# Patient Record
Sex: Male | Born: 1980 | Race: White | Hispanic: Yes | Marital: Single
Health system: Southern US, Academic
[De-identification: ages and names within clinical notes are randomized; demographics above are authoritative.]

## PROBLEM LIST (undated history)

## (undated) ENCOUNTER — Encounter

## (undated) ENCOUNTER — Telehealth

## (undated) ENCOUNTER — Encounter: Attending: Gastroenterology | Primary: Gastroenterology

## (undated) ENCOUNTER — Ambulatory Visit

## (undated) ENCOUNTER — Telehealth: Attending: Gastroenterology | Primary: Gastroenterology

## (undated) ENCOUNTER — Ambulatory Visit: Payer: MEDICARE | Attending: Gastroenterology | Primary: Gastroenterology

## (undated) ENCOUNTER — Ambulatory Visit: Attending: Audiologist | Primary: Audiologist

## (undated) ENCOUNTER — Other Ambulatory Visit

## (undated) ENCOUNTER — Ambulatory Visit: Payer: MEDICARE

## (undated) ENCOUNTER — Inpatient Hospital Stay

## (undated) ENCOUNTER — Ambulatory Visit: Attending: Physician Assistant | Primary: Physician Assistant

## (undated) DIAGNOSIS — K5090 Crohn's disease, unspecified, without complications: Secondary | ICD-10-CM

## (undated) DIAGNOSIS — G894 Chronic pain syndrome: Secondary | ICD-10-CM

## (undated) HISTORY — PX: ABDOMINAL SURGERY: SHX537

## (undated) HISTORY — DX: Chronic pain syndrome: G89.4

## (undated) HISTORY — PX: OTHER SURGICAL HISTORY: SHX169

---
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
## 2002-03-02 ENCOUNTER — Encounter: Payer: Self-pay | Admitting: *Deleted

## 2002-03-02 ENCOUNTER — Emergency Department (HOSPITAL_COMMUNITY): Admission: EM | Admit: 2002-03-02 | Discharge: 2002-03-02 | Payer: Self-pay | Admitting: *Deleted

## 2005-01-09 ENCOUNTER — Encounter: Admission: RE | Admit: 2005-01-09 | Discharge: 2005-01-09 | Payer: Self-pay | Admitting: Oncology

## 2005-01-09 ENCOUNTER — Ambulatory Visit (HOSPITAL_COMMUNITY): Payer: Self-pay | Admitting: Oncology

## 2005-01-09 ENCOUNTER — Encounter (HOSPITAL_COMMUNITY): Admission: RE | Admit: 2005-01-09 | Discharge: 2005-02-08 | Payer: Self-pay | Admitting: Oncology

## 2005-01-22 ENCOUNTER — Ambulatory Visit: Payer: Self-pay | Admitting: Internal Medicine

## 2005-01-29 ENCOUNTER — Ambulatory Visit: Payer: Self-pay | Admitting: Internal Medicine

## 2005-01-29 ENCOUNTER — Encounter: Payer: Self-pay | Admitting: Internal Medicine

## 2005-01-29 ENCOUNTER — Ambulatory Visit (HOSPITAL_COMMUNITY): Admission: RE | Admit: 2005-01-29 | Discharge: 2005-01-29 | Payer: Self-pay | Admitting: Internal Medicine

## 2005-01-30 ENCOUNTER — Ambulatory Visit (HOSPITAL_COMMUNITY): Admission: RE | Admit: 2005-01-30 | Discharge: 2005-01-30 | Payer: Self-pay | Admitting: Internal Medicine

## 2005-02-27 ENCOUNTER — Ambulatory Visit (HOSPITAL_COMMUNITY): Admission: RE | Admit: 2005-02-27 | Discharge: 2005-02-27 | Payer: Self-pay | Admitting: Internal Medicine

## 2005-03-05 ENCOUNTER — Ambulatory Visit: Payer: Self-pay | Admitting: Internal Medicine

## 2005-05-01 ENCOUNTER — Ambulatory Visit (HOSPITAL_COMMUNITY): Admission: RE | Admit: 2005-05-01 | Discharge: 2005-05-01 | Payer: Self-pay | Admitting: Internal Medicine

## 2005-05-01 ENCOUNTER — Ambulatory Visit: Payer: Self-pay | Admitting: Internal Medicine

## 2005-05-14 ENCOUNTER — Encounter (HOSPITAL_COMMUNITY): Admission: RE | Admit: 2005-05-14 | Discharge: 2005-06-13 | Payer: Self-pay | Admitting: Internal Medicine

## 2005-05-14 ENCOUNTER — Ambulatory Visit (HOSPITAL_COMMUNITY): Payer: Self-pay | Admitting: Internal Medicine

## 2005-05-22 ENCOUNTER — Ambulatory Visit: Payer: Self-pay | Admitting: Internal Medicine

## 2005-07-25 ENCOUNTER — Emergency Department (HOSPITAL_COMMUNITY): Admission: EM | Admit: 2005-07-25 | Discharge: 2005-07-25 | Payer: Self-pay | Admitting: Emergency Medicine

## 2005-07-26 ENCOUNTER — Encounter: Admission: RE | Admit: 2005-07-26 | Discharge: 2005-07-26 | Payer: Self-pay | Admitting: Gastroenterology

## 2005-08-06 ENCOUNTER — Encounter (HOSPITAL_COMMUNITY): Admission: RE | Admit: 2005-08-06 | Discharge: 2005-09-21 | Payer: Self-pay | Admitting: Gastroenterology

## 2009-09-29 ENCOUNTER — Emergency Department (HOSPITAL_COMMUNITY): Admission: EM | Admit: 2009-09-29 | Discharge: 2009-09-29 | Payer: Self-pay | Admitting: Family Medicine

## 2010-05-26 NOTE — Consult Note (Signed)
NAME:  Seth Foster, Seth Foster              ACCOUNT NO.:  [id number]   MEDICAL RECORD NO.:  [id number]          PATIENT TYPE:  AMB   LOCATION:                                FACILITY:  [facility]   PHYSICIAN:  R. Roetta Sessions, M.D. DATE OF BIRTH:  07-17-1980   DATE OF CONSULTATION:  01/22/2005  DATE OF DISCHARGE:                                   CONSULTATION   REASON FOR CONSULTATION:  Abdominal pain, rectal bleeding, weight loss.   HISTORY OF PRESENT ILLNESS:  Mr. Seth Foster is a 30 year old Caucasian  male sent over through the courtesy of Seth Foster to further evaluate the  above-mentioned symptoms.  Mr. Seth Foster says for many years he has had  intermittent postprandial abdominal discomforts, periumbilical and  epigastric in location, not necessarily related to meals, but often this is  the case.  He is chronically constipated, having 1 bowel movement daily to  every 3 to 4 days.  He is intermittently passing fresh blood per rectum with  stooling.  He has noted unintentional weight loss, approximately 20 pounds  over the past couple of months.  He has intermittent nausea and vomiting as  well.  He does not really have much in the way of typical reflux symptoms,  no odynophagia, no dysphagia.  He does clearly describe progressing early  satiety.   He saw Dr. Mariel Foster recently for leukocytosis, mild anemia, and elevated  platelets.  According to Seth Foster notes, etiology not clear, but  further workup was in process.  Through Dr. Lamar Foster office, accompanying  records indicate that he was checked with a celiac panel.  His gliadin  peptide antibody came back 26 which was weakly positive.  His endomysial  antibody was negative.  Unfortunately, transglutaminase antibody was not  reported.  Amylase and lipase were not elevated.  TSH was normal at 0.2387.  H. pylori serology came back 0.7, normal.  He has not had any imaging  studies of his GI tract.  In addition, his white count was  mildly elevated  at 12,000, hemoglobin 11.9, hematocrit 38.2.  His CHEM-20 was okay except  for slightly depressed glucose at 68.  All of his liver parameters were  normal.   Mr. Seth Foster tells me he smokes marijuana on a regular basis which relieves  the nausea, but he feels it just covers up the symptoms.   HIV testing per Seth Foster office came back negative per his report.  He drinks a good 4 to 5 cocktails every weekend.  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  He did this for a  prolonged period some 2 to 2-1/2 years ago but has stopped this behavior.  He denies any parenteral drug use. He has never had any GI surgery and has  never seen a gastroenterologist previously.   PAST MEDICAL HISTORY:  Significant for asthma and the above-mentioned  current symptoms.   PAST SURGICAL HISTORY:  Bilateral tympanostomy tubes.   CURRENT MEDICATIONS:  None.   ALLERGIES:  No known drug allergies.   FAMILY HISTORY:  Mother is alive and in good health.  Little is known about  his biological father. No obvious history of chronic GI or liver disease.   SOCIAL HISTORY:  The patient is single with no children.  He is employed  with the [employer].  He smokes 1/2 to 1 pack of cigarettes  per day.  Liquor is as outlined above.  Marijuana use as above.  Prior  history of nasal drug use as outlined above.   REVIEW OF SYSTEMS:  No recent chest pain or dyspnea on exertion.  No fever,  chills, weight loss as outlined above.  Otherwise as in GI Review of  Systems.   PHYSICAL EXAMINATION:  GENERAL:  19, 30 year old, bearded gentleman  resting comfortably.  VITAL SIGNS: Weight 135.5, height 5 feet 9 inches.  Temperature 97.8, blood  pressure 100/56, pulse 80.  SKIN: Warm and dry.  There is no jaundice, no continuous stigmata of chronic  liver disease.  HEENT:   No scleral icterus.  Conjunctivae are pink.  Oral cavity and  dentition in fair state of repair.  NECK:  JVD is not prominent.  No cervical adenopathy.  CHEST:  Lungs are clear to auscultation.  CARDIAC: Regular rate and rhythm without murmur, gallop, or rub.  ABDOMEN: Nondistended, positive bowel sounds, soft.  Minimal epigastric  tenderness to palpation.  No appreciable mass or organomegaly.  EXTREMITIES: No edema.  RECTAL: No external lesions, good sphincter tone. No mass in the rectal  vault.  There is stool in the rectal vault.  Mucus is seen, occult negative.   ADMITTING IMPRESSION:  Mr. Seth Foster is a 30 year old  gentleman with a  constellation of gastrointestinal symptoms including early satiety,  postprandial abdominal pain, hematochezia in the setting of more or less  chronic constipation.  He has lost a good 20 pounds unintentionally  recently.  He has a weakly positive antigliadin antibody.   He has some hematological issues being looked into by Dr. Mariel Foster.   Given his small stature and weight loss, certainly need to rule out both  celiac disease and Crohn's disease in this setting. Although chronic  constipation would run somewhat contrary to Crohn's disease, we certainly  need to rule out that entity.  He has some hematochezia which may well be  anorectal in origin, but will need investigation of his entire lower  gastrointestinal tract.   RECOMMENDATIONS:  1.  Will go ahead and plan to proceed with EGD with small-bowel biopsy as      well as colonoscopy in the very near future.  2.  Will go ahead and order transglutaminase antibody to wrap up his      serological workup for celiac disease.  3.  Potential risks, benefits, and alternatives of the above approach have      been discussed at some length with Mr.      Seth Foster.  His questions were answered.  He is agreeable to proceed as      soon as possible. 4.  Further recommendations to follow.   I would like  to thank Dr. Colette Foster for allowing me to see this  nice gentleman today.      Seth Foster, M.D.  Electronically Signed     RMR/MEDQ  D:  01/22/2005  T:  01/22/2005  Job:  [id number]   cc:   Seth Foster, M.D.  Fax: [phone]   Ladona Horns. Seth Sleet, MD  Fax: [phone]

## 2010-05-26 NOTE — Op Note (Signed)
NAME:  Seth Foster, PUTZIER              ACCOUNT NO.:  [id number]   MEDICAL RECORD NO.:  [id number]          PATIENT TYPE:  AMB   LOCATION:  DAY                           FACILITY:  [facility]   PHYSICIAN:  R. Roetta Sessions, M.D. DATE OF BIRTH:  [birth year]   DATE OF PROCEDURE:  01/29/2005  DATE OF DISCHARGE:                                 OPERATIVE REPORT   PROCEDURE:  Esophagogastroduodenoscopy with biopsy followed by a colonoscopy  with ileoscopy and biopsy.   ENDOSCOPIST:  Gerrit Friends. Rourk, M.D.   INDICATIONS FOR PROCEDURE:  A 30 year old gentleman with early satiety,  postprandial abdominal pain, hematochezia.  He really has not had any  diarrhea.  He has been loosing weight.  Weakly positive, anti __________  antibody.  I saw him in the office recently and a trans __________ antibody  through my office came back negative.  EGD and colonoscopy are now being  down.  This approach has been discussed with the patient at length.  The  potential risks, benefits, and alternatives have been reviewed; and  questions answered.  Patient is agreeable.  Please see the documentation in  the medical record.   PROCEDURE NOTE:  O2 saturation, blood pressure, pulse and respirations were  monitored throughout the entire procedure.   CONSCIOUS SEDATION:  Versed 7 mg IV, Demerol 125 mg IV in divided doses,   INSTRUMENT:  Olympus videochip system.   FINDINGS:  Examination of the tubular esophagus revealed normal appearing  esophagus.  The EG junction was easily traversed.   STOMACH:  The gastric cavity was empty.  It insufflated well with air.  A  thorough examination of the gastric mucosa including a retroflex view of the  proximal stomach and esophagogastric junction demonstrated a small hiatal  hernia and a couple of scattered antral erosions.  The pylorus was patent  and easily traversed.  Examination of the bulb, second, and third portion  revealed multiple 6-mm, deep craters beginning in the second  portion,  extending as far as I could see through the third portion of the duodenum.  Please see photos.  These were discrete deep ulcers.  Intervening mucosa  appeared more or less normal.   THERAPEUTIC/DIAGNOSTIC MANEUVERS:  The small-bowel ulcers were biopsied.  The mucosa of the small bowel was biopsied to screen for celiac disease as  well.  The patient tolerated the procedure well and was prepared for  colonoscopy.   A digital rectal exam revealed no abnormalities.   ENDOSCOPIC FINDINGS:  The prep was adequate; however, I did suction out over  two liters of fluid throughout the colon.   RECTUM:  Examination of the rectal mucosa including a retroflex view of the  anal verge revealed anal papillae and internal hemorrhoids, otherwise the  rectal mucosa appeared normal.   COLON:  The colonic mucosa was surveyed from the rectosigmoid junction  through the left transverse and right colon to the area of the appendiceal  orifice, ileocecal valve, and cecum.  These structures were well seen and  photographed for the record.  The terminal ileum was intubated to 10 cm.  From this level the scope was slowly withdrawn.  All previously mentioned  mucosal surfaces were again seen.  The colonic mucosa appeared normal.  The  terminal ileal mucosa was markedly abnormal with diffuse ulceration of the  terminal ileal mucosa, as far as I could see, with some stenosis of the  lumen, please see photos.  Multiple biopsies of the terminal ileal mucosa  were taken.  The mucosa was friable. The mucosa came off in chunks.  The  patient tolerated both procedures well was reacted in endoscopy.   EGD IMPRESSION:  1.  Normal esophagus.  2.  Small hiatal hernia.  3.  A couple of antral erosions.  4.  Otherwise normal stomach.  5.  Patent pylorus.  6.  Numerous discrete ulcers of the second and third portion of the duodenum      status post biopsy.   COLONOSCOPY FINDINGS:  1.  Anal papillae and  internal hemorrhoids, otherwise normal rectum.  2.  Normal appearing colonic mucosa.  3.  Markedly abnormal, diffusely ulcerated, terminal ileal mucosa status      post biopsy.   Today's findings are most certainly going to be representative of Crohn's  disease.   RECOMMENDATIONS:  1.  Will followup on path.  2.  Will go ahead and obtain a small bowel follow through as a baseline.  3.  As far as the small volume hematochezia is concerned I suspect that he      is more or less bleeding from      hemorrhoids and we will give him some Anusol AC suppositories 1 per      rectum at bedtime.  4.  We will hold off on CT or further studies until the path comes back, but      I suspect, as stated above, that he has Crohn's disease and this would      certainly be consistent with the clinical scenario.      [name], M.D.  Electronically Signed     RMR/MEDQ  D:  01/29/2005  T:  01/29/2005  Job:  [id number]   cc:   Ladona Horns. Mariel Sleet, MD  Fax: [phone]   Corrie Mckusick, M.D.  Fax: [phone]

## 2011-02-07 DIAGNOSIS — Z980 Intestinal bypass and anastomosis status: Secondary | ICD-10-CM | POA: Insufficient documentation

## 2011-03-03 DIAGNOSIS — K219 Gastro-esophageal reflux disease without esophagitis: Secondary | ICD-10-CM | POA: Insufficient documentation

## 2011-03-07 DIAGNOSIS — Z720 Tobacco use: Secondary | ICD-10-CM | POA: Insufficient documentation

## 2011-06-04 ENCOUNTER — Emergency Department (HOSPITAL_COMMUNITY)
Admission: EM | Admit: 2011-06-04 | Discharge: 2011-06-04 | Disposition: A | Discharge status: Other Acute Inpatient Hospital | Payer: Self-pay | Attending: Emergency Medicine | Admitting: Emergency Medicine

## 2011-06-04 ENCOUNTER — Encounter (HOSPITAL_COMMUNITY): Payer: Self-pay | Admitting: *Deleted

## 2011-06-04 ENCOUNTER — Emergency Department (HOSPITAL_COMMUNITY): Payer: Self-pay

## 2011-06-04 DIAGNOSIS — J45909 Unspecified asthma, uncomplicated: Secondary | ICD-10-CM | POA: Insufficient documentation

## 2011-06-04 DIAGNOSIS — R109 Unspecified abdominal pain: Secondary | ICD-10-CM | POA: Insufficient documentation

## 2011-06-04 DIAGNOSIS — K56600 Partial intestinal obstruction, unspecified as to cause: Secondary | ICD-10-CM

## 2011-06-04 DIAGNOSIS — K56609 Unspecified intestinal obstruction, unspecified as to partial versus complete obstruction: Secondary | ICD-10-CM | POA: Insufficient documentation

## 2011-06-04 DIAGNOSIS — K5090 Crohn's disease, unspecified, without complications: Secondary | ICD-10-CM | POA: Insufficient documentation

## 2011-06-04 HISTORY — DX: Crohn's disease, unspecified, without complications: K50.90

## 2011-06-04 LAB — CBC
Hemoglobin: 12.1 g/dL — ABNORMAL LOW (ref 13.0–17.0)
MCV: 84.5 fL (ref 78.0–100.0)
Platelets: 356 10*3/uL (ref 150–400)
RBC: 4.39 MIL/uL (ref 4.22–5.81)
WBC: 15.2 10*3/uL — ABNORMAL HIGH (ref 4.0–10.5)

## 2011-06-04 LAB — DIFFERENTIAL
Basophils Absolute: 0.1 10*3/uL (ref 0.0–0.1)
Basophils Relative: 1 % (ref 0–1)
Eosinophils Absolute: 1.7 10*3/uL — ABNORMAL HIGH (ref 0.0–0.7)
Monocytes Absolute: 0.7 10*3/uL (ref 0.1–1.0)
Neutro Abs: 11 10*3/uL — ABNORMAL HIGH (ref 1.7–7.7)

## 2011-06-04 LAB — BASIC METABOLIC PANEL
BUN: 8 mg/dL (ref 6–23)
GFR calc Af Amer: >90 mL/min (ref >90)
GFR calc non Af Amer: >90 mL/min (ref >90)
Glucose, Bld: 97 mg/dL (ref 70–99)
Potassium: 3.9 mEq/L (ref 3.5–5.1)
Sodium: 137 mEq/L (ref 135–145)

## 2011-06-04 LAB — URINALYSIS, ROUTINE W REFLEX MICROSCOPIC
Hgb urine dipstick: NEGATIVE
Ketones, ur: NEGATIVE mg/dL
Protein, ur: NEGATIVE mg/dL
Specific Gravity, Urine: 1.025 (ref 1.005–1.030)
Urobilinogen, UA: 0.2 mg/dL (ref 0.0–1.0)
pH: 6 (ref 5.0–8.0)

## 2011-06-04 MED ORDER — SODIUM CHLORIDE 0.9 % IV SOLN
INTRAVENOUS | Status: DC
  Administered 2011-06-04: 14:00:00 via INTRAVENOUS

## 2011-06-04 MED ORDER — NICOTINE 14 MG/24HR TD PT24
MEDICATED_PATCH | TRANSDERMAL | Status: AC
  Administered 2011-06-04: 14 mg
  Filled 2011-06-04: qty 1

## 2011-06-04 MED ORDER — HYDROMORPHONE HCL PF 2 MG/ML IJ SOLN
2.0000 mg | Freq: Once | INTRAMUSCULAR | Status: AC
  Administered 2011-06-04: 2 mg via INTRAVENOUS
  Filled 2011-06-04: qty 1

## 2011-06-04 MED ORDER — ONDANSETRON HCL 4 MG/2ML IJ SOLN
4.0000 mg | Freq: Once | INTRAMUSCULAR | Status: AC
  Administered 2011-06-04: 4 mg via INTRAVENOUS
  Filled 2011-06-04: qty 2

## 2011-06-04 MED ORDER — IOHEXOL 300 MG/ML  SOLN
100.0000 mL | Freq: Once | INTRAMUSCULAR | Status: AC | PRN
  Administered 2011-06-04: 100 mL via INTRAVENOUS

## 2011-06-04 NOTE — ED Notes (Signed)
edp in with pt 

## 2011-06-04 NOTE — ED Provider Notes (Cosign Needed)
History     CSN: [id number]  Arrival date & time 06/04/11  1158   First MD Initiated Contact with Patient 06/04/11 1324      Chief Complaint  Patient presents with  . Abdominal Pain    (Consider location/radiation/quality/duration/timing/severity/associated sxs/prior treatment) HPI Comments: Patient is a 31 year old man with a 10 year history of Crohn's disease. He had had bowel resection on April 27 had [facility] hospitals. He saw a surgeon 3 weeks ago in followup, and was doing well. About 3 days ago he developed cramping abdominal pain, mainly on the right midabdominal region, next to the site of a prior ileostomy. Has been one episode of blood in the stools, round 2 AM last night. He denies vomiting. He therefore seeks evaluation.  Patient is a 31 y.o. male presenting with abdominal pain.  Abdominal Pain The primary symptoms of the illness include abdominal pain. The primary symptoms of the illness do not include fever. The current episode started more than 2 days ago. The onset of the illness was gradual. The problem has been gradually worsening.  Associated with: Long-standing history of Crohn's disease. The patient has not had a change in bowel habit. Risk factors for an acute abdominal problem include a history of abdominal surgery. Additional symptoms associated with the illness include anorexia. Symptoms associated with the illness do not include chills. Significant associated medical issues include inflammatory bowel disease.    Past Medical History  Diagnosis Date  . Crohn disease   . Asthma     Past Surgical History  Procedure Date  . Abdominal surgery   . Surgery for crohns     History reviewed. No pertinent family history.  History  Substance Use Topics  . Smoking status: Current Everyday Smoker  . Smokeless tobacco: Not on file  . Alcohol Use: Yes      Review of Systems  Constitutional: Negative for fever and chills.  HENT: Negative.   Eyes: Negative.     Respiratory: Negative.   Cardiovascular: Negative.   Gastrointestinal: Positive for abdominal pain, blood in stool and anorexia.  Genitourinary: Negative.   Musculoskeletal: Negative.   Skin: Negative.   Neurological: Negative.   Psychiatric/Behavioral: Negative.     Allergies  Benadryl; Sulfa antibiotics; and Tylenol  Home Medications  No current outpatient prescriptions on file.  BP 118/82  Pulse 79  Temp(Src) 98.2 F (36.8 C) (Oral)  Resp 20  Ht 5\' 11"  (1.803 m)  Wt 140 lb (63.504 kg)  BMI 19.53 kg/m2  SpO2 100%  Physical Exam  Nursing note and vitals reviewed. Constitutional: He is oriented to person, place, and time. He appears well-developed and well-nourished. Distressed: in moderate distress with abdominal pain.  HENT:  Head: Normocephalic and atraumatic.  Right Ear: External ear normal.  Left Ear: External ear normal.  Mouth/Throat: Oropharynx is clear and moist.  Eyes: Conjunctivae and EOM are normal. Pupils are equal, round, and reactive to light.  Neck: Normal range of motion. Neck supple.  Cardiovascular: Normal rate, regular rhythm and normal heart sounds.   Pulmonary/Chest: Effort normal and breath sounds normal.  Abdominal:       He has a recent midline abdominal incision wound is well-healed, and also a right lower quadrant scar, apparently the scar from his ileostomy. He localizes his pain to the right lower quadrant. There is no mass or point of tenderness. Bowel sounds are diminished.  Genitourinary:       Rectal exam shows no mass or tenderness. Stool is faintly  positive for blood by Hemoccult testing.  Musculoskeletal: Normal range of motion. He exhibits no edema and no tenderness.  Neurological: He is alert and oriented to person, place, and time.       No sensory or motor deficit.  Skin: Skin is warm and dry.  Psychiatric: He has a normal mood and affect. His behavior is normal.    ED Course  Procedures (including critical care time)  Labs  Reviewed  CBC - Abnormal; Notable for the following:    WBC 15.2 (*)    Hemoglobin 12.1 (*)    HCT 37.1 (*)    All other components within normal limits  DIFFERENTIAL - Abnormal; Notable for the following:    Neutro Abs 11.0 (*)    Eosinophils Relative 11 (*)    Eosinophils Absolute 1.7 (*)    All other components within normal limits  BASIC METABOLIC PANEL   1:61 PM Patient was seen and had physical examination. Laboratory tests and CT x-ray of the abdomen and pelvis were ordered. IV fluids, IV pain and nausea medicines were ordered.  3:04 PM Results for orders placed during the hospital encounter of 06/04/11  CBC      Component Value Range   WBC 15.2 (*) 4.0 - 10.5 (K/uL)   RBC 4.39  4.22 - 5.81 (MIL/uL)   Hemoglobin 12.1 (*) 13.0 - 17.0 (g/dL)   HCT 09.6 (*) 04.5 - 52.0 (%)   MCV 84.5  78.0 - 100.0 (fL)   MCH 27.6  26.0 - 34.0 (pg)   MCHC 32.6  30.0 - 36.0 (g/dL)   RDW 40.9  81.1 - 91.4 (%)   Platelets 356  150 - 400 (K/uL)  DIFFERENTIAL      Component Value Range   Neutrophils Relative 72  43 - 77 (%)   Neutro Abs 11.0 (*) 1.7 - 7.7 (K/uL)   Lymphocytes Relative 12  12 - 46 (%)   Lymphs Abs 1.7  0.7 - 4.0 (K/uL)   Monocytes Relative 5  3 - 12 (%)   Monocytes Absolute 0.7  0.1 - 1.0 (K/uL)   Eosinophils Relative 11 (*) 0 - 5 (%)   Eosinophils Absolute 1.7 (*) 0.0 - 0.7 (K/uL)   Basophils Relative 1  0 - 1 (%)   Basophils Absolute 0.1  0.0 - 0.1 (K/uL)  BASIC METABOLIC PANEL      Component Value Range   Sodium 137  135 - 145 (mEq/L)   Potassium 3.9  3.5 - 5.1 (mEq/L)   Chloride 105  96 - 112 (mEq/L)   CO2 24  19 - 32 (mEq/L)   Glucose, Bld 97  70 - 99 (mg/dL)   BUN 8  6 - 23 (mg/dL)   Creatinine, Ser 7.82  0.50 - 1.35 (mg/dL)   Calcium 9.2  8.4 - 95.6 (mg/dL)   GFR calc non Af Amer >90  >90 (mL/min)   GFR calc Af Amer >90  >90 (mL/min)   3:04 PM WBC elevated at 15,200 with normal diff.  Chemistries WNL.  Awaiting results of CT abd/pelvis.  4:39 PM CT of the  abdomen and pelvis showed inflammatory changes of the distal ileum and rectum, consistent with Crohn's disease. Will call his gastroenterologist at [facility] to discuss case.  5:20 PM Case discussed with Aneta Mins, M.D., who accepts pt in transfer to a floor bed at [facility].now  5:44 PM In process of transferring pt to [facility].   6:14 PM Pt has a bed  at [facility].  Dr. Epifania Gore is accepting physician.  1. Partial small bowel obstruction   2. Crohn's disease           Carleene Cooper III, MD 06/04/11 [id number]

## 2011-06-04 NOTE — ED Notes (Signed)
abd pain and rectal bleeding,  Surgery 4/22,for Chrons,   Reversal of ostomy At [facility]

## 2011-06-04 NOTE — ED Notes (Signed)
Called [facility] for transport to [facility]. 

## 2011-06-05 MED FILL — Hydromorphone HCl Preservative Free (PF) Inj 2 MG/ML: INTRAMUSCULAR | Qty: 1 | Status: AC

## 2011-06-05 MED FILL — Ondansetron HCl Inj 4 MG/2ML (2 MG/ML): INTRAMUSCULAR | Qty: 2 | Status: AC

## 2011-12-25 ENCOUNTER — Other Ambulatory Visit (HOSPITAL_COMMUNITY): Payer: Self-pay | Admitting: Anesthesiology

## 2011-12-25 DIAGNOSIS — R209 Unspecified disturbances of skin sensation: Secondary | ICD-10-CM

## 2011-12-25 DIAGNOSIS — M79609 Pain in unspecified limb: Secondary | ICD-10-CM

## 2011-12-25 DIAGNOSIS — S335XXA Sprain of ligaments of lumbar spine, initial encounter: Secondary | ICD-10-CM

## 2011-12-25 DIAGNOSIS — M5137 Other intervertebral disc degeneration, lumbosacral region: Secondary | ICD-10-CM

## 2011-12-27 ENCOUNTER — Ambulatory Visit (HOSPITAL_COMMUNITY): Payer: Self-pay

## 2012-04-10 DIAGNOSIS — K5090 Crohn's disease, unspecified, without complications: Secondary | ICD-10-CM | POA: Insufficient documentation

## 2012-06-10 DIAGNOSIS — R0602 Shortness of breath: Secondary | ICD-10-CM | POA: Diagnosis not present

## 2012-06-10 DIAGNOSIS — R05 Cough: Secondary | ICD-10-CM | POA: Diagnosis not present

## 2012-06-10 DIAGNOSIS — R918 Other nonspecific abnormal finding of lung field: Secondary | ICD-10-CM | POA: Diagnosis not present

## 2012-06-10 DIAGNOSIS — R079 Chest pain, unspecified: Secondary | ICD-10-CM | POA: Diagnosis not present

## 2012-07-22 DIAGNOSIS — J189 Pneumonia, unspecified organism: Secondary | ICD-10-CM | POA: Diagnosis not present

## 2012-08-19 DIAGNOSIS — K5090 Crohn's disease, unspecified, without complications: Secondary | ICD-10-CM | POA: Diagnosis not present

## 2012-12-16 DIAGNOSIS — F329 Major depressive disorder, single episode, unspecified: Secondary | ICD-10-CM | POA: Insufficient documentation

## 2012-12-22 DIAGNOSIS — K5090 Crohn's disease, unspecified, without complications: Secondary | ICD-10-CM | POA: Diagnosis not present

## 2013-01-05 DIAGNOSIS — G8929 Other chronic pain: Secondary | ICD-10-CM | POA: Insufficient documentation

## 2013-02-20 DIAGNOSIS — K5000 Crohn's disease of small intestine without complications: Secondary | ICD-10-CM | POA: Diagnosis not present

## 2013-02-20 DIAGNOSIS — K5090 Crohn's disease, unspecified, without complications: Secondary | ICD-10-CM | POA: Diagnosis not present

## 2013-05-13 DIAGNOSIS — K5090 Crohn's disease, unspecified, without complications: Secondary | ICD-10-CM | POA: Diagnosis not present

## 2013-05-18 DIAGNOSIS — G8929 Other chronic pain: Secondary | ICD-10-CM | POA: Diagnosis not present

## 2013-05-18 DIAGNOSIS — IMO0002 Reserved for concepts with insufficient information to code with codable children: Secondary | ICD-10-CM | POA: Diagnosis not present

## 2013-05-18 DIAGNOSIS — K5090 Crohn's disease, unspecified, without complications: Secondary | ICD-10-CM | POA: Diagnosis not present

## 2013-06-18 DIAGNOSIS — G8929 Other chronic pain: Secondary | ICD-10-CM | POA: Diagnosis not present

## 2013-06-18 DIAGNOSIS — IMO0002 Reserved for concepts with insufficient information to code with codable children: Secondary | ICD-10-CM | POA: Diagnosis not present

## 2013-06-18 DIAGNOSIS — K5090 Crohn's disease, unspecified, without complications: Secondary | ICD-10-CM | POA: Diagnosis not present

## 2013-07-25 DIAGNOSIS — IMO0002 Reserved for concepts with insufficient information to code with codable children: Secondary | ICD-10-CM | POA: Diagnosis not present

## 2013-07-25 DIAGNOSIS — G8929 Other chronic pain: Secondary | ICD-10-CM | POA: Diagnosis not present

## 2013-07-25 DIAGNOSIS — L258 Unspecified contact dermatitis due to other agents: Secondary | ICD-10-CM | POA: Diagnosis not present

## 2013-08-21 DIAGNOSIS — Z79899 Other long term (current) drug therapy: Secondary | ICD-10-CM | POA: Diagnosis not present

## 2013-08-21 DIAGNOSIS — K5090 Crohn's disease, unspecified, without complications: Secondary | ICD-10-CM | POA: Diagnosis not present

## 2013-08-24 DIAGNOSIS — G8929 Other chronic pain: Secondary | ICD-10-CM | POA: Diagnosis not present

## 2013-08-24 DIAGNOSIS — Z681 Body mass index (BMI) 19 or less, adult: Secondary | ICD-10-CM | POA: Diagnosis not present

## 2013-08-24 DIAGNOSIS — K5000 Crohn's disease of small intestine without complications: Secondary | ICD-10-CM | POA: Diagnosis not present

## 2013-09-24 DIAGNOSIS — G8929 Other chronic pain: Secondary | ICD-10-CM | POA: Diagnosis not present

## 2013-09-24 DIAGNOSIS — IMO0002 Reserved for concepts with insufficient information to code with codable children: Secondary | ICD-10-CM | POA: Diagnosis not present

## 2013-10-13 DIAGNOSIS — K50912 Crohn's disease, unspecified, with intestinal obstruction: Secondary | ICD-10-CM | POA: Diagnosis not present

## 2013-10-26 DIAGNOSIS — Z6821 Body mass index (BMI) 21.0-21.9, adult: Secondary | ICD-10-CM | POA: Diagnosis not present

## 2013-10-26 DIAGNOSIS — G894 Chronic pain syndrome: Secondary | ICD-10-CM | POA: Diagnosis not present

## 2013-11-24 DIAGNOSIS — Z6821 Body mass index (BMI) 21.0-21.9, adult: Secondary | ICD-10-CM | POA: Diagnosis not present

## 2013-11-24 DIAGNOSIS — G894 Chronic pain syndrome: Secondary | ICD-10-CM | POA: Diagnosis not present

## 2013-12-24 DIAGNOSIS — Z6821 Body mass index (BMI) 21.0-21.9, adult: Secondary | ICD-10-CM | POA: Diagnosis not present

## 2013-12-24 DIAGNOSIS — G894 Chronic pain syndrome: Secondary | ICD-10-CM | POA: Diagnosis not present

## 2013-12-24 DIAGNOSIS — K649 Unspecified hemorrhoids: Secondary | ICD-10-CM | POA: Diagnosis not present

## 2014-01-21 DIAGNOSIS — R109 Unspecified abdominal pain: Secondary | ICD-10-CM | POA: Diagnosis not present

## 2014-01-21 DIAGNOSIS — Z6821 Body mass index (BMI) 21.0-21.9, adult: Secondary | ICD-10-CM | POA: Diagnosis not present

## 2014-01-21 DIAGNOSIS — G894 Chronic pain syndrome: Secondary | ICD-10-CM | POA: Diagnosis not present

## 2014-02-16 DIAGNOSIS — K5010 Crohn's disease of large intestine without complications: Secondary | ICD-10-CM | POA: Diagnosis not present

## 2014-02-23 DIAGNOSIS — G894 Chronic pain syndrome: Secondary | ICD-10-CM | POA: Diagnosis not present

## 2014-02-23 DIAGNOSIS — Z6820 Body mass index (BMI) 20.0-20.9, adult: Secondary | ICD-10-CM | POA: Diagnosis not present

## 2014-03-29 DIAGNOSIS — Z681 Body mass index (BMI) 19 or less, adult: Secondary | ICD-10-CM | POA: Diagnosis not present

## 2014-03-29 DIAGNOSIS — G894 Chronic pain syndrome: Secondary | ICD-10-CM | POA: Diagnosis not present

## 2014-04-20 DIAGNOSIS — R51 Headache: Secondary | ICD-10-CM | POA: Diagnosis not present

## 2014-04-20 DIAGNOSIS — K50819 Crohn's disease of both small and large intestine with unspecified complications: Secondary | ICD-10-CM | POA: Diagnosis not present

## 2014-04-27 DIAGNOSIS — Z681 Body mass index (BMI) 19 or less, adult: Secondary | ICD-10-CM | POA: Diagnosis not present

## 2014-04-27 DIAGNOSIS — L0291 Cutaneous abscess, unspecified: Secondary | ICD-10-CM | POA: Diagnosis not present

## 2014-04-27 DIAGNOSIS — G894 Chronic pain syndrome: Secondary | ICD-10-CM | POA: Diagnosis not present

## 2014-04-27 DIAGNOSIS — K5000 Crohn's disease of small intestine without complications: Secondary | ICD-10-CM | POA: Diagnosis not present

## 2014-04-27 DIAGNOSIS — M069 Rheumatoid arthritis, unspecified: Secondary | ICD-10-CM | POA: Diagnosis not present

## 2014-04-27 DIAGNOSIS — K5090 Crohn's disease, unspecified, without complications: Secondary | ICD-10-CM | POA: Diagnosis not present

## 2014-05-11 DIAGNOSIS — K50912 Crohn's disease, unspecified, with intestinal obstruction: Secondary | ICD-10-CM | POA: Diagnosis not present

## 2014-06-15 DIAGNOSIS — K5090 Crohn's disease, unspecified, without complications: Secondary | ICD-10-CM | POA: Diagnosis not present

## 2014-06-15 DIAGNOSIS — L0591 Pilonidal cyst without abscess: Secondary | ICD-10-CM | POA: Diagnosis not present

## 2014-07-23 DIAGNOSIS — Z1389 Encounter for screening for other disorder: Secondary | ICD-10-CM | POA: Diagnosis not present

## 2014-07-23 DIAGNOSIS — G894 Chronic pain syndrome: Secondary | ICD-10-CM | POA: Diagnosis not present

## 2014-07-23 DIAGNOSIS — Z681 Body mass index (BMI) 19 or less, adult: Secondary | ICD-10-CM | POA: Diagnosis not present

## 2014-08-23 DIAGNOSIS — Z1389 Encounter for screening for other disorder: Secondary | ICD-10-CM | POA: Diagnosis not present

## 2014-08-23 DIAGNOSIS — K5080 Crohn's disease of both small and large intestine without complications: Secondary | ICD-10-CM | POA: Diagnosis not present

## 2014-08-23 DIAGNOSIS — G894 Chronic pain syndrome: Secondary | ICD-10-CM | POA: Diagnosis not present

## 2014-08-23 DIAGNOSIS — Z681 Body mass index (BMI) 19 or less, adult: Secondary | ICD-10-CM | POA: Diagnosis not present

## 2014-08-23 DIAGNOSIS — L0501 Pilonidal cyst with abscess: Secondary | ICD-10-CM | POA: Diagnosis not present

## 2014-09-30 ENCOUNTER — Emergency Department (HOSPITAL_COMMUNITY)
Admission: EM | Admit: 2014-09-30 | Discharge: 2014-09-30 | Disposition: A | Discharge status: Home / Self Care | Payer: Medicare Other | Attending: Emergency Medicine | Admitting: Emergency Medicine

## 2014-09-30 ENCOUNTER — Encounter (HOSPITAL_COMMUNITY): Payer: Self-pay | Admitting: Emergency Medicine

## 2014-09-30 DIAGNOSIS — R2231 Localized swelling, mass and lump, right upper limb: Secondary | ICD-10-CM | POA: Diagnosis not present

## 2014-09-30 DIAGNOSIS — Z8719 Personal history of other diseases of the digestive system: Secondary | ICD-10-CM | POA: Diagnosis not present

## 2014-09-30 DIAGNOSIS — F1110 Opioid abuse, uncomplicated: Secondary | ICD-10-CM

## 2014-09-30 DIAGNOSIS — Z79899 Other long term (current) drug therapy: Secondary | ICD-10-CM | POA: Insufficient documentation

## 2014-09-30 DIAGNOSIS — M7989 Other specified soft tissue disorders: Secondary | ICD-10-CM

## 2014-09-30 DIAGNOSIS — Z720 Tobacco use: Secondary | ICD-10-CM | POA: Insufficient documentation

## 2014-09-30 DIAGNOSIS — J45909 Unspecified asthma, uncomplicated: Secondary | ICD-10-CM | POA: Insufficient documentation

## 2014-09-30 MED ORDER — DEXAMETHASONE 4 MG PO TABS
12.0000 mg | ORAL_TABLET | Freq: Once | ORAL | Status: AC
  Administered 2014-09-30: 12 mg via ORAL
  Filled 2014-09-30: qty 3

## 2014-09-30 NOTE — ED Notes (Signed)
Pt states he noticed that his R hand started swelling approx. 1 hour ago. Redness and swelling noted. Alert and oriented.

## 2014-09-30 NOTE — Discharge Instructions (Signed)
Do not use heroin!  Keep your hand elevated. Apply ice for 20 minutes at a time, 3-4 times a day.

## 2014-09-30 NOTE — ED Provider Notes (Signed)
CSN: [id number]     Arrival date & time 09/30/14  0118 History  This chart was scribed for Delora Fuel, MD by Randa Evens, ED Scribe. This patient was seen in room WA23/WA23 and the patient's care was started at 1:36 AM.     Chief Complaint  Patient presents with  . Hand Problem   The history is provided by the patient. No language interpreter was used.   HPI Comments: Seth Foster is a 34 y.o. male who presents to the Emergency Department complaining of new sudden right hand swelling onset 1 hours PTA. Pt states that he injected heroin into his right wrist flexor side, and soon after that, his hand began swelling. Pt does report that the hand is itchy. Pt doesn't report any weakness or tingling. Pt does report a benadryl allergy.   Past Medical History  Diagnosis Date  . Crohn disease   . Asthma    Past Surgical History  Procedure Laterality Date  . Abdominal surgery    . Surgery for crohns     History reviewed. No pertinent family history. Social History  Substance Use Topics  . Smoking status: Current Every Day Smoker  . Smokeless tobacco: None  . Alcohol Use: Yes    Review of Systems  Musculoskeletal: Positive for joint swelling.  Neurological: Negative for weakness.  All other systems reviewed and are negative.    Allergies  Benadryl; Broccoli; Scallops; Sulfa antibiotics; and Tylenol  Home Medications   Prior to Admission medications   Medication Sig Start Date End Date Taking? Authorizing Provider  fexofenadine-pseudoephedrine (ALLEGRA-D 24) 180-240 MG per 24 hr tablet Take 1 tablet by mouth 2 (two) times daily.    Historical Provider, MD  folic acid (FOLVITE) 1 MG tablet Take 1 mg by mouth daily. Takes everyday except on Saturday when he takes Methotrexate.    Historical Provider, MD  methotrexate (RHEUMATREX) 2.5 MG tablet Take 25 mg by mouth once a week. Takes on Saturday    Historical Provider, MD  Multiple Vitamin (MULITIVITAMIN WITH MINERALS) TABS  Take 1 tablet by mouth daily.    Historical Provider, MD  naphazoline (CLEAR EYES) 0.012 % ophthalmic solution Place 1 drop into both eyes daily as needed. Allergies    Historical Provider, MD   BP 114/70 mmHg  Pulse 75  Temp(Src) 98.3 F (36.8 C) (Oral)  Resp 16  SpO2 99%   Physical Exam  Constitutional: He is oriented to person, place, and time. He appears well-developed and well-nourished. No distress.  HENT:  Head: Normocephalic and atraumatic.  Eyes: EOM are normal. Pupils are equal, round, and reactive to light.  Neck: Normal range of motion. Neck supple. No JVD present.  Cardiovascular: Normal rate, regular rhythm and normal heart sounds.   No murmur heard. Pulmonary/Chest: Effort normal and breath sounds normal. He has no wheezes. He has no rales. He exhibits no tenderness.  Abdominal: Soft. Bowel sounds are normal. He exhibits no distension and no mass. There is no tenderness.  Musculoskeletal: Normal range of motion.  right hand has moderate swelling diffusely, finger are warm with normal sensation and prompt cap refill.   Lymphadenopathy:    He has no cervical adenopathy.  Neurological: He is alert and oriented to person, place, and time. No cranial nerve deficit. He exhibits normal muscle tone. Coordination normal.  Skin: Skin is warm and dry. No rash noted.  Psychiatric: He has a normal mood and affect. His behavior is normal. Thought content normal.  Nursing  note and vitals reviewed.   ED Course  Procedures (including critical care time) DIAGNOSTIC STUDIES: Oxygen Saturation is 99% on RA, normal by my interpretation.    COORDINATION OF CARE: 1:42 AM-Discussed treatment plan with pt at bedside and pt agreed to plan.    MDM   Final diagnoses:  Swelling of right hand  Heroin abuse      Right hand swelling following injection of heroin. I suspect that this is an allergic reaction. Unfortunately, he has an allergy to diphenhydramine. There is no evidence of  vascular compromise. He is given a dose of dexamethasone and advised Tylenol 1 local treatment such as ice and elevation. Advised not to inject drugs intravenously, and, in fact, advised not to use illicit drugs whatsoever.   I personally performed the services described in this documentation, which was scribed in my presence. The recorded information has been reviewed and is accurate.       Delora Fuel, MD 74/16/38 [id number]

## 2014-12-27 ENCOUNTER — Ambulatory Visit (INDEPENDENT_AMBULATORY_CARE_PROVIDER_SITE_OTHER): Payer: Medicare Other | Admitting: Internal Medicine

## 2014-12-27 VITALS — BP 118/68 | HR 66 | Temp 98.8°F | Resp 16 | Ht 69.0 in | Wt 146.0 lb

## 2014-12-27 DIAGNOSIS — G8929 Other chronic pain: Secondary | ICD-10-CM

## 2014-12-27 DIAGNOSIS — L0501 Pilonidal cyst with abscess: Secondary | ICD-10-CM | POA: Insufficient documentation

## 2014-12-27 DIAGNOSIS — G4700 Insomnia, unspecified: Secondary | ICD-10-CM | POA: Insufficient documentation

## 2014-12-27 DIAGNOSIS — R109 Unspecified abdominal pain: Secondary | ICD-10-CM | POA: Diagnosis not present

## 2014-12-27 DIAGNOSIS — K50918 Crohn's disease, unspecified, with other complication: Secondary | ICD-10-CM | POA: Diagnosis not present

## 2014-12-27 DIAGNOSIS — J4520 Mild intermittent asthma, uncomplicated: Secondary | ICD-10-CM

## 2014-12-27 DIAGNOSIS — J45909 Unspecified asthma, uncomplicated: Secondary | ICD-10-CM | POA: Insufficient documentation

## 2014-12-27 MED ORDER — ALBUTEROL SULFATE HFA 108 (90 BASE) MCG/ACT IN AERS: 2.0000 | INHALATION_SPRAY | Freq: Four times a day (QID) | RESPIRATORY_TRACT | Status: AC | PRN

## 2014-12-27 MED ORDER — PROMETHAZINE HCL 25 MG PO TABS: 25.0000 mg | ORAL_TABLET | Freq: Three times a day (TID) | ORAL | Status: DC

## 2014-12-27 MED ORDER — HYDROCORTISONE ACE-PRAMOXINE 1-1 % RE FOAM: 1.0000 | Freq: Two times a day (BID) | RECTAL | Status: DC

## 2014-12-27 MED ORDER — OXYCODONE HCL 30 MG PO TABS: 30.0000 mg | ORAL_TABLET | ORAL | Status: DC | PRN

## 2014-12-27 MED ORDER — HYDROCORTISONE ACETATE 30 MG RE SUPP: 1.0000 | Freq: Two times a day (BID) | RECTAL | Status: DC

## 2014-12-27 MED ORDER — DOXYCYCLINE HYCLATE 100 MG PO TABS: 100.0000 mg | ORAL_TABLET | Freq: Two times a day (BID) | ORAL | Status: DC

## 2014-12-27 MED ORDER — TEMAZEPAM 30 MG PO CAPS: 30.0000 mg | ORAL_CAPSULE | Freq: Every day | ORAL | Status: DC

## 2014-12-27 NOTE — Progress Notes (Signed)
Subjective:  By signing my name below, I, Raven Small, attest that this documentation has been prepared under the direction and in the presence of [name], MD.  Electronically Signed: Thea Alken, ED Scribe. 12/27/2014. 5:19 PM.   Patient ID: Seth Foster, male    DOB: 1980/01/25, 34 y.o.   MRN: [id number]  HPI initial umfc ov--has been trying to get new PCP since his MD in [place] retired and patient moved down here but can't see anyone til January and is out of meds in 1 day  Chief Complaint  Patient presents with  . Crohn's Disease  . Medication Refills   HPI Comments: Seth Foster is a 34 y.o. male with hx of Crohn's disease who presents to the [facility] for a medication refill. Pt states he is transferring primary care from [employer] to [place]. He is followed by Dr. Kennith Gain in [place] for his Crohn's disease for years. He has hx of abdominal surgery stating he's had a total of 5 ft removed from his intestines w/ ostomy and later reversal. Now stable on suppositories and injections of Stelara q 8 weeks  Current problems include: Patient Active Problem List   Diagnosis Date Noted  . Chronic abdominal pain 01/05/2013    Priority: Medium  . Crohn's disease of intestine (HCC)dx 2007 04/10/2012    Priority: Medium  . Insomnia 12/27/2014  . Reactive airway disease 12/27/2014  . Pilonidal abscess 12/27/2014  . Major depressive disorder ([place]) 12/16/2012  . Current tobacco use 03/07/2011  . Acid reflux 03/03/2011  . Intestinal bypass or anastomosis status 02/07/2011    He is requesting a refill of doxycycline as he has a flair of pilonidal abscess which is draining and for which he will see [facility] later this week.  He also needs Restoril, promethazine, hydrocortisone acetate suppository, oxycodone 30mg  and proventil. His oxycodone was being prescribed by his PCP in [place] Dr [employer].   Review of [place] shows meds only from  him!  Depression screen PHQ 2/9 12/27/2014  Decreased Interest 0  Down, Depressed, Hopeless 0  PHQ - 2 Score 0  depr stable now   Past Medical History  Diagnosis Date  . Crohn disease ([place])   . Asthma    Past Surgical History  Procedure Laterality Date  . Abdominal surgery    . Surgery for crohns     Allergies  Allergen Reactions  . Benadryl [Diphenhydramine Hcl] Anaphylaxis  . Broccoli [Brassica Oleracea Italica] Other (See Comments)    Causes terrible gas.   [name] [Shellfish Allergy] Diarrhea and Nausea And Vomiting  . Sulfa Antibiotics Hives  . Tylenol [Acetaminophen] Nausea And Vomiting   Prior to Admission medications   Medication Sig Start Date End Date Taking? Authorizing Provider  albuterol (PROVENTIL HFA;VENTOLIN HFA) 108 (90 BASE) MCG/ACT inhaler Inhale into the lungs every 6 (six) hours as needed for wheezing or shortness of breath. Out-- seldom needs  Yes Historical Provider, MD  folic acid (FOLVITE) 1 MG tablet Take 1 mg by mouth daily. Takes everyday except on Saturday when he takes Methotrexate.   Yes Historical Provider, MD  HYDROCORTISONE ACE, RECTAL, 30 MG SUPP Place rectally. One or  Yes Historical Provider, MD  hydrocortisone-pramoxine [facility]) rectal foam Place 1 applicator rectally 2 (two) times daily.  the other depending on availability  Yes Historical Provider, MD  oxycodone (ROXICODONE) 30 MG immediate release tablet Take 30 mg by mouth every 4 (four) hours as needed for  pain.   Yes Historical Provider, MD  promethazine (PHENERGAN) 25 MG tablet Take 25 mg by mouth 3 (three) times daily. prn  Yes Historical Provider, MD  temazepam (RESTORIL) 30 MG capsule Take 30 mg by mouth daily.   Yes Historical Provider, MD  methotrexate (RHEUMATREX) 2.5 MG tablet Take 25 mg by mouth once a week. Reported on 12/27/2014 Now off this due to liver issues with med   Historical Provider, MD   Social History   Social History  . Marital Status: Single     Spouse Name: N/A  . Number of Children: N/A  . Years of Education: N/A   Occupational History  . Not on file.   Social History Main Topics  . Smoking status: Current Every Day Smoker  . Smokeless tobacco: Not on file  . Alcohol Use: Yes  . Drug Use: Yes    Special: Marijuana  . Sexual Activity: Not on file   Other Topics Concern  . Not on file   Social History Narrative   Review of Systems  Constitutional: Negative for fever, chills, fatigue and unexpected weight change.  Respiratory: Negative for shortness of breath and wheezing.   Gastrointestinal: Negative for abdominal pain, diarrhea and blood in stool.  Musculoskeletal: Negative for arthralgias.  Skin: Negative for rash.       Objective:   Physical Exam  Constitutional: He is oriented to person, place, and time. He appears well-developed and well-nourished. No distress.  HENT:  Head: Normocephalic and atraumatic.  Eyes: Conjunctivae and EOM are normal. Pupils are equal, round, and reactive to light.  Neck: Neck supple.  Cardiovascular: Normal rate.   Pulmonary/Chest: Effort normal.  Neurological: He is alert and oriented to person, place, and time.  Skin: Skin is warm and dry.  Psychiatric: He has a normal mood and affect. His behavior is normal.  Nursing note and vitals reviewed.   Filed Vitals:   12/27/14 1717  BP: 118/68  Pulse: 66  Temp: 98.8 F (37.1 C)  TempSrc: Oral  Resp: 16  Height: 5\' 9"  (1.753 m)  Weight: 146 lb (66.225 kg)  SpO2: 99%   Assessment & Plan:  Crohn's disease of intestine, other complication (HCC)  Chronic abdominal pain  Insomnia  Reactive airway disease, mild intermittent, uncomplicated  Pilonidal abscess  Heroin use--see ED encounter 09/30/14(I did not see this until reviewing chart late night)  Meds ordered this encounter  Medications  . promethazine (PHENERGAN) 25 MG tablet    Sig: Take 25 mg by mouth 3 (three) times daily.  . temazepam (RESTORIL) 30 MG capsule     Sig: Take 30 mg by mouth daily.  Marland Kitchen oxycodone (ROXICODONE) 30 MG immediate release tablet    Sig: Take 30 mg by mouth every 4 (four) hours as needed for pain.  . hydrocortisone-pramoxine (PROCTOFOAM-HC) rectal foam    Sig: Place 1 applicator rectally 2 (two) times daily.  Marland Kitchen HYDROCORTISONE ACE, RECTAL, 30 MG SUPP    Sig: Place rectally.  Marland Kitchen albuterol (PROVENTIL HFA;VENTOLIN HFA) 108 (90 BASE) MCG/ACT inhaler    Sig: Inhale into the lungs every 6 (six) hours as needed for wheezing or shortness of breath.   Fu at 104 in 30d for CPE to establish care  Will need old records Will need eventual pain management referral, and will need further investigation into illegal substance use once the extent of all his medical issues are firmly established  I have completed the patient encounter in its entirety as documented by the  scribe, with editing by me where necessary. Demareon Coldwell P. Laney Pastor, M.D.

## 2015-01-21 ENCOUNTER — Ambulatory Visit (INDEPENDENT_AMBULATORY_CARE_PROVIDER_SITE_OTHER): Payer: Medicare Other | Admitting: Internal Medicine

## 2015-01-21 VITALS — BP 104/62 | HR 75 | Temp 98.0°F | Resp 16 | Ht 69.0 in | Wt 139.4 lb

## 2015-01-21 DIAGNOSIS — L0591 Pilonidal cyst without abscess: Secondary | ICD-10-CM | POA: Diagnosis not present

## 2015-01-21 DIAGNOSIS — G8929 Other chronic pain: Secondary | ICD-10-CM

## 2015-01-21 DIAGNOSIS — R109 Unspecified abdominal pain: Secondary | ICD-10-CM

## 2015-01-21 DIAGNOSIS — K50918 Crohn's disease, unspecified, with other complication: Secondary | ICD-10-CM | POA: Diagnosis not present

## 2015-01-21 MED ORDER — PROMETHAZINE HCL 25 MG PO TABS: 25.0000 mg | ORAL_TABLET | Freq: Three times a day (TID) | ORAL | Status: DC

## 2015-01-21 MED ORDER — DOXYCYCLINE HYCLATE 100 MG PO TABS: 100.0000 mg | ORAL_TABLET | Freq: Two times a day (BID) | ORAL | Status: DC

## 2015-01-21 MED ORDER — OXYCODONE HCL 30 MG PO TABS: 30.0000 mg | ORAL_TABLET | ORAL | Status: DC | PRN

## 2015-01-21 NOTE — Progress Notes (Signed)
Subjective:    Patient ID: Seth Foster, male    DOB: 23-Jan-1980, 35 y.o.   MRN: [id number] By signing my name below, I, Judithe Modest, attest that this documentation has been prepared under the direction and in the presence of [name], MD. Electronically Signed: Judithe Modest, ER Scribe. 01/21/2015. 6:17 PM.  Chief Complaint  Patient presents with  . Medication Refill    doxycycline,oxycodone,promethazine,temazepam   HPI HPI Comments: Seth Foster is a 35 y.o. male with a past hx of Crohn's disease( s/p ileocolectomy x2 in 2012 and 2013) followed by Dr Suanne Marker at [facility], onset 2007,(MEDICATIONS:  Include 1. Stelara 90 mg subcutaneously every 8 weeks. Working 2. Folic acid 1 mg p.o. q.day, except on the day he takes methotrexate.  3. Promethazine as needed.  4. imodium  5. Temazepam 30 mg q.h.s.)   He returns to [facility]  for a medication refill. He states he has passed blood two days since his last visit--relatively good for him since he couldn't afford the rectal steroids as prescribed..   At his first [place] last mo he needed meds, was set up for appt at 104 office for New PCP with f/u with past records BUT somehow we never called him with appt so he's almost out!!(His oxycodone was being prescribed by his PCP in [place] [employer] who has retired.Review of [facility] shows meds only from him!)))  He states he gets nausea every time he eats. He has to take a phenergan every time he eats in order to keep food down. He states he has frequent gum infections, and has to take doxycycline regularly(esp when P cyst flares). He also has intermittent toe infections.  Hx chronic problems with pilonidal cyst--now bothering him again after incision at [facility] dr   Launa Flight 05/2014--he would like ref to [place] here now that he lives here  Patient Active Problem List   Diagnosis Date Noted  . Chronic abdominal pain 01/05/2013    Priority: Medium  . Crohn's disease of  intestine ([place]) 04/10/2012    Priority: Medium  . Insomnia 12/27/2014  . Reactive airway disease 12/27/2014  . Pilonidal abscess 12/27/2014  . Major depressive disorder ([place]) 12/16/2012  . Current tobacco use 03/07/2011  . Acid reflux 03/03/2011  . Intestinal bypass or anastomosis status 02/07/2011    Current outpatient prescriptions:  .  albuterol (PROVENTIL HFA;VENTOLIN HFA) 108 (90 BASE) MCG/ACT inhaler, Inhale 2 puffs into the lungs every 6 (six) hours as needed for wheezing or shortness of breath., Disp: 1 Inhaler, Rfl: 1 .  fexofenadine-pseudoephedrine (ALLEGRA-D 24) 180-240 MG per 24 hr tablet, Take 1 tablet by mouth 2 (two) times daily. Reported on 12/27/2014, Disp: , Rfl:  .  Multiple Vitamin (MULITIVITAMIN WITH MINERALS) TABS, Take 1 tablet by mouth daily., Disp: , Rfl:  .  oxycodone (ROXICODONE) 30 MG immediate release tablet, Take 1 tablet (30 mg total) by mouth every 4 (four) hours as needed for pain. Written by me as dr Gerarda Fraction no longer available. For 01/16/15 or after., Disp: 150 tablet, Rfl: 0 .  promethazine (PHENERGAN) 25 MG tablet, Take 1 tablet (25 mg total) by mouth 3 (three) times daily., Disp: 90 tablet, Rfl: 2 .  temazepam (RESTORIL) 30 MG capsule, Take 1 capsule (30 mg total) by mouth daily., Disp: 30 capsule, Rfl: 1 .  doxycycline (VIBRA-TABS) 100 MG tablet, Take 1 tablet (100 mg total) by mouth 2 (two) times daily., Disp: 20 tablet, Rfl: 1 .  DOXYCYCLINE PO, Take by mouth. Reported on 01/21/2015, Disp: , Rfl:  .  folic acid (FOLVITE) 1 MG tablet, Take 1 mg by mouth daily. Reported on 01/21/2015, Disp: , Rfl:  .  HYDROCORTISONE ACE, RECTAL, 30 MG SUPP, Place 1 suppository (30 mg total) rectally 2 (two) times daily. As directed (Patient not taking: Reported on 01/21/2015), Disp: 28 each, Rfl: 1 .  hydrocortisone-pramoxine (PROCTOFOAM-HC) rectal foam, Place 1 applicator rectally 2 (two) times daily. (Patient not taking: Reported on 01/21/2015), Disp: 10 g, Rfl: 1 .   methotrexate (RHEUMATREX) 2.5 MG tablet, Take 25 mg by mouth once a week. Reported on 01/21/2015, Disp: , Rfl:  .  naphazoline (CLEAR EYES) 0.012 % ophthalmic solution, Place 1 drop into both eyes daily as needed. Reported on 01/21/2015, Disp: , Rfl:  .  ustekinumab (STELARA) 90 MG/ML SOSY injection, Reported on 01/21/2015, Disp: , Rfl:    Allergies  Allergen Reactions  . Benadryl [Diphenhydramine Hcl] Anaphylaxis  . Broccoli [Brassica Oleracea Italica] Other (See Comments)    Causes terrible gas.   [name] [Shellfish Allergy] Diarrhea and Nausea And Vomiting  . Sulfa Antibiotics Hives  . Tylenol [Acetaminophen] Nausea And Vomiting    Review of Systems  Constitutional: Negative for fever and chills.  Gastrointestinal: Positive for abdominal pain and blood in stool.  Musculoskeletal: Negative for gait problem.  Skin: Negative for color change and wound.       Objective:  BP 104/62 mmHg  Pulse 75  Temp(Src) 98 F (36.7 C) (Oral)  Resp 16  Ht 5\' 9"  (1.753 m)  Wt 139 lb 6.4 oz (63.231 kg)  BMI 20.58 kg/m2  SpO2 98%  Physical Exam  Constitutional: He is oriented to person, place, and time. He appears well-developed and well-nourished. No distress.  HENT:  Head: Normocephalic and atraumatic.  Eyes: Pupils are equal, round, and reactive to light.  Neck: Neck supple.  Cardiovascular: Normal rate.   Pulmonary/Chest: Effort normal. No respiratory distress.  Musculoskeletal: Normal range of motion.  Neurological: He is alert and oriented to person, place, and time. Coordination normal.  Skin: Skin is warm and dry. He is not diaphoretic.  Psychiatric: He has a normal mood and affect. His behavior is normal.  Nursing note and vitals reviewed.     Assessment & Plan:  Crohn's disease of intestine, other complication ([place])  Chronic abdominal pain  Pilonidal cyst - Plan: Ambulatory referral to General Surgery  Meds ordered this encounter  Medications  . oxycodone (ROXICODONE)  30 MG immediate release tablet    Sig: Take 1 tablet (30 mg total) by mouth every 4 (four) hours as needed for pain. Written by me as dr Gerarda Fraction no longer available. For 01/16/15 or after.    Dispense:  150 tablet    Refill:  0  . promethazine (PHENERGAN) 25 MG tablet    Sig: Take 1 tablet (25 mg total) by mouth 3 (three) times daily.    Dispense:  90 tablet    Refill:  2  . doxycycline (VIBRA-TABS) 100 MG tablet    Sig: Take 1 tablet (100 mg total) by mouth 2 (two) times daily.    Dispense:  20 tablet    Refill:  1   appt set at 104 for 1 mo --need to see if he has f/u with Dr Carlos Levering --did he get GS eval P Cyst --does he have other medical issues from a PCP perspective --do we need to set up chronic pain treatment in [place]??  I have completed the patient encounter in its entirety as documented by the scribe, with editing by me where necessary. Paetyn Pietrzak P. Laney Pastor, M.D.

## 2015-01-27 ENCOUNTER — Telehealth: Payer: Self-pay

## 2015-01-27 ENCOUNTER — Ambulatory Visit (INDEPENDENT_AMBULATORY_CARE_PROVIDER_SITE_OTHER): Payer: Medicare Other | Admitting: Emergency Medicine

## 2015-01-27 VITALS — BP 110/78 | HR 77 | Temp 98.0°F | Resp 20 | Ht 70.47 in | Wt 140.8 lb

## 2015-01-27 DIAGNOSIS — G8929 Other chronic pain: Secondary | ICD-10-CM | POA: Diagnosis not present

## 2015-01-27 DIAGNOSIS — R109 Unspecified abdominal pain: Secondary | ICD-10-CM | POA: Diagnosis not present

## 2015-01-27 MED ORDER — OXYCODONE HCL 30 MG PO TABS: 30.0000 mg | ORAL_TABLET | ORAL | Status: DC | PRN

## 2015-01-27 NOTE — Patient Instructions (Signed)
Crohn Disease Crohn disease is a long-lasting (chronic) disease that affects your gastrointestinal (GI) tract. It often causes irritation and swelling (inflammation) in your small intestine and the beginning of your large intestine. However, it can affect any part of your GI tract. Crohn disease is part of a group of illnesses that are known as inflammatory bowel disease (IBD). Crohn disease may start slowly and get worse over time. Symptoms may come and go. They may also disappear for months or even years at a time (remission). CAUSES The exact cause of Crohn disease is not known. It may be a response that causes your body's defense system (immune system) to mistakenly attack healthy cells and tissues (autoimmune response). Your genes and your environment may also play a role. RISK FACTORS You may be at greater risk for Crohn disease if you:  Have other family members with Crohn disease or another IBD.  Use any tobacco products, including cigarettes, chewing tobacco, or electronic cigarettes.  Are in your 20s.  Have Eastern European ancestry. SIGNS AND SYMPTOMS The main signs and symptoms of Crohn disease involve your GI tract. These include:  Diarrhea.  Rectal bleeding.  An urgent need to move your bowels.  The feeling that you are not finished having a bowel movement.  Abdominal pain or cramping.  Constipation. General signs and symptoms of Crohn disease may also include:  Unexplained weight loss.  Fatigue.  Fever.  Nausea.  Loss of appetite.  Joint pain  Changes in vision.  Red bumps on your skin. DIAGNOSIS Your health care provider may suspect Crohn disease based on your symptoms and your medical history. Your health care provider will do a physical exam. You may need to see a health care provider who specializes in diseases of the digestive tract (gastroenterologist). You may also have tests to help your health care providers make a diagnosis. These may  include:  Blood tests.  Stool sample tests.  Imaging tests, such as X-rays and CT scans.  Tests to examine the inside of your intestines using a long, flexible tube that has a light and a camera on the end (endoscopy or colonoscopy).  A procedure to take tissue samples from inside your bowel (biopsy) to be examined under a microscope. TREATMENT  There is no cure for Crohn disease. Treatment will focus on managing your symptoms. Crohn disease affects each person differently. Your treatment may include:  Resting your bowels. Drinking only clear liquids or getting nutrition through an IV for a period of time gives your bowels a chance to heal because they are not passing stools.  Medicines. These may be used alone or in combination (combination therapy). These may include antibiotic medicines. You may be given medicines that help to:  Reduce inflammation.  Control your immune system activity.  Fight infections.  Relieve cramps and prevent diarrhea.  Control your pain.  Surgery. You may need surgery if:  Medicines and other treatments are no longer working.  You develop complications from severe Crohn disease.  A section of your intestine becomes so damaged that it needs to be removed. HOME CARE INSTRUCTIONS  Take medicines only as directed by your health care provider.  If you were prescribed an antibiotic medicine, finish it all even if you start to feel better.  Keep all follow-up visits as directed by your health care provider. This is important.  Talk with your health care provider about changing your diet. This may help your symptoms. Your health care provide may recommend changes, such   as:  Drinking more fluids.  Avoiding milk and other foods that contain lactose.  Eating a low-fat diet.  Avoiding high-fiber foods, such as popcorn and nuts.  Avoiding carbonated beverages, such as soda.  Eating smaller meals more often rather than eating large  meals.  Keeping a food diary to identify foods that make your symptoms better or worse.  Do not use any tobacco products, including cigarettes, chewing tobacco, or electronic cigarettes. If you need help quitting, ask your health care provider.  Limit alcohol intake to no more than 1 drink per day for nonpregnant women and 2 drinks per day for men. One drink equals 12 ounces of beer, 5 ounces of wine, or 1 ounces of hard liquor.  Exercise daily or as directed by your health care provider. SEEK MEDICAL CARE IF:  You have diarrhea, abdominal cramps, and other gastrointestinal problems that are present almost all of the time.  Your symptoms do not improve with treatment.  You continue to lose weight.  You develop a rash or sores on your skin.  You develop eye problems.  You have a fever.   Your symptoms get worse.  You develop new symptoms. SEEK IMMEDIATE MEDICAL CARE IF:  You have bloody diarrhea.  You develop severe abdominal pain.  You cannot pass stools.   This information is not intended to replace advice given to you by your health care provider. Make sure you discuss any questions you have with your health care provider.   Document Released: 10/04/2004 Document Revised: 01/15/2014 Document Reviewed: 08/12/2013 Elsevier Interactive Patient Education 2016 [employer]  
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
## 2015-01-27 NOTE — Telephone Encounter (Signed)
error 

## 2015-02-05 ENCOUNTER — Ambulatory Visit (INDEPENDENT_AMBULATORY_CARE_PROVIDER_SITE_OTHER): Payer: Medicare Other | Admitting: Internal Medicine

## 2015-02-05 VITALS — BP 110/72 | HR 67 | Temp 98.8°F | Resp 18 | Ht 71.0 in | Wt 146.4 lb

## 2015-02-05 DIAGNOSIS — K50918 Crohn's disease, unspecified, with other complication: Secondary | ICD-10-CM | POA: Diagnosis not present

## 2015-02-05 DIAGNOSIS — R109 Unspecified abdominal pain: Secondary | ICD-10-CM | POA: Diagnosis not present

## 2015-02-05 DIAGNOSIS — G8929 Other chronic pain: Secondary | ICD-10-CM | POA: Diagnosis not present

## 2015-02-05 MED ORDER — OXYCODONE HCL 30 MG PO TABS: 30.0000 mg | ORAL_TABLET | ORAL | Status: DC | PRN
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
## 2015-02-15 ENCOUNTER — Ambulatory Visit: Payer: Medicare Other | Admitting: Family Medicine

## 2015-03-05 ENCOUNTER — Ambulatory Visit (INDEPENDENT_AMBULATORY_CARE_PROVIDER_SITE_OTHER): Payer: Medicare Other | Admitting: Internal Medicine

## 2015-03-05 VITALS — BP 140/80 | HR 62 | Temp 97.7°F | Resp 19 | Ht 71.0 in | Wt 139.0 lb

## 2015-03-05 DIAGNOSIS — J4520 Mild intermittent asthma, uncomplicated: Secondary | ICD-10-CM

## 2015-03-05 DIAGNOSIS — J988 Other specified respiratory disorders: Secondary | ICD-10-CM

## 2015-03-05 DIAGNOSIS — R109 Unspecified abdominal pain: Secondary | ICD-10-CM | POA: Diagnosis not present

## 2015-03-05 DIAGNOSIS — J22 Unspecified acute lower respiratory infection: Secondary | ICD-10-CM

## 2015-03-05 DIAGNOSIS — K50918 Crohn's disease, unspecified, with other complication: Secondary | ICD-10-CM

## 2015-03-05 DIAGNOSIS — G4700 Insomnia, unspecified: Secondary | ICD-10-CM

## 2015-03-05 DIAGNOSIS — G8929 Other chronic pain: Secondary | ICD-10-CM

## 2015-03-05 MED ORDER — AZITHROMYCIN 500 MG PO TABS: 500.0000 mg | ORAL_TABLET | Freq: Every day | ORAL | Status: DC

## 2015-03-05 MED ORDER — PROMETHAZINE HCL 25 MG PO TABS: 25.0000 mg | ORAL_TABLET | Freq: Three times a day (TID) | ORAL | Status: DC

## 2015-03-05 MED ORDER — OXYCODONE HCL 30 MG PO TABS: 30.0000 mg | ORAL_TABLET | ORAL | Status: DC | PRN

## 2015-03-05 MED ORDER — PREDNISONE 20 MG PO TABS: ORAL_TABLET | ORAL | Status: DC

## 2015-03-05 MED ORDER — TEMAZEPAM 30 MG PO CAPS: 30.0000 mg | ORAL_CAPSULE | Freq: Every day | ORAL | Status: DC
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
## 2015-03-05 NOTE — Patient Instructions (Signed)
Dentists [place]: 1)[employer] 3)[facility] ---Marye Round Simones ---Lucianne Lei Adornetto
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
## 2015-03-14 ENCOUNTER — Telehealth: Payer: Self-pay

## 2015-03-14 NOTE — Telephone Encounter (Signed)
Pt LM on my Vm when I was out of the office last week and advised a PA is needed for restoril. He stated that ins told him that we needed to write/call ins and let them know that pt has tried/failed belsomra in the past. Called pharm and got pt's ins info, [employer] ID # [id number]. [phone].

## 2015-03-15 NOTE — Telephone Encounter (Signed)
I had tried several times to do PA yesterday. covermymeds did not recognize pt and directed me to call [employer] whose system was down yesterday afternoon. I called back this morning and completed the PA on the phone. It has gone for review (pt has failed Belsomra, but not trazodone). We will be notified in 2-3 days. Pending.

## 2015-03-16 NOTE — Telephone Encounter (Signed)
Ok to call in trazadone 25mg  at bedtime #30 AND remind patient of the time and date of his upcoming pain eval at [facility]

## 2015-03-16 NOTE — Telephone Encounter (Signed)
PA was denied because pt has not tried BOTH trazodone and belsomra (he has only tried belsomra previously). Ins requires pt to also try/fail trazodone before they will consider covering temazepam. Dr Laney Pastor, do you want to Rx trazodone for pt to try?

## 2015-03-17 MED ORDER — TRAZODONE HCL 50 MG PO TABS: 25.0000 mg | ORAL_TABLET | Freq: Every evening | ORAL | Status: DC | PRN

## 2015-03-17 NOTE — Telephone Encounter (Signed)
Trazodone could not be ordered in a 25 mg tablet. I had to send in Rx as 50 mg, take 1/2 tab Qhs prn, #15. Called pt who reported that he started out taking trazodone 4-5 yrs ago. It was effective for about a month and then it stopped working. Then he was put on Ambien 5 mg. He also tried another medication he can't remember the name of and then [place]. The temazepam is the only sleep aid he has used that has remained effective. I will re-do PA with this new info. Pt stated he is aware of the appt next week and will know tomorrow if he is able to make that appt or if he has to go out of town with work. I advised pt that it is VERY difficult to get appts with Pain clinics and they are not very tolerant of missing appts, etc. I encouraged him to make that appt if at all possible. Also reminded pt of appt w/Dr Laney Pastor on 3/15.  Started PA again on covermymeds. LM for pt CB to report whether he has tried the 15 mg of temazepam, and Rozerem previously.

## 2015-03-17 NOTE — Progress Notes (Signed)
Left message with Dr Roderic Palau Hansen's RN to return my call. Office # [phone].

## 2015-03-17 NOTE — Progress Notes (Signed)
Dr Ephriam Knuckles RN called back and reported that they have not seen the pt since June 2016. He was receiving treatments there by injection and she has tried to get in touch with him repeatedly by phone and has even sent a letter. He does not have any appt scheduled either.

## 2015-03-20 ENCOUNTER — Telehealth: Payer: Self-pay

## 2015-03-20 NOTE — Telephone Encounter (Signed)
Patient called to let Pamala Hurry know that he picked up his Restoril prescription with a discount coupon.  He said Pamala Hurry had been trying to get his insurance to cover the Restoril, and in the mean time, he was given Trazadone.  He said he did use the Trazadone for a few days, but it was not effective.  CB#: [phone]

## 2015-03-23 ENCOUNTER — Encounter: Payer: Self-pay | Admitting: Internal Medicine

## 2015-03-23 ENCOUNTER — Ambulatory Visit (INDEPENDENT_AMBULATORY_CARE_PROVIDER_SITE_OTHER): Payer: Medicare Other | Admitting: Internal Medicine

## 2015-03-23 VITALS — BP 113/74 | HR 89 | Temp 98.6°F | Resp 16 | Ht 71.0 in | Wt 149.0 lb

## 2015-03-23 DIAGNOSIS — Z720 Tobacco use: Secondary | ICD-10-CM

## 2015-03-23 DIAGNOSIS — R109 Unspecified abdominal pain: Secondary | ICD-10-CM

## 2015-03-23 DIAGNOSIS — G8929 Other chronic pain: Secondary | ICD-10-CM | POA: Diagnosis not present

## 2015-03-23 DIAGNOSIS — K50918 Crohn's disease, unspecified, with other complication: Secondary | ICD-10-CM | POA: Diagnosis not present

## 2015-03-23 DIAGNOSIS — J4520 Mild intermittent asthma, uncomplicated: Secondary | ICD-10-CM

## 2015-03-23 MED ORDER — OXYCODONE HCL 30 MG PO TABS: 30.0000 mg | ORAL_TABLET | ORAL | Status: DC | PRN

## 2015-03-23 NOTE — Telephone Encounter (Signed)
Noted  

## 2015-03-23 NOTE — Telephone Encounter (Signed)
PA still pending, but pt CB to report he got the Rx using a savings card.

## 2015-03-23 NOTE — Progress Notes (Signed)
   Subjective:    Patient ID: Seth Foster, male    DOB: Jul 06, 1980, 35 y.o.   MRN: [id number]  HPI follow-up for chronic abdominal pain secondary to Crohn's disease, with past history of major depression and reactive airway disease. This is a relatively tragic case. He lost his arrangement for pain medications from his primary care provider Seth Foster, although he says this was because he moved [place] and was in need of new affiliation. He indicated his last visit that he had seen Seth Foster his GI specialist at [facility] when in fact they haven't seen him in [place] since last August or before that. This probably means he's not taking medication as he says that he is-Stelara.  Smoker ppd to 1/2ppd--admits using this to treat his anxiety and his abdominal pain with Crohn's for many years.  Family issues are very big problem for him. He has been estranged from his parents until just the last few weeks when the had to go home and ask for forgiveness and a new beginning.  Lots of stress all his life--he has never had a pattern of doing well in any situation. Significant anxiety. Significant phases of depression.  He has responded to Restoril for his insomnia  Review of Systems His lower respiratory infection with reactive airway disease responded to treatment at his last visit    Objective:   Physical Exam BP 113/74 mmHg  Pulse 89  Temp(Src) 98.6 F (37 C)  Resp 16  Ht 5\' 11"  (1.803 m)  Wt 149 lb (67.586 kg)  BMI 20.79 kg/m2 HEENT clear Heart regular Lungs are now clear without wheezing on forced expiration       Assessment & Plan:  Chronic abdominal pain--We will need to help with pain management to decide how much of this pain is related to his chronic abdominal disease versus his chronic psychological issues He will need referral to psychiatry at some point and we will discuss that at his next OV  Crohn's disease of intestine, other complication ([place])  Current  tobacco use--cont to decrease  Reactive airway disease, mild intermittent, uncomplicated--no need for further medications at this point Meds ordered this encounter  Medications  . oxycodone (ROXICODONE) 30 MG immediate release tablet    Sig: Take 1 tablet (30 mg total) by mouth every 4 (four) hours as needed for pain. Use up to 5 times a day.To cover 04/02/15 thru 04/13/15    Dispense:  60 tablet    Refill:  0   See initial presentation here 12/27/2014. We have been providing stopgap medication. He has failed follow through with finding a PCP. He missed his arranged appointment for pain management and indicates that they rescheduled him for the first week of April. [facility]) He has failed to follow-up with GI which he promises he will do this week with a phone call. He obviously needs more psychological help than he is getting, so he indicates he is much improved with reunion with his parents.  I have given him enough pain medicine to follow his most recent prescription until the time he sees [facility] He will follow-up with [facility] gastroenterology He is asked to locate a primary care provider as I retire in 2 months and I have never agreed to be his primary care provider but only to provide stopgap medication until he can find resources in our community

## 2015-04-11 ENCOUNTER — Ambulatory Visit (INDEPENDENT_AMBULATORY_CARE_PROVIDER_SITE_OTHER): Payer: Medicare Other | Admitting: Internal Medicine

## 2015-04-11 VITALS — BP 132/80 | HR 98 | Temp 98.5°F | Resp 18 | Ht 71.0 in | Wt 146.0 lb

## 2015-04-11 DIAGNOSIS — G8929 Other chronic pain: Secondary | ICD-10-CM

## 2015-04-11 DIAGNOSIS — K50918 Crohn's disease, unspecified, with other complication: Secondary | ICD-10-CM | POA: Diagnosis not present

## 2015-04-11 DIAGNOSIS — R109 Unspecified abdominal pain: Secondary | ICD-10-CM

## 2015-04-11 DIAGNOSIS — Z720 Tobacco use: Secondary | ICD-10-CM

## 2015-04-11 NOTE — Progress Notes (Signed)
   Subjective:    Patient ID: Seth Foster, male    DOB: [birth year], 35 y.o.   MRN: [id number] By signing my name below, I, Zola Button, attest that this documentation has been prepared under the direction and in the presence of [name], MD.  Electronically Signed: Zola Button, Medical Scribe. 04/11/2015. 7:04 PM.  HPI HPI Comments: Seth Foster is a 35 y.o. male with a history of depression, Crohn's disease and chronic abdominal pain who presents to the [facility] for a follow-up. Patient was able to get an appointment with [facility] GI in about 2 weeks;  His mother is taking a more active role in his health and will accompany him to the appointment at [facility] and at his [facility] appt.. His parents live in [place] and note recent reconciliation.  Patient would like a refill of his pain medications until he can get into [facility]. He will find out tomorrow when he will be able to get an appointment and call with date.  Patient plans to come back this weekend with his mother because his mother wants to meet me.  See 03/05/15 ov for RAD/LRI--better but hasn't stopped smoking so still mild sxt RAD   Review of Systems [place]    Objective:   Physical Exam  Constitutional: He is oriented to person, place, and time. He appears well-developed and well-nourished. No distress.  HENT:  Head: Normocephalic and atraumatic.  Eyes: Pupils are equal, round, and reactive to light.  Neck: Neck supple.  Cardiovascular: Normal rate.   Pulmonary/Chest: Effort normal.  Neurological: He is alert and oriented to person, place, and time. No cranial nerve deficit.  Skin: Skin is warm and dry. No rash noted.  Psychiatric: He has a normal mood and affect. His behavior is normal.  Nursing note and vitals reviewed. BP 132/80 mmHg  Pulse 98  Temp(Src) 98.5 F (36.9 C) (Oral)  Resp 18  Ht 5\' 11"  (1.803 m)  Wt 146 lb (66.225 kg)  BMI 20.37 kg/m2  SpO2 94%     Assessment  & Plan:  Chronic abdominal pain  Crohn's disease of intestine, other complication ([place])  Current tobacco use  Call with appt date and I'll leave rx for him til then Will need to identify PCP as well.  I have completed the patient encounter in its entirety as documented by the scribe, with editing by me where necessary. Morganne Haile P. Laney Pastor, M.D.

## 2015-04-12 ENCOUNTER — Telehealth: Payer: Self-pay

## 2015-04-12 NOTE — Telephone Encounter (Signed)
Patient is calling to let Dr. Laney Pastor know that he called [facility] several times and he hasn't heard anything back. Patient wants to know what to do. Please advise! [phone]

## 2015-04-12 NOTE — Telephone Encounter (Signed)
Referral Notes     Type Date User   General 04/01/2015 3:38 PM Audelia Hives R        Note   Pt was a no show for his appointment with [facility]                       Type Date User   General 03/14/2015 10:01 AM Audelia Hives R        Summary   Auto: Referral message        Note   ----- Message -----    From: Tye Savoy    Sent: 03/14/2015 10:00 AM     To: Leandrew Koyanagi, MD      Pt has an appointment with [facility] on 03-21-15 @ 3 pm

## 2015-04-13 ENCOUNTER — Telehealth: Payer: Self-pay

## 2015-04-13 MED ORDER — OXYCODONE HCL 30 MG PO TABS: 30.0000 mg | ORAL_TABLET | ORAL | Status: DC | PRN

## 2015-04-13 NOTE — Telephone Encounter (Signed)
Needs Rx for Oxycodone 30 mg. He had to reschedule and they have not called him with a date yet. I called [facility] and they state they have no record of calls and he has no-show twice. He now has to pay a 300 fee for missing both appts. I advised Angel at [facility] to call him to see if he will accept appt.

## 2015-04-13 NOTE — Telephone Encounter (Signed)
Pt called in about his prescription. He wants to know if Dr. Laney Pastor is going to write the rx or not? Please call patient when script is ready.

## 2015-04-13 NOTE — Telephone Encounter (Signed)
This is not what he told me This will be last rx he gets from [place] unless he shows an appt date and we call to confirm

## 2015-04-14 NOTE — Telephone Encounter (Signed)
Please review

## 2015-04-14 NOTE — Telephone Encounter (Signed)
This was written yesterday for the patient - but this will be the last one per Dr Marlaine Hind note.

## 2015-04-15 ENCOUNTER — Other Ambulatory Visit: Payer: Self-pay | Admitting: Internal Medicine

## 2015-04-15 MED ORDER — OXYCODONE HCL 30 MG PO TABS: 30.0000 mg | ORAL_TABLET | ORAL | Status: DC | PRN

## 2015-04-15 NOTE — Telephone Encounter (Signed)
SPoke with pt, and advised him Dr. Ninfa Meeker message.

## 2015-05-19 ENCOUNTER — Ambulatory Visit (INDEPENDENT_AMBULATORY_CARE_PROVIDER_SITE_OTHER): Payer: Medicare Other | Admitting: Family Medicine

## 2015-05-19 VITALS — BP 114/70 | HR 87 | Temp 98.2°F | Resp 17 | Ht 71.0 in | Wt 141.0 lb

## 2015-05-19 DIAGNOSIS — Z5181 Encounter for therapeutic drug level monitoring: Secondary | ICD-10-CM

## 2015-05-19 DIAGNOSIS — Z79891 Long term (current) use of opiate analgesic: Secondary | ICD-10-CM

## 2015-05-19 DIAGNOSIS — K50118 Crohn's disease of large intestine with other complication: Secondary | ICD-10-CM | POA: Diagnosis not present

## 2015-05-19 DIAGNOSIS — F11288 Opioid dependence with other opioid-induced disorder: Secondary | ICD-10-CM

## 2015-05-19 DIAGNOSIS — F411 Generalized anxiety disorder: Secondary | ICD-10-CM | POA: Diagnosis not present

## 2015-05-19 MED ORDER — OXYCODONE HCL 20 MG PO TABS: 1.0000 | ORAL_TABLET | Freq: Every day | ORAL | Status: DC

## 2015-05-19 NOTE — Progress Notes (Signed)
Subjective:    Patient ID: Seth Foster, male    DOB: Oct 29, 1980, 35 y.o.   MRN: [id number]  05/19/2015  Medication Refill and Referral   HPI This 35 y.o. male presents for evaluation for GI referral to local GI specialist.  Was previously seeing [facility] GI specialist in past.  Cannot afford transportation to [facility].  Tried calling a couple of GI specialists for Crohn's disease.  S/p surgical resection; has a nouvo ileum.  Last GI visit couple months ago.  Was taking Stelarin injections.  Does not have transportation; have had disagreements; relationship with mother has become toxic.  Must avoid mother.    Chronic pain syndrome: takes pain medication for abdominal pain; also has rheumatoid arthritis. Awakens with horrible abdominal pain; must have a bowel movement immediately; always suffer with lower abdominal pain.  Will take oxoycodone 30mg  upon awakening.  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  With last rx, filled 150 tablets; able to allow them to last longer than 30 days. Has been taking narcotics for two years now; has gotten tolerant to it.  Several years ago, there were some stool transplant therapies.  Interested in other forms of therapy.  Trying to control with diet.  Gas pain is horrible.  Takes during the day as needed; then will awaken with horrible cramps so will need another pill.  For a while pain got under control.  Narcotics started by surgeon in 2007 after first surgery at [facility].  Started on 40mg  oxycodone and then 10mg  Percocet PRN.  Transferred to [facility].  Has been at this steady dose for 2-3 years.  Has been using 120-150 tablets per month.  Wants to get off of medication.  Has tried to stop before.  Off on pain medication for three months; then needed surgery again. Diagnosed at age 89.  Refused pain medication for four years prior to starting medication. Previous Fentanyl patches.  Heroine use in chart on 09/30/14.  No previous GI physician  locally.  No rheumatologist.  Dr. Carlos Levering diagnosed with RA six years ago.  Once or twice years joints seize up.    Per [place] registry,  oxycodone 30mg  #150 filled at [facility] in [place] on 04/15/15;  04/13/15 Oxycodone 30 #[facility]  03/25/15 Oxycodone 30mg  #60 filled Laney Pastor 03/20/15 Temazepam 30mg  #30 no refills 2/25 oxycodone 30mg  #150 [facility]  Patient denies that he picked up two rxs in  Review of Systems  Constitutional: Negative for fever, chills, diaphoresis, activity change, appetite change and fatigue.  Respiratory: Negative for cough and shortness of breath.   Cardiovascular: Negative for chest pain, palpitations and leg swelling.  Gastrointestinal: Positive for diarrhea. Negative for nausea, vomiting and abdominal pain.  Endocrine: Negative for cold intolerance, heat intolerance, polydipsia, polyphagia and polyuria.  Musculoskeletal: Positive for myalgias and arthralgias.  Skin: Negative for color change, rash and wound.  Neurological: Negative for dizziness, tremors, seizures, syncope, facial asymmetry, speech difficulty, weakness, light-headedness, numbness and headaches.  Psychiatric/Behavioral: Negative for sleep disturbance and dysphoric mood. The patient is not nervous/anxious.     Past Medical History  Diagnosis Date  . Crohn disease ([place])   . Asthma    Past Surgical History  Procedure Laterality Date  . Abdominal surgery    . Surgery for crohns     Allergies  Allergen Reactions  . Benadryl [Diphenhydramine Hcl] Anaphylaxis  . Broccoli [Brassica Oleracea Italica] Other (See Comments)    Causes terrible gas.   Marland Kitchen  Scallops [Shellfish Allergy] Diarrhea and Nausea And Vomiting  . Sulfa Antibiotics Hives  . Tylenol [Acetaminophen] Nausea And Vomiting   Current Outpatient Prescriptions  Medication Sig Dispense Refill  . albuterol (PROVENTIL HFA;VENTOLIN HFA) 108 (90 BASE) MCG/ACT inhaler Inhale 2 puffs into the lungs every 6 (six) hours as needed for  wheezing or shortness of breath. 1 Inhaler 1  . HYDROCORTISONE ACE, RECTAL, 30 MG SUPP Place 1 suppository (30 mg total) rectally 2 (two) times daily. As directed 28 each 1  . hydrocortisone-pramoxine (PROCTOFOAM-HC) rectal foam Place 1 applicator rectally 2 (two) times daily. 10 g 1  . promethazine (PHENERGAN) 25 MG tablet Take 1 tablet (25 mg total) by mouth 3 (three) times daily. 90 tablet 2  . temazepam (RESTORIL) 30 MG capsule Take 1 capsule (30 mg total) by mouth daily. 30 capsule 1  . ustekinumab (STELARA) 90 MG/ML SOSY injection Reported on 01/21/2015    . Oxycodone HCl 20 MG TABS Take 1 tablet (20 mg total) by mouth 5 (five) times daily. 35 tablet 0   No current facility-administered medications for this visit.   Social History   Social History  . Marital Status: Single    Spouse Name: N/A  . Number of Children: N/A  . Years of Education: N/A   Occupational History  . Not on file.   Social History Main Topics  . Smoking status: Current Every Day Smoker  . Smokeless tobacco: Not on file  . Alcohol Use: Yes  . Drug Use: Yes    Special: Marijuana  . Sexual Activity: Not on file   Other Topics Concern  . Not on file   Social History Narrative   History reviewed. No pertinent family history.     Objective:    BP 114/70 mmHg  Pulse 87  Temp(Src) 98.2 F (36.8 C) (Oral)  Resp 17  Ht 5\' 11"  (1.803 m)  Wt 141 lb (63.957 kg)  BMI 19.67 kg/m2  SpO2 100% Physical Exam  Constitutional: He is oriented to person, place, and time. He appears well-developed and well-nourished. No distress.  HENT:  Head: Normocephalic and atraumatic.  Right Ear: External ear normal.  Left Ear: External ear normal.  Nose: Nose normal.  Mouth/Throat: Oropharynx is clear and moist.  Eyes: Conjunctivae and EOM are normal. Pupils are equal, round, and reactive to light.  Neck: Normal range of motion. Neck supple. Carotid bruit is not present. No thyromegaly present.  Cardiovascular: Normal  rate, regular rhythm, normal heart sounds and intact distal pulses.  Exam reveals no gallop and no friction rub.   No murmur heard. Pulmonary/Chest: Effort normal and breath sounds normal. He has no wheezes. He has no rales.  Abdominal: Soft. Bowel sounds are normal. He exhibits no distension and no mass. There is no tenderness. There is no rebound and no guarding.  Lymphadenopathy:    He has no cervical adenopathy.  Neurological: He is alert and oriented to person, place, and time. No cranial nerve deficit.  Skin: Skin is warm and dry. No rash noted. He is not diaphoretic.  Psychiatric: He has a normal mood and affect. His behavior is normal.  Nursing note and vitals reviewed.  Results for orders placed or performed during the hospital encounter of 06/04/11  CBC  Result Value Ref Range   WBC 15.2 (H) 4.0 - 10.5 K/uL   RBC 4.39 4.22 - 5.81 MIL/uL   Hemoglobin 12.1 (L) 13.0 - 17.0 g/dL   HCT 37.1 (L) 39.0 - 52.0 %  MCV 84.5 78.0 - 100.0 fL   MCH 27.6 26.0 - 34.0 pg   MCHC 32.6 30.0 - 36.0 g/dL   RDW 13.9 11.5 - 15.5 %   Platelets 356 150 - 400 K/uL  Differential  Result Value Ref Range   Neutrophils Relative % 72 43 - 77 %   Neutro Abs 11.0 (H) 1.7 - 7.7 K/uL   Lymphocytes Relative 12 12 - 46 %   Lymphs Abs 1.7 0.7 - 4.0 K/uL   Monocytes Relative 5 3 - 12 %   Monocytes Absolute 0.7 0.1 - 1.0 K/uL   Eosinophils Relative 11 (H) 0 - 5 %   Eosinophils Absolute 1.7 (H) 0.0 - 0.7 K/uL   Basophils Relative 1 0 - 1 %   Basophils Absolute 0.1 0.0 - 0.1 K/uL  Basic metabolic panel  Result Value Ref Range   Sodium 137 135 - 145 mEq/L   Potassium 3.9 3.5 - 5.1 mEq/L   Chloride 105 96 - 112 mEq/L   CO2 24 19 - 32 mEq/L   Glucose, Bld 97 70 - 99 mg/dL   BUN 8 6 - 23 mg/dL   Creatinine, Ser 0.74 0.50 - 1.35 mg/dL   Calcium 9.2 8.4 - 10.5 mg/dL   GFR calc non Af Amer >90 >90 mL/min   GFR calc Af Amer >90 >90 mL/min  Urinalysis, Routine w reflex microscopic  Result Value Ref Range    Color, Urine YELLOW YELLOW   APPearance CLEAR CLEAR   Specific Gravity, Urine 1.025 1.005 - 1.030   pH 6.0 5.0 - 8.0   Glucose, UA NEGATIVE NEGATIVE mg/dL   Hgb urine dipstick NEGATIVE NEGATIVE   Bilirubin Urine NEGATIVE NEGATIVE   Ketones, ur NEGATIVE NEGATIVE mg/dL   Protein, ur NEGATIVE NEGATIVE mg/dL   Urobilinogen, UA 0.2 0.0 - 1.0 mg/dL   Nitrite NEGATIVE NEGATIVE   Leukocytes, UA NEGATIVE NEGATIVE       Assessment & Plan:   1. Crohn's disease of large intestine with other complication ([place])   2. Opioid dependence with other opioid-induced disorder ([place])   3. Generalized anxiety disorder   4. Encounter for monitoring opioid maintenance therapy    -uncontrolled Crohn's disease due to non-compliance with Gi follow-up; advised patient that we do not treat Crohn's disease symptoms (abdominal pain, diarrhea) with chronic narcotics.  Refer to GI. -[facility] refuses to manage patient's chronic pain and I am personally very concerned with his high dose of narcotics especially considering his young age and polysubstance abuse (heroine use in 09/2014 and ongoing benzo use).   -I am agreeable to weaning oxycodone over the upcoming several weeks with weekly visits and weekly drug screens; patient agreeable; Dr. Laney Pastor refused further pain management because patient was not honest about pain management appointments.  -pt also requesting psychiatry and psychology consultations and I fully support these referrals which will assist with weaning chronic medication; pt is also struggling with chronic disease and depression associated with chronic illness. -rx for oxycodone 20mg  every 5 hours. #35 provided; RTC to see me only in one week; will wean down to 15mg  every 5 hours at that visit and will plan to decrease by 5mg  every week. -[place] controlled substance registry reviewed during visit.  Orders Placed This Encounter  Procedures  . Prescript Monitor Profile (9)  . Ambulatory referral to  Gastroenterology    Referral Priority:  Routine    Referral Type:  Consultation    Referral Reason:  Specialty Services Required  Number of Visits Requested:  1  . Ambulatory referral to Psychiatry    Referral Priority:  Routine    Referral Type:  Psychiatric    Referral Reason:  Specialty Services Required    Requested Specialty:  Psychiatry    Number of Visits Requested:  1   Meds ordered this encounter  Medications  . Oxycodone HCl 20 MG TABS    Sig: Take 1 tablet (20 mg total) by mouth 5 (five) times daily.    Dispense:  35 tablet    Refill:  0    No Follow-up on file.    Gaylord Seydel Elayne Guerin, M.D. [facility] [address] [place], [place]  [address] [phone] phone [phone] fax

## 2015-05-19 NOTE — Patient Instructions (Signed)
     IF you received an x-ray today, you will receive an invoice from [facility]. Please contact [facility] at [phone] with questions or concerns regarding your invoice.   IF you received labwork today, you will receive an invoice from [facility]. Please contact [facility] at [phone] with questions or concerns regarding your invoice.   Our billing staff will not be able to assist you with questions regarding bills from these companies.  You will be contacted with the lab results as soon as they are available. The fastest way to get your results is to activate your My Chart account. Instructions are located on the last page of this paperwork. If you have not heard from us regarding the results in 2 weeks, please contact this office.      

## 2015-05-25 ENCOUNTER — Ambulatory Visit (INDEPENDENT_AMBULATORY_CARE_PROVIDER_SITE_OTHER): Payer: Medicare Other | Admitting: Family Medicine

## 2015-05-25 VITALS — BP 102/65 | HR 76 | Temp 98.0°F | Resp 16 | Ht 71.0 in | Wt 148.0 lb

## 2015-05-25 DIAGNOSIS — F1124 Opioid dependence with opioid-induced mood disorder: Secondary | ICD-10-CM | POA: Diagnosis not present

## 2015-05-25 DIAGNOSIS — F419 Anxiety disorder, unspecified: Secondary | ICD-10-CM

## 2015-05-25 DIAGNOSIS — G894 Chronic pain syndrome: Secondary | ICD-10-CM | POA: Diagnosis not present

## 2015-05-25 DIAGNOSIS — F1410 Cocaine abuse, uncomplicated: Secondary | ICD-10-CM

## 2015-05-25 DIAGNOSIS — F329 Major depressive disorder, single episode, unspecified: Secondary | ICD-10-CM

## 2015-05-25 DIAGNOSIS — F418 Other specified anxiety disorders: Secondary | ICD-10-CM | POA: Diagnosis not present

## 2015-05-25 DIAGNOSIS — K5010 Crohn's disease of large intestine without complications: Secondary | ICD-10-CM | POA: Diagnosis not present

## 2015-05-25 LAB — OPIATES/OPIOIDS (LC/MS-MS)
CODEINE URINE: 72 ng/mL — AB (ref <50)
HYDROCODONE: NEGATIVE ng/mL (ref <50)
Hydromorphone: NEGATIVE ng/mL (ref <50)
MORPHINE: 2869 ng/mL — AB (ref <50)
Norhydrocodone, Ur: NEGATIVE ng/mL (ref <50)
Noroxycodone, Ur: 73 ng/mL — AB (ref <50)
OXYMORPHONE, URINE: NEGATIVE ng/mL (ref <50)
Oxycodone, ur: NEGATIVE ng/mL (ref <50)

## 2015-05-25 LAB — PRESCRIPTION MONITORING PROFILE (9 PANEL)
Amphetamine/Meth: NEGATIVE ng/mL
BENZODIAZEPINE SCREEN, URINE: NEGATIVE ng/mL
Barbiturate Screen, Urine: NEGATIVE ng/mL
CANNABINOID SCRN UR: NEGATIVE ng/mL
Creatinine, Urine: 83.94 mg/dL (ref >20.0)
METHADONE SCREEN, URINE: NEGATIVE ng/mL
Nitrites, Initial: NEGATIVE ug/mL
OXYCODONE SCRN UR: NEGATIVE ng/mL
PH URINE, INITIAL: 5.7 pH (ref 4.5–8.9)
PROPOXYPHENE: NEGATIVE ng/mL

## 2015-05-25 LAB — COCAINE METABOLITE (GC/LC/MS), URINE: BENZOYLECGONINE GC/MS CONF: 3356 ng/mL — AB (ref <100)

## 2015-05-25 MED ORDER — OXYCODONE HCL 15 MG PO TABS: 15.0000 mg | ORAL_TABLET | Freq: Four times a day (QID) | ORAL | Status: DC | PRN

## 2015-05-25 MED ORDER — METHOCARBAMOL 500 MG PO TABS: 500.0000 mg | ORAL_TABLET | Freq: Four times a day (QID) | ORAL | Status: DC | PRN

## 2015-05-25 NOTE — Patient Instructions (Signed)
     IF you received an x-ray today, you will receive an invoice from [facility]. Please contact [facility] at [phone] with questions or concerns regarding your invoice.   IF you received labwork today, you will receive an invoice from [facility]. Please contact [facility] at [phone] with questions or concerns regarding your invoice.   Our billing staff will not be able to assist you with questions regarding bills from these companies.  You will be contacted with the lab results as soon as they are available. The fastest way to get your results is to activate your My Chart account. Instructions are located on the last page of this paperwork. If you have not heard from us regarding the results in 2 weeks, please contact this office.      

## 2015-05-25 NOTE — Progress Notes (Signed)
Subjective:    Patient ID: Seth Foster, male    DOB: 01/09/1980, 35 y.o.   MRN: [id number]  05/25/2015  Medication Management and Medication Refill   HPI This 35 y.o. male presents for evaluation of opiate dependency.  2-3/10 on 30mg  five times per day. Going down to 20mg  five times daily 7/10 in a.m.; improves to 5/10 midday.  Right now back is really hurting.  Anterior legs are hurting as well which is not a new pain. Currently 5/10.  In mornings, pain is the worst.  With ambulation, pain 7/10.  Diarrhea has not been bad; decrease po intake.  Eating once per day.  Having a lot of gas.  Abdomen is tight still.  Slightly loose stools two days after last visit.  Had two b.m.s that day; end of second stool was loose.   Has been using cocaine for past year.  UDS + cocaine; "it numbs me up" "takes away the pain".  Uses 1-2 times per week for the past several months.  Not a preference.  Denies regular heroine use.    No siblings; parents; friends. Lean on pateint; has gotten rid of people.  Marijuana use in teh past prior to opiates.     Pentasa, Remicade, not effective.  Have given all Crohn's medications without improvement.  Going to clinical trials; Delara had not been approved for Crohns; was approved for psoriasis.  Fecal transplant might be an option. Has been to a homeopathic physician for two years.    Review of Systems  Constitutional: Negative for fever, chills, diaphoresis, activity change, appetite change and fatigue.  Respiratory: Negative for cough and shortness of breath.   Cardiovascular: Negative for chest pain, palpitations and leg swelling.  Gastrointestinal: Positive for abdominal pain. Negative for nausea, vomiting and diarrhea.  Endocrine: Negative for cold intolerance, heat intolerance, polydipsia, polyphagia and polyuria.  Musculoskeletal: Positive for myalgias, back pain and arthralgias.  Skin: Negative for color change, rash and wound.  Neurological: Negative  for dizziness, tremors, seizures, syncope, facial asymmetry, speech difficulty, weakness, light-headedness, numbness and headaches.  Psychiatric/Behavioral: Positive for dysphoric mood. Negative for sleep disturbance. The patient is nervous/anxious.     Past Medical History  Diagnosis Date  . Crohn disease ([place])   . Asthma    Past Surgical History  Procedure Laterality Date  . Abdominal surgery    . Surgery for crohns     Allergies  Allergen Reactions  . Benadryl [Diphenhydramine Hcl] Anaphylaxis  . Broccoli [Brassica Oleracea Italica] Other (See Comments)    Causes terrible gas.   [name] [Shellfish Allergy] Diarrhea and Nausea And Vomiting  . Sulfa Antibiotics Hives  . Tylenol [Acetaminophen] Nausea And Vomiting    Social History   Social History  . Marital Status: Single    Spouse Name: N/A  . Number of Children: N/A  . Years of Education: N/A   Occupational History  . Not on file.   Social History Main Topics  . Smoking status: Current Every Day Smoker  . Smokeless tobacco: Not on file  . Alcohol Use: Yes  . Drug Use: Yes    Special: Marijuana  . Sexual Activity: Not on file   Other Topics Concern  . Not on file   Social History Narrative   History reviewed. No pertinent family history.     Objective:    BP 102/65 mmHg  Pulse 76  Temp(Src) 98 F (36.7 C)  Resp 16  Ht 5\' 11"  (1.803 m)  Wt 148 lb (67.132 kg)  BMI 20.65 kg/m2 Physical Exam  Constitutional: He is oriented to person, place, and time. He appears well-developed and well-nourished. No distress.  HENT:  Head: Normocephalic and atraumatic.  Right Ear: External ear normal.  Left Ear: External ear normal.  Nose: Nose normal.  Mouth/Throat: Oropharynx is clear and moist.  Eyes: Conjunctivae and EOM are normal. Pupils are equal, round, and reactive to light.  Neck: Normal range of motion. Neck supple. Carotid bruit is not present. No thyromegaly present.  Cardiovascular: Normal rate,  regular rhythm, normal heart sounds and intact distal pulses.  Exam reveals no gallop and no friction rub.   No murmur heard. Pulmonary/Chest: Effort normal and breath sounds normal. He has no wheezes. He has no rales.  Abdominal: Soft. Bowel sounds are normal. He exhibits no distension and no mass. There is no tenderness. There is no rebound and no guarding.  Lymphadenopathy:    He has no cervical adenopathy.  Neurological: He is alert and oriented to person, place, and time. No cranial nerve deficit.  Skin: Skin is warm and dry. No rash noted. He is not diaphoretic.  Psychiatric: He has a normal mood and affect. His behavior is normal.  Nursing note and vitals reviewed.  Results for orders placed or performed in visit on 05/25/15  Prescript Monitor Profile (9)  Result Value Ref Range   Creatinine, Urine 53.73 >20.0 mg/dL   pH, Initial 5.6 4.5 - 8.9 pH   Nitrites, Initial NEG Cutoff:200 ug/mL   Amphetamine/Meth NEG Cutoff:500 ng/mL   Barbiturate Screen, Urine NEG Cutoff:200 ng/mL   Benzodiazepine Screen, Urine PPS Cutoff:100 ng/mL   Cannabinoid Scrn, Ur NEG Cutoff:50 ng/mL   Cocaine Metabolites PPS Cutoff:150 ng/mL   Methadone Screen, Urine NEG Cutoff:300 ng/mL   Oxycodone Screen, Ur PPS Cutoff:100 ng/mL   Propoxyphene NEG Cutoff:300 ng/mL   Opiate Screen, Urine PPS Cutoff:100 ng/mL   Prescribed Drug 1 NONE PROVIDED   Benzodiazepines (GC/LC/MS), urine  Result Value Ref Range   Alprazolam metabolite (GC/LC/MS), ur confirm 131 (A) <25 ng/mL   Midazolam (GC/LC/MS), ur confirm NEG <50 ng/mL   Triazolam metabolite (GC/LC/MS), ur confirm NEG <50 ng/mL   Clonazepam metabolite (GC/LC/MS), ur confirm NEG <25 ng/mL   Flurazepam metabolite (GC/LC/MS), ur confirm NEG <50 ng/mL   Lorazepam (GC/LC/MS), ur confirm NEG <50 ng/mL   Nordiazepam (GC/LC/MS), ur confirm NEG <50 ng/mL   Oxazepam (GC/LC/MS), ur confirm NEG <50 ng/mL   Temazepam (GC/LC/MS), ur confirm NEG <50 ng/mL  Opiates/Opioids  (LC/MS-MS)  Result Value Ref Range   Codeine Urine 101 (A) <50 ng/mL   Hydrocodone NEG <50 ng/mL   Hydromorphone NEG <50 ng/mL   Morphine Urine 2811 (A) <50 ng/mL   Norhydrocodone, Ur NEGATIVE <50 ng/mL   Noroxycodone, Ur 174 (A) <50 ng/mL   Oxycodone, ur 93 (A) <50 ng/mL   Oxymorphone 228 (A) <50 ng/mL  Cocaine Metabolite (GC/LC/MS), confirm  Result Value Ref Range   Benzoylecgonine GC/MS Conf 6248 (A) <100 ng/mL  Oxycodone, Urine (LC/MS-MS)  Result Value Ref Range   Noroxycodone, Ur 174 (A) <50 ng/mL   Oxycodone, ur 93 (A) <50 ng/mL   Oxymorphone 228 (A) <50 ng/mL       Assessment & Plan:   1. Opioid dependence with opioid-induced mood disorder ([place])   2. Crohn's disease of large intestine without complication ([place])   3. Chronic pain syndrome   4. Anxiety and depression   5. Cocaine abuse    -stable.  Tolerating wean of oxycodone relatively well with mild to moderate withdrawal symptoms. -continue wean as discussed; rx for oxycodone 15mg  every 5 hours; #35 no refills. -urine drug screen weekly as planned -RTC one week with plan to decrease to 10mg  every 5 hours. -refer to psychology and obtain updates on GI and psychiatry referrals. -rx for Robaxin provided for myalgias.    Orders Placed This Encounter  Procedures  . Prescript Monitor Profile (9)  . Benzodiazepines (GC/LC/MS), urine  . Opiates/Opioids (LC/MS-MS)  . Cocaine Metabolite (GC/LC/MS), confirm  . Oxycodone, Urine (LC/MS-MS)  . Ambulatory referral to Psychology    Referral Priority:  Routine    Referral Type:  Psychiatric    Referral Reason:  Specialty Services Required    Requested Specialty:  Psychology    Number of Visits Requested:  1   Meds ordered this encounter  Medications  . methocarbamol (ROBAXIN) 500 MG tablet    Sig: Take 1 tablet (500 mg total) by mouth every 6 (six) hours as needed for muscle spasms.    Dispense:  30 tablet    Refill:  0  . DISCONTD: oxyCODONE (ROXICODONE) 15 MG  immediate release tablet    Sig: Take 1 tablet (15 mg total) by mouth every 6 (six) hours as needed for pain.    Dispense:  35 tablet    Refill:  0    Return in about 7 days (around 06/01/2015) for recheck with Dr. Tamala Julian.    Norwood Levo, M.D. [facility] [address] [place], [place]  [address] [phone] phone [phone] fax

## 2015-05-28 LAB — BENZODIAZEPINES (GC/LC/MS), URINE
ALPRAZOLAMU: 131 ng/mL — AB (ref <25)
CLONAZEPAU: NEGATIVE ng/mL (ref <25)
FLURAZEPAMU: NEGATIVE ng/mL (ref <50)
LORAZEPAMU: NEGATIVE ng/mL (ref <50)
Midazolam (GC/LC/MS), ur confirm: NEGATIVE ng/mL (ref <50)
Nordiazepam (GC/LC/MS), ur confirm: NEGATIVE ng/mL (ref <50)
OXAZEPAMU: NEGATIVE ng/mL (ref <50)
Temazepam (GC/LC/MS), ur confirm: NEGATIVE ng/mL (ref <50)
Triazolam metabolite (GC/LC/MS), ur confirm: NEGATIVE ng/mL (ref <50)

## 2015-05-28 LAB — OPIATES/OPIOIDS (LC/MS-MS)
Codeine Urine: 101 ng/mL — AB (ref <50)
HYDROMORPHONE: NEGATIVE ng/mL (ref <50)
Hydrocodone: NEGATIVE ng/mL (ref <50)
Morphine Urine: 2811 ng/mL — AB (ref <50)
NOROXYCODONE, UR: 174 ng/mL — AB (ref <50)
Norhydrocodone, Ur: NEGATIVE ng/mL (ref <50)
OXYCODONE, UR: 93 ng/mL — AB (ref <50)
OXYMORPHONE, URINE: 228 ng/mL — AB (ref <50)

## 2015-05-28 LAB — COCAINE METABOLITE (GC/LC/MS), URINE: Benzoylecgonine GC/MS Conf: 6248 ng/mL — AB (ref <100)

## 2015-05-28 LAB — PRESCRIPTION MONITORING PROFILE (9 PANEL)
AMPHETAMINE/METH: NEGATIVE ng/mL
Barbiturate Screen, Urine: NEGATIVE ng/mL
CANNABINOID SCRN UR: NEGATIVE ng/mL
CREATININE, URINE: 53.73 mg/dL (ref >20.0)
Methadone Screen, Urine: NEGATIVE ng/mL
NITRITES URINE, INITIAL: NEGATIVE ug/mL
PROPOXYPHENE: NEGATIVE ng/mL
pH, Initial: 5.6 pH (ref 4.5–8.9)

## 2015-05-28 LAB — OXYCODONE, URINE (LC/MS-MS)
NOROXYCODONE, UR: 174 ng/mL — AB (ref <50)
OXYCODONE, UR: 93 ng/mL — AB (ref <50)
Oxymorphone: 228 ng/mL — AB (ref <50)

## 2015-06-01 ENCOUNTER — Telehealth: Payer: Self-pay

## 2015-06-01 ENCOUNTER — Ambulatory Visit (INDEPENDENT_AMBULATORY_CARE_PROVIDER_SITE_OTHER): Payer: Medicare Other | Admitting: Family Medicine

## 2015-06-01 VITALS — BP 116/64 | HR 66 | Temp 97.8°F | Resp 18 | Ht 71.0 in | Wt 146.0 lb

## 2015-06-01 DIAGNOSIS — F1120 Opioid dependence, uncomplicated: Secondary | ICD-10-CM | POA: Insufficient documentation

## 2015-06-01 DIAGNOSIS — F1910 Other psychoactive substance abuse, uncomplicated: Secondary | ICD-10-CM

## 2015-06-01 DIAGNOSIS — G8929 Other chronic pain: Secondary | ICD-10-CM

## 2015-06-01 DIAGNOSIS — F11288 Opioid dependence with other opioid-induced disorder: Secondary | ICD-10-CM

## 2015-06-01 DIAGNOSIS — R109 Unspecified abdominal pain: Secondary | ICD-10-CM

## 2015-06-01 DIAGNOSIS — K50918 Crohn's disease, unspecified, with other complication: Secondary | ICD-10-CM

## 2015-06-01 MED ORDER — HYDROXYZINE HCL 25 MG PO TABS: 25.0000 mg | ORAL_TABLET | Freq: Three times a day (TID) | ORAL | Status: DC | PRN

## 2015-06-01 MED ORDER — OXYCODONE HCL 10 MG PO TABS: 10.0000 mg | ORAL_TABLET | ORAL | Status: DC | PRN

## 2015-06-01 NOTE — Telephone Encounter (Signed)
Called patient back and left a detailed message about his medication. Hydroxyzine 25mg  was called in by Dr. Tamala Julian for pt's anxiety and problems sleeping. Pharmacy used was [facility] on [address].
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
## 2015-06-01 NOTE — Patient Instructions (Signed)
     IF you received an x-ray today, you will receive an invoice from [facility]. Please contact [facility] at [phone] with questions or concerns regarding your invoice.   IF you received labwork today, you will receive an invoice from [facility]. Please contact [facility] at [phone] with questions or concerns regarding your invoice.   Our billing staff will not be able to assist you with questions regarding bills from these companies.  You will be contacted with the lab results as soon as they are available. The fastest way to get your results is to activate your My Chart account. Instructions are located on the last page of this paperwork. If you have not heard from us regarding the results in 2 weeks, please contact this office.      

## 2015-06-05 ENCOUNTER — Other Ambulatory Visit: Payer: Self-pay | Admitting: Family Medicine

## 2015-06-06 LAB — OPIATES/OPIOIDS (LC/MS-MS)
Codeine Urine: NEGATIVE ng/mL (ref <50)
HYDROMORPHONE: NEGATIVE ng/mL (ref <50)
Hydrocodone: NEGATIVE ng/mL (ref <50)
Morphine Urine: 177 ng/mL — AB (ref <50)
NOROXYCODONE, UR: NEGATIVE ng/mL (ref <50)
Norhydrocodone, Ur: NEGATIVE ng/mL (ref <50)
OXYCODONE, UR: NEGATIVE ng/mL (ref <50)
OXYMORPHONE, URINE: NEGATIVE ng/mL (ref <50)

## 2015-06-06 LAB — BENZODIAZEPINES (GC/LC/MS), URINE
Alprazolam metabolite (GC/LC/MS), ur confirm: NEGATIVE ng/mL (ref <25)
CLONAZEPAU: NEGATIVE ng/mL (ref <25)
FLURAZEPAMU: NEGATIVE ng/mL (ref <50)
LORAZEPAMU: NEGATIVE ng/mL (ref <50)
Midazolam (GC/LC/MS), ur confirm: NEGATIVE ng/mL (ref <50)
NORDIAZEPAMU: NEGATIVE ng/mL (ref <50)
OXAZEPAMU: 84 ng/mL — AB (ref <50)
Temazepam (GC/LC/MS), ur confirm: 309 ng/mL — AB (ref <50)
Triazolam metabolite (GC/LC/MS), ur confirm: NEGATIVE ng/mL (ref <50)

## 2015-06-07 ENCOUNTER — Telehealth: Payer: Self-pay | Admitting: *Deleted

## 2015-06-07 LAB — PRESCRIPTION MONITORING PROFILE (9 PANEL)
AMPHETAMINE/METH: NEGATIVE ng/mL
BARBITURATE SCREEN, URINE: NEGATIVE ng/mL
CANNABINOID SCRN UR: NEGATIVE ng/mL
Cocaine Metabolites: NEGATIVE ng/mL
Creatinine, Urine: 46.02 mg/dL (ref >20.0)
Methadone Screen, Urine: NEGATIVE ng/mL
NITRITES URINE, INITIAL: NEGATIVE ug/mL
Oxycodone Screen, Ur: NEGATIVE ng/mL
PH URINE, INITIAL: 5.6 pH (ref 4.5–8.9)
Propoxyphene: NEGATIVE ng/mL

## 2015-06-07 NOTE — Telephone Encounter (Signed)
Patient states he spoke with [facility] and they told him they have not received his medical records.  He states it is very important that they get this paperwork.  He also claims that he was here 5:55 pm on 06/07/15 and the doors were locked.  He stated the front desk lady was very rude and watched him knock on the door for 5 minutes.  He would like for Dr. Tamala Julian to call him ASAP.  I gave him her schedule and advised him to come in to be seen early on Thurs 06/09/15.

## 2015-06-08 DIAGNOSIS — F1410 Cocaine abuse, uncomplicated: Secondary | ICD-10-CM | POA: Insufficient documentation

## 2015-06-08 NOTE — Telephone Encounter (Signed)
Dr Tamala Julian, do you want to give RF?

## 2015-06-09 ENCOUNTER — Other Ambulatory Visit: Payer: Self-pay | Admitting: Family Medicine

## 2015-06-09 ENCOUNTER — Ambulatory Visit (INDEPENDENT_AMBULATORY_CARE_PROVIDER_SITE_OTHER): Payer: Medicare Other | Admitting: Family Medicine

## 2015-06-09 VITALS — BP 118/70 | HR 68 | Temp 98.2°F | Resp 18 | Ht 71.0 in | Wt 140.3 lb

## 2015-06-09 DIAGNOSIS — R109 Unspecified abdominal pain: Secondary | ICD-10-CM | POA: Diagnosis not present

## 2015-06-09 DIAGNOSIS — F11288 Opioid dependence with other opioid-induced disorder: Secondary | ICD-10-CM | POA: Diagnosis not present

## 2015-06-09 DIAGNOSIS — K50918 Crohn's disease, unspecified, with other complication: Secondary | ICD-10-CM | POA: Diagnosis not present

## 2015-06-09 DIAGNOSIS — G8929 Other chronic pain: Secondary | ICD-10-CM

## 2015-06-09 DIAGNOSIS — F1410 Cocaine abuse, uncomplicated: Secondary | ICD-10-CM

## 2015-06-09 DIAGNOSIS — K5090 Crohn's disease, unspecified, without complications: Secondary | ICD-10-CM | POA: Diagnosis not present

## 2015-06-09 LAB — POCT URINALYSIS DIP (MANUAL ENTRY)
BILIRUBIN UA: NEGATIVE
Blood, UA: NEGATIVE
GLUCOSE UA: NEGATIVE
Ketones, POC UA: NEGATIVE
LEUKOCYTES UA: NEGATIVE
NITRITE UA: NEGATIVE
Protein Ur, POC: NEGATIVE
Spec Grav, UA: 1.015
Urobilinogen, UA: 0.2
pH, UA: 5.5

## 2015-06-09 LAB — COMPREHENSIVE METABOLIC PANEL
ALT: 47 U/L — ABNORMAL HIGH (ref 9–46)
AST: 23 U/L (ref 10–40)
Albumin: 4.4 g/dL (ref 3.6–5.1)
Alkaline Phosphatase: 151 U/L — ABNORMAL HIGH (ref 40–115)
BILIRUBIN TOTAL: 0.5 mg/dL (ref 0.2–1.2)
BUN: 9 mg/dL (ref 7–25)
CHLORIDE: 102 mmol/L (ref 98–110)
CO2: 23 mmol/L (ref 20–31)
CREATININE: 0.72 mg/dL (ref 0.60–1.35)
Calcium: 9.2 mg/dL (ref 8.6–10.3)
Glucose, Bld: 90 mg/dL (ref 65–99)
Potassium: 5.1 mmol/L (ref 3.5–5.3)
SODIUM: 139 mmol/L (ref 135–146)
TOTAL PROTEIN: 7.5 g/dL (ref 6.1–8.1)

## 2015-06-09 LAB — POCT CBC
GRANULOCYTE PERCENT: 69.9 % (ref 37–80)
HEMATOCRIT: 38.7 % — AB (ref 43.5–53.7)
HEMOGLOBIN: 13.5 g/dL — AB (ref 14.1–18.1)
LYMPH, POC: 1.5 (ref 0.6–3.4)
MCH, POC: 29.9 pg (ref 27–31.2)
MCHC: 34.8 g/dL (ref 31.8–35.4)
MCV: 85.8 fL (ref 80–97)
MID (cbc): 0.8 (ref 0–0.9)
MPV: 8.8 fL (ref 0–99.8)
POC GRANULOCYTE: 5.4 (ref 2–6.9)
POC LYMPH %: 19.7 % (ref 10–50)
POC MID %: 10.4 %M (ref 0–12)
Platelet Count, POC: 336 10*3/uL (ref 142–424)
RBC: 4.51 M/uL — AB (ref 4.69–6.13)
RDW, POC: 13.3 %
WBC: 7.7 10*3/uL (ref 4.6–10.2)

## 2015-06-09 LAB — LIPASE: Lipase: 16 U/L (ref 7–60)

## 2015-06-09 LAB — AMYLASE: Amylase: 35 U/L (ref 0–105)

## 2015-06-09 MED ORDER — PROMETHAZINE HCL 25 MG PO TABS: 25.0000 mg | ORAL_TABLET | Freq: Three times a day (TID) | ORAL | Status: DC

## 2015-06-09 MED ORDER — OXYCODONE HCL 5 MG PO TABS: 5.0000 mg | ORAL_TABLET | ORAL | Status: DC | PRN

## 2015-06-09 MED ORDER — METHOCARBAMOL 500 MG PO TABS: 500.0000 mg | ORAL_TABLET | Freq: Four times a day (QID) | ORAL | Status: DC | PRN

## 2015-06-09 MED ORDER — HYOSCYAMINE SULFATE ER 0.375 MG PO TB12: 0.3750 mg | ORAL_TABLET | Freq: Two times a day (BID) | ORAL | Status: DC

## 2015-06-09 NOTE — Patient Instructions (Signed)
     IF you received an x-ray today, you will receive an invoice from [facility]. Please contact [facility] at [phone] with questions or concerns regarding your invoice.   IF you received labwork today, you will receive an invoice from [facility]. Please contact [facility] at [phone] with questions or concerns regarding your invoice.   Our billing staff will not be able to assist you with questions regarding bills from these companies.  You will be contacted with the lab results as soon as they are available. The fastest way to get your results is to activate your My Chart account. Instructions are located on the last page of this paperwork. If you have not heard from us regarding the results in 2 weeks, please contact this office.      

## 2015-06-09 NOTE — Progress Notes (Signed)
Subjective:    Patient ID: Seth Foster, male    DOB: 25-Oct-1980, 35 y.o.   MRN: [id number]  06/09/2015  Medication Refill   HPI This 35 y.o. male presents for evaluation of opiate dependence and polysubstance abuse.  Dr. Laney Pastor had referred patient to the pain clinic in the past; pt missed an appointment for the pain clinic and was charged a no show fee.  Has been trying to schedule an appointment with pain management but must pay $300 up front due to no show.  Has been trying to contact psychology for an appointment; awaiting a call back.  Awaiting records from [facility] to schedule appointment with [facility].  Having a lot of abdominal pain on lower dose of oxycodone.  Trying to get out and do things but staying in bed.  At 20mg , the major discomfort was joint pains and sweats.  Did have increase in abdominal pain but mostly manageable.  Getting harder to manage.Oxycodone 10mg  every 5 hours; ran out 24 hours early on medication; has been out of medication for two days now.  Was really wanting medication every 2 hours.  Was functional on 30mg  every 5 hours.  People have suggested meditating which has not been successful.  Back pain has improved; legs have been painful.  Seemed to be helping with Robaxin. Pain in muscles is controlled relatively well with muscle relaxer. Having muscle cramps.  When waking up in morning, having 3-4 times per day; having some diarrhea; not horrible.  Large amount of stool; decreased appetite. Also has dull ache and sharp stabbing pains.  Zaps strength; does not feel like doing anything.  Forcing self to get out and get some sun.  Some friends live at apartment complex with a pool; took 10mg  and then one hour later took another 10mg ; ten minutes later had to get out of pool. Now having sharp RLQ pain.  Did not want to do the pain clinic but really considering it now.   Not getting anything done; wants to function.  Trying to avoid other substances but really difficult.   Vomiting 1-3 times per day; chronic issue with Crohn's disease.     Review of Systems  Constitutional: Negative for fever, chills, diaphoresis, activity change, appetite change and fatigue.  Respiratory: Negative for cough and shortness of breath.   Cardiovascular: Negative for chest pain, palpitations and leg swelling.  Gastrointestinal: Positive for abdominal pain and diarrhea. Negative for nausea and vomiting.  Endocrine: Negative for cold intolerance, heat intolerance, polydipsia, polyphagia and polyuria.  Musculoskeletal: Positive for myalgias and arthralgias.  Skin: Negative for color change, rash and wound.  Neurological: Negative for dizziness, tremors, seizures, syncope, facial asymmetry, speech difficulty, weakness, light-headedness, numbness and headaches.  Psychiatric/Behavioral: Positive for sleep disturbance and dysphoric mood. Negative for suicidal ideas and self-injury. The patient is nervous/anxious.     Past Medical History  Diagnosis Date  . Crohn disease ([place])   . Asthma   . Chronic pain syndrome    Past Surgical History  Procedure Laterality Date  . Abdominal surgery    . Surgery for crohns     Allergies  Allergen Reactions  . Benadryl [Diphenhydramine Hcl] Anaphylaxis  . Broccoli [Brassica Oleracea Italica] Other (See Comments)    Causes terrible gas.   [name] [Shellfish Allergy] Diarrhea and Nausea And Vomiting  . Sulfa Antibiotics Hives  . Tylenol [Acetaminophen] Nausea And Vomiting    Social History   Social History  . Marital Status: Single  Spouse Name: N/A  . Number of Children: N/A  . Years of Education: N/A   Occupational History  . Not on file.   Social History Main Topics  . Smoking status: Current Every Day Smoker -- 0.50 packs/day for 16 years    Types: Cigarettes  . Smokeless tobacco: Not on file  . Alcohol Use: 0.0 oz/week    0 Standard drinks or equivalent per week  . Drug Use: Yes    Special: Marijuana  . Sexual  Activity: Not on file   Other Topics Concern  . Not on file   Social History Narrative   History reviewed. No pertinent family history.     Objective:    BP 118/70 mmHg  Pulse 68  Temp(Src) 98.2 F (36.8 C) (Oral)  Resp 18  Ht 5\' 11"  (1.803 m)  Wt 140 lb 4.8 oz (63.64 kg)  BMI 19.58 kg/m2  SpO2 100% Physical Exam  Constitutional: He is oriented to person, place, and time. He appears well-developed and well-nourished. No distress.  HENT:  Head: Normocephalic and atraumatic.  Right Ear: External ear normal.  Left Ear: External ear normal.  Nose: Nose normal.  Mouth/Throat: Oropharynx is clear and moist.  Eyes: Conjunctivae and EOM are normal. Pupils are equal, round, and reactive to light.  Neck: Normal range of motion. Neck supple. Carotid bruit is not present. No thyromegaly present.  Cardiovascular: Normal rate, regular rhythm, normal heart sounds and intact distal pulses.  Exam reveals no gallop and no friction rub.   No murmur heard. Pulmonary/Chest: Effort normal and breath sounds normal. He has no wheezes. He has no rales.  Abdominal: Soft. Bowel sounds are normal. He exhibits no distension and no mass. There is no tenderness. There is no rebound and no guarding.  Lymphadenopathy:    He has no cervical adenopathy.  Neurological: He is alert and oriented to person, place, and time. No cranial nerve deficit.  Skin: Skin is warm and dry. No rash noted. He is not diaphoretic.  Psychiatric: He has a normal mood and affect. His behavior is normal.  Nursing note and vitals reviewed.       Assessment & Plan:   1. Opioid dependence with other opioid-induced disorder ([place])   2. Crohn's disease of intestine, other complication ([place])   3. Cocaine abuse   4. Chronic abdominal pain    -stable. -continue to wean oxycodone each week by 25%. -continue weekly drug screens. -emphasized importance in establishing with new gastroenterologist for ongoing Crohn's disease  care. -also emphasized importance with establishing with psychologist/therapist and psychiatrist to address chronic disease state and weaning opiates. -warrants a lot of support to be successful.    Orders Placed This Encounter  Procedures  . Comprehensive metabolic panel  . Amylase  . Lipase  . Prescript Monitor Profile (9)  . POCT CBC  . POCT urinalysis dipstick   Meds ordered this encounter  Medications  . DISCONTD: methocarbamol (ROBAXIN) 500 MG tablet    Sig: Take 1 tablet (500 mg total) by mouth every 6 (six) hours as needed for muscle spasms.    Dispense:  45 tablet    Refill:  0  . DISCONTD: oxyCODONE (OXY IR/ROXICODONE) 5 MG immediate release tablet    Sig: Take 1 tablet (5 mg total) by mouth every 5 (five) hours as needed for severe pain.    Dispense:  35 tablet    Refill:  0  . DISCONTD: hyoscyamine (LEVBID) 0.375 MG 12 hr tablet  Sig: Take 1 tablet (0.375 mg total) by mouth 2 (two) times daily.    Dispense:  60 tablet    Refill:  0  . promethazine (PHENERGAN) 25 MG tablet    Sig: Take 1 tablet (25 mg total) by mouth 3 (three) times daily.    Dispense:  90 tablet    Refill:  0    Return in about 1 week (around 06/16/2015) for recheck Dr. Tamala Julian.    Norwood Levo, M.D. [facility] [address] [place], [place]  [address] [phone] phone [phone] fax

## 2015-06-10 ENCOUNTER — Telehealth: Payer: Self-pay

## 2015-06-10 NOTE — Telephone Encounter (Signed)
Dr Tamala Julian, I received 2 notices from [place]. First, hyoscyamine is a plan exclusion, meaning that insurance will not even consider a PA. 2nd, methocarbamol is "product not covered", and it lists meloxicam and tizanidine as alternatives. Pharm is asking for alternatives for both, although a PA may be possible on meloxicam, because the it is not listed as exclusion? If you'd like I can try for that on Monday if you don't want to send alternative.

## 2015-06-14 MED ORDER — TIZANIDINE HCL 4 MG PO TABS: 4.0000 mg | ORAL_TABLET | Freq: Four times a day (QID) | ORAL | Status: DC | PRN

## 2015-06-14 NOTE — Telephone Encounter (Signed)
Advised patient at appointment on 06/09/15 that [facility] needs previous GI records.  No further action warranted.

## 2015-06-14 NOTE — Telephone Encounter (Signed)
1. I sent in Tizanidine to replace Methocarbamol; please advise patient.  2.  Please advise patient that insurance will not cover hyoscyamine.

## 2015-06-15 NOTE — Telephone Encounter (Signed)
Called and advised pt of new Rx for tizanidine. He will call his insurance co and call me back if they have a covered alternative for hyoscyamine.

## 2015-06-16 ENCOUNTER — Ambulatory Visit (INDEPENDENT_AMBULATORY_CARE_PROVIDER_SITE_OTHER): Payer: Medicare Other | Admitting: Family Medicine

## 2015-06-16 VITALS — BP 118/64 | HR 99 | Temp 98.1°F | Resp 18 | Ht 71.0 in | Wt 145.6 lb

## 2015-06-16 DIAGNOSIS — F1410 Cocaine abuse, uncomplicated: Secondary | ICD-10-CM

## 2015-06-16 DIAGNOSIS — F418 Other specified anxiety disorders: Secondary | ICD-10-CM

## 2015-06-16 DIAGNOSIS — R7989 Other specified abnormal findings of blood chemistry: Secondary | ICD-10-CM

## 2015-06-16 DIAGNOSIS — G8929 Other chronic pain: Secondary | ICD-10-CM | POA: Diagnosis not present

## 2015-06-16 DIAGNOSIS — G4700 Insomnia, unspecified: Secondary | ICD-10-CM | POA: Diagnosis not present

## 2015-06-16 DIAGNOSIS — R799 Abnormal finding of blood chemistry, unspecified: Secondary | ICD-10-CM | POA: Diagnosis not present

## 2015-06-16 DIAGNOSIS — R945 Abnormal results of liver function studies: Secondary | ICD-10-CM

## 2015-06-16 DIAGNOSIS — F329 Major depressive disorder, single episode, unspecified: Secondary | ICD-10-CM

## 2015-06-16 DIAGNOSIS — K50918 Crohn's disease, unspecified, with other complication: Secondary | ICD-10-CM | POA: Diagnosis not present

## 2015-06-16 DIAGNOSIS — F419 Anxiety disorder, unspecified: Secondary | ICD-10-CM

## 2015-06-16 DIAGNOSIS — F11288 Opioid dependence with other opioid-induced disorder: Secondary | ICD-10-CM

## 2015-06-16 DIAGNOSIS — R109 Unspecified abdominal pain: Secondary | ICD-10-CM

## 2015-06-16 MED ORDER — OXYCODONE HCL 5 MG PO TABS: 5.0000 mg | ORAL_TABLET | Freq: Four times a day (QID) | ORAL | Status: DC | PRN

## 2015-06-16 NOTE — Patient Instructions (Addendum)
Please call in one week for refill of medication.     IF you received an x-ray today, you will receive an invoice from [facility]. Please contact [facility] at [phone] with questions or concerns regarding your invoice.   IF you received labwork today, you will receive an invoice from [employer]. Please contact [facility] at [phone] with questions or concerns regarding your invoice.   Our billing staff will not be able to assist you with questions regarding bills from these companies.  You will be contacted with the lab results as soon as they are available. The fastest way to get your results is to activate your My Chart account. Instructions are located on the last page of this paperwork. If you have not heard from [place] regarding the results in 2 weeks, please contact this office.

## 2015-06-16 NOTE — Progress Notes (Signed)
Subjective:    Patient ID: Seth Foster, male    DOB: 11-Nov-1980, 35 y.o.   MRN: [id number]  06/16/2015  Medication Refill   HPI This 35 y.o. male presents for one week follow-up:    [place] Controlled Substance Registry reviewed. Last fill 06/09/15 oxycodone 5mg  #35 no refills; [name]; now has a job with catering.  Has not gotten records from [facility].  Does not have a car.  Must rely on mother to go get records.   Crohns disease: having 5-10 stools per day.  No imodium.  Actually ate at work today; unable to sleep for 12:30pm to 3:30pm; slept one hour 12:00am-1:00am.  Not eating much for past week.     Review of Systems  Constitutional: Negative for fever, chills, diaphoresis, activity change, appetite change and fatigue.  Respiratory: Negative for cough and shortness of breath.   Cardiovascular: Negative for chest pain, palpitations and leg swelling.  Gastrointestinal: Positive for abdominal pain and diarrhea. Negative for nausea and vomiting.  Endocrine: Negative for cold intolerance, heat intolerance, polydipsia, polyphagia and polyuria.  Musculoskeletal: Positive for myalgias and arthralgias.  Skin: Negative for color change, rash and wound.  Neurological: Negative for dizziness, tremors, seizures, syncope, facial asymmetry, speech difficulty, weakness, light-headedness, numbness and headaches.  Psychiatric/Behavioral: Positive for sleep disturbance and dysphoric mood. Negative for suicidal ideas and self-injury. The patient is nervous/anxious.     Past Medical History  Diagnosis Date  . Crohn disease ([place])   . Asthma   . Chronic pain syndrome    Past Surgical History  Procedure Laterality Date  . Abdominal surgery    . Surgery for crohns     Allergies  Allergen Reactions  . Benadryl [Diphenhydramine Hcl] Anaphylaxis  . Broccoli [Brassica Oleracea Italica] Other (See Comments)    Causes terrible gas.   [name] [Shellfish Allergy] Diarrhea and Nausea And  Vomiting  . Sulfa Antibiotics Hives  . Tylenol [Acetaminophen] Nausea And Vomiting    Social History   Social History  . Marital Status: Single    Spouse Name: N/A  . Number of Children: N/A  . Years of Education: N/A   Occupational History  . Not on file.   Social History Main Topics  . Smoking status: Current Every Day Smoker -- 0.50 packs/day for 16 years    Types: Cigarettes  . Smokeless tobacco: Not on file  . Alcohol Use: 0.0 oz/week    0 Standard drinks or equivalent per week  . Drug Use: Yes    Special: Marijuana  . Sexual Activity: Not on file   Other Topics Concern  . Not on file   Social History Narrative   History reviewed. No pertinent family history.     Objective:    BP 118/64 mmHg  Pulse 99  Temp(Src) 98.1 F (36.7 C) (Oral)  Resp 18  Ht 5\' 11"  (1.803 m)  Wt 145 lb 9.6 oz (66.044 kg)  BMI 20.32 kg/m2  SpO2 96% Physical Exam  Constitutional: He is oriented to person, place, and time. He appears well-developed and well-nourished. No distress.  HENT:  Head: Normocephalic and atraumatic.  Right Ear: External ear normal.  Left Ear: External ear normal.  Nose: Nose normal.  Mouth/Throat: Oropharynx is clear and moist.  Eyes: Conjunctivae and EOM are normal. Pupils are equal, round, and reactive to light.  Neck: Normal range of motion. Neck supple. Carotid bruit is not present. No thyromegaly present.  Cardiovascular: Normal rate, regular rhythm, normal heart  sounds and intact distal pulses.  Exam reveals no gallop and no friction rub.   No murmur heard. Pulmonary/Chest: Effort normal and breath sounds normal. He has no wheezes. He has no rales.  Abdominal: Soft. Bowel sounds are normal. He exhibits no distension and no mass. There is no tenderness. There is no rebound and no guarding.  Lymphadenopathy:    He has no cervical adenopathy.  Neurological: He is alert and oriented to person, place, and time. No cranial nerve deficit.  Skin: Skin is  warm and dry. No rash noted. He is not diaphoretic.  Psychiatric: He has a normal mood and affect. His behavior is normal.  Nursing note and vitals reviewed.       Assessment & Plan:   1. Crohn's disease of intestine, other complication ([place])   2. Chronic abdominal pain   3. Opioid dependence with other opioid-induced disorder ([place])   4. Cocaine abuse   5. Insomnia   6. Elevated LFTs   7. Anxiety and depression    -continue to decrease dose of oxycodone to 5mg  every 6 hours; tolerating wean of medication well.   -follow-up one week; will continue with weekly drug screens. -encourage establishing with local gastroenterologist; also encourage establishing with local psychiatry and psychologist; referrals have been made several weeks ago.   Orders Placed This Encounter  Procedures  . Comprehensive metabolic panel  . Acute Hep Panel & Hep B Surface Ab  . Pain Mgmt, Prof 5 DL Conf w/o mM, U   Meds ordered this encounter  Medications  . DISCONTD: oxyCODONE (OXY IR/ROXICODONE) 5 MG immediate release tablet    Sig: Take 1 tablet (5 mg total) by mouth every 6 (six) hours as needed for severe pain.    Dispense:  28 tablet    Refill:  0    Return in about 2 weeks (around 06/30/2015) for recheck.    [name] Elayne Guerin, M.D. [facility] [address] [place], [place]  [address] [phone] phone [phone] fax

## 2015-06-17 LAB — ACUTE HEP PANEL AND HEP B SURFACE AB
HCV Ab: NEGATIVE
HEP B C IGM: NONREACTIVE
HEP B S AG: NEGATIVE
Hep A IgM: NONREACTIVE
Hep B S Ab: POSITIVE — AB

## 2015-06-17 LAB — COMPREHENSIVE METABOLIC PANEL
ALBUMIN: 4.2 g/dL (ref 3.6–5.1)
ALK PHOS: 109 U/L (ref 40–115)
ALT: 14 U/L (ref 9–46)
AST: 12 U/L (ref 10–40)
BILIRUBIN TOTAL: 0.3 mg/dL (ref 0.2–1.2)
BUN: 17 mg/dL (ref 7–25)
CHLORIDE: 104 mmol/L (ref 98–110)
CO2: 24 mmol/L (ref 20–31)
CREATININE: 0.87 mg/dL (ref 0.60–1.35)
Calcium: 8.6 mg/dL (ref 8.6–10.3)
Glucose, Bld: 85 mg/dL (ref 65–99)
Potassium: 5.1 mmol/L (ref 3.5–5.3)
SODIUM: 139 mmol/L (ref 135–146)
TOTAL PROTEIN: 7.1 g/dL (ref 6.1–8.1)

## 2015-06-19 LAB — PAIN MGMT, PROFILE 5 W/O MEDMATCH U
Amphetamines: NEGATIVE ng/mL (ref <500)
BARBITURATES: NEGATIVE ng/mL (ref <300)
Benzodiazepines: NEGATIVE ng/mL (ref <100)
Benzoylecgonine: 4401 ng/mL — ABNORMAL HIGH (ref <100)
CODEINE: NEGATIVE ng/mL (ref <50)
Cocaine Metabolite: POSITIVE ng/mL — AB (ref <150)
Creatinine: 97.7 mg/dL (ref >=20.0)
HYDROMORPHONE: NEGATIVE ng/mL (ref <50)
Hydrocodone: NEGATIVE ng/mL (ref <50)
METHADONE METABOLITE: NEGATIVE ng/mL (ref <100)
MORPHINE: 2569 ng/mL — AB (ref <50)
Marijuana Metabolite: NEGATIVE ng/mL (ref <20)
Norhydrocodone: NEGATIVE ng/mL (ref <50)
OPIATES: POSITIVE ng/mL — AB (ref <100)
Oxidant: NEGATIVE ug/mL (ref <200)
Oxycodone: NEGATIVE ng/mL (ref <100)
pH: 6.17 (ref 4.5–9.0)

## 2015-06-19 LAB — PAIN MGMT, PROPOXYPHENE W/CONF, U: PROPOXYPHENE: NEGATIVE ng/mL (ref <300)

## 2015-06-22 LAB — PAIN MGMT, PROF 5 DL CONF W/O MM, U
Amphetamines: NEGATIVE ng/mL (ref <500)
BARBITURATES: NEGATIVE ng/mL (ref <300)
BENZOYLECGONINE: 4828 ng/mL — AB (ref <100)
Benzodiazepines: NEGATIVE ng/mL (ref <100)
COCAINE METABOLITE: POSITIVE ng/mL — AB (ref <150)
CREATININE: 82.2 mg/dL (ref >=20.0)
Codeine: 79 ng/mL — ABNORMAL HIGH (ref <50)
Hydrocodone: NEGATIVE ng/mL (ref <50)
Hydromorphone: NEGATIVE ng/mL (ref <50)
MORPHINE: 5774 ng/mL — AB (ref <50)
Marijuana Metabolite: NEGATIVE ng/mL (ref <20)
Methadone Metabolite: NEGATIVE ng/mL (ref <100)
NORHYDROCODONE: NEGATIVE ng/mL (ref <50)
OPIATES: POSITIVE ng/mL — AB (ref <100)
OXIDANT: NEGATIVE ug/mL (ref <200)
Oxycodone: NEGATIVE ng/mL (ref <100)
PH: 6.67 (ref 4.5–9.0)

## 2015-06-23 ENCOUNTER — Telehealth: Payer: Self-pay

## 2015-06-23 NOTE — Telephone Encounter (Signed)
Pt needs a refill on his oxycodone.  Call temporary number [phone]

## 2015-06-24 NOTE — Telephone Encounter (Signed)
According to OV notes, Dr Tamala Julian instr'd pt to RTC in 1 week for follow up. She will not be able to give him a refill without seeing him. I called # below and the male answering phone stated that pt is using his # as contact # and he will have pt return our call. Please advise pt that Dr Tamala Julian is not in today, but he would need to return to see another provider in order to discuss a refill.

## 2015-06-24 NOTE — Telephone Encounter (Signed)
Patient called to follow up on refill request. I informed patient that it takes 24-72 hours not including the weekend

## 2015-06-29 ENCOUNTER — Other Ambulatory Visit: Payer: Self-pay | Admitting: Internal Medicine

## 2015-06-29 ENCOUNTER — Ambulatory Visit (INDEPENDENT_AMBULATORY_CARE_PROVIDER_SITE_OTHER): Payer: Medicare Other | Admitting: Family Medicine

## 2015-06-29 VITALS — BP 116/70 | HR 95 | Temp 98.2°F | Resp 18 | Ht 71.0 in | Wt 148.4 lb

## 2015-06-29 DIAGNOSIS — F1410 Cocaine abuse, uncomplicated: Secondary | ICD-10-CM | POA: Diagnosis not present

## 2015-06-29 DIAGNOSIS — R109 Unspecified abdominal pain: Secondary | ICD-10-CM | POA: Diagnosis not present

## 2015-06-29 DIAGNOSIS — Z720 Tobacco use: Secondary | ICD-10-CM

## 2015-06-29 DIAGNOSIS — F11288 Opioid dependence with other opioid-induced disorder: Secondary | ICD-10-CM

## 2015-06-29 DIAGNOSIS — K50918 Crohn's disease, unspecified, with other complication: Secondary | ICD-10-CM | POA: Diagnosis not present

## 2015-06-29 DIAGNOSIS — Z980 Intestinal bypass and anastomosis status: Secondary | ICD-10-CM | POA: Diagnosis not present

## 2015-06-29 DIAGNOSIS — F332 Major depressive disorder, recurrent severe without psychotic features: Secondary | ICD-10-CM | POA: Diagnosis not present

## 2015-06-29 DIAGNOSIS — G8929 Other chronic pain: Secondary | ICD-10-CM | POA: Diagnosis not present

## 2015-06-29 DIAGNOSIS — K5090 Crohn's disease, unspecified, without complications: Secondary | ICD-10-CM | POA: Diagnosis not present

## 2015-06-29 LAB — POCT CBC
Granulocyte percent: 66.7 %G (ref 37–80)
HCT, POC: 35.2 % — AB (ref 43.5–53.7)
Hemoglobin: 12.2 g/dL — AB (ref 14.1–18.1)
Lymph, poc: 3.2 (ref 0.6–3.4)
MCH, POC: 29.3 pg (ref 27–31.2)
MCHC: 34.6 g/dL (ref 31.8–35.4)
MCV: 84.7 fL (ref 80–97)
MID (CBC): 1.1 — AB (ref 0–0.9)
MPV: 7.5 fL (ref 0–99.8)
PLATELET COUNT, POC: 271 10*3/uL (ref 142–424)
POC Granulocyte: 8.6 — AB (ref 2–6.9)
POC LYMPH %: 24.7 % (ref 10–50)
POC MID %: 8.6 % (ref 0–12)
RBC: 4.16 M/uL — AB (ref 4.69–6.13)
RDW, POC: 13.2 %
WBC: 12.9 10*3/uL — AB (ref 4.6–10.2)

## 2015-06-29 LAB — COMPREHENSIVE METABOLIC PANEL
ALK PHOS: 83 U/L (ref 40–115)
ALT: 11 U/L (ref 9–46)
AST: 12 U/L (ref 10–40)
Albumin: 4 g/dL (ref 3.6–5.1)
BUN: 17 mg/dL (ref 7–25)
CALCIUM: 8.5 mg/dL — AB (ref 8.6–10.3)
CHLORIDE: 104 mmol/L (ref 98–110)
CO2: 23 mmol/L (ref 20–31)
Creat: 1 mg/dL (ref 0.60–1.35)
GLUCOSE: 81 mg/dL (ref 65–99)
POTASSIUM: 4.4 mmol/L (ref 3.5–5.3)
Sodium: 139 mmol/L (ref 135–146)
Total Bilirubin: 0.3 mg/dL (ref 0.2–1.2)
Total Protein: 6.8 g/dL (ref 6.1–8.1)

## 2015-06-29 LAB — POC MICROSCOPIC URINALYSIS (UMFC)

## 2015-06-29 LAB — POCT URINALYSIS DIP (MANUAL ENTRY)
BILIRUBIN UA: NEGATIVE
Blood, UA: NEGATIVE
GLUCOSE UA: NEGATIVE
Leukocytes, UA: NEGATIVE
NITRITE UA: NEGATIVE
Protein Ur, POC: NEGATIVE
Spec Grav, UA: 1.025
UROBILINOGEN UA: 0.2
pH, UA: 5.5

## 2015-06-29 MED ORDER — HYDROXYZINE HCL 25 MG PO TABS: 25.0000 mg | ORAL_TABLET | Freq: Three times a day (TID) | ORAL | Status: DC | PRN

## 2015-06-29 MED ORDER — OXYCODONE HCL 5 MG PO TABS: 5.0000 mg | ORAL_TABLET | Freq: Four times a day (QID) | ORAL | Status: DC | PRN

## 2015-06-29 MED ORDER — PAROXETINE HCL ER 12.5 MG PO TB24
12.5000 mg | ORAL_TABLET | Freq: Every day | ORAL | Status: DC
Start: 2015-06-29 — End: 2015-07-27

## 2015-06-29 MED ORDER — TIZANIDINE HCL 4 MG PO TABS: 4.0000 mg | ORAL_TABLET | Freq: Four times a day (QID) | ORAL | Status: DC | PRN
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
## 2015-06-29 NOTE — Patient Instructions (Signed)
     IF you received an x-ray today, you will receive an invoice from [facility]. Please contact [facility] at [phone] with questions or concerns regarding your invoice.   IF you received labwork today, you will receive an invoice from [facility]. Please contact [facility] at [phone] with questions or concerns regarding your invoice.   Our billing staff will not be able to assist you with questions regarding bills from these companies.  You will be contacted with the lab results as soon as they are available. The fastest way to get your results is to activate your My Chart account. Instructions are located on the last page of this paperwork. If you have not heard from us regarding the results in 2 weeks, please contact this office.      

## 2015-07-06 ENCOUNTER — Ambulatory Visit (INDEPENDENT_AMBULATORY_CARE_PROVIDER_SITE_OTHER): Payer: Medicare Other | Admitting: Family Medicine

## 2015-07-06 ENCOUNTER — Other Ambulatory Visit: Payer: Self-pay | Admitting: Family Medicine

## 2015-07-06 VITALS — BP 122/72 | HR 88 | Temp 98.1°F | Resp 17 | Ht 71.0 in | Wt 151.0 lb

## 2015-07-06 DIAGNOSIS — Z720 Tobacco use: Secondary | ICD-10-CM

## 2015-07-06 DIAGNOSIS — F332 Major depressive disorder, recurrent severe without psychotic features: Secondary | ICD-10-CM | POA: Diagnosis not present

## 2015-07-06 DIAGNOSIS — F1410 Cocaine abuse, uncomplicated: Secondary | ICD-10-CM | POA: Diagnosis not present

## 2015-07-06 DIAGNOSIS — K50918 Crohn's disease, unspecified, with other complication: Secondary | ICD-10-CM

## 2015-07-06 DIAGNOSIS — Z980 Intestinal bypass and anastomosis status: Secondary | ICD-10-CM

## 2015-07-06 DIAGNOSIS — G8929 Other chronic pain: Secondary | ICD-10-CM | POA: Diagnosis not present

## 2015-07-06 DIAGNOSIS — R109 Unspecified abdominal pain: Secondary | ICD-10-CM

## 2015-07-06 DIAGNOSIS — F11288 Opioid dependence with other opioid-induced disorder: Secondary | ICD-10-CM | POA: Diagnosis not present

## 2015-07-06 DIAGNOSIS — G4700 Insomnia, unspecified: Secondary | ICD-10-CM

## 2015-07-06 MED ORDER — OXYCODONE HCL 5 MG PO TABS: 5.0000 mg | ORAL_TABLET | Freq: Four times a day (QID) | ORAL | Status: DC | PRN

## 2015-07-06 MED ORDER — TEMAZEPAM 30 MG PO CAPS: 30.0000 mg | ORAL_CAPSULE | Freq: Every day | ORAL | Status: DC

## 2015-07-06 NOTE — Progress Notes (Signed)
Subjective:    Patient ID: Seth Foster, male    DOB: 09-27-1980, 35 y.o.   MRN: [id number]  07/06/2015  Medication Refill   HPI This 35 y.o. male presents for one week follow-up of chronic opioid dependency due to chronic abdominal pain, chronic polysubstance abuse, and anxiety and depression.  Had a better week.  No further shaking spells.  Able to work a couple of days last week. Wakes up and takes 2 oxycodone; around lunch time takes another 2 oxycodone.  Toughs it out the remainder of the week. The early morning bowel movements are runny.  Then narcotics solidify stools.  Around 5-6 pm, has 3-7 stools per evening.  No loose stools during work.  Tried one pill in morning and then taking another one 4-5 hours later.  Pain not improved much but gaining benefit of solid stools.  Suffers with a lot of gas; when taking large amount of narcotics, able to pass gas.  Now, if needs to pass gas, frequently would pass gas and stool.  Had some cold sweats but does not feel like withdrawals.  Staying up for 2-3 days again because unable to sleep. Has avoided other substances since last week.  Concerned about abdominal wall thickening.    Dr. Carlos Levering is in clinic on Tuesday and Thursdays.  Father is willing to take to [facility].  Previously seen at [facility], Previously saw [place] at [place] as well.  Not fighting to see GI.  Chronic abdominal pain due to Crohn's disease:  [place] Controlled Substance Registry review: 06/29/15 oxycodone 5mg  #28 [name] 06/16/15 oxycododone 5mg  #28 [name] 06/09/15 Oxycocodone 5mg  #35 [name]  Still wants to see [facility] but must save $300.    Anxiety and depression: started Paxil last week. Paxil caused a mild headache.  Took for four days and did not see a benefit so stopped it.  Able to fill Zanaflex.  Taking hydroxyzine qhs. Gets anxious at night.   Needs refill of Restoril.  Previous [facility] care; was advised to treat Crohns and  depression would improve.     Review of Systems  Constitutional: Negative for fever, chills, diaphoresis, activity change, appetite change and fatigue.  Respiratory: Negative for cough and shortness of breath.   Cardiovascular: Negative for chest pain, palpitations and leg swelling.  Gastrointestinal: Positive for abdominal pain and diarrhea. Negative for nausea and vomiting.  Endocrine: Negative for cold intolerance, heat intolerance, polydipsia, polyphagia and polyuria.  Skin: Negative for color change, rash and wound.  Neurological: Negative for dizziness, tremors, seizures, syncope, facial asymmetry, speech difficulty, weakness, light-headedness, numbness and headaches.  Psychiatric/Behavioral: Positive for sleep disturbance and dysphoric mood. Negative for suicidal ideas and self-injury. The patient is nervous/anxious.     Past Medical History  Diagnosis Date  . Crohn disease ([place])   . Asthma    Past Surgical History  Procedure Laterality Date  . Abdominal surgery    . Surgery for crohns     Allergies  Allergen Reactions  . Benadryl [Diphenhydramine Hcl] Anaphylaxis  . Broccoli [Brassica Oleracea Italica] Other (See Comments)    Causes terrible gas.   [name] [Shellfish Allergy] Diarrhea and Nausea And Vomiting  . Sulfa Antibiotics Hives  . Tylenol [Acetaminophen] Nausea And Vomiting   Current Outpatient Prescriptions  Medication Sig Dispense Refill  . albuterol (PROVENTIL HFA;VENTOLIN HFA) 108 (90 BASE) MCG/ACT inhaler Inhale 2 puffs into the lungs every 6 (six) hours as needed for wheezing or shortness of breath.  1 Inhaler 1  . HYDROCORTISONE ACE, RECTAL, 30 MG SUPP Place 1 suppository (30 mg total) rectally 2 (two) times daily. As directed 28 each 1  . hydrocortisone-pramoxine (PROCTOFOAM-HC) rectal foam Place 1 applicator rectally 2 (two) times daily. 10 g 1  . hydrOXYzine (ATARAX/VISTARIL) 25 MG tablet Take 1 tablet (25 mg total) by mouth 3 (three) times daily as  needed for anxiety. 30 tablet 0  . oxyCODONE (OXY IR/ROXICODONE) 5 MG immediate release tablet Take 1 tablet (5 mg total) by mouth every 6 (six) hours as needed for severe pain. 28 tablet 0  . PARoxetine (PAXIL-CR) 12.5 MG 24 hr tablet Take 1 tablet (12.5 mg total) by mouth daily. 30 tablet 5  . promethazine (PHENERGAN) 25 MG tablet Take 1 tablet (25 mg total) by mouth 3 (three) times daily. 90 tablet 0  . temazepam (RESTORIL) 30 MG capsule Take 1 capsule (30 mg total) by mouth daily. 30 capsule 2  . tiZANidine (ZANAFLEX) 4 MG tablet Take 1 tablet (4 mg total) by mouth every 6 (six) hours as needed for muscle spasms. 40 tablet 0   No current facility-administered medications for this visit.   Social History   Social History  . Marital Status: Single    Spouse Name: N/A  . Number of Children: N/A  . Years of Education: N/A   Occupational History  . Not on file.   Social History Main Topics  . Smoking status: Current Every Day Smoker -- 0.50 packs/day for 16 years    Types: Cigarettes  . Smokeless tobacco: Not on file  . Alcohol Use: 0.0 oz/week    0 Standard drinks or equivalent per week  . Drug Use: Yes    Special: Marijuana  . Sexual Activity: Not on file   Other Topics Concern  . Not on file   Social History Narrative   History reviewed. No pertinent family history.     Objective:    BP 122/72 mmHg  Pulse 88  Temp(Src) 98.1 F (36.7 C) (Oral)  Resp 17  Ht 5\' 11"  (1.803 m)  Wt 151 lb (68.493 kg)  BMI 21.07 kg/m2  SpO2 98% Physical Exam  Constitutional: He is oriented to person, place, and time. He appears well-developed and well-nourished. No distress.  Poorly groomed.  HENT:  Head: Normocephalic and atraumatic.  Right Ear: External ear normal.  Left Ear: External ear normal.  Nose: Nose normal.  Mouth/Throat: Oropharynx is clear and moist.  Eyes: Conjunctivae and EOM are normal. Pupils are equal, round, and reactive to light.  Neck: Normal range of motion.  Neck supple. Carotid bruit is not present. No thyromegaly present.  Cardiovascular: Normal rate, regular rhythm, normal heart sounds and intact distal pulses.  Exam reveals no gallop and no friction rub.   No murmur heard. Pulmonary/Chest: Effort normal and breath sounds normal. He has no wheezes. He has no rales.  Abdominal: Soft. Bowel sounds are normal. He exhibits no distension and no mass. There is tenderness. There is no rebound and no guarding.  Diffuse TTP.   Lymphadenopathy:    He has no cervical adenopathy.  Neurological: He is alert and oriented to person, place, and time. No cranial nerve deficit.  Skin: Skin is warm and dry. No rash noted. He is not diaphoretic.  Psychiatric: He has a normal mood and affect. His behavior is normal.  Nursing note and vitals reviewed.  Results for orders placed or performed in visit on 06/29/15  Comprehensive metabolic panel  Result  Value Ref Range   Sodium 139 135 - 146 mmol/L   Potassium 4.4 3.5 - 5.3 mmol/L   Chloride 104 98 - 110 mmol/L   CO2 23 20 - 31 mmol/L   Glucose, Bld 81 65 - 99 mg/dL   BUN 17 7 - 25 mg/dL   Creat 1.00 0.60 - 1.35 mg/dL   Total Bilirubin 0.3 0.2 - 1.2 mg/dL   Alkaline Phosphatase 83 40 - 115 U/L   AST 12 10 - 40 U/L   ALT 11 9 - 46 U/L   Total Protein 6.8 6.1 - 8.1 g/dL   Albumin 4.0 3.6 - 5.1 g/dL   Calcium 8.5 (L) 8.6 - 10.3 mg/dL  POCT CBC  Result Value Ref Range   WBC 12.9 (A) 4.6 - 10.2 K/uL   Lymph, poc 3.2 0.6 - 3.4   POC LYMPH PERCENT 24.7 10 - 50 %L   MID (cbc) 1.1 (A) 0 - 0.9   POC MID % 8.6 0 - 12 %M   POC Granulocyte 8.6 (A) 2 - 6.9   Granulocyte percent 66.7 37 - 80 %G   RBC 4.16 (A) 4.69 - 6.13 M/uL   Hemoglobin 12.2 (A) 14.1 - 18.1 g/dL   HCT, POC 35.2 (A) 43.5 - 53.7 %   MCV 84.7 80 - 97 fL   MCH, POC 29.3 27 - 31.2 pg   MCHC 34.6 31.8 - 35.4 g/dL   RDW, POC 13.2 %   Platelet Count, POC 271 142 - 424 K/uL   MPV 7.5 0 - 99.8 fL  POCT urinalysis dipstick  Result Value Ref Range    Color, UA yellow yellow   Clarity, UA clear clear   Glucose, UA negative negative   Bilirubin, UA negative negative   Ketones, POC UA trace (5) (A) negative   Spec Grav, UA 1.025    Blood, UA negative negative   pH, UA 5.5    Protein Ur, POC negative negative   Urobilinogen, UA 0.2    Nitrite, UA Negative Negative   Leukocytes, UA Negative Negative  POCT Microscopic Urinalysis ([facility])  Result Value Ref Range   WBC,UR,HPF,POC Few (A) None WBC/hpf   RBC,UR,HPF,POC None None RBC/hpf   Bacteria Few (A) None, Too numerous to count   Mucus Present (A) Absent   Epithelial Cells, UR Per Microscopy Few (A) None, Too numerous to count cells/hpf   Hippuric Acid Crys, UA too numerous to count      Wt Readings from Last 3 Encounters:  07/06/15 151 lb (68.493 kg)  06/29/15 148 lb 6.4 oz (67.314 kg)  06/16/15 145 lb 9.6 oz (66.044 kg)      Assessment & Plan:   1. Crohn's disease of intestine, other complication ([place])   2. Intestinal bypass or anastomosis status   3. Current tobacco use   4. Cocaine abuse   5. Opioid dependence with other opioid-induced disorder ([place])   6. Chronic abdominal pain   7. Insomnia   8. Severe episode of recurrent major depressive disorder, without psychotic features ([place])    -stable for the past week with oxycodone 5mg  one qid.  Refill provided and willing to provide refills until establishes with GI.  Patient without transportation to [facility]; thus, desires to establish with local gastroenterologist.   -recommend walking into [facility] for [facility] care yet patient resistant to this at this time; highly recommend patient restart Paxil CR 12.5mg  daily for anxiety; continue hydroxyzine PRN.  Refill of Temazepam provided at this time  until establishes with GI and psychiatry.  Will need to wean off of benzos in the future. -highly encourage patient to avoid illicit drugs.   Orders Placed This Encounter  Procedures  . Pain Mgmt, Profile 5 w/o [facility] U   Meds  ordered this encounter  Medications  . oxyCODONE (OXY IR/ROXICODONE) 5 MG immediate release tablet    Sig: Take 1 tablet (5 mg total) by mouth every 6 (six) hours as needed for severe pain.    Dispense:  28 tablet    Refill:  0  . temazepam (RESTORIL) 30 MG capsule    Sig: Take 1 capsule (30 mg total) by mouth daily.    Dispense:  30 capsule    Refill:  2    Return in about 7 days (around 07/13/2015) for recheck.    Cyler Kappes Elayne Guerin, M.D. [facility] [address] [place], [place]  [address] [phone] phone [phone] fax

## 2015-07-06 NOTE — Patient Instructions (Signed)
     IF you received an x-ray today, you will receive an invoice from [facility]. Please contact [facility] at [phone] with questions or concerns regarding your invoice.   IF you received labwork today, you will receive an invoice from [facility]. Please contact [facility] at [phone] with questions or concerns regarding your invoice.   Our billing staff will not be able to assist you with questions regarding bills from these companies.  You will be contacted with the lab results as soon as they are available. The fastest way to get your results is to activate your My Chart account. Instructions are located on the last page of this paperwork. If you have not heard from us regarding the results in 2 weeks, please contact this office.      

## 2015-07-10 LAB — PAIN MGMT, PROFILE 5 W/O MEDMATCH U
ALPHAHYDROXYTRIAZOLAM: NEGATIVE ng/mL (ref <50)
AMINOCLONAZEPAM: NEGATIVE ng/mL (ref <25)
AMPHETAMINES: NEGATIVE ng/mL (ref <500)
Alphahydroxyalprazolam: 233 ng/mL — ABNORMAL HIGH (ref <25)
Alphahydroxymidazolam: NEGATIVE ng/mL (ref <50)
BARBITURATES: NEGATIVE ng/mL (ref <300)
BENZOYLECGONINE: 2481 ng/mL — AB (ref <100)
Benzodiazepines: POSITIVE ng/mL — AB (ref <100)
CODEINE: 1060 ng/mL — AB (ref <50)
CREATININE: 74.6 mg/dL (ref >=20.0)
Cocaine Metabolite: POSITIVE ng/mL — AB (ref <150)
HYDROXYETHYLFLURAZEPAM: NEGATIVE ng/mL (ref <50)
Hydrocodone: NEGATIVE ng/mL (ref <50)
Hydromorphone: NEGATIVE ng/mL (ref <50)
LORAZEPAM: NEGATIVE ng/mL (ref <50)
METHADONE METABOLITE: NEGATIVE ng/mL (ref <100)
Marijuana Metabolite: NEGATIVE ng/mL (ref <20)
Morphine: 26169 ng/mL — ABNORMAL HIGH (ref <50)
NORDIAZEPAM: NEGATIVE ng/mL (ref <50)
Norhydrocodone: NEGATIVE ng/mL (ref <50)
Noroxycodone: 255 ng/mL — ABNORMAL HIGH (ref <50)
OPIATES: POSITIVE ng/mL — AB (ref <100)
OXAZEPAM: NEGATIVE ng/mL (ref <50)
OXYCODONE: 96 ng/mL — AB (ref <50)
OXYMORPHONE: 175 ng/mL — AB (ref <50)
Oxidant: NEGATIVE ug/mL (ref <200)
Oxycodone: POSITIVE ng/mL — AB (ref <100)
Temazepam: NEGATIVE ng/mL (ref <50)
pH: 5.35 (ref 4.5–9.0)

## 2015-07-13 ENCOUNTER — Other Ambulatory Visit: Payer: Self-pay | Admitting: Family Medicine

## 2015-07-13 MED ORDER — OXYCODONE HCL 5 MG PO TABS: 5.0000 mg | ORAL_TABLET | Freq: Four times a day (QID) | ORAL | Status: DC | PRN

## 2015-07-25 ENCOUNTER — Encounter: Payer: Self-pay | Admitting: Family Medicine

## 2015-07-27 ENCOUNTER — Ambulatory Visit (INDEPENDENT_AMBULATORY_CARE_PROVIDER_SITE_OTHER): Payer: Medicare Other | Admitting: Family Medicine

## 2015-07-27 VITALS — BP 108/70 | HR 80 | Temp 97.8°F | Resp 18 | Ht 70.0 in | Wt 144.6 lb

## 2015-07-27 DIAGNOSIS — R109 Unspecified abdominal pain: Secondary | ICD-10-CM

## 2015-07-27 DIAGNOSIS — Z720 Tobacco use: Secondary | ICD-10-CM | POA: Diagnosis not present

## 2015-07-27 DIAGNOSIS — G8929 Other chronic pain: Secondary | ICD-10-CM

## 2015-07-27 DIAGNOSIS — K50918 Crohn's disease, unspecified, with other complication: Secondary | ICD-10-CM

## 2015-07-27 DIAGNOSIS — F1410 Cocaine abuse, uncomplicated: Secondary | ICD-10-CM | POA: Diagnosis not present

## 2015-07-27 DIAGNOSIS — G4700 Insomnia, unspecified: Secondary | ICD-10-CM

## 2015-07-27 DIAGNOSIS — F11288 Opioid dependence with other opioid-induced disorder: Secondary | ICD-10-CM

## 2015-07-27 MED ORDER — TEMAZEPAM 15 MG PO CAPS: 15.0000 mg | ORAL_CAPSULE | Freq: Every day | ORAL | Status: DC

## 2015-07-27 MED ORDER — HYDROXYZINE HCL 25 MG PO TABS: 25.0000 mg | ORAL_TABLET | Freq: Three times a day (TID) | ORAL | Status: DC | PRN

## 2015-07-27 MED ORDER — PAROXETINE HCL 20 MG PO TABS: 20.0000 mg | ORAL_TABLET | Freq: Every day | ORAL | Status: DC

## 2015-07-27 MED ORDER — OXYCODONE HCL 5 MG PO TABS: 5.0000 mg | ORAL_TABLET | Freq: Four times a day (QID) | ORAL | Status: DC | PRN

## 2015-07-27 NOTE — Patient Instructions (Signed)
     IF you received an x-ray today, you will receive an invoice from [facility]. Please contact [facility] at [phone] with questions or concerns regarding your invoice.   IF you received labwork today, you will receive an invoice from [facility]. Please contact [facility] at [phone] with questions or concerns regarding your invoice.   Our billing staff will not be able to assist you with questions regarding bills from these companies.  You will be contacted with the lab results as soon as they are available. The fastest way to get your results is to activate your My Chart account. Instructions are located on the last page of this paperwork. If you have not heard from us regarding the results in 2 weeks, please contact this office.      

## 2015-07-27 NOTE — Progress Notes (Signed)
Subjective:    Patient ID: Seth Foster, male    DOB: 1980/06/03, 35 y.o.   MRN: [id number]  07/27/2015  Medication Refill (paxil; oxycodone; restoril; hydroxyzine)   HPI This 35 y.o. male presents for follow-up of chronic opioid addiction/dependency due to chronic abdominal pain due to Crohn's disease.    Taking two 5mg  before work and then two 5mg  in evenings.    Working a lot these days.  Able to perform at work when takes two 5mg  tablets; able to eat and work.   Crohn's disease: trying to communicate with [facility].  Four years ago, had a lot of troubles of intussuception.  Having intermittent abdominal pain.  Still does not have solid stools. Went to [facility] on 07/11/2015; had everything faxed. Awaiting a call from [place] [facility].  Called [facility] to follow-up last week; no results again.    Anxiety and depression: has not called in one week. Feeling good now emotionally.  Reaching out to friends who are positive influences.  Taking hydroxyzine tid; ran out last week.  Does not have Paxil CR.  Insomnia: requesting refill of Temazepam 30mg  qhs; has been taking for years. Previous Trazodone, Ambien, Gabapentin.  Previous Belsomra and Remeron.     Review of Systems  Constitutional: Negative for fever, chills, diaphoresis, activity change, appetite change and fatigue.  Respiratory: Negative for cough and shortness of breath.   Cardiovascular: Negative for chest pain, palpitations and leg swelling.  Gastrointestinal: Positive for abdominal pain, diarrhea and nausea. Negative for vomiting.  Endocrine: Negative for cold intolerance, heat intolerance, polydipsia, polyphagia and polyuria.  Skin: Negative for color change, rash and wound.  Neurological: Negative for dizziness, tremors, seizures, syncope, facial asymmetry, speech difficulty, weakness, light-headedness, numbness and headaches.  Psychiatric/Behavioral: Positive for dysphoric mood and sleep disturbance. Negative for  self-injury and suicidal ideas. The patient is nervous/anxious.     Past Medical History:  Diagnosis Date  . Asthma   . Chronic pain syndrome   . Crohn disease [facility])    Past Surgical History:  Procedure Laterality Date  . ABDOMINAL SURGERY    . surgery for Crohns     Allergies  Allergen Reactions  . Benadryl [Diphenhydramine Hcl] Anaphylaxis  . Broccoli [Brassica Oleracea Italica] Other (See Comments)    Causes terrible gas.   [name] [Shellfish Allergy] Diarrhea and Nausea And Vomiting  . Sulfa Antibiotics Hives  . Tylenol [Acetaminophen] Nausea And Vomiting    Social History   Social History  . Marital status: Single    Spouse name: N/A  . Number of children: N/A  . Years of education: N/A   Occupational History  . Not on file.   Social History Main Topics  . Smoking status: Current Every Day Smoker    Packs/day: 0.50    Years: 16.00    Types: Cigarettes  . Smokeless tobacco: Never Used  . Alcohol use 0.0 oz/week  . Drug use:     Types: Marijuana  . Sexual activity: Not on file   Other Topics Concern  . Not on file   Social History Narrative  . No narrative on file   History reviewed. No pertinent family history.     Objective:    BP 108/70   Pulse 80   Temp 97.8 F (36.6 C) (Oral)   Resp 18   Ht 5\' 10"  (1.778 m)   Wt 144 lb 9.6 oz (65.6 kg)   SpO2 99%   BMI 20.75 kg/m  Physical Exam  Constitutional: He is oriented to person, place, and time. He appears well-developed and well-nourished. No distress.  HENT:  Head: Normocephalic and atraumatic.  Right Ear: External ear normal.  Left Ear: External ear normal.  Nose: Nose normal.  Mouth/Throat: Oropharynx is clear and moist.  Eyes: Conjunctivae and EOM are normal. Pupils are equal, round, and reactive to light.  Neck: Normal range of motion. Neck supple. Carotid bruit is not present. No thyromegaly present.  Cardiovascular: Normal rate, regular rhythm, normal heart sounds and intact  distal pulses.  Exam reveals no gallop and no friction rub.   No murmur heard. Pulmonary/Chest: Effort normal and breath sounds normal. He has no wheezes. He has no rales.  Abdominal: Soft. Bowel sounds are normal. He exhibits no distension and no mass. There is tenderness. There is no rebound and no guarding.  Musculoskeletal:       Right shoulder: Normal.       Left shoulder: Normal.       Cervical back: Normal.  Lymphadenopathy:    He has no cervical adenopathy.  Neurological: He is alert and oriented to person, place, and time. He has normal reflexes. No cranial nerve deficit. He exhibits normal muscle tone. Coordination normal.  Skin: Skin is warm and dry. No rash noted. He is not diaphoretic.  Psychiatric: He has a normal mood and affect. His behavior is normal. Judgment and thought content normal.   Results for orders placed or performed in visit on 07/27/15  Pain Management Screening Profile (10S)  Result Value Ref Range   Amphetamine Screen, Ur Negative Cutoff=1000 ng/mL   Barbiturate Screen, Ur Negative Cutoff=200 ng/mL   Benzodiazepine Screen, Urine Negative Cutoff=200 ng/mL   Cannabinoids Ur Ql Scn Negative Cutoff=20 ng/mL   Cocaine(Metab.)Screen, Urine Negative Cutoff=300 ng/mL   Opiate Scrn, Ur Positive Cutoff=300 ng/mL   Oxycodone+Oxymorphone Ur Ql Scn Negative Cutoff=100 ng/mL   PCP Scrn, Ur Negative Cutoff=25 ng/mL   Methadone Scn, Ur Negative Cutoff=300 ng/mL   Propoxyphene, Screen Negative Cutoff=300 ng/mL   Creatinine(Crt), U 133.9 20.0 - 300.0 mg/dL   Ph of Urine 5.6 4.5 - 8.9   PLEASE NOTE: Comment        Assessment & Plan:   1. Opioid dependence with other opioid-induced disorder ([place])   2. Crohn's disease of intestine, other complication ([place])   3. Cocaine abuse   4. Chronic abdominal pain   5. Current tobacco use   6. Insomnia    -stable. -start Paxil 20mg  daily. -decrease Temazepam to 15mg  qhs for gradual cessation. -rx for hydroxyzine 25mg  tid  PRN anxiety -coordinating appointment with GI.   Orders Placed This Encounter  Procedures  . Pain Management Screening Profile (10S)   Meds ordered this encounter  Medications  . PARoxetine (PAXIL) 20 MG tablet    Sig: Take 1 tablet (20 mg total) by mouth daily.    Dispense:  30 tablet    Refill:  5  . DISCONTD: oxyCODONE (OXY IR/ROXICODONE) 5 MG immediate release tablet    Sig: Take 1 tablet (5 mg total) by mouth every 6 (six) hours as needed for severe pain.    Dispense:  56 tablet    Refill:  0  . DISCONTD: hydrOXYzine (ATARAX/VISTARIL) 25 MG tablet    Sig: Take 1 tablet (25 mg total) by mouth 3 (three) times daily as needed for anxiety.    Dispense:  60 tablet    Refill:  0  . DISCONTD: temazepam (RESTORIL) 15 MG capsule  Sig: Take 1 capsule (15 mg total) by mouth daily.    Dispense:  30 capsule    Refill:  0  . temazepam (RESTORIL) 15 MG capsule    Sig: Take 1 capsule (15 mg total) by mouth daily.    Dispense:  30 capsule    Refill:  0    Return in about 2 weeks (around 08/10/2015) for recheck.    Crews Mccollam Elayne Guerin, M.D. [facility] [address] [place], [place]  [address] [phone] phone [phone] fax

## 2015-07-28 ENCOUNTER — Telehealth: Payer: Self-pay

## 2015-07-28 LAB — PMP SCREEN PROFILE (10S), URINE
AMPHETAMINE SCRN UR: NEGATIVE ng/mL
Barbiturate Screen, Ur: NEGATIVE ng/mL
Benzodiazepine Screen, Urine: NEGATIVE ng/mL
COCAINE(METAB.) SCREEN, URINE: NEGATIVE ng/mL
CREATININE(CRT), U: 133.9 mg/dL (ref 20.0–300.0)
Cannabinoids Ur Ql Scn: NEGATIVE ng/mL
METHADONE SCREEN, URINE: NEGATIVE ng/mL
Opiate Scrn, Ur: POSITIVE ng/mL
Oxycodone+Oxymorphone Ur Ql Scn: NEGATIVE ng/mL
PCP SCRN UR: NEGATIVE ng/mL
PH UR, DRUG SCRN: 5.6 (ref 4.5–8.9)
PROPOXYPHENE SCREEN: NEGATIVE ng/mL

## 2015-07-28 NOTE — Telephone Encounter (Signed)
The patient's mother returned my call and reached out to the patient.  She gave him the phone number for [facility], so he will contact them to see what the next steps are for scheduling an appointment.

## 2015-07-28 NOTE — Telephone Encounter (Signed)
Left a message with the patient's mother asking the patient to return my call.  Dr Tamala Julian informed me that his phone is not active, so we need to reach out to his mother or father in order to contact him.  I looked into the patient's referral to gastroenterology.  [facility] has attempted to reach him, but because his phone number is not working, they cannot contact him to schedule.  He will need to call back [facility] at [phone].  The referral was also sent to [facility], but they will not schedule without his previous gastroenterology records.  To obtain an appointment with any GI facility, they all require any previous gastroenterology records.  He will have to fill out a medical release and have copies sent to [facility] and/or [facility].  CB# (mother's phone): [phone] CB# (father's phone): [phone]

## 2015-07-29 NOTE — Telephone Encounter (Signed)
Patient advised me at visit this week that he has signed a release of information from his previous gastroenterologist on 07/11/15. He requested that his medical records go to [facility], [facility], and [facility].

## 2015-08-01 ENCOUNTER — Telehealth: Payer: Self-pay | Admitting: Gastroenterology

## 2015-08-01 NOTE — Telephone Encounter (Signed)
Received GI records and placed on Dr. Doyne Keel desk for review. Dr. Havery Moros is Doc of the Day.

## 2015-08-09 NOTE — Telephone Encounter (Signed)
Tried to call Mr Seth Foster to schedule an office visit but phone number is not in service/disconnected. Will try again in a few days.

## 2015-08-12 ENCOUNTER — Encounter: Payer: Self-pay | Admitting: Gastroenterology

## 2015-08-12 NOTE — Telephone Encounter (Signed)
Error

## 2015-08-12 NOTE — Telephone Encounter (Signed)
Tried calling once again Seth Foster. Phone number [phone] is not in service.

## 2015-08-15 ENCOUNTER — Encounter: Payer: Self-pay | Admitting: Gastroenterology

## 2015-08-17 ENCOUNTER — Ambulatory Visit (INDEPENDENT_AMBULATORY_CARE_PROVIDER_SITE_OTHER): Payer: Medicare Other | Admitting: Family Medicine

## 2015-08-17 ENCOUNTER — Ambulatory Visit (INDEPENDENT_AMBULATORY_CARE_PROVIDER_SITE_OTHER): Payer: Medicare Other

## 2015-08-17 VITALS — BP 122/74 | HR 84 | Temp 98.5°F | Resp 17 | Ht 70.0 in | Wt 148.0 lb

## 2015-08-17 DIAGNOSIS — R1031 Right lower quadrant pain: Secondary | ICD-10-CM

## 2015-08-17 DIAGNOSIS — K50918 Crohn's disease, unspecified, with other complication: Secondary | ICD-10-CM

## 2015-08-17 DIAGNOSIS — F11288 Opioid dependence with other opioid-induced disorder: Secondary | ICD-10-CM | POA: Diagnosis not present

## 2015-08-17 DIAGNOSIS — R109 Unspecified abdominal pain: Secondary | ICD-10-CM

## 2015-08-17 DIAGNOSIS — Z980 Intestinal bypass and anastomosis status: Secondary | ICD-10-CM

## 2015-08-17 DIAGNOSIS — G4700 Insomnia, unspecified: Secondary | ICD-10-CM

## 2015-08-17 DIAGNOSIS — R197 Diarrhea, unspecified: Secondary | ICD-10-CM | POA: Diagnosis not present

## 2015-08-17 DIAGNOSIS — G8929 Other chronic pain: Secondary | ICD-10-CM | POA: Diagnosis not present

## 2015-08-17 LAB — POCT CBC
GRANULOCYTE PERCENT: 73.9 % (ref 37–80)
HCT, POC: 35.8 % — AB (ref 43.5–53.7)
Hemoglobin: 12.3 g/dL — AB (ref 14.1–18.1)
Lymph, poc: 1.8 (ref 0.6–3.4)
MCH: 29.6 pg (ref 27–31.2)
MCHC: 34.4 g/dL (ref 31.8–35.4)
MCV: 85.9 fL (ref 80–97)
MID (CBC): 0.4 (ref 0–0.9)
MPV: 8.2 fL (ref 0–99.8)
PLATELET COUNT, POC: 268 10*3/uL (ref 142–424)
POC GRANULOCYTE: 6.4 (ref 2–6.9)
POC LYMPH PERCENT: 21 %L (ref 10–50)
POC MID %: 5.1 %M (ref 0–12)
RBC: 4.16 M/uL — AB (ref 4.69–6.13)
RDW, POC: 13.1 %
WBC: 8.7 10*3/uL (ref 4.6–10.2)

## 2015-08-17 LAB — POC MICROSCOPIC URINALYSIS (UMFC): Mucus: ABSENT

## 2015-08-17 LAB — POCT URINALYSIS DIP (MANUAL ENTRY)
Bilirubin, UA: NEGATIVE
Glucose, UA: NEGATIVE
Ketones, POC UA: NEGATIVE
LEUKOCYTES UA: NEGATIVE
NITRITE UA: NEGATIVE
PH UA: 6
PROTEIN UA: NEGATIVE
RBC UA: NEGATIVE
Spec Grav, UA: 1.02
UROBILINOGEN UA: 0.2

## 2015-08-17 LAB — POCT SEDIMENTATION RATE: POCT SED RATE: 35 mm/h — AB (ref 0–22)

## 2015-08-17 MED ORDER — OXYCODONE HCL 5 MG PO TABS
5.0000 mg | ORAL_TABLET | Freq: Four times a day (QID) | ORAL | 0 refills | Status: DC | PRN
Start: 2015-08-17 — End: 2015-08-29

## 2015-08-17 NOTE — Patient Instructions (Signed)
     IF you received an x-ray today, you will receive an invoice from [facility]. Please contact  Radiology at [phone] with questions or concerns regarding your invoice.   IF you received labwork today, you will receive an invoice from [facility]. Please contact [facility] at [phone] with questions or concerns regarding your invoice.   Our billing staff will not be able to assist you with questions regarding bills from these companies.  You will be contacted with the lab results as soon as they are available. The fastest way to get your results is to activate your My Chart account. Instructions are located on the last page of this paperwork. If you have not heard from us regarding the results in 2 weeks, please contact this office.      

## 2015-08-17 NOTE — Progress Notes (Signed)
Patient ID: Seth Foster, male   DOB: July 07, 1980, 35 y.o.   MRN: [id number]   Subjective:  By signing my name below, I, Moises Blood, attest that this documentation has been prepared under the direction and in the presence of [name], MD. Electronically Signed: Moises Blood, [place]. 08/17/2015 , 11:59 AM .  Patient was seen in Room 2 .   Patient ID: Seth Foster, male    DOB: 1980-07-26, 35 y.o.   MRN: [id number]  08/17/2015  Medication Refill (oxycodone/ discuss GI appointment)   HPI This 35 y.o. male presents for two week follow-up of chronic abdominal pain and chronic narcotic dependency and Crohn's disease. Here for medication refill. He has a history of Crohn's disease and chronic opiate addiction. He's followed weekly to every 2 weeks to help wean off medications. He's been referred to GI and psychiatry. Also weaning him off temazepam. He's been very compliant with recommendations.  Has an appointment scheduled with GI in upcoming two months.  Suffered last week with moderate to severe abdominal pain and vomiting up stool; worried about recurrent bowel obstruction; abdominal pain has now improved.  Denies fever/chills/sweats.  Last vomiting was 2-3 days ago.  Having bowel movements and denies bloody stools or black stools.  Does have frequent stools and diarrhea since decreasing oxycodone dose.  High doses of oxycodone in the past has controlled diarrhea well to allow patient to work.  Will miss some days of work due to GI symptoms.  Has been talking with parents more.      Review of Systems  Constitutional: Negative for activity change, appetite change, chills, diaphoresis, fatigue and fever.  Respiratory: Negative for cough and shortness of breath.   Cardiovascular: Negative for chest pain, palpitations and leg swelling.  Gastrointestinal: Positive for abdominal distention, abdominal pain, diarrhea and nausea. Negative for anal bleeding, blood in stool, constipation, rectal  pain and vomiting.  Endocrine: Negative for cold intolerance, heat intolerance, polydipsia, polyphagia and polyuria.  Genitourinary: Negative for discharge, dysuria, flank pain, frequency, genital sores, hematuria and penile pain.  Skin: Negative for color change, rash and wound.  Neurological: Negative for dizziness, tremors, seizures, syncope, facial asymmetry, speech difficulty, weakness, light-headedness, numbness and headaches.  Psychiatric/Behavioral: Negative for dysphoric mood and sleep disturbance. The patient is not nervous/anxious.     Past Medical History:  Diagnosis Date  . Asthma   . Chronic pain syndrome   . Crohn disease [facility])    Past Surgical History:  Procedure Laterality Date  . ABDOMINAL SURGERY    . surgery for Crohns     Allergies  Allergen Reactions  . Benadryl [Diphenhydramine Hcl] Anaphylaxis  . Broccoli [Brassica Oleracea Italica] Other (See Comments)    Causes terrible gas.   [name] [Shellfish Allergy] Diarrhea and Nausea And Vomiting  . Sulfa Antibiotics Hives  . Tylenol [Acetaminophen] Nausea And Vomiting    Social History   Social History  . Marital status: Single    Spouse name: N/A  . Number of children: N/A  . Years of education: N/A   Occupational History  . Not on file.   Social History Main Topics  . Smoking status: Current Every Day Smoker    Packs/day: 0.50    Years: 16.00    Types: Cigarettes  . Smokeless tobacco: Never Used  . Alcohol use 0.0 oz/week  . Drug use:     Types: Marijuana  . Sexual activity: Not on file   Other Topics Concern  . Not on  file   Social History Narrative  . No narrative on file   History reviewed. No pertinent family history.     Objective:    BP 122/74 (BP Location: Right Arm, Patient Position: Sitting, Cuff Size: Small)   Pulse 84   Temp 98.5 F (36.9 C) (Oral)   Resp 17   Ht 5\' 10"  (1.778 m)   Wt 148 lb (67.1 kg)   SpO2 100%   BMI 21.24 kg/m   Physical Exam    Constitutional: He is oriented to person, place, and time. He appears well-developed and well-nourished. No distress.  HENT:  Head: Normocephalic and atraumatic.  Right Ear: External ear normal.  Left Ear: External ear normal.  Nose: Nose normal.  Mouth/Throat: Oropharynx is clear and moist.  Eyes: Conjunctivae and EOM are normal. Pupils are equal, round, and reactive to light.  Neck: Normal range of motion. Neck supple. Carotid bruit is not present. No thyromegaly present.  Cardiovascular: Normal rate, regular rhythm, normal heart sounds and intact distal pulses.  Exam reveals no gallop and no friction rub.   No murmur heard. Pulmonary/Chest: Effort normal and breath sounds normal. He has no wheezes. He has no rales.  Abdominal: Soft. Bowel sounds are normal. He exhibits no distension and no mass. There is tenderness in the right lower quadrant and epigastric area. There is no rebound, no guarding and no CVA tenderness. No hernia.  Lymphadenopathy:    He has no cervical adenopathy.  Neurological: He is alert and oriented to person, place, and time. No cranial nerve deficit.  Skin: Skin is warm and dry. No rash noted. He is not diaphoretic.  Psychiatric: He has a normal mood and affect. His behavior is normal.  Nursing note and vitals reviewed.       Assessment & Plan:   1. Abdominal pain, RLQ   2. Crohn's disease of intestine, other complication ([place])   3. Chronic abdominal pain   4. Intestinal bypass or anastomosis status   5. Opioid dependence with other opioid-induced disorder ([place])   6. Insomnia    -worsening abdominal pain; no evidence of SBO on AAS.  Stable abdominal exam in office; obtain labs.  To ED for acute worsening; pt declined CT abdomen/pelvis today due to improving symptoms; agreeable to undergo imaging study further if abdominal pain worsens; has upcoming appointment in GI. -obtain UDS; refill of oxycodone provided; will not decrease dosing until evaluated by GI  and initiated on Crohn's medication. -emotionally improving at this time; tolerating wean of Temazepam therapy; will wean to off in the future. Orders Placed This Encounter  Procedures  . DG Abd Acute W/Chest    Standing Status:   Future    Number of Occurrences:   1    Standing Expiration Date:   08/16/2016    Order Specific Question:   Reason for exam:    Answer:   RLQ pain, vomiting, diarrhea; history of abdominal surgery; Crohn's disease    Order Specific Question:   Preferred imaging location?    Answer:   External  . Comprehensive metabolic panel  . Pain Mgmt, Prof 8 Conf w/o mM, U  . POCT urinalysis dipstick  . POCT Microscopic Urinalysis ([facility])  . POCT CBC  . POCT SEDIMENTATION RATE   Meds ordered this encounter  Medications  . DISCONTD: oxyCODONE (OXY IR/ROXICODONE) 5 MG immediate release tablet    Sig: Take 1 tablet (5 mg total) by mouth every 6 (six) hours as needed for severe pain.  Dispense:  56 tablet    Refill:  0    No Follow-up on file.    I personally performed the services described in this documentation, which was scribed in my presence. The recorded information has been reviewed and considered.  Mackenize Delgadillo Elayne Guerin, M.D. [facility] [address] [place], [place]  [address] [phone] phone [phone] fax

## 2015-08-18 LAB — COMPREHENSIVE METABOLIC PANEL
ALT: 19 U/L (ref 9–46)
AST: 11 U/L (ref 10–40)
Albumin: 3.6 g/dL (ref 3.6–5.1)
Alkaline Phosphatase: 114 U/L (ref 40–115)
BUN: 6 mg/dL — AB (ref 7–25)
CHLORIDE: 108 mmol/L (ref 98–110)
CO2: 27 mmol/L (ref 20–31)
CREATININE: 0.79 mg/dL (ref 0.60–1.35)
Calcium: 8.6 mg/dL (ref 8.6–10.3)
Glucose, Bld: 89 mg/dL (ref 65–99)
Potassium: 4.3 mmol/L (ref 3.5–5.3)
SODIUM: 141 mmol/L (ref 135–146)
Total Bilirubin: 0.2 mg/dL (ref 0.2–1.2)
Total Protein: 6.4 g/dL (ref 6.1–8.1)

## 2015-08-24 LAB — PAIN MGMT, PROF 8 CONF W/O MM, U
6 ACETYLMORPHINE: 741 ng/mL — AB (ref <10)
6 ACETYLMORPHINE: POSITIVE ng/mL — AB (ref <10)
ALPHAHYDROXYALPRAZOLAM: 744 ng/mL — AB (ref <25)
Alcohol Metabolites: NEGATIVE ng/mL (ref <500)
Alphahydroxymidazolam: NEGATIVE ng/mL (ref <50)
Alphahydroxytriazolam: NEGATIVE ng/mL (ref <50)
Aminoclonazepam: NEGATIVE ng/mL (ref <25)
Amphetamines: NEGATIVE ng/mL (ref <500)
BENZOYLECGONINE: 25763 ng/mL — AB (ref <100)
Benzodiazepines: POSITIVE ng/mL — AB (ref <100)
Buprenorphine: NEGATIVE ng/mL (ref <5)
COCAINE METABOLITE: POSITIVE ng/mL — AB (ref <150)
Codeine: 365 ng/mL — ABNORMAL HIGH (ref <50)
Creatinine: 108.3 mg/dL (ref >=20.0)
HYDROMORPHONE: NEGATIVE ng/mL (ref <50)
Hydrocodone: NEGATIVE ng/mL (ref <50)
Hydroxyethylflurazepam: NEGATIVE ng/mL (ref <50)
LORAZEPAM: NEGATIVE ng/mL (ref <50)
MARIJUANA METABOLITE: POSITIVE ng/mL — AB (ref <20)
MDMA: NEGATIVE ng/mL (ref <500)
MORPHINE: 26213 ng/mL — AB (ref <50)
Marijuana Metabolite: 85 ng/mL — ABNORMAL HIGH (ref <5)
NORHYDROCODONE: NEGATIVE ng/mL (ref <50)
Nordiazepam: NEGATIVE ng/mL (ref <50)
Noroxycodone: 1365 ng/mL — ABNORMAL HIGH (ref <50)
OPIATES: POSITIVE ng/mL — AB (ref <100)
OXYCODONE: 305 ng/mL — AB (ref <50)
Oxazepam: NEGATIVE ng/mL (ref <50)
Oxidant: NEGATIVE ug/mL (ref <200)
Oxycodone: POSITIVE ng/mL — AB (ref <100)
Oxymorphone: 440 ng/mL — ABNORMAL HIGH (ref <50)
PLEASE NOTE: 0
Temazepam: NEGATIVE ng/mL (ref <50)
pH: 6.41 (ref 4.5–9.0)

## 2015-08-29 ENCOUNTER — Telehealth: Payer: Self-pay

## 2015-08-29 ENCOUNTER — Ambulatory Visit (INDEPENDENT_AMBULATORY_CARE_PROVIDER_SITE_OTHER): Payer: Medicare Other | Admitting: Family Medicine

## 2015-08-29 VITALS — BP 132/88 | HR 91 | Temp 98.1°F | Resp 16 | Ht 70.0 in | Wt 149.0 lb

## 2015-08-29 DIAGNOSIS — G4700 Insomnia, unspecified: Secondary | ICD-10-CM | POA: Diagnosis not present

## 2015-08-29 DIAGNOSIS — R109 Unspecified abdominal pain: Secondary | ICD-10-CM | POA: Diagnosis not present

## 2015-08-29 DIAGNOSIS — F11288 Opioid dependence with other opioid-induced disorder: Secondary | ICD-10-CM | POA: Diagnosis not present

## 2015-08-29 DIAGNOSIS — K50918 Crohn's disease, unspecified, with other complication: Secondary | ICD-10-CM | POA: Diagnosis not present

## 2015-08-29 DIAGNOSIS — G894 Chronic pain syndrome: Secondary | ICD-10-CM | POA: Diagnosis not present

## 2015-08-29 DIAGNOSIS — Z980 Intestinal bypass and anastomosis status: Secondary | ICD-10-CM

## 2015-08-29 DIAGNOSIS — K047 Periapical abscess without sinus: Secondary | ICD-10-CM

## 2015-08-29 DIAGNOSIS — G8929 Other chronic pain: Secondary | ICD-10-CM

## 2015-08-29 DIAGNOSIS — F332 Major depressive disorder, recurrent severe without psychotic features: Secondary | ICD-10-CM

## 2015-08-29 MED ORDER — PROMETHAZINE HCL 25 MG PO TABS: 25.0000 mg | ORAL_TABLET | Freq: Three times a day (TID) | ORAL | 0 refills | Status: DC

## 2015-08-29 MED ORDER — DOXYCYCLINE HYCLATE 100 MG PO CAPS: 100.0000 mg | ORAL_CAPSULE | Freq: Two times a day (BID) | ORAL | 0 refills | Status: DC

## 2015-08-29 MED ORDER — HYDROXYZINE HCL 25 MG PO TABS: 25.0000 mg | ORAL_TABLET | Freq: Three times a day (TID) | ORAL | 0 refills | Status: DC | PRN

## 2015-08-29 MED ORDER — OXYCODONE HCL 5 MG PO TABS: 5.0000 mg | ORAL_TABLET | Freq: Four times a day (QID) | ORAL | 0 refills | Status: DC | PRN

## 2015-08-29 NOTE — Patient Instructions (Signed)
     IF you received an x-ray today, you will receive an invoice from [facility]. Please contact [facility] at [phone] with questions or concerns regarding your invoice.   IF you received labwork today, you will receive an invoice from [facility]. Please contact [facility] at [phone] with questions or concerns regarding your invoice.   Our billing staff will not be able to assist you with questions regarding bills from these companies.  You will be contacted with the lab results as soon as they are available. The fastest way to get your results is to activate your My Chart account. Instructions are located on the last page of this paperwork. If you have not heard from us regarding the results in 2 weeks, please contact this office.      

## 2015-08-29 NOTE — Progress Notes (Signed)
Subjective:    Patient ID: Seth Foster, male    DOB: [birth year], 35 y.o.   MRN: [id number]  08/29/2015  Medication Refill (oxycodone, doxcycline)   HPI This 35 y.o. male presents for two week follow-up for chronic opioid addiction, chronic abdominal pain due to untreated Crohn's disease.  Has new roommates who party; admits to marijuana; does not recall cocaine.  Four to five months ago, admits to 4-5 days per week of cocaine use; numbing effect when uses.  Did use cocaine this weekend due to tooth issue.  Has avoided heroine.  Has still been using 2 bid of oxycodone.  Has job interview coming up; worried about marijuana intake.    [place] registry: oxycodone 08/17/15 56.    Crohn's disease: appointment 10/20/15.  Has diarrhea and has horrible abdominal pain at times.  Abdominal pain has improved in past week but admits to dental infection for past wek.  Tooth infection: lymph nodes in R anterior neck swollen. Requesting Doxycycline.  Just received Medicaid card; looking into dentist.    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  Has a friend male; feeling shaggy.  Cooking for friend male; made shepard's pie last week.   Review of Systems  Constitutional: Negative for activity change, appetite change, chills, diaphoresis, fatigue and fever.  HENT: Positive for dental problem.   Respiratory: Negative for cough and shortness of breath.   Cardiovascular: Negative for chest pain, palpitations and leg swelling.  Gastrointestinal: Positive for abdominal pain, diarrhea and nausea. Negative for vomiting.  Endocrine: Negative for cold intolerance, heat intolerance, polydipsia, polyphagia and polyuria.  Skin: Negative for color change, rash and wound.  Neurological: Negative for dizziness, tremors, seizures, syncope, facial asymmetry, speech difficulty, weakness, light-headedness, numbness and headaches.    Psychiatric/Behavioral: Positive for sleep disturbance. Negative for dysphoric mood, self-injury and suicidal ideas. The patient is nervous/anxious.     Past Medical History:  Diagnosis Date  . Asthma   . Chronic pain syndrome   . Crohn disease [facility])    Past Surgical History:  Procedure Laterality Date  . ABDOMINAL SURGERY    . surgery for Crohns     Allergies  Allergen Reactions  . Benadryl [Diphenhydramine Hcl] Anaphylaxis  . Broccoli [Brassica Oleracea Italica] Other (See Comments)    Causes terrible gas.   [name] [Shellfish Allergy] Diarrhea and Nausea And Vomiting  . Sulfa Antibiotics Hives  . Tylenol [Acetaminophen] Nausea And Vomiting   Current Outpatient Prescriptions  Medication Sig Dispense Refill  . albuterol (PROVENTIL HFA;VENTOLIN HFA) 108 (90 BASE) MCG/ACT inhaler Inhale 2 puffs into the lungs every 6 (six) hours as needed for wheezing or shortness of breath. 1 Inhaler 1  . HYDROCORTISONE ACE, RECTAL, 30 MG SUPP Place 1 suppository (30 mg total) rectally 2 (two) times daily. As directed 28 each 1  . hydrocortisone-pramoxine (PROCTOFOAM-HC) rectal foam Place 1 applicator rectally 2 (two) times daily. 10 g 1  . hydrOXYzine (ATARAX/VISTARIL) 25 MG tablet Take 1 tablet (25 mg total) by mouth 3 (three) times daily as needed for anxiety. 60 tablet 0  . oxyCODONE (OXY IR/ROXICODONE) 5 MG immediate release tablet Take 1 tablet (5 mg total) by mouth every 6 (six) hours as needed for severe pain. 56 tablet 0  . PARoxetine (PAXIL) 20 MG tablet Take 1 tablet (20 mg total) by mouth daily. 30 tablet 5  . promethazine (PHENERGAN) 25 MG tablet Take 1  tablet (25 mg total) by mouth 3 (three) times daily. 45 tablet 0  . temazepam (RESTORIL) 15 MG capsule Take 1 capsule (15 mg total) by mouth daily. 30 capsule 0  . tiZANidine (ZANAFLEX) 4 MG tablet TAKE 1 TABLET (4 MG TOTAL) BY MOUTH EVERY 6 (SIX) HOURS AS NEEDED FOR MUSCLE SPASMS. 40 tablet 5  . doxycycline (VIBRAMYCIN) 100 MG  capsule Take 1 capsule (100 mg total) by mouth 2 (two) times daily. 20 capsule 0   No current facility-administered medications for this visit.    Social History   Social History  . Marital status: Single    Spouse name: N/A  . Number of children: N/A  . Years of education: N/A   Occupational History  . Not on file.   Social History Main Topics  . Smoking status: Current Every Day Smoker    Packs/day: 0.50    Years: 16.00    Types: Cigarettes  . Smokeless tobacco: Never Used  . Alcohol use 0.0 oz/week  . Drug use:     Types: Marijuana  . Sexual activity: Not on file   Other Topics Concern  . Not on file   Social History Narrative  . No narrative on file   No family history on file.     Objective:    BP 132/88 (BP Location: Right Arm, Patient Position: Sitting, Cuff Size: Normal)   Pulse 91   Temp 98.1 F (36.7 C) (Oral)   Resp 16   Ht 5\' 10"  (1.778 m)   Wt 149 lb (67.6 kg)   SpO2 98%   BMI 21.38 kg/m  Physical Exam  Constitutional: He is oriented to person, place, and time. He appears well-developed and well-nourished. No distress.  HENT:  Head: Normocephalic and atraumatic.  Right Ear: External ear normal.  Left Ear: External ear normal.  Nose: Nose normal.  Mouth/Throat: Oropharynx is clear and moist.  R lower tooth with decay and surrounding erythema; no fluctuance.  Eyes: Conjunctivae and EOM are normal. Pupils are equal, round, and reactive to light.  Neck: Normal range of motion. Neck supple. Carotid bruit is not present. No thyromegaly present.  Cardiovascular: Normal rate, regular rhythm, normal heart sounds and intact distal pulses.  Exam reveals no gallop and no friction rub.   No murmur heard. Pulmonary/Chest: Effort normal and breath sounds normal. He has no wheezes. He has no rales.  Abdominal: Soft. Bowel sounds are normal. He exhibits no distension and no mass. There is no tenderness. There is no rebound and no guarding.  Lymphadenopathy:      He has no cervical adenopathy.  Neurological: He is alert and oriented to person, place, and time. No cranial nerve deficit.  Skin: Skin is warm and dry. No rash noted. He is not diaphoretic.  Psychiatric: He has a normal mood and affect. His behavior is normal.  Nursing note and vitals reviewed.       Assessment & Plan:   1. Chronic pain syndrome   2. Crohn's disease of intestine, other complication ([place])   3. Chronic abdominal pain   4. Intestinal bypass or anastomosis status   5. Opioid dependence with other opioid-induced disorder ([place])   6. Insomnia   7. Severe episode of recurrent major depressive disorder, without psychotic features ([place])   8. Dental infection    -stable.  Upcoming appointment with GI in upcoming two months; will provide current dose of narcotics while awaiting GI consultation; refill of oxycodone provided. -rx for Doxycycline provided  for R dental infection. -appears less anxious and depressed today; continue Paxil. -wean Temazepam until gone. Refill of Atarax for anxiety; refill of Phenergan for nausea.    Orders Placed This Encounter  Procedures  . Pain Mgmt, Profile 8 w/Conf, U   Meds ordered this encounter  Medications  . promethazine (PHENERGAN) 25 MG tablet    Sig: Take 1 tablet (25 mg total) by mouth 3 (three) times daily.    Dispense:  45 tablet    Refill:  0  . hydrOXYzine (ATARAX/VISTARIL) 25 MG tablet    Sig: Take 1 tablet (25 mg total) by mouth 3 (three) times daily as needed for anxiety.    Dispense:  60 tablet    Refill:  0  . doxycycline (VIBRAMYCIN) 100 MG capsule    Sig: Take 1 capsule (100 mg total) by mouth 2 (two) times daily.    Dispense:  20 capsule    Refill:  0  . oxyCODONE (OXY IR/ROXICODONE) 5 MG immediate release tablet    Sig: Take 1 tablet (5 mg total) by mouth every 6 (six) hours as needed for severe pain.    Dispense:  56 tablet    Refill:  0    DO NOT FILL UNTIL 08-31-2015    Return in about 2 weeks  (around 09/12/2015) for recheck.   Fitzhugh Vizcarrondo Elayne Guerin, M.D. [facility] [address] [place], [place]  [address] [phone] phone [phone] fax

## 2015-08-29 NOTE — Telephone Encounter (Signed)
Added an account note for this patient about his balance, he stated that billing is having to keep going in a clearing his account out because it isnt being sent to his insurance companies right. Please look in account notes to see whole message. Thank you

## 2015-09-04 LAB — PAIN MGMT, PROFILE 8 W/CONF, U
6 ACETYLMORPHINE: POSITIVE ng/mL — AB (ref <10)
6 Acetylmorphine: 1695 ng/mL — ABNORMAL HIGH (ref <10)
ALCOHOL METABOLITES: NEGATIVE ng/mL (ref <500)
ALPHAHYDROXYTRIAZOLAM: NEGATIVE ng/mL (ref <50)
AMINOCLONAZEPAM: NEGATIVE ng/mL (ref <25)
AMPHETAMINES: NEGATIVE ng/mL (ref <500)
Alphahydroxyalprazolam: 243 ng/mL — ABNORMAL HIGH (ref <25)
Alphahydroxymidazolam: NEGATIVE ng/mL (ref <50)
BENZOYLECGONINE: 67465 ng/mL — AB (ref <100)
Benzodiazepines: POSITIVE ng/mL — AB (ref <100)
Buprenorphine: NEGATIVE ng/mL (ref <5)
CODEINE: 652 ng/mL — AB (ref <50)
Cocaine Metabolite: POSITIVE ng/mL — AB (ref <150)
Creatinine: 117.9 mg/dL (ref >=20.0)
HYDROXYETHYLFLURAZEPAM: NEGATIVE ng/mL (ref <50)
Hydrocodone: NEGATIVE ng/mL (ref <50)
Hydromorphone: 194 ng/mL — ABNORMAL HIGH (ref <50)
Lorazepam: NEGATIVE ng/mL (ref <50)
MARIJUANA METABOLITE: POSITIVE ng/mL — AB (ref <20)
MDMA: NEGATIVE ng/mL (ref <500)
Marijuana Metabolite: 85 ng/mL — ABNORMAL HIGH (ref <5)
Morphine: >50000 ng/mL — ABNORMAL HIGH (ref <50)
NORDIAZEPAM: NEGATIVE ng/mL (ref <50)
Norhydrocodone: NEGATIVE ng/mL (ref <50)
OPIATES: POSITIVE ng/mL — AB (ref <100)
OXAZEPAM: NEGATIVE ng/mL (ref <50)
OXIDANT: NEGATIVE ug/mL (ref <200)
OXYCODONE: NEGATIVE ng/mL (ref <100)
PH: 6.12 (ref 4.5–9.0)
PLEASE NOTE: 0
Temazepam: NEGATIVE ng/mL (ref <50)

## 2015-09-28 ENCOUNTER — Ambulatory Visit: Payer: Medicare Other | Admitting: Family Medicine

## 2015-10-20 ENCOUNTER — Ambulatory Visit: Payer: Medicare Other | Admitting: Gastroenterology

## 2017-05-24 ENCOUNTER — Encounter: Payer: Self-pay | Admitting: Family Medicine

## 2017-05-29 ENCOUNTER — Encounter: Payer: Self-pay | Admitting: Family Medicine

## 2017-06-09 IMAGING — DX DG ABDOMEN ACUTE W/ 1V CHEST
3 series · 3 of 3 positions shown · non-contrast
Comparison: None

CLINICAL DATA: RIGHT lower quadrant pain, vomiting, diarrhea,
Crohn's disease

EXAM:
DG ABDOMEN ACUTE W/ 1V CHEST

[chest pa]
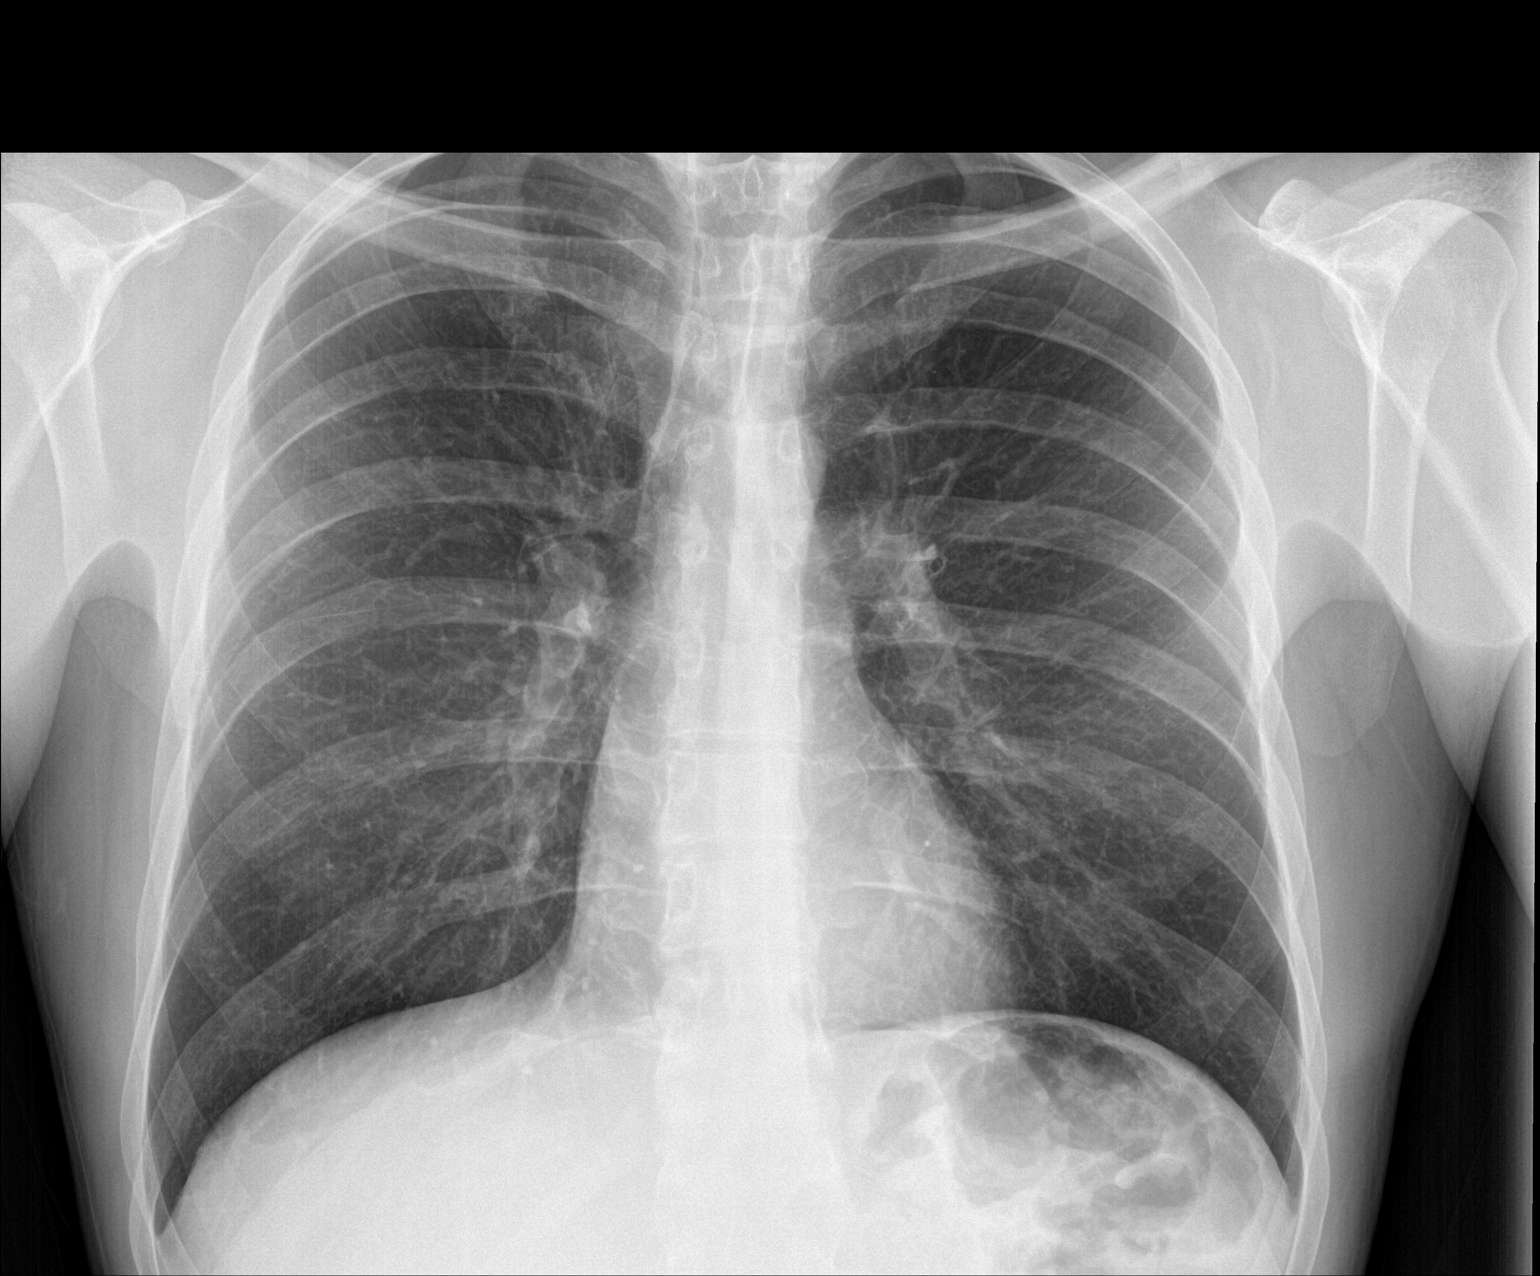

[abdomen erect]
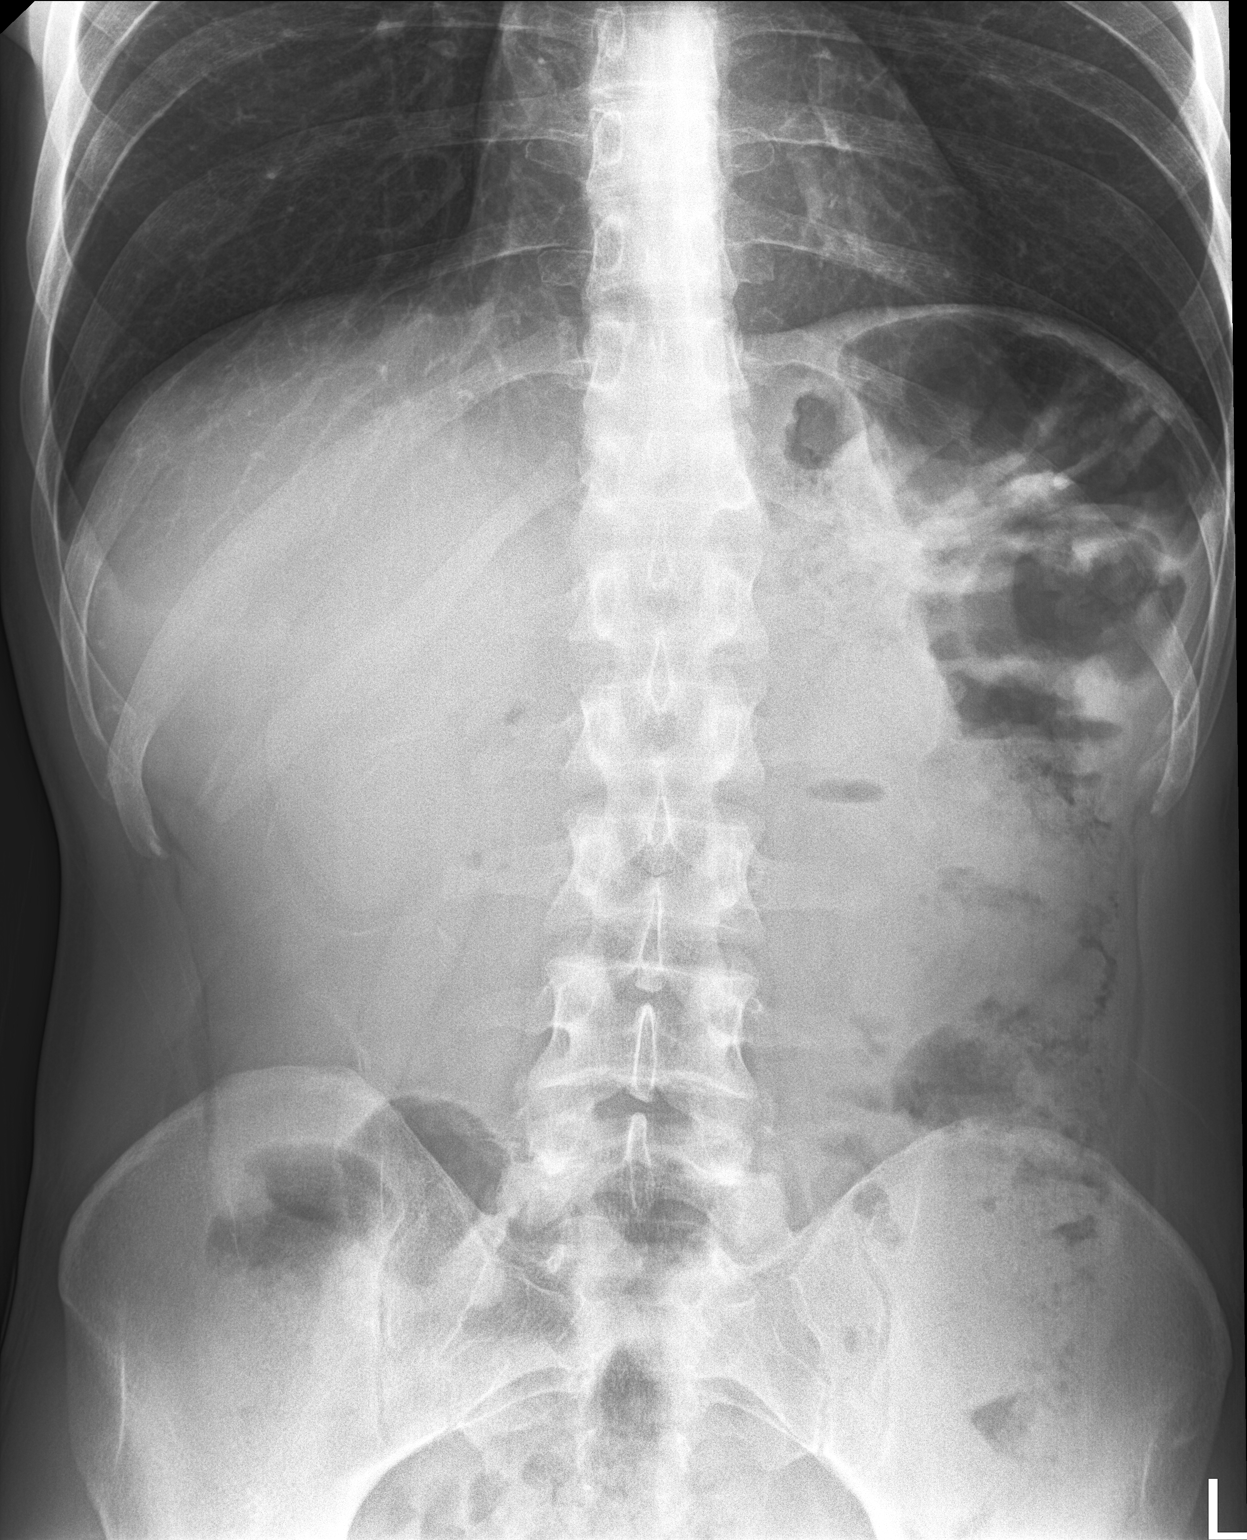

[abdomen supine]
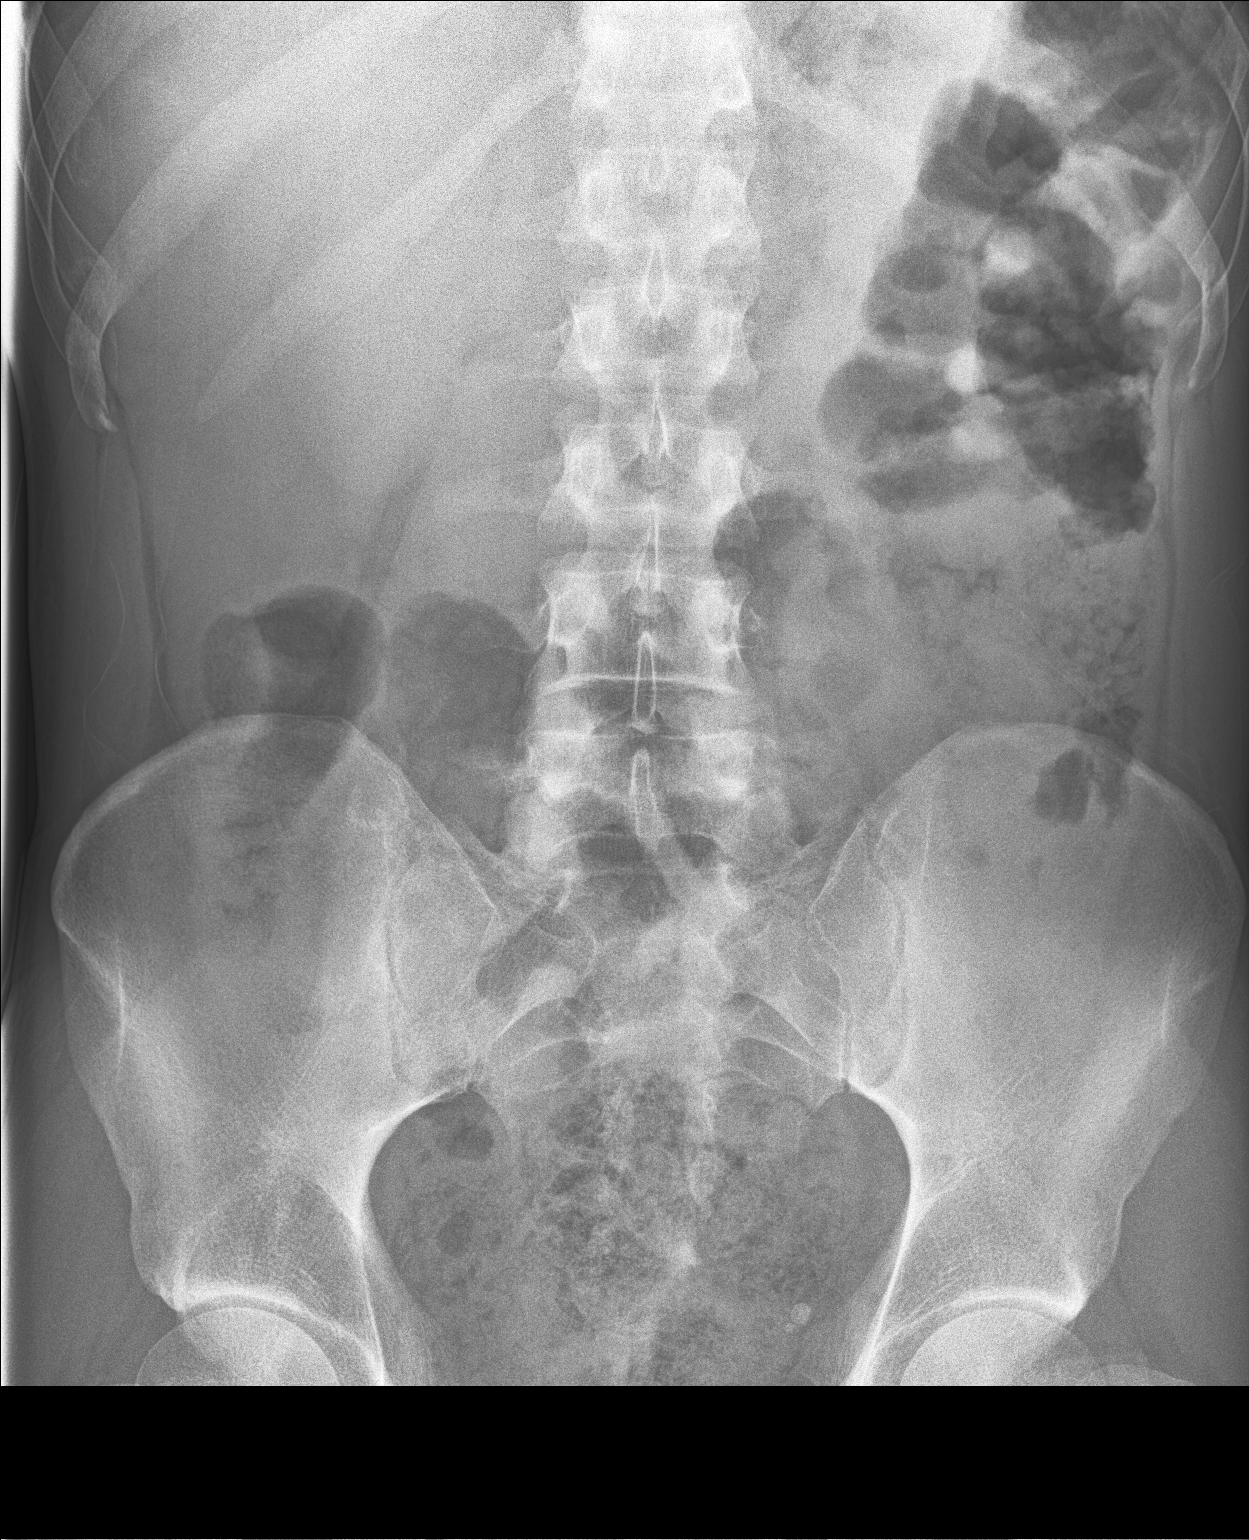

[3 of 3 positions shown; findings below may reference images not displayed]

FINDINGS: Normal heart size, mediastinal contours, and pulmonary vascularity.

Minimal bronchitic changes.

Lungs clear.

No pleural effusion or pneumothorax.

Significantly increased stool in rectum.

Bowel gas pattern otherwise normal.

No bowel dilatation or bowel wall thickening or free air.

LEFT pelvic phleboliths.

No acute osseous findings.
IMPRESSION: Bronchitic changes.

Significantly increased stool in rectum.

Otherwise normal bowel gas pattern.

## 2019-10-22 ENCOUNTER — Emergency Department (HOSPITAL_COMMUNITY)
Admission: EM | Admit: 2019-10-22 | Discharge: 2019-10-22 | Disposition: A | Discharge status: Home / Self Care | Payer: Medicare Other | Attending: Emergency Medicine | Admitting: Emergency Medicine

## 2019-10-22 ENCOUNTER — Encounter (HOSPITAL_COMMUNITY): Payer: Self-pay

## 2019-10-22 ENCOUNTER — Other Ambulatory Visit: Payer: Self-pay

## 2019-10-22 DIAGNOSIS — F17210 Nicotine dependence, cigarettes, uncomplicated: Secondary | ICD-10-CM | POA: Insufficient documentation

## 2019-10-22 DIAGNOSIS — Z79899 Other long term (current) drug therapy: Secondary | ICD-10-CM | POA: Insufficient documentation

## 2019-10-22 DIAGNOSIS — R400 Somnolence: Secondary | ICD-10-CM | POA: Insufficient documentation

## 2019-10-22 DIAGNOSIS — T40721A Poisoning by synthetic cannabinoids, accidental (unintentional), initial encounter: Secondary | ICD-10-CM | POA: Insufficient documentation

## 2019-10-22 DIAGNOSIS — T50901A Poisoning by unspecified drugs, medicaments and biological substances, accidental (unintentional), initial encounter: Secondary | ICD-10-CM

## 2019-10-22 DIAGNOSIS — R5383 Other fatigue: Secondary | ICD-10-CM | POA: Insufficient documentation

## 2019-10-22 DIAGNOSIS — J45909 Unspecified asthma, uncomplicated: Secondary | ICD-10-CM | POA: Insufficient documentation

## 2019-10-22 LAB — CBC WITH DIFFERENTIAL/PLATELET
Abs Immature Granulocytes: 0.02 10*3/uL (ref 0.00–0.07)
Basophils Absolute: 0.1 10*3/uL (ref 0.0–0.1)
Basophils Relative: 1 %
Eosinophils Absolute: 0.4 10*3/uL (ref 0.0–0.5)
Eosinophils Relative: 4 %
HCT: 38.7 % — ABNORMAL LOW (ref 39.0–52.0)
Hemoglobin: 12.2 g/dL — ABNORMAL LOW (ref 13.0–17.0)
Immature Granulocytes: 0 %
Lymphocytes Relative: 23 %
Lymphs Abs: 2.1 10*3/uL (ref 0.7–4.0)
MCH: 30.7 pg (ref 26.0–34.0)
MCHC: 31.5 g/dL (ref 30.0–36.0)
MCV: 97.5 fL (ref 80.0–100.0)
Monocytes Absolute: 0.7 10*3/uL (ref 0.1–1.0)
Monocytes Relative: 7 %
Neutro Abs: 5.9 10*3/uL (ref 1.7–7.7)
Neutrophils Relative %: 65 %
Platelets: 217 10*3/uL (ref 150–400)
RBC: 3.97 MIL/uL — ABNORMAL LOW (ref 4.22–5.81)
RDW: 13.4 % (ref 11.5–15.5)
WBC: 9.2 10*3/uL (ref 4.0–10.5)
nRBC: 0 % (ref 0.0–0.2)

## 2019-10-22 LAB — BASIC METABOLIC PANEL
Anion gap: 9 (ref 5–15)
BUN: 11 mg/dL (ref 6–20)
CO2: 25 mmol/L (ref 22–32)
Calcium: 8.4 mg/dL — ABNORMAL LOW (ref 8.9–10.3)
Chloride: 108 mmol/L (ref 98–111)
Creatinine, Ser: 0.83 mg/dL (ref 0.61–1.24)
GFR, Estimated: >60 mL/min (ref >60)
Glucose, Bld: 103 mg/dL — ABNORMAL HIGH (ref 70–99)
Potassium: 4.2 mmol/L (ref 3.5–5.1)
Sodium: 142 mmol/L (ref 135–145)

## 2019-10-22 LAB — ETHANOL: Alcohol, Ethyl (B): <10 mg/dL (ref <10)

## 2019-10-22 LAB — TROPONIN I (HIGH SENSITIVITY): Troponin I (High Sensitivity): 4 ng/L (ref <18)

## 2019-10-22 NOTE — Discharge Instructions (Signed)
You were evaluated today for loss of consciousness after doing drugs at home.   Evaluation of your heart and your blood work were very reassuring.  I feel you are safe to be discharged at this time, as you are able to eat and drink and walk on your own.  Please return the emergency department if develop any new chest pain, difficulty breathing, palpitations, or passing out again.

## 2019-10-22 NOTE — ED Triage Notes (Signed)
[facility] after taking k2. Staff reported to EMS that "patient was unresponsive, but wouldn't let them give him narcan". Pt A&Ox4, following commands on arrival to ED Denies CP. Some ST elevation  Present on EKG.   100/61 HR 61 98% RA CBG 117  #20 LAC

## 2019-10-22 NOTE — ED Provider Notes (Signed)
[facility] EMERGENCY DEPARTMENT Provider Note   CSN: [id number] Arrival date & time: 10/22/19  1342     History Chief Complaint  Patient presents with  . Drug Overdose    Seth Foster is a 39 y.o. male who presents via EMS from halfway house where he reportedly was unconscious after taking K2.  Narcan was not administered.  At the time of my initial interview with the patient he is alert and oriented x4.  He is able to follow commands for me.  He is denying chest pain, shortness of breath, palpitations, abdominal pain, nausea, vomiting, headache, fall.  He states that he feels fine, he is just tired.  He states that he took "Duece" this morning, has not taken anything else for a few days.  Reports use of heroin, methamphetamine, cocaine in the past.  Denies alcohol use.  Patient is somnolent but arousable to voice.  Reports he remembers smoking "a really small piece of paper with just a little duece" this morning, does not recall events around episode of loss of consciousness, or EMS arrival to the hospital.  I have reviewed patient's medical records.  History of Crohn's, major depressive disorder, opioid abuse, cocaine abuse, tobacco abuse.   HPI     Past Medical History:  Diagnosis Date  . Asthma   . Chronic pain syndrome   . Crohn disease [facility])     Patient Active Problem List   Diagnosis Date Noted  . Cocaine abuse ([place]) 06/08/2015  . Opioid dependence ([place]) 06/01/2015  . Insomnia 12/27/2014  . Reactive airway disease 12/27/2014  . Pilonidal abscess 12/27/2014  . Chronic abdominal pain 01/05/2013  . Major depressive disorder 12/16/2012  . Crohn's disease of intestine ([place]) 04/10/2012  . Current tobacco use 03/07/2011  . Acid reflux 03/03/2011  . 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 ABDOMINAL SURGERY    . surgery for Crohns         No family history on  file.  Social History   Tobacco Use  . Smoking status: Current Every Day Smoker    Packs/day: 0.50    Years: 16.00    Pack years: 8.00    Types: Cigarettes  . Smokeless tobacco: Never Used  Substance Use Topics  . Alcohol use: Yes    Alcohol/week: 0.0 standard drinks  . Drug use: Yes    Types: Marijuana    Home Medications Prior to Admission medications   Medication Sig Start Date End Date Taking? Authorizing Provider  albuterol (PROVENTIL HFA;VENTOLIN HFA) 108 (90 BASE) MCG/ACT inhaler Inhale 2 puffs into the lungs every 6 (six) hours as needed for wheezing or shortness of breath. 12/27/14  Yes Leandrew Koyanagi, MD  doxycycline (VIBRAMYCIN) 100 MG capsule Take 1 capsule (100 mg total) by mouth 2 (two) times daily. Patient not taking: Reported on 10/22/2019 08/29/15   Wardell Honour, MD  HYDROCORTISONE ACE, RECTAL, 30 MG SUPP Place 1 suppository (30 mg total) rectally 2 (two) times daily. As directed Patient not taking: Reported on 10/22/2019 12/27/14   Leandrew Koyanagi, MD  hydrocortisone-pramoxine [facility]) rectal foam Place 1 applicator rectally 2 (two) times daily. Patient not taking: Reported on 10/22/2019 12/27/14   Leandrew Koyanagi, MD  hydrOXYzine (ATARAX/VISTARIL) 25 MG tablet Take 1 tablet (25 mg total) by mouth 3 (three) times daily as needed for anxiety. Patient not taking: Reported on 10/22/2019 08/29/15   [name]  M, MD  oxyCODONE (OXY IR/ROXICODONE) 5 MG immediate release tablet Take 1 tablet (5 mg total) by mouth every 6 (six) hours as needed for severe pain. Patient not taking: Reported on 10/22/2019 08/29/15   Wardell Honour, MD  PARoxetine (PAXIL) 20 MG tablet Take 1 tablet (20 mg total) by mouth daily. Patient not taking: Reported on 10/22/2019 07/27/15   Wardell Honour, MD  promethazine (PHENERGAN) 25 MG tablet Take 1 tablet (25 mg total) by mouth 3 (three) times daily. Patient not taking: Reported on 10/22/2019 08/29/15   Wardell Honour, MD   temazepam (RESTORIL) 15 MG capsule Take 1 capsule (15 mg total) by mouth daily. Patient not taking: Reported on 10/22/2019 07/27/15   Wardell Honour, MD  tiZANidine (ZANAFLEX) 4 MG tablet TAKE 1 TABLET (4 MG TOTAL) BY MOUTH EVERY 6 (SIX) HOURS AS NEEDED FOR MUSCLE SPASMS. Patient not taking: Reported on 10/22/2019 07/07/15   Wardell Honour, MD    Allergies    Benadryl [diphenhydramine hcl], Eual Fines Marta Lamas oleracea], Scallops [shellfish allergy], Sulfa antibiotics, and Tylenol [acetaminophen]  Review of Systems   Review of Systems  Constitutional: Positive for fatigue. Negative for appetite change, diaphoresis and fever.  HENT: Negative.   Respiratory: Negative for cough, chest tightness and shortness of breath.   Cardiovascular: Negative for chest pain, palpitations and leg swelling.  Gastrointestinal: Negative for abdominal pain, diarrhea, nausea and vomiting.  Genitourinary: Negative for dysuria.  Musculoskeletal: Negative.   Neurological: Positive for syncope. Negative for dizziness, weakness, light-headedness and headaches.  Psychiatric/Behavioral: Negative for hallucinations, sleep disturbance and suicidal ideas.       Denies HI    Physical Exam Updated Vital Signs BP 108/67   Pulse 60   Temp 97.8 F (36.6 C) (Oral)   Resp 14   SpO2 100%   Physical Exam Vitals and nursing note reviewed.  HENT:     Head: Normocephalic and atraumatic.     Mouth/Throat:     Mouth: Mucous membranes are moist.     Pharynx: No oropharyngeal exudate or posterior oropharyngeal erythema.  Eyes:     General:        Right eye: No discharge.        Left eye: No discharge.     Extraocular Movements: Extraocular movements intact.     Conjunctiva/sclera: Conjunctivae normal.     Pupils: Pupils are equal, round, and reactive to light.  Cardiovascular:     Rate and Rhythm: Normal rate and regular rhythm.     Pulses: Normal pulses.     Heart sounds: Normal heart sounds. No murmur heard.    Pulmonary:     Effort: Pulmonary effort is normal. No respiratory distress.     Breath sounds: Normal breath sounds. No wheezing or rales.  Abdominal:     General: There is no distension.     Palpations: Abdomen is soft.     Tenderness: There is no abdominal tenderness.  Musculoskeletal:        General: No deformity.     Cervical back: Neck supple. No rigidity or tenderness.     Right lower leg: No edema.     Left lower leg: No edema.  Skin:    General: Skin is warm and dry.     Capillary Refill: Capillary refill takes less than 2 seconds.  Neurological:     General: No focal deficit present.     Mental Status: He is oriented to person, place, and time. Mental status is  at baseline.     Sensory: Sensation is intact. No sensory deficit.     Motor: Motor function is intact.     Comments: Patient arousable with verbal stimulus  Psychiatric:        Mood and Affect: Mood normal.      ED Results / Procedures / Treatments   Labs (all labs ordered are listed, but only abnormal results are displayed) Labs Reviewed  CBC WITH DIFFERENTIAL/PLATELET - Abnormal; Notable for the following components:      Result Value   RBC 3.97 (*)    Hemoglobin 12.2 (*)    HCT 38.7 (*)    All other components within normal limits  BASIC METABOLIC PANEL - Abnormal; Notable for the following components:   Glucose, Bld 103 (*)    Calcium 8.4 (*)    All other components within normal limits  ETHANOL  TROPONIN I (HIGH SENSITIVITY)    EKG EKG Interpretation  Date/Time:  Thursday October 22 2019 13:45:26 EDT Ventricular Rate:  60 PR Interval:    QRS Duration: 112 QT Interval:  395 QTC Calculation: 395 R Axis:   59 Text Interpretation: Sinus rhythm Borderline intraventricular conduction delay ST elev, probable normal early repol pattern no acute STEMI no prior for comparison Confirmed by Madalyn Rob [phone]) on 10/22/2019 2:29:07 PM   Radiology No results found.  Procedures Procedures  (including critical care time)  Medications Ordered in ED Medications - No data to display  ED Course  I have reviewed the triage vital signs and the nursing notes.  Pertinent labs & imaging results that were available during my care of the patient were reviewed by me and considered in my medical decision making (see chart for details).    MDM Rules/Calculators/A&P                         Patient with witnessed intake of K2, synthetic cannabinoid.  Patient verbally confirmed intake of this drug this morning.  He states he does not have any other drugs in his system, has not taken anything else for the last few days.   Vital signs are normal.   CBG 117  EKG with sinus rhythm, ST elevations noted likely benign early repolarization, confirmed by attending physician  At time of my exam, patient is somnolent but arousable with verbal stimulus. Unable to provide history surrounding episode of LOC, however verbally confirms intake of "Duece". I do not feel patient is alert enough at this time to be discharged.    Will order CBC, BMP, troponin, UDS  If patient can ambulate, tolerates PO intake, may discharge pending lab results.   CBC with anemia 12.2, previously 12.3.   Ethanol <10.   BMP with mild hypocalcemia 8.4  Troponin negative 4.   I reevaluated the patient, he is much more alert at this time.  Vital signs remained stable on the monitor. He is agreeable to eating and drinking something. Patient reports he does not need to urinate, does not want to provide urine sample. At this point, I do not feel a UDS will change my management of this patient.   The patient is more alert, able to ambulate independently, and is tolerating PO intake of food and drink. I do not feel any further work-up in the emergency department necessary at this time.  The patient's vital signs have remained stable throughout his stay in the emergency department.  He has become increasingly more alert,  responsive, and  interactive.  He is able to tolerate p.o. and ambulate independently. Given reassuring labs and EKG, I feel this patient is safe to be discharged.   Discussed plan to discharge the patient; he voiced understanding, and each of his questions were answered to his expressed satisfaction. Return precautions given.  Patient is stable for discharge.  Final Clinical Impression(s) / ED Diagnoses Final diagnoses:  Accidental drug overdose, initial encounter    Rx / DC Orders ED Discharge Orders    None       Aura Dials 10/22/19 1550    Lucrezia Starch, MD 10/27/19 [phone]

## 2019-10-22 NOTE — ED Notes (Signed)
Pt contacting his half way house for ride home. Gave pt discharge instructions and paperwork. Pt agreeable to discharge. Pt is ambulatory, alert and oriented. Pt able to eat his sandwich and drink the sprite I gave him.

## 2020-05-11 DIAGNOSIS — K50919 Crohn's disease, unspecified, with unspecified complications: Principal | ICD-10-CM

## 2020-09-08 ENCOUNTER — Ambulatory Visit: Admit: 2020-09-08 | Discharge: 2020-09-09 | Payer: MEDICAID | Attending: Gastroenterology | Primary: Gastroenterology

## 2020-09-08 DIAGNOSIS — K50919 Crohn's disease, unspecified, with unspecified complications: Principal | ICD-10-CM

## 2020-09-08 DIAGNOSIS — Z23 Encounter for immunization: Secondary | ICD-10-CM | POA: Diagnosis not present

## 2020-09-08 MED ORDER — MERCAPTOPURINE 50 MG TABLET
ORAL_TABLET | Freq: Every day | ORAL | 0 refills | 30.00000 days | Status: CP
Start: 2020-09-08 — End: 2020-10-08

## 2020-09-08 MED ORDER — HUMIRA PEN CITRATE FREE STARTER PACK FOR CROHN'S/UC/HS 3 X 80 MG/0.8 ML
PACK | 0 refills | 0 days | Status: CP
Start: 2020-09-08 — End: ?
  Filled 2020-11-07: qty 3, 28d supply, fill #0

## 2020-09-08 MED ORDER — HUMIRA PEN CITRATE FREE 40 MG/0.4 ML
SUBCUTANEOUS | 1 refills | 84 days | Status: CP
Start: 2020-09-08 — End: ?

## 2020-09-09 DIAGNOSIS — K50919 Crohn's disease, unspecified, with unspecified complications: Principal | ICD-10-CM

## 2020-09-12 DIAGNOSIS — Z9889 Other specified postprocedural states: Secondary | ICD-10-CM | POA: Diagnosis not present

## 2020-09-12 DIAGNOSIS — Z20822 Contact with and (suspected) exposure to covid-19: Secondary | ICD-10-CM | POA: Diagnosis not present

## 2020-09-12 DIAGNOSIS — K828 Other specified diseases of gallbladder: Secondary | ICD-10-CM | POA: Diagnosis not present

## 2020-09-12 DIAGNOSIS — K3189 Other diseases of stomach and duodenum: Secondary | ICD-10-CM | POA: Diagnosis not present

## 2020-09-12 DIAGNOSIS — R590 Localized enlarged lymph nodes: Secondary | ICD-10-CM | POA: Diagnosis not present

## 2020-09-12 DIAGNOSIS — F17210 Nicotine dependence, cigarettes, uncomplicated: Secondary | ICD-10-CM | POA: Diagnosis not present

## 2020-09-12 DIAGNOSIS — D509 Iron deficiency anemia, unspecified: Secondary | ICD-10-CM | POA: Diagnosis not present

## 2020-09-12 DIAGNOSIS — R109 Unspecified abdominal pain: Secondary | ICD-10-CM | POA: Diagnosis not present

## 2020-09-12 DIAGNOSIS — K5010 Crohn's disease of large intestine without complications: Secondary | ICD-10-CM | POA: Diagnosis not present

## 2020-09-12 DIAGNOSIS — R935 Abnormal findings on diagnostic imaging of other abdominal regions, including retroperitoneum: Secondary | ICD-10-CM | POA: Diagnosis not present

## 2020-09-12 DIAGNOSIS — K6389 Other specified diseases of intestine: Secondary | ICD-10-CM | POA: Diagnosis not present

## 2020-09-12 DIAGNOSIS — K5090 Crohn's disease, unspecified, without complications: Secondary | ICD-10-CM | POA: Diagnosis not present

## 2020-09-12 DIAGNOSIS — K5000 Crohn's disease of small intestine without complications: Secondary | ICD-10-CM | POA: Diagnosis not present

## 2020-09-12 DIAGNOSIS — K810 Acute cholecystitis: Secondary | ICD-10-CM | POA: Diagnosis not present

## 2020-09-12 DIAGNOSIS — K50919 Crohn's disease, unspecified, with unspecified complications: Secondary | ICD-10-CM | POA: Diagnosis not present

## 2020-09-12 DIAGNOSIS — Z9049 Acquired absence of other specified parts of digestive tract: Secondary | ICD-10-CM | POA: Diagnosis not present

## 2020-09-12 DIAGNOSIS — R1084 Generalized abdominal pain: Secondary | ICD-10-CM | POA: Diagnosis not present

## 2020-09-13 DIAGNOSIS — K5000 Crohn's disease of small intestine without complications: Secondary | ICD-10-CM | POA: Diagnosis not present

## 2020-09-13 DIAGNOSIS — K5090 Crohn's disease, unspecified, without complications: Secondary | ICD-10-CM | POA: Diagnosis not present

## 2020-09-13 DIAGNOSIS — R935 Abnormal findings on diagnostic imaging of other abdominal regions, including retroperitoneum: Secondary | ICD-10-CM | POA: Diagnosis not present

## 2020-09-13 DIAGNOSIS — K810 Acute cholecystitis: Secondary | ICD-10-CM | POA: Diagnosis not present

## 2020-09-13 DIAGNOSIS — D509 Iron deficiency anemia, unspecified: Secondary | ICD-10-CM | POA: Diagnosis not present

## 2020-09-13 DIAGNOSIS — Z9889 Other specified postprocedural states: Secondary | ICD-10-CM | POA: Diagnosis not present

## 2020-09-13 DIAGNOSIS — Z9049 Acquired absence of other specified parts of digestive tract: Secondary | ICD-10-CM | POA: Diagnosis not present

## 2020-09-14 DIAGNOSIS — K810 Acute cholecystitis: Secondary | ICD-10-CM | POA: Diagnosis not present

## 2020-09-14 DIAGNOSIS — K828 Other specified diseases of gallbladder: Secondary | ICD-10-CM | POA: Diagnosis not present

## 2020-09-14 DIAGNOSIS — R109 Unspecified abdominal pain: Secondary | ICD-10-CM | POA: Diagnosis not present

## 2020-09-14 DIAGNOSIS — K5090 Crohn's disease, unspecified, without complications: Secondary | ICD-10-CM | POA: Diagnosis not present

## 2020-09-14 DIAGNOSIS — K5000 Crohn's disease of small intestine without complications: Secondary | ICD-10-CM | POA: Diagnosis not present

## 2020-09-14 DIAGNOSIS — D509 Iron deficiency anemia, unspecified: Secondary | ICD-10-CM | POA: Diagnosis not present

## 2020-09-14 NOTE — Unmapped (Signed)
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  Will f/u in 2 weeks to see if latent TB treatment has started and reschedule outreach call appropriately.    Oliva Bustard, PharmD  [facility]

## 2020-09-14 NOTE — Unmapped (Signed)
[facility] Specialty Medication Onboarding    Specialty Medication: Humira starter kit and maintenance  Prior Authorization: Approved   Financial Assistance: No - copay  <$25  Final Copay/Day Supply: $0 / 28 days (for load and maintenance)    Insurance Restrictions: Yes - max 1 month supply     Notes to Pharmacist:     The triage team has completed the benefits investigation and has determined that the patient is able to fill this medication at [facility]. Please contact the patient to complete the onboarding or follow up with the prescribing physician as needed.

## 2020-09-14 NOTE — Unmapped (Signed)
09/08/2020, QuantiFERON-TB positive, HepBsAg negative, CRP 6 mg/L, B12 367  09/12/2020, CTAP.  Gallbladder wall thickening.  Marked thickening of the terminal ileum x20 cm.  2 cm mesenteric lymphadenopathy  09/12/2020, right upper quadrant ultrasound.  Gallbladder sludge, wall thickening.    Discussed results with the patient on the phone.  He is currently hospitalized.  Recommend follow-up with [facility] health clinic ASAP upon discharge to discuss treatment for LTBI.  After 1 month of LTBI treatment, we will start adalimumab as previously discussed while he finishes the remainder of LTBI treatment.  He has not yet started 6-MP.  Recommend that he start that upon discharge.  Okay to take a steroid taper for the short-term treatment of his ileitis.  Continue to follow with his inpatient doctors about management of potential cholecystitis.

## 2020-09-23 DIAGNOSIS — L0591 Pilonidal cyst without abscess: Secondary | ICD-10-CM | POA: Diagnosis not present

## 2020-09-23 DIAGNOSIS — R110 Nausea: Secondary | ICD-10-CM | POA: Diagnosis not present

## 2020-09-23 DIAGNOSIS — K5090 Crohn's disease, unspecified, without complications: Secondary | ICD-10-CM | POA: Diagnosis not present

## 2020-09-23 DIAGNOSIS — K644 Residual hemorrhoidal skin tags: Secondary | ICD-10-CM | POA: Diagnosis not present

## 2020-09-26 NOTE — Unmapped (Signed)
Faxed TB results to [facility] dept, Beckey Rutter fax [phone], phone [phone].

## 2020-09-30 ENCOUNTER — Other Ambulatory Visit: Payer: Self-pay | Admitting: Obstetrics and Gynecology

## 2020-09-30 ENCOUNTER — Ambulatory Visit
Admission: RE | Admit: 2020-09-30 | Discharge: 2020-09-30 | Disposition: A | Discharge status: Home / Self Care | Payer: No Typology Code available for payment source | Source: Ambulatory Visit | Attending: Obstetrics and Gynecology | Admitting: Obstetrics and Gynecology

## 2020-09-30 DIAGNOSIS — R7611 Nonspecific reaction to tuberculin skin test without active tuberculosis: Secondary | ICD-10-CM

## 2020-10-03 DIAGNOSIS — K50919 Crohn's disease, unspecified, with unspecified complications: Principal | ICD-10-CM

## 2020-10-10 DIAGNOSIS — R194 Change in bowel habit: Secondary | ICD-10-CM | POA: Diagnosis not present

## 2020-10-10 DIAGNOSIS — L988 Other specified disorders of the skin and subcutaneous tissue: Secondary | ICD-10-CM | POA: Diagnosis not present

## 2020-10-10 DIAGNOSIS — F17200 Nicotine dependence, unspecified, uncomplicated: Secondary | ICD-10-CM | POA: Diagnosis not present

## 2020-10-10 DIAGNOSIS — K50019 Crohn's disease of small intestine with unspecified complications: Secondary | ICD-10-CM | POA: Diagnosis not present

## 2020-10-10 DIAGNOSIS — R1031 Right lower quadrant pain: Secondary | ICD-10-CM | POA: Diagnosis not present

## 2020-10-24 NOTE — Unmapped (Signed)
Per Dr. Stevphen Rochester request, LM for patient to see where things stand in terms of need for LTBI treatment which has Humira start on hold.  Asked patient to update me on current status bc health dept was repeating testing and questioning positive result.  If TB confirmed, patient will need LTBI treatment, after 4 weeks patient can start Humira.

## 2020-10-25 NOTE — Unmapped (Signed)
Patient called to notify that the last 2 tests done at the health dept were negative for TB, he was having follow up visit with them on 10/17 and will notify of update post visit.

## 2020-10-28 NOTE — Unmapped (Addendum)
[facility]   Patient Onboarding/Medication Counseling    Dakota Townsend is a 40 y.o. male with crohn's disease who I am counseling today on initiation of therapy.  I am speaking to the patient.    Was a Nurse, learning disability used for this call? No    Verified patient's date of birth / HIPAA.    Specialty medication(s) to be sent: Inflammatory Disorders: Humira      Non-specialty medications/supplies to be sent: sharps container      Medications not needed at this time: n/a       The patient declined counseling on medication administration, missed dose instructions, goals of therapy and warnings and precautions because they have taken the medication previously. The information in the declined sections below are for informational purposes only and was not discussed with patient.       Humira (adalimumab)    Medication & Administration     Dosage: Crohn's Disease: Inject 160mg  under the skin on day 1, 80mg  on day 15, then 40mg  every 14 days starting on day 29    Lab tests required prior to treatment initiation:  ??? Tuberculosis: Tuberculosis screening initially tested positive but 2 additional test conducted by the health dept were negative for TB (see documentation in media tab from 10/18). GI provider is aware and wants to initiate treatment at this time.  ??? Hepatitis B: Hepatitis B serology studies are complete and non-reactive. (Only surface antigen is non-reactive)    Administration:     Prefilled auto-injector pen  1. Gather all supplies needed for injection on a clean, flat working surface: medication pen removed from packaging, alcohol swab, sharps container, etc.  2. Look at the medication label - look for correct medication, correct dose, and check the expiration date  3. Look at the medication - the liquid visible in the window on the side of the pen device should appear clear and colorless  4. Lay the auto-injector pen on a flat surface and allow it to warm up to room temperature for at least 30-45 minutes  5. Select injection site - you can use the front of your thigh or your belly (but not the area 2 inches around your belly button); if someone else is giving you the injection you can also use your upper arm in the skin covering your triceps muscle  6. Prepare injection site - wash your hands and clean the skin at the injection site with an alcohol swab and let it air dry, do not touch the injection site again before the injection  7. Pull the 2 safety caps straight off - gray/white to uncover the needle cover and the plum cap to uncover the plum activator button, do not remove until immediately prior to injection and do not touch the white needle cover  8. Gently squeeze the area of cleaned skin and hold it firmly to create a firm surface at the selected injection site  9. Put the white needle cover against your skin at the injection site at a 90 degree angle, hold the pen such that you can see the clear medication window  10. Press down and hold the pen firmly against your skin, press the plum activator button to initiate the injection, there will be a click when the injection starts  11. Continue to hold the pen firmly against your skin for about 10-15 seconds - the window will start to turn solid yellow  12. To verify the injection is complete after 10-15 seconds,  look and ensure the window is solid yellow and then pull the pen away from your skin  13. Dispose of the used auto-injector pen immediately in your sharps disposal container the needle will be covered automatically  14. If you see any blood at the injection site, press a cotton ball or gauze on the site and maintain pressure until the bleeding stops, do not rub the injection site    Adherence/Missed dose instructions:  If your injection is given more than 3 days after your scheduled injection date - consult your pharmacist for additional instructions on how to adjust your dosing schedule.    Goals of Therapy     - Achieve remission of symptoms  - Maintain remission of symptoms  - Minimize long-term systemic glucocorticoid use  - Prevent need for surgical procedures  - Maintenance of effective psychosocial functioning    Side Effects & Monitoring Parameters     ??? Injection site reaction (redness, irritation, inflammation localized to the site of administration)  ??? Signs of a common cold - minor sore throat, runny or stuffy nose, etc.  ??? Upset stomach  ??? Headache    The following side effects should be reported to the provider:  ??? Signs of a hypersensitivity reaction - rash; hives; itching; red, swollen, blistered, or peeling skin; wheezing; tightness in the chest or throat; difficulty breathing, swallowing, or talking; swelling of the mouth, face, lips, tongue, or throat; etc.  ??? Reduced immune function - report signs of infection such as fever; chills; body aches; very bad sore throat; ear or sinus pain; cough; more sputum or change in color of sputum; pain with passing urine; wound that will not heal, etc.  Also at a slightly higher risk of some malignancies (mainly skin and blood cancers) due to this reduced immune function.  o In the case of signs of infection - the patient should hold the next dose of Humira?? and call your primary care provider to ensure adequate medical care.  Treatment may be resumed when infection is treated and patient is asymptomatic.  ??? Changes in skin - a new growth or lump that forms; changes in shape, size, or color of a previous mole or marking  ??? Signs of unexplained bruising or bleeding - throwing up blood or emesis that looks like coffee grounds; black, tarry, or bloody stool; etc.  ??? Signs of new or worsening heart failure - shortness of breath; sudden weight gain; heartbeat that is not normal; swelling in the arms or legs that is new or worse      Contraindications, Warnings, & Precautions     ??? Have your bloodwork checked as you have been told by your prescriber  ??? Talk with your doctor if you are pregnant, planning to become pregnant, or breastfeeding  ??? Discuss the possible need for holding your dose(s) of Humira?? when a planned procedure is scheduled with the prescriber as it may delay healing/recovery timeline       Drug/Food Interactions     ??? Medication list reviewed in Epic. The patient was instructed to inform the care team before taking any new medications or supplements. No drug interactions identified.   ??? Talk with you prescriber or pharmacist before receiving any live vaccinations while taking this medication and after you stop taking it    Storage, Handling Precautions, & Disposal     ??? Store this medication in the refrigerator.  Do not freeze  ??? If needed, you may store at room temperature  for up to 14 days  ??? Store in original packaging, protected from light  ??? Do not shake  ??? Dispose of used syringes/pens in a sharps disposal container            Current Medications (including OTC/herbals), Comorbidities and Allergies     Current Outpatient Medications   Medication Sig Dispense Refill   ??? divalproex ER (DEPAKOTE ER) 500 MG extended released 24 hr tablet TAKE 2 TABLETS BY MOUTH WITH SUPPER DAILY     ??? doxycycline (VIBRA-TABS) 100 MG tablet Take 100 mg by mouth Two (2) times a day.     ??? empty container Misc Use as directed 1 each 3   ??? folic acid (FOLVITE) 1 MG tablet Take 1 tablet (1 mg total) by mouth daily. 30 tablet 11   ??? HUMIRA PEN CITRATE FREE 40 MG/0.4 ML Inject the contents of 1 pen (40 mg total) under the skin every fourteen (14) days. 6 each 1   ??? HUMIRA PEN CITRATE FREE STARTER PACK FOR CROHN'S/UC/HS 3 X 80 MG/0.8 ML Inject the contents of 2 pens (160 mg) under the skin on day 1, then 1 pen (80 mg) on day 15. THEN 1 pen (40 mg) every 2 weeks 3 each 0   ??? lamoTRIgine (LAMICTAL) 100 MG tablet Take 100 mg by mouth daily.     ??? loperamide (IMODIUM) 2 mg capsule Take 2 mg by mouth continuous as needed for diarrhea.     ??? promethazine (PHENERGAN) 25 MG tablet 0.5-1 tab po q 8hrs prn nausea 90 tablet 5   ??? QUEtiapine (SEROQUEL) 25 MG tablet 25 mg nightly.       No current facility-administered medications for this visit.       Allergies   Allergen Reactions   ??? Acetaminophen Nausea And Vomiting   ??? Benadryl [Diphenhydramine Hcl] Anaphylaxis   ??? Sulfa (Sulfonamide Antibiotics) Hives   ??? Rozerem [Ramelteon] Other (See Comments)     Pt reports hallucinations, muscle contortions  & dry eyes       Patient Active Problem List   Diagnosis   ??? Crohn's disease of intestine (CMS-HCC)   ??? Tobacco use disorder   ??? Intestinal obstruction (CMS-HCC)   ??? Intestinal bypass or anastomosis status   ??? Nausea with vomiting   ??? Esophageal reflux   ??? Major depression   ??? Marijuana use   ??? Chronic abdominal pain       Reviewed and up to date in Epic.    Appropriateness of Therapy     Acute infections noted within Epic:  No active infections  Patient reported infection: Patient has a periodontal cyst and is currently taking doxycline 100 mg BID - will finish treatment in 3-4 days.   Is medication and dose appropriate based on diagnosis and infection status? Yes    Prescription has been clinically reviewed: Yes      Baseline Quality of Life Assessment      How many days over the past month did your crohn's disease  keep you from your normal activities? For example, brushing your teeth or getting up in the morning. Mr. Thoresen states he is experiencing an acute flare-up with increased bowel movements and frequent night sweats. He states that Humira helped control GI symptoms in the past and is ready to restart treatment    Financial Information     Medication Assistance provided: Prior Authorization     Anticipated copay of $0 / 28 days reviewed with patient.  Verified delivery address.    Delivery Information     Scheduled delivery date: 11/08/20    Expected start date: 11/08/20    Medication will be delivered via UPS to the prescription address in [facility].  This shipment will not require a signature.      Explained the services we provide at [facility] and that each month we would call to set up refills.  Stressed importance of returning phone calls so that we could ensure they receive their medications in time each month.  Informed patient that we should be setting up refills 7-10 days prior to when they will run out of medication.  A pharmacist will reach out to perform a clinical assessment periodically.  Informed patient that a welcome packet, containing information about our pharmacy and other support services, a Notice of Privacy Practices, and a drug information handout will be sent.      The patient or caregiver noted above participated in the development of this care plan and knows that they can request review of or adjustments to the care plan at any time.      Patient or caregiver verbalized understanding of the above information as well as how to contact the pharmacy at [phone] option 4 with any questions/concerns.  The pharmacy is open Monday through Friday 8:30am-4:30pm.  A pharmacist is available 24/7 via pager to answer any clinical questions they may have.    Patient Specific Needs     - Does the patient have any physical, cognitive, or cultural barriers? No    - Does the patient have adequate living arrangements? (i.e. the ability to store and take their medication appropriately) Yes    - Did you identify any home environmental safety or security hazards? No    - Patient prefers to have medications discussed with  Patient     - Is the patient or caregiver able to read and understand education materials at a high school level or above? No    - Patient's primary language is  English     - Is the patient high risk? No    - Does the patient require physician intervention or other additional services (i.e. dietary/nutrition, smoking cessation, social work)? No      Oliva Bustard  [facility] Specialty Pharmacist

## 2020-10-28 NOTE — Unmapped (Signed)
See media tab 10/25/2020. Health dep does not think patient needs LTBI.  Ok to start Humira now per Dr. Stevphen Rochester.  [place] [facility] SP notified to proceed.

## 2020-10-31 DIAGNOSIS — K50919 Crohn's disease, unspecified, with unspecified complications: Principal | ICD-10-CM

## 2020-11-03 MED ORDER — EMPTY CONTAINER
3 refills | 0 days
Start: 2020-11-03 — End: ?

## 2020-11-07 MED FILL — EMPTY CONTAINER: 120 days supply | Qty: 1 | Fill #0

## 2020-11-24 NOTE — Unmapped (Signed)
[facility] Clinical Assessment & Refill Coordination Note    Dakota Townsend, DOB: 07/17/1980  Phone: There are no phone numbers on file.    All above HIPAA information was verified with patient.     Was a Nurse, learning disability used for this call? No    Specialty Medication(s):   Inflammatory Disorders: Humira     Current Outpatient Medications   Medication Sig Dispense Refill   ??? divalproex ER (DEPAKOTE ER) 500 MG extended released 24 hr tablet TAKE 2 TABLETS BY MOUTH WITH SUPPER DAILY     ??? empty container Misc Use as directed 1 each 3   ??? folic acid (FOLVITE) 1 MG tablet Take 1 tablet (1 mg total) by mouth daily. 30 tablet 11   ??? HUMIRA PEN CITRATE FREE 40 MG/0.4 ML Inject the contents of 1 pen (40 mg total) under the skin every fourteen (14) days. 6 each 1   ??? HUMIRA PEN CITRATE FREE STARTER PACK FOR CROHN'S/UC/HS 3 X 80 MG/0.8 ML Inject the contents of 2 pens (160 mg) under the skin on day 1, then 1 pen (80 mg) on day 15. THEN 1 pen (40 mg) every 2 weeks 3 each 0   ??? lamoTRIgine (LAMICTAL) 100 MG tablet Take 100 mg by mouth daily.     ??? loperamide (IMODIUM) 2 mg capsule Take 2 mg by mouth continuous as needed for diarrhea.     ??? promethazine (PHENERGAN) 25 MG tablet 0.5-1 tab po q 8hrs prn nausea 90 tablet 5   ??? QUEtiapine (SEROQUEL) 25 MG tablet 25 mg nightly.       No current facility-administered medications for this visit.        Changes to medications: Dakota Townsend reports no changes at this time.    Allergies   Allergen Reactions   ??? Acetaminophen Nausea And Vomiting   ??? Benadryl [Diphenhydramine Hcl] Anaphylaxis   ??? Sulfa (Sulfonamide Antibiotics) Hives   ??? Rozerem [Ramelteon] Other (See Comments)     Pt reports hallucinations, muscle contortions  & dry eyes       Changes to allergies: No    SPECIALTY MEDICATION ADHERENCE     Humira 40 mg/0.61mL: 0 days of medicine on hand     Medication Adherence    Patient reported X missed doses in the last month: 0  Specialty Medication: Humira 80mg  - Injects 160mg  on day 1 and then 80mg  on day 15  Patient is on additional specialty medications: No  Informant: patient          Specialty medication(s) dose(s) confirmed: Regimen is correct and unchanged.     Are there any concerns with adherence? No    Adherence counseling provided? Not needed    CLINICAL MANAGEMENT AND INTERVENTION      Clinical Benefit Assessment:    Do you feel the medicine is effective or helping your condition? He was previously well controlled on Humira over a year ago and understands it may take a few months to see full clinical benefiit    Clinical Benefit counseling provided? Not needed    Adverse Effects Assessment:    Are you experiencing any side effects? Yes, patient reports experiencing pain when injecting Humira with last injection. He reports also experiencing night sweats that he thinks are more atrributed to crohn's.. Side effect counseling provided: We discussed how he had pain when injecting in his belly but did not have pain when injecting in his thigh. Therefore, I suggested that he continue  to use his thighs as the site of injection and to alternate between his thighs each time.      Are you experiencing difficulty administering your medicine? Yes, patient reports a quarter of the medication leaked and was not administered. Contact [employer] nurse ambassador to report this issue. . Medication administration counseling provided: I reinforced that since he received some medicaiton, he would not need another injection. He appropriately contacted the mfg to inform them of this issue.      Quality of Life Assessment:    Quality of Life    Rheumatology  Oncology  Dermatology  Cystic Fibrosis          How many days over the past month did your crohn's disease  keep you from your normal activities? For example, brushing your teeth or getting up in the morning. Mr. Wigger endorses experiencing night sweats that are bothersome.    Have you discussed this with your provider? Not needed    Acute Infection Status:    Acute infections noted within Epic:  No active infections  Patient reported infection: None    Therapy Appropriateness:    Is therapy appropriate and patient progressing towards therapeutic goals? Yes, therapy is appropriate and should be continued    DISEASE/MEDICATION-SPECIFIC INFORMATION      For patients on injectable medications: Patient currently has 0 doses left.  Next injection is scheduled for 12/07/20.    PATIENT SPECIFIC NEEDS     - Does the patient have any physical, cognitive, or cultural barriers? No    - Is the patient high risk? No    - Does the patient require a Care Management Plan? No     - Does the patient require physician intervention or other additional services (i.e. nutrition, smoking cessation, social work)? No      SHIPPING     Specialty Medication(s) to be Shipped:   Inflammatory Disorders: Humira    Other medication(s) to be shipped: No additional medications requested for fill at this time     Changes to insurance: No    Delivery Scheduled: Yes, Expected medication delivery date: 11/29/20.     Medication will be delivered via UPS to the confirmed prescription address in [facility].    The patient will receive a drug information handout for each medication shipped and additional FDA Medication Guides as required.  Verified that patient has previously received a Conservation officer, historic buildings and a Surveyor, mining.    The patient or caregiver noted above participated in the development of this care plan and knows that they can request review of or adjustments to the care plan at any time.      All of the patient's questions and concerns have been addressed.    Oliva Bustard   [facility] Specialty Pharmacist

## 2020-11-28 DIAGNOSIS — K50919 Crohn's disease, unspecified, with unspecified complications: Principal | ICD-10-CM

## 2020-11-28 MED FILL — HUMIRA PEN CITRATE FREE 40 MG/0.4 ML: SUBCUTANEOUS | 28 days supply | Qty: 2 | Fill #0

## 2020-11-29 DIAGNOSIS — K50919 Crohn's disease, unspecified, with unspecified complications: Secondary | ICD-10-CM | POA: Diagnosis not present

## 2020-11-30 LAB — C-REACTIVE PROTEIN: C-REACTIVE PROTEIN: 2 mg/L (ref 0–10)

## 2020-11-30 LAB — CBC
HEMATOCRIT: 43.2 % (ref 37.5–51.0)
HEMOGLOBIN: 14.1 g/dL (ref 13.0–17.7)
MEAN CORPUSCULAR HEMOGLOBIN CONC: 32.6 g/dL (ref 31.5–35.7)
MEAN CORPUSCULAR HEMOGLOBIN: 29.1 pg (ref 26.6–33.0)
MEAN CORPUSCULAR VOLUME: 89 fL (ref 79–97)
PLATELET COUNT: 296 10*3/uL (ref 150–450)
RED BLOOD CELL COUNT: 4.84 x10E6/uL (ref 4.14–5.80)
RED CELL DISTRIBUTION WIDTH: 12.6 % (ref 11.6–15.4)
WHITE BLOOD CELL COUNT: 12.2 10*3/uL — ABNORMAL HIGH (ref 3.4–10.8)

## 2020-11-30 LAB — ALT: ALT (SGPT): 15 IU/L (ref 0–44)

## 2020-12-02 MED ORDER — MERCAPTOPURINE 50 MG TABLET
ORAL_TABLET | 0 refills | 0 days
Start: 2020-12-02 — End: ?

## 2020-12-05 MED ORDER — MERCAPTOPURINE 50 MG TABLET
ORAL_TABLET | 2 refills | 0 days | Status: CP
Start: 2020-12-05 — End: ?

## 2020-12-05 NOTE — Unmapped (Signed)
Labs up to date. refills authorized.

## 2020-12-21 NOTE — Unmapped (Signed)
[facility] Refill Coordination Note    Specialty Medication(s) to be Shipped:   Inflammatory Disorders: Humira    Other medication(s) to be shipped: No additional medications requested for fill at this time     Dakota Townsend, DOB: 04-01-1980  Phone: There are no phone numbers on file.      All above HIPAA information was verified with patient.     Was a Nurse, learning disability used for this call? No    Completed refill call assessment today to schedule patient's medication shipment from the [facility] [phone]).  All relevant notes have been reviewed.     Specialty medication(s) and dose(s) confirmed: Regimen is correct and unchanged.   Changes to medications: Dakota Townsend reports no changes at this time.  Changes to insurance: No  New side effects reported not previously addressed with a pharmacist or physician: None reported  Questions for the pharmacist: No    Confirmed patient received a Conservation officer, historic buildings and a Surveyor, mining with first shipment. The patient will receive a drug information handout for each medication shipped and additional FDA Medication Guides as required.       DISEASE/MEDICATION-SPECIFIC INFORMATION        For patients on injectable medications: Patient currently has 0 doses left.  Next injection is scheduled for 01/03/21.    SPECIALTY MEDICATION ADHERENCE     Medication Adherence    Patient reported X missed doses in the last month: 0  Specialty Medication: HUMIRA(CF)  Patient is on additional specialty medications: No              Were doses missed due to medication being on hold? No    humira 40/0.4 mg/ml: 0 days of medicine on hand        REFERRAL TO PHARMACIST     Referral to the pharmacist: Not needed      [facility]     Shipping address confirmed in Epic.     Delivery Scheduled: Yes, Expected medication delivery date: 12/30/20.     Medication will be delivered via UPS to the prescription address in Epic [facility].    Unk Lightning   [facility] Specialty Technician

## 2020-12-26 DIAGNOSIS — K50919 Crohn's disease, unspecified, with unspecified complications: Principal | ICD-10-CM

## 2020-12-28 DIAGNOSIS — K50919 Crohn's disease, unspecified, with unspecified complications: Principal | ICD-10-CM

## 2020-12-29 MED FILL — HUMIRA PEN CITRATE FREE 40 MG/0.4 ML: SUBCUTANEOUS | 28 days supply | Qty: 2 | Fill #1

## 2021-01-19 NOTE — Unmapped (Signed)
[facility] Refill Coordination Note    Specialty Medication(s) to be Shipped:   Inflammatory Disorders: Humira    Other medication(s) to be shipped: No additional medications requested for fill at this time     Dakota Townsend, DOB: 1980-02-15  Phone: There are no phone numbers on file.      All above HIPAA information was verified with patient.     Was a Nurse, learning disability used for this call? No    Completed refill call assessment today to schedule patient's medication shipment from the [facility] [phone]).  All relevant notes have been reviewed.     Specialty medication(s) and dose(s) confirmed: Regimen is correct and unchanged.   Changes to medications: Less reports no changes at this time.  Changes to insurance: No  New side effects reported not previously addressed with a pharmacist or physician: None reported  Questions for the pharmacist: No    Confirmed patient received a Conservation officer, historic buildings and a Surveyor, mining with first shipment. The patient will receive a drug information handout for each medication shipped and additional FDA Medication Guides as required.       DISEASE/MEDICATION-SPECIFIC INFORMATION        For patients on injectable medications: Patient currently has 0 doses left.  Next injection is scheduled for 1/24.    SPECIALTY MEDICATION ADHERENCE     Medication Adherence    Patient reported X missed doses in the last month: 0  Specialty Medication: HUMIRA(CF) PEN 40 mg/0.4 mL  Patient is on additional specialty medications: No              Were doses missed due to medication being on hold? No        REFERRAL TO PHARMACIST     Referral to the pharmacist: Not needed      [facility]     Shipping address confirmed in Epic.     Delivery Scheduled: Yes, Expected medication delivery date: 1/19.     Medication will be delivered via UPS to the prescription address in Epic W[facility].    Westley Gambles   [facility] Specialty Technician

## 2021-01-23 DIAGNOSIS — K50919 Crohn's disease, unspecified, with unspecified complications: Principal | ICD-10-CM

## 2021-01-25 MED FILL — HUMIRA PEN CITRATE FREE 40 MG/0.4 ML: SUBCUTANEOUS | 28 days supply | Qty: 2 | Fill #2

## 2021-02-09 ENCOUNTER — Ambulatory Visit: Admit: 2021-02-09 | Discharge: 2021-02-09 | Payer: MEDICARE | Attending: Gastroenterology | Primary: Gastroenterology

## 2021-02-09 DIAGNOSIS — K50919 Crohn's disease, unspecified, with unspecified complications: Principal | ICD-10-CM

## 2021-02-09 DIAGNOSIS — F17200 Nicotine dependence, unspecified, uncomplicated: Secondary | ICD-10-CM | POA: Diagnosis not present

## 2021-02-09 LAB — CBC
HEMATOCRIT: 38.2 % — ABNORMAL LOW (ref 39.0–48.0)
HEMOGLOBIN: 12.6 g/dL — ABNORMAL LOW (ref 12.9–16.5)
MEAN CORPUSCULAR HEMOGLOBIN CONC: 33.1 g/dL (ref 32.0–36.0)
MEAN CORPUSCULAR HEMOGLOBIN: 29.6 pg (ref 25.9–32.4)
MEAN CORPUSCULAR VOLUME: 89.3 fL (ref 77.6–95.7)
MEAN PLATELET VOLUME: 9.4 fL (ref 6.8–10.7)
PLATELET COUNT: 319 10*9/L (ref 150–450)
RED BLOOD CELL COUNT: 4.28 10*12/L (ref 4.26–5.60)
RED CELL DISTRIBUTION WIDTH: 14.6 % (ref 12.2–15.2)
WBC ADJUSTED: 9.3 10*9/L (ref 3.6–11.2)

## 2021-02-09 LAB — C-REACTIVE PROTEIN: C-REACTIVE PROTEIN: <4 mg/L (ref <=10.0)

## 2021-02-09 LAB — ALT: ALT (SGPT): 19 U/L (ref 10–49)

## 2021-02-09 MED ORDER — MERCAPTOPURINE 50 MG TABLET
ORAL_TABLET | ORAL | 2 refills | 0.00000 days | Status: CP
Start: 2021-02-09 — End: ?

## 2021-02-09 NOTE — Unmapped (Signed)
REFERRING PHYSICIAN:   Mauri Brooklyn, PA    REASON FOR VISIT:   Crohn's    HISTORY OF PRESENT ILLNESS:  Since last visit,  09/08/2020, QuantiFERON-TB positive, HepBsAg negative, CRP 6 mg/L, B12 367  09/12/2020, CTAP.  Gallbladder wall thickening.  Marked thickening of the terminal ileum x20 cm.  2 cm mesenteric lymphadenopathy  09/12/2020, right upper quadrant ultrasound.  Gallbladder sludge, wall thickening.  09/14/2020, HIDA scan.  Normal.  Recommend follow-up with [facility] health clinic to initiate treatment of LTBI.  10/28/2020, [facility] health department determined patient did not need treatment for LTBI based on 2 repeat negative tests.  Start adalimumab    I reviewed and summarized previous medical records from the referring physician and Epic Care Everywhere and recorded the summary in Test Data section below.  The history of the present illness was obtained from the patient and his mother.  41 year old male who I am seeing in consultation at the request of Mauri Brooklyn for Crohn's disease.  I previously followed this patient with last clinic visit in October 2015.  Currently, he is off therapy.  He has 5 watery bowel movements per day while taking 8 mg of Imodium per day.  Occasional red blood in stool.  This is his baseline stool frequency and character.  He has new onset worsening abdominal pain.  Has baseline abdominal soreness in the periumbilical region.  However, every 2 weeks on average, he experiences severe worsening of periumbilical abdominal pain that lasts 2 to 3 hours and is associated with abdominal bloating, nausea, vomiting.  He believes that these episodes are brought on by certain foods.  He seemed to be worse in the last 2 months.  He also endorses fatigue that is worse over the last 2 months.  He has new onset rash on the skin underlying his beard.  It is erythematous and scaly.    MEDICATIONS:  has a current medication list which includes the following prescription(s): divalproex er, empty container, folic acid, humira(cf) pen, humira(cf) pen [id number], lamotrigine, loperamide, mercaptopurine, promethazine, and quetiapine.    ALLERGIES:  Allergies as of 02/09/2021 - Reviewed 01/19/2021   Allergen Reaction Noted   ??? Acetaminophen Nausea And Vomiting 04/27/2012   ??? Benadryl [diphenhydramine hcl] Anaphylaxis 04/27/2012   ??? Sulfa (sulfonamide antibiotics) Hives 04/27/2012   ??? Rozerem [ramelteon] Other (See Comments) 05/11/2014       PAST MEDICAL HISTORY:  1. Crohn's disease diagnosed as ileocolitis in 2007, treated initially with Pentasa and prednisone. Infliximab with partial response. Imuran with no help. Infusion reaction during infliximab retreatment. Adalimumab provided no benefit. Certolizumab with mild improvement but plateaued despite once weekly  dosing. In 12/2007, CT with ileitis, internal fistula and abscess. 02/04/08, ielocolic resection (40cm ti) by Dr. [place] Sever in [place]. 07/2008, diarrhea. 05/03/09, thickening of the neoterminal   ileum on CT scan. 6-MP started 50 mg per day. 05/03/09, start adalimumab. 06/01/09, 6-MP increased to 75 mg. 07/19/09, desipramine to 100 mg per day without benefit. 11/10/2009, 6 inches of ileal resection (stricture) with primary anastomosis. 12/20/2009, reluctant to restart immunosuppression. 03/21/10, stopped desipramine due to lack of benefit and start Cymbalta. 05/25/10 Colonoscopy with i3 recurrence. Restart 6-MP 75mg , but declined adalimumab due to toe infections. 03/01/11, 6 inches of small bowel resected just proximal to the anastomosis. 05/22/11 start oral methotrexate. 06/04/11, CT abdomen and pelvis showed small-bowel dilation to 3.4 cm, prominent thickening of the ileocolonic anastomosis. Start ustekinumab. 10/26/11, colonoscopy showed minimal improvement. MTX switched to 25mg  .  02/20/13, colonoscopy  showed I2 disease (possibly mildly improved).  Patient self stopped methotrexate due to perceived increase infectious complications.  Continued ustekinumab monotherapy.  2017, stopped ustekinumab due to incarceration.  2018, worsening symptoms, start adalimumab in prison with improvement.  08/2019, stopped adalimumab upon release from prison.  Worsening symptoms.  2. 06/2014, pilonidal cyst  3. Bipolar disorder  4. Substance use disorder    SOCIAL HISTORY:  Smokes 1 pack/day.  Works at [employer].    FAMILY HISTORY:  No colon cancer    ROS: Pertinent positives and negatives are documented as per the HPI; all other systems reviewed are negative.    PHYSICAL EXAM:  There were no vitals taken for this visit.  Wt Readings from Last 4 Encounters:   09/08/20 69.3 kg (152 lb 12.8 oz)   06/15/14 64 kg (141 lb)   05/11/14 65.3 kg (144 lb)   04/29/14 62.6 kg (138 lb)     CONSTITUTIONAL: awake, alert, NAD  EYES: no scleral icterus or conjunctival injection  EARS, NOSE, MOUTH, THROAT: no oral lesions  RESPIRATORY: clear to auscultation bilaterally, normal respiratory motions  CARDIOVASCULAR: regular rate, rhythm  GASTROINTESTINAL: Abdomen is soft, mild to moderate right midabdomen tenderness, nondistended, no mass  MUSCULOSKELETAL: normal muscle tone, no knee or finger joint effusions  NEUROLOGIC: oriented x 3  SKIN: warm, dry.  Erythematous skin beneath beard.    TEST DATA:  1. 05/03/09, CT scan with thickening of neoterminal ileum and subcm mesenteric lymphadenopathy. No stranding, abscess, or fistula.   2.08/26/2009, EGD was normal.   3.08/26/2009, colonoscopy to the neoterminal ileum. Poor bowel prep. Edema, erythema and shallow ulcerations at ICA and neoterminal ileum. Benign-appearing mild stenosis just proximal to ICA that was not traversed.  4. 10/28/2009, small bowel follow through showed 4-hour transit time, adherent small bowel loops in the pelvis that were difficult to separate, 1.5-2 cm segment of stricture in the neoterminal ileum that was 4 mm in diameter, more proximal 2.25 cm of neoterminal ileum was mildly narrowed.   5. 05/20/10, colonoscopy to the neoti. Slightly improved compared to prior pre-op colonoscopy.   9. 02/20/13, colonoscopy. Normal ICA. i2 disease in the distal 6 cm of TI.  Slightly improved.  10. 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  09/12/2020, CTAP.  Gallbladder wall thickening.  Marked thickening of the terminal ileum x20 cm.  2 cm mesenteric lymphadenopathy  12.  09/12/2020, right upper quadrant ultrasound.  Gallbladder sludge, wall thickening.  13.  09/14/2020, HIDA scan.  Normal.    ASSESSMENT:  1. Ileal Crohn's disease.  Stricturing phenotype.  Currently mildly symptomatic on adalimumab and 6-MP.  Could be due to active Crohn's versus IBS.  Check adalimumab levels today.  Patient has missed 1 6-MP dose in the last 5 days so will not check thiopurine metabolites today.  We can do these at the time of the colonoscopy.  Reviewed risks including risks of infection, malignancy, hepatitis, pancreatitis, cytopenias, psoriasis, inflammatory reactions in the liver, nerves.  These medicines require intensive monitoring for toxicity.  Plan to restage Crohn's disease activity with a colonoscopy now.  Continue Imodium and antiemetics as needed for symptom control.  May need bile salt binder in the future to reduce diarrhea.  2.  By patient report, Imuran caused possible rash and flushing, 6-MP and methotrexate increased infections.   3.  Medically immune suppressed, in need of vaccination.  4.  Tobacco use disorder.  Smoking cessation counseling provided.  5.  Cyst  versus abscess at top of gluteal cleft.  Probably due to his known pilonidal cyst.  Doubt Crohn's fistula at this point.  Recommend surgical referral and evaluation.    RECOMMENDATIONS AND PLAN:  Patient Instructions   -Continue current meds including humira, 6mp, imodium.  I sent in 6mp refills today.  -Call [phone] to schedule colonoscopy asap.  -Bloodwork today.  -I will refer you to [facility] colorectal surgery to discuss management of cyst. You will be contacted to schedule.  -Bloodwork at [facility] every 3 months indefinitely.  -Continue to work with your psych doctors about medication adjustments.  -Continue to work on stopping smoking  -Recommend flu shot each October and covid-19 vaccine series.  -Shingrix shingles vaccine #1 today.  -Clinic visit with me in 4 months or sooner if needed.

## 2021-02-09 NOTE — Unmapped (Addendum)
-  Continue current meds including humira, 6mp, imodium.  I sent in 6mp refills today.  -Call [phone] to schedule colonoscopy asap.  -Bloodwork today.  -I will refer you to [facility] colorectal surgery to discuss management of cyst. You will be contacted to schedule.  -Bloodwork at [facility] every 3 months indefinitely.  -Continue to work with your psych doctors about medication adjustments.  -Continue to work on stopping smoking  -Recommend flu shot each October and covid-19 vaccine series.  -Shingrix shingles vaccine #1 today.  -Clinic visit with me in 4 months or sooner if needed.

## 2021-02-13 LAB — ADALIMUMAB/ADALIMUMAB AB: ADALIMUMAB: 10.2 ug/mL

## 2021-02-16 DIAGNOSIS — R69 Illness, unspecified: Secondary | ICD-10-CM | POA: Diagnosis not present

## 2021-02-16 NOTE — Unmapped (Signed)
[facility] Refill Coordination Note    Specialty Medication(s) to be Shipped:   Inflammatory Disorders: Humira    Other medication(s) to be shipped: No additional medications requested for fill at this time     Dakota Townsend, DOB: 17-Apr-1980  Phone: There are no phone numbers on file.      All above HIPAA information was verified with patient.     Was a Nurse, learning disability used for this call? No    Completed refill call assessment today to schedule patient's medication shipment from the [facility] [phone]).  All relevant notes have been reviewed.     Specialty medication(s) and dose(s) confirmed: Regimen is correct and unchanged.   Changes to medications: Alija reports no changes at this time.  Changes to insurance: No  New side effects reported not previously addressed with a pharmacist or physician: None reported  Questions for the pharmacist: No    Confirmed patient received a Conservation officer, historic buildings and a Surveyor, mining with first shipment. The patient will receive a drug information handout for each medication shipped and additional FDA Medication Guides as required.       DISEASE/MEDICATION-SPECIFIC INFORMATION        For patients on injectable medications: Patient currently has 0 doses left.  Next injection is scheduled for 02/28/21.    SPECIALTY MEDICATION ADHERENCE     Medication Adherence    Patient reported X missed doses in the last month: 0  Specialty Medication: HUMIRA(CF) PEN 40 mg/0.4 mL  Patient is on additional specialty medications: No  Patient is on more than two specialty medications: No  Any gaps in refill history greater than 2 weeks in the last 3 months: no  Demonstrates understanding of importance of adherence: yes  Informant: patient  Reliability of informant: reliable  Confirmed plan for next specialty medication refill: delivery by pharmacy  Refills needed for supportive medications: not needed              Were doses missed due to medication being on hold? No    HUMIRA(CF) PEN 40 mg/0.4 mL : 0 days of medicine on hand       REFERRAL TO PHARMACIST     Referral to the pharmacist: Not needed      [facility]     Shipping address confirmed in Epic.     Delivery Scheduled: Yes, Expected medication delivery date: 02/24/21.     Medication will be delivered via UPS to the prescription address in Epic WAM.    Yolonda Kida   [facility] Specialty Technician

## 2021-02-20 DIAGNOSIS — K50919 Crohn's disease, unspecified, with unspecified complications: Principal | ICD-10-CM

## 2021-02-23 MED FILL — HUMIRA PEN CITRATE FREE 40 MG/0.4 ML: SUBCUTANEOUS | 28 days supply | Qty: 2 | Fill #3

## 2021-03-15 NOTE — Unmapped (Signed)
[facility] Refill Coordination Note    Specialty Medication(s) to be Shipped:   Inflammatory Disorders: Humira    Other medication(s) to be shipped: No additional medications requested for fill at this time     Dwan Bolt, DOB: [birth year]  Phone: There are no phone numbers on file.      All above HIPAA information was verified with patient.     Was a Nurse, learning disability used for this call? No    Completed refill call assessment today to schedule patient's medication shipment from the [facility] [phone]).  All relevant notes have been reviewed.     Specialty medication(s) and dose(s) confirmed: Regimen is correct and unchanged.   Changes to medications: Ayinde reports stopping the following medications: Gabapentin, Zoloft  Changes to insurance: No  New side effects reported not previously addressed with a pharmacist or physician: None reported  Questions for the pharmacist: No    Confirmed patient received a Conservation officer, historic buildings and a Surveyor, mining with first shipment. The patient will receive a drug information handout for each medication shipped and additional FDA Medication Guides as required.       DISEASE/MEDICATION-SPECIFIC INFORMATION        For patients on injectable medications: Patient currently has 0 doses left.  Next injection is scheduled for 03/28/21.    SPECIALTY MEDICATION ADHERENCE     Medication Adherence    Patient reported X missed doses in the last month: 0  Specialty Medication: HUMIRA(CF) PEN 40 mg/0.4 mL  Patient is on additional specialty medications: No  Patient is on more than two specialty medications: No              Were doses missed due to medication being on hold? No    Humria (CF) 40mg / 0.11ml: 0 days of medicine on hand       REFERRAL TO PHARMACIST     Referral to the pharmacist: Not needed      [facility]     Shipping address confirmed in Epic.     Delivery Scheduled: Yes, Expected medication delivery date: 03/22/21.     Medication will be delivered via UPS to the prescription address in Epic WAM.    Nancy Nordmann [facility] Specialty Technician

## 2021-03-20 DIAGNOSIS — K50919 Crohn's disease, unspecified, with unspecified complications: Principal | ICD-10-CM

## 2021-03-21 MED FILL — HUMIRA PEN CITRATE FREE 40 MG/0.4 ML: SUBCUTANEOUS | 28 days supply | Qty: 2 | Fill #4

## 2021-04-05 ENCOUNTER — Ambulatory Visit: Admit: 2021-04-05 | Discharge: 2021-04-06 | Payer: MEDICARE

## 2021-04-05 DIAGNOSIS — L0591 Pilonidal cyst without abscess: Principal | ICD-10-CM

## 2021-04-05 DIAGNOSIS — K50919 Crohn's disease, unspecified, with unspecified complications: Principal | ICD-10-CM

## 2021-04-05 NOTE — Unmapped (Signed)
Your surgery has been scheduled for Monday April 24th 2023            [facility] will call you the day before surgery with the exact time to arrive on the day of surgery, medication and diet instructions. Their telephone # is [phone].      You do not need a bowel prep     You need a licensed driver to accompany you the day of surgery     On the day of surgery you will check in to the Registration Office at the [facility] lobby.    If you have any questions or concerns regarding your appointment today please call   Everlene Balls, RN for Dr. Marcella Dubs and Dr. Christiane Ha Stem at [phone] or [phone]. Fax: [phone]   Clinic# [phone]    MyChart messages: These messages are checked by the nurses during normal business hours 8:00 am-3:30 pm Monday-Friday every 24-48 hours and are for non-urgent, non-emergent concerns

## 2021-04-05 NOTE — Unmapped (Signed)
LOWER GASTROINTESTINAL SURGERY CLINIC NOTE    Patient Name: Dakota Townsend  Patient MRN: [id number]  Date of Service: 04/05/2021  Attending: Sharyn Lull, *    Reason for Visit   Pilonidal disease    Assessment   [name] is a 41 y.o. male with history of ileal Crohn's disease (s/p ileocolectomy 11/2009, 02/2011) and pilonidal cyts (status post multiple I&D's) who presents to clinic for recurrent symptoms from underlying pilonidal disease.  Patient reports improvement in symptoms over the past 2 months however does describe a pattern of abscess formation and draining at least 1-2 times per month.  On exam patient has evidence of a healing pilonidal cyst on his right gluteal cleft (just lateral to the midline).  There is no evidence of active drainage or infection.    Plan   ?? Consented for trephination of pilonidal disease  ?? Tentative date to OR 05/01/2021.    I saw and evaluated the patient, participating in the key portions of the service.  I reviewed the resident???s note.  I agree with the resident???s findings and plan.  Dakota Townsend is a 41 year old gentleman with Crohn's disease who presents for evaluation of pilonidal disease.  Examination in the prone jackknife position demonstrates significant amount of lower back hair.  The perianal skin appears normal.  Digital rectal examination demonstrates a small divot anteriorly but no perianal fistula.  Overlying the lower part of the sacrum is a small area of swelling which does not seem to be draining currently.  We discussed that this is likely a pilonidal cyst and does not have to do with his Crohn's disease.  I also discussed with him that surgical management is completely dependent on how symptomatic he is.  He states that the cyst is draining 1-2 times per month and bothersome.  He would like to consider surgical management.  We discussed several options for surgical management including trephination, excision and flap coverage.  Given his limited disease, I think he is a perfect candidate for trephination.  We discussed what this entails.  The risk discussed include but are not limited to recurrence, need for additional procedures and prolonged wound healing.  He understands his risk agrees to proceed with that.  Total time spent: 45 minutes (history taking, examination, counseling, coordination of care, documentation).    Orlin Hilding, MD  Colorectal Surgery    Subjective   HPI:  Dakota Townsend is a 41 y.o. male with history of ileal Crohn's disease (status post ileocolectomy on 11/10/2009, 03/01/2011) and pilonidal disease who presents for recurrent disease symptoms.  Patient was last seen in our clinic (Dr. Epifania Gore) in June 2016 for his pilonidal disease, and based on review of his EMR surgery was discussed at that time as an option to address his recurrent infected pilonidal cyst.  Patient reports that he has been incarcerated for the better part of 5 years and has not had GI follow-up for his Crohn's disease or his pilonidal disease and is trying to reestablish care.   Patient follows with Dr. Stevphen Rochester is currently well managed on Humira and mercaptopurine. He was seen by Dr. Michaell Cowing (10/10/2020) for his pilonidal disease but due to his prior involvement with our care group was referred back to [facility] for his continuity of care.  Today he reports improvement in his symptoms and states that he has not had recurrence in over 2 months.  He was last on antibiotics 6 months ago.  He states that when he does have  a recurrence he begins with a raised boil that is exquisitely painful over the course of a couple of days until it spontaneously drains.  He currently denies any rectal pain or drainage.  He does not report any recent fevers, chills, night sweats, shortness of breath, chest pain, abdominal pain, urinary dysfunction, lower extremity pain/inflammation/edema or new skin changes.      Review of Systems: A 12 system review of systems was negative except as noted in the HPI.    Allergies  Allergies   Allergen Reactions   ??? Acetaminophen Nausea And Vomiting   ??? Benadryl [Diphenhydramine Hcl] Anaphylaxis   ??? Sulfa (Sulfonamide Antibiotics) Hives   ??? Rozerem [Ramelteon] Other (See Comments)     Pt reports hallucinations, muscle contortions  & dry eyes   ??? Shellfish Containing Products Diarrhea and Nausea And Vomiting     Past Medical History  Past Medical History:   Diagnosis Date   ??? Crohn disease (CMS-HCC)    ??? Pneumonia      Past Surgical History   Past Surgical History:   Procedure Laterality Date   ??? ADENOIDECTOMY Bilateral    ??? COLON SURGERY     ??? eustachian Bilateral    ??? ostomy reversal     ??? PR COLONOSCOPY FLX DX W/COLLJ SPEC WHEN PFRMD N/A 02/20/2013    Procedure: COLONOSCOPY, FLEXIBLE, PROXIMAL TO SPLENIC FLEXURE; DIAGNOSTIC, W/WO COLLECTION SPECIMEN BY BRUSH OR WASH;  Surgeon: Theadore Nan, MD;  Location: GI PROC[facility];  Service: Gastroenterology   ??? TONSILLECTOMY       Medications  Current Outpatient Medications   Medication Sig Dispense Refill   ??? buprenorphine-naloxone (SUBOXONE) 8-2 mg sublingual film TAKE ONE FILM DAILY     ??? cyanocobalamin, vitamin B-12, (VITAMIN B-12) 1000 MCG tablet Take 1 tablet (1,000 mcg total) by mouth daily.     ??? divalproex ER (DEPAKOTE ER) 500 MG extended released 24 hr tablet TAKE 2 TABLETS BY MOUTH WITH SUPPER DAILY     ??? empty container Misc Use as directed 1 each 3   ??? ferrous sulfate 325 (65 FE) MG tablet Take 1 tablet (325 mg total) by mouth daily.     ??? folic acid (FOLVITE) 1 MG tablet Take 1 tablet (1 mg total) by mouth daily. 30 tablet 11   ??? HUMIRA PEN CITRATE FREE 40 MG/0.4 ML Inject the contents of 1 pen (40 mg total) under the skin every fourteen (14) days. 6 each 1   ??? HUMIRA PEN CITRATE FREE STARTER PACK FOR CROHN'S/UC/HS 3 X 80 MG/0.8 ML Inject the contents of 2 pens (160 mg) under the skin on day 1, then 1 pen (80 mg) on day 15. THEN 1 pen (40 mg) every 2 weeks 3 each 0   ??? lamoTRIgine (LAMICTAL) 100 MG tablet Take 2 tablets (200 mg total) by mouth daily.     ??? loperamide (IMODIUM) 2 mg capsule Take 1 capsule (2 mg total) by mouth continuous as needed for diarrhea.     ??? mercaptopurine (PURINETHOL) 50 mg tablet 75 mg po qday 45 tablet 2   ??? promethazine (PHENERGAN) 25 MG tablet 0.5-1 tab po q 8hrs prn nausea 90 tablet 5   ??? gabapentin (NEURONTIN) 600 MG tablet TAKE 1 TABLET BY MOUTH AT NIGHT     ??? OLANZapine (ZYPREXA) 5 MG tablet Take 1 tablet (5 mg total) by mouth.     ??? sertraline (ZOLOFT) 100 MG tablet Take 1 tablet (100 mg total) by  mouth daily.       No current facility-administered medications for this visit.     Family History  No family history on file.  Social History  Social History     Socioeconomic History   ??? Marital status: Single     Spouse name: None   ??? Number of children: None   ??? Years of education: None   ??? Highest education level: None   Tobacco Use   ??? Smoking status: Every Day     Years: 15.00     Types: Cigarettes     Start date: 06/11/1997   ??? Smokeless tobacco: Never   ??? Tobacco comments:     using nicotine patch and nicotrol inhaler.   Substance and Sexual Activity   ??? Alcohol use: No   ??? Drug use: No   Social History Narrative    Past Psychiatric History:     Previous therapy: no    Previous psychiatric treatment and medication trials: no    Previous psychiatric hospitalizations: no    Previous diagnoses: no    Previous suicide attempts:no    History of violence: no    Currently in treatment with no one.    Other Pertinent History: Financial, due to loss of insurance, inability to work     Objective     Vitals:    04/05/21 1007   BP: 117/75   Pulse: 69   Temp: 36.4 ??C (97.5 ??F)       Wt Readings from Last 3 Encounters:   04/05/21 69.5 kg (153 lb 3.5 oz)   02/09/21 69.2 kg (152 lb 9.6 oz)   09/08/20 69.3 kg (152 lb 12.8 oz)         Physical Exam:  General:   Well appearing, no acute distress  Cardiovascular:  Regular rate and rhythm  Pulmonary:   Symmetrical chest wall rise. NWOB on RA.  Abdominal:   Soft and compressible; ND, NT; status post multiple abdominal surgeries with well-healed incisions  Rectal:   On external examination patient found with excess hair about the lower back, buttocks and bilateral thighs; there is evidence of what appears to be a well-healed pilonidal cyst on the right gluteal cleft (just lateral to midline); DRE performed without any evidence of gross abnormalities  Extremities:   No e/o edema, cyanosis; OTW WWP  Skin:    No visible rashes or suspicious lesions. OTW warm and dry  Musculoskeletal:  no joint tenderness, deformity, swelling, or muscular tenderness noted. Full range of motion without pain  Neurologic:   AAOx3. No visible tremor. Grossly intact neurologically  Psychiatric:   Alert and oriented to person, place, and time. Judgement and insight are appropriate      DIAGNOSTIC STUDIES:   No new studies

## 2021-04-05 NOTE — Unmapped (Signed)
Surgery scheduled for Monday April 24th 2023. Consent obtained and witnessed

## 2021-04-13 NOTE — Unmapped (Signed)
[facility] Refill Coordination Note    Specialty Medication(s) to be Shipped:   Inflammatory Disorders: Humira    Other medication(s) to be shipped: No additional medications requested for fill at this time     Dakota Townsend, DOB: [birth year]  Phone: There are no phone numbers on file.      All above HIPAA information was verified with patient.     Was a Nurse, learning disability used for this call? No    Completed refill call assessment today to schedule patient's medication shipment from the [facility] [phone]).  All relevant notes have been reviewed.     Specialty medication(s) and dose(s) confirmed: Regimen is correct and unchanged.   Changes to medications: Lynwood reports no changes at this time.  Changes to insurance: No  New side effects reported not previously addressed with a pharmacist or physician: None reported  Questions for the pharmacist: No    Confirmed patient received a Conservation officer, historic buildings and a Surveyor, mining with first shipment. The patient will receive a drug information handout for each medication shipped and additional FDA Medication Guides as required.       DISEASE/MEDICATION-SPECIFIC INFORMATION        For patients on injectable medications: Patient currently has 0 doses left.  Next injection is scheduled for 04/26/21.    SPECIALTY MEDICATION ADHERENCE     Medication Adherence    Patient reported X missed doses in the last month: 0  Specialty Medication: HUMIRA(CF) PEN 40 mg/0.4 mL injection  Patient is on additional specialty medications: No              Were doses missed due to medication being on hold? No    Humira 40/0.4 mg/ml: 0 days of medicine on hand         REFERRAL TO PHARMACIST     Referral to the pharmacist: Not needed      [facility]     Shipping address confirmed in Epic.     Delivery Scheduled: Yes, Expected medication delivery date: 04/20/2021.     Medication will be delivered via UPS to the prescription address in Epic WAM.    Willette Pa [facility] Specialty Technician

## 2021-04-17 DIAGNOSIS — K50919 Crohn's disease, unspecified, with unspecified complications: Principal | ICD-10-CM

## 2021-04-17 MED ORDER — BISACODYL 5 MG TABLET,DELAYED RELEASE
ORAL_TABLET | Freq: Once | ORAL | 0 refills | 1.00000 days | Status: CP
Start: 2021-04-17 — End: 2021-04-17
  Filled 2021-04-26: qty 2, 1d supply, fill #0

## 2021-04-17 MED ORDER — SIMETHICONE 125 MG CHEWABLE TABLET
ORAL_TABLET | ORAL | 0 refills | 0.00000 days | Status: CP
Start: 2021-04-17 — End: 2021-04-17
  Filled 2021-04-26: qty 4, 2d supply, fill #0

## 2021-04-17 MED ORDER — POLYETHYLENE GLYCOL 3350 17 GRAM/DOSE ORAL POWDER
ORAL | 0 refills | 0.00000 days | Status: CP
Start: 2021-04-17 — End: 2021-04-17

## 2021-04-19 MED FILL — HUMIRA PEN CITRATE FREE 40 MG/0.4 ML: SUBCUTANEOUS | 28 days supply | Qty: 2 | Fill #5

## 2021-04-26 MED FILL — POLYETHYLENE GLYCOL 3350 17 GRAM/DOSE ORAL POWDER: 1 days supply | Qty: 238 | Fill #0

## 2021-04-26 MED FILL — POLYETHYLENE GLYCOL 3350 17 GRAM/DOSE ORAL POWDER: 1 days supply | Qty: 119 | Fill #0

## 2021-04-29 MED ORDER — MERCAPTOPURINE 50 MG TABLET
ORAL_TABLET | 2 refills | 0 days
Start: 2021-04-29 — End: ?

## 2021-05-01 ENCOUNTER — Encounter: Admit: 2021-05-01 | Discharge: 2021-05-01 | Payer: MEDICARE | Attending: Registered Nurse | Primary: Registered Nurse

## 2021-05-01 ENCOUNTER — Ambulatory Visit: Admit: 2021-05-01 | Discharge: 2021-05-01 | Payer: MEDICARE

## 2021-05-01 DIAGNOSIS — K5090 Crohn's disease, unspecified, without complications: Secondary | ICD-10-CM | POA: Diagnosis not present

## 2021-05-01 DIAGNOSIS — Z79891 Long term (current) use of opiate analgesic: Secondary | ICD-10-CM | POA: Diagnosis not present

## 2021-05-01 DIAGNOSIS — L905 Scar conditions and fibrosis of skin: Secondary | ICD-10-CM | POA: Diagnosis not present

## 2021-05-01 DIAGNOSIS — Z79899 Other long term (current) drug therapy: Secondary | ICD-10-CM | POA: Diagnosis not present

## 2021-05-01 DIAGNOSIS — K219 Gastro-esophageal reflux disease without esophagitis: Secondary | ICD-10-CM | POA: Diagnosis not present

## 2021-05-01 DIAGNOSIS — F17210 Nicotine dependence, cigarettes, uncomplicated: Secondary | ICD-10-CM | POA: Diagnosis not present

## 2021-05-01 DIAGNOSIS — L0591 Pilonidal cyst without abscess: Secondary | ICD-10-CM | POA: Diagnosis not present

## 2021-05-01 DIAGNOSIS — Z882 Allergy status to sulfonamides status: Secondary | ICD-10-CM | POA: Diagnosis not present

## 2021-05-01 MED ORDER — MERCAPTOPURINE 50 MG TABLET
ORAL_TABLET | ORAL | 1 refills | 0.00000 days | Status: CP
Start: 2021-05-01 — End: 2021-05-01

## 2021-05-01 MED ORDER — NICOTINE 14 MG/24 HR DAILY TRANSDERMAL PATCH
MEDICATED_PATCH | TRANSDERMAL | 0 refills | 14.00000 days | Status: CP
Start: 2021-05-01 — End: 2021-05-15
  Filled 2021-05-01: qty 14, 14d supply, fill #0

## 2021-05-01 MED ORDER — OXYCODONE 5 MG TABLET
ORAL_TABLET | Freq: Four times a day (QID) | ORAL | 0 refills | 2.00000 days | Status: CP | PRN
Start: 2021-05-01 — End: 2021-05-06
  Filled 2021-05-01: qty 5, 2d supply, fill #0

## 2021-05-01 MED ADMIN — ketamine (KETALAR) injection: INTRAVENOUS | @ 12:00:00 | Stop: 2021-05-01

## 2021-05-01 MED ADMIN — BUPivacaine liposome (PF) (EXPAREL) 266 mg, sodium chloride (NS) 0.9 % 1 mL infiltration injection: 266 mg | @ 12:00:00 | Stop: 2021-05-01

## 2021-05-01 MED ADMIN — propofoL (DIPRIVAN) injection: INTRAVENOUS | @ 12:00:00 | Stop: 2021-05-01

## 2021-05-01 MED ADMIN — lidocaine (XYLOCAINE) 20 mg/mL (2 %) injection: INTRAVENOUS | @ 12:00:00 | Stop: 2021-05-01

## 2021-05-01 MED ADMIN — ondansetron (ZOFRAN) injection: INTRAVENOUS | @ 12:00:00 | Stop: 2021-05-01

## 2021-05-01 MED ADMIN — ketorolac (TORADOL) injection 15 mg: 15 mg | INTRAVENOUS | @ 13:00:00 | Stop: 2021-05-01

## 2021-05-01 MED ADMIN — oxyCODONE (ROXICODONE) immediate release tablet 5 mg: 5 mg | ORAL | @ 13:00:00 | Stop: 2021-05-01

## 2021-05-01 MED ADMIN — succinylcholine (ANECTINE) injection: INTRAVENOUS | @ 12:00:00 | Stop: 2021-05-01

## 2021-05-01 MED ADMIN — midazolam (VERSED) injection: INTRAVENOUS | @ 11:00:00 | Stop: 2021-05-01

## 2021-05-01 MED ADMIN — fentaNYL (PF) (SUBLIMAZE) injection: INTRAVENOUS | @ 12:00:00 | Stop: 2021-05-01

## 2021-05-01 MED ADMIN — ROCuronium (ZEMURON) injection: INTRAVENOUS | @ 12:00:00 | Stop: 2021-05-01

## 2021-05-01 MED ADMIN — EXPAREL ADMINISTERED WITHIN 96 HRS - NO BUPIVACAINE FOR 96 HOURS AFTER EXPAREL: 1 | @ 12:00:00 | Stop: 2021-05-01

## 2021-05-01 MED ADMIN — dexamethasone (DECADRON) 4 mg/mL injection: INTRAVENOUS | @ 12:00:00 | Stop: 2021-05-01

## 2021-05-01 MED ADMIN — lactated Ringers infusion: 10 mL/h | INTRAVENOUS | @ 11:00:00 | Stop: 2021-05-01

## 2021-05-01 NOTE — Unmapped (Signed)
Discussed with patient's stepfather via phone regarding ordering of oxycodone as needed for pain, stepfather shared with me that patient had a previous addition to oxycodone. Informed stepfather this nurse will clarify with patient the plan for discharge pain medication  Informed patient they had received exparrel for pain control which will help with pain for the following 96 hours. 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 Informed patient's stepfather of the plan regarding the issuing of oxycodone, This was verbally accepted.

## 2021-05-01 NOTE — Unmapped (Signed)
Addendum  created 05/01/21 1316 by Linward Headland, CRNA    Flowsheet accepted

## 2021-05-01 NOTE — Unmapped (Signed)
Refill request for mercaptopurine sent to pharamacy for one month supply. Labs DOS 02/09/21 show slightly low hemoglobin and hematocrit. Pt will be due for labs again in early May. Mychart message sent to pt regarding labs.

## 2021-05-01 NOTE — Unmapped (Signed)
Operative Note  (CSN: [id number])      Date of Surgery: 05/01/2021    Pre-op Diagnosis: pilonidal cyst    Post-op Diagnosis: same       [Note: Revisions to procedures should be made in chart - see Procedures tab.]    ANORECTAL EXAM, SURGICAL, REQUIRING ANESTHESIA (GENERAL, SPINAL, OR EPIDURAL), DIAGNOSTIC  EXCISION OF PILONIDAL CYST OR SINUS; SIMPLE    Performing Service: Gastrointestinal  Surgeon(s) and Role:     * Sharyn Lull, MD - Primary     * Despina Pole, MD - Resident - Assisting    Assistant: None    Anesthesia: General    Estimated Blood Loss: Minimal    Complications: None    Specimens: None collected    Findings: Pilonidal cyst in the midline at the top of the gluteal cleft, with 1 additional pit    Procedure: Examination under anesthesia, trephination of pilonidal cyst    Operative detail:  The patient was taken to the operating room and general anesthesia was induced.  He was repositioned prone jackknife with all pressure points padded.  The buttocks were retracted laterally with tape.  The lower back hair was clipped.  The area was prepped and draped in the usual sterile fashion.  A timeout procedure was performed.    The lower back and top of gluteal cleft was inspected.  There was a scar from the previous incision and drainage procedures.  There was also an additional pit adjacent to that area.  Both areas were trephinated.  The cavity had a hair in it and this was removed and the cavity was curetted.  It was irrigated with hydrogen peroxide.  Hemostasis was obtained.  Dressings were applied.  The patient was returned supine and extubated.  He was taken to recovery in good condition.  All counts were correct at the completion of the procedure.    Surgeon Notes: I was present and scrubbed for the entire procedure    Orlin Hilding   Date: 05/01/2021  Time: 8:45 AM

## 2021-05-04 DIAGNOSIS — K50919 Crohn's disease, unspecified, with unspecified complications: Secondary | ICD-10-CM | POA: Diagnosis not present

## 2021-05-05 LAB — CBC
HEMATOCRIT: 41.2 % (ref 37.5–51.0)
HEMOGLOBIN: 13.6 g/dL (ref 13.0–17.7)
MEAN CORPUSCULAR HEMOGLOBIN CONC: 33 g/dL (ref 31.5–35.7)
MEAN CORPUSCULAR HEMOGLOBIN: 30.2 pg (ref 26.6–33.0)
MEAN CORPUSCULAR VOLUME: 91 fL (ref 79–97)
PLATELET COUNT: 381 10*3/uL (ref 150–450)
RED BLOOD CELL COUNT: 4.51 x10E6/uL (ref 4.14–5.80)
RED CELL DISTRIBUTION WIDTH: 13.6 % (ref 11.6–15.4)
WHITE BLOOD CELL COUNT: 11.4 10*3/uL — ABNORMAL HIGH (ref 3.4–10.8)

## 2021-05-05 LAB — C-REACTIVE PROTEIN: C-REACTIVE PROTEIN: 2 mg/L (ref 0–10)

## 2021-05-05 LAB — ALT: ALT (SGPT): 10 IU/L (ref 0–44)

## 2021-05-09 NOTE — Unmapped (Signed)
Telephone call from patient asking if he can apply iodine or hydrocortisone cream. Advised to not apply either. Continue sitz baths and call if any issues. See picture below

## 2021-05-11 DIAGNOSIS — K50919 Crohn's disease, unspecified, with unspecified complications: Principal | ICD-10-CM

## 2021-05-11 MED ORDER — HUMIRA PEN CITRATE FREE 40 MG/0.4 ML
SUBCUTANEOUS | 1 refills | 84.00000 days | Status: CN
Start: 2021-05-11 — End: ?

## 2021-05-11 NOTE — Unmapped (Signed)
[facility] Clinical Assessment & Refill Coordination Note    Dakota Townsend stated he has a dental procedure scheduled for 5/30. I informed him that , per Dr. Kerry Kass recommendation, he should hold Humira dose for a week post procedure. Therefore next Humira doses will be on 5/16 and 6/6. He verbalized understanding and updated his calendar while on the phone.     Dakota Townsend, DOB: Apr 11, 1980  Phone: There are no phone numbers on file.    All above HIPAA information was verified with patient.     Was a Nurse, learning disability used for this call? No    Specialty Medication(s):   Inflammatory Disorders: Humira     Current Outpatient Medications   Medication Sig Dispense Refill   ??? buprenorphine-naloxone (SUBOXONE) 8-2 mg sublingual film TAKE ONE FILM DAILY     ??? cyanocobalamin, vitamin B-12, (VITAMIN B-12) 1000 MCG tablet Take 1 tablet (1,000 mcg total) by mouth daily.     ??? divalproex ER (DEPAKOTE ER) 500 MG extended released 24 hr tablet TAKE 2 TABLETS BY MOUTH WITH SUPPER DAILY     ??? empty container Misc Use as directed 1 each 3   ??? ferrous sulfate 325 (65 FE) MG tablet Take 1 tablet (325 mg total) by mouth daily.     ??? folic acid (FOLVITE) 1 MG tablet Take 1 tablet (1 mg total) by mouth daily. 30 tablet 11   ??? HUMIRA PEN CITRATE FREE 40 MG/0.4 ML Inject the contents of 1 pen (40 mg total) under the skin every fourteen (14) days. 6 each 1   ??? HUMIRA PEN CITRATE FREE STARTER PACK FOR CROHN'S/UC/HS 3 X 80 MG/0.8 ML Inject the contents of 2 pens (160 mg) under the skin on day 1, then 1 pen (80 mg) on day 15. THEN 1 pen (40 mg) every 2 weeks 3 each 0   ??? lamoTRIgine (LAMICTAL) 100 MG tablet Take 2 tablets (200 mg total) by mouth daily.     ??? loperamide (IMODIUM) 2 mg capsule Take 1 capsule (2 mg total) by mouth continuous as needed for diarrhea.     ??? mercaptopurine (PURINETHOL) 50 mg tablet TAKE 1.5 TABLETS (75MG ) BY MOUTH EVERY DAY 45 tablet 1   ??? nicotine (NICODERM CQ) 14 mg/24 hr patch Place 1 patch on the skin daily for 7 days. 14 patch 0   ??? polyethylene glycol (CLEARLAX) 17 gram/dose powder Take as directed for split prep. 119 g 0   ??? polyethylene glycol (CLEARLAX) 17 gram/dose powder Take as directed for split prep. 238 g 0   ??? promethazine (PHENERGAN) 25 MG tablet 0.5-1 tab po q 8hrs prn nausea 90 tablet 5   ??? simethicone (MYLICON) 125 MG chewable tablet Take as directed per [facility] prep sheet 4 tablet 0     No current facility-administered medications for this visit.        Changes to medications: Dakota Townsend no changes at this time.    Allergies   Allergen Reactions   ??? Acetaminophen Nausea And Vomiting   ??? Benadryl [Diphenhydramine Hcl] Anaphylaxis   ??? Sulfa (Sulfonamide Antibiotics) Hives   ??? Rozerem [Ramelteon] Other (See Comments)     Pt Townsend hallucinations, muscle contortions  & dry eyes   ??? Shellfish Containing Products Diarrhea and Nausea And Vomiting       Changes to allergies: No    SPECIALTY MEDICATION ADHERENCE     Humira 40 mg/0.30mL: 0 days of medicine on hand  Medication Adherence    Patient reported X missed doses in the last month: 0  Specialty Medication: Humira 40 mg/0.40mL - every 14 days  Patient is on additional specialty medications: No  Informant: patient          Specialty medication(s) dose(s) confirmed: Regimen is correct and unchanged.     Are there any concerns with adherence? No    Adherence counseling provided? Not needed    CLINICAL MANAGEMENT AND INTERVENTION      Clinical Benefit Assessment:    Do you feel the medicine is effective or helping your condition? Yes    Clinical Benefit counseling provided? Not needed    Adverse Effects Assessment:    Are you experiencing any side effects? No    Are you experiencing difficulty administering your medicine? No    Quality of Life Assessment:    Quality of Life    Rheumatology  Oncology  Dermatology  Cystic Fibrosis          How many days over the past month did your crohn's disease  keep you from your normal activities? For example, brushing your teeth or getting up in the morning. Dakota Townsend states he has some pain but has a colonoscopy on 5/10 for further assessment    Have you discussed this with your provider? Yes    Acute Infection Status:    Acute infections noted within Epic:  No active infections  Patient reported infection: None    Therapy Appropriateness:    Is therapy appropriate and patient progressing towards therapeutic goals? Yes, therapy is appropriate and should be continued    DISEASE/MEDICATION-SPECIFIC INFORMATION      For patients on injectable medications: Patient currently has 0 doses left.  Next injection is scheduled for 5/16.    PATIENT SPECIFIC NEEDS     - Does the patient have any physical, cognitive, or cultural barriers? No    - Is the patient high risk? No    - Does the patient require a Care Management Plan? No     SOCIAL DETERMINANTS OF HEALTH     At the [facility], we have learned that life circumstances - like trouble affording food, housing, utilities, or transportation can affect the health of many of our patients. That is why we wanted to ask: are you currently experiencing any life circumstances that are negatively impacting your health and/or quality of life? No    Social Determinants of Psychologist, prison and probation services Strain: Not on file   Internet Connectivity: Not on file   Food Insecurity: Not on file   Tobacco Use: High Risk   ??? Smoking Tobacco Use: Every Day   ??? Smokeless Tobacco Use: Never   ??? Passive Exposure: Not on file   Housing/Utilities: Not on file   Alcohol Use: Not on file   Transportation Needs: Not on file   Substance Use: Not on file   Health Literacy: Not on file   Physical Activity: Not on file   Interpersonal Safety: Not on file   Stress: Not on file   Intimate Partner Violence: Not on file   Depression: Not on file   Social Connections: Not on file       Would you be willing to receive help with any of the needs that you have identified today? Not applicable       SHIPPING     Specialty Medication(s) to be Shipped:   Inflammatory Disorders: Humira    Other  medication(s) to be shipped: No additional medications requested for fill at this time     Changes to insurance: No    Delivery Scheduled: Yes, Expected medication delivery date: 05/18/21.  However, Rx request for refills was sent to the provider as there are none remaining.     Medication will be delivered via UPS to the confirmed prescription address in [facility].    The patient will receive a drug information handout for each medication shipped and additional FDA Medication Guides as required.  Verified that patient has previously received a Conservation officer, historic buildings and a Surveyor, mining.    The patient or caregiver noted above participated in the development of this care plan and knows that they can request review of or adjustments to the care plan at any time.      All of the patient's questions and concerns have been addressed.    Dakota Townsend   [facility] Specialty Pharmacist

## 2021-05-12 MED ORDER — HUMIRA PEN CITRATE FREE 40 MG/0.4 ML
SUBCUTANEOUS | 0 refills | 84 days | Status: CP
Start: 2021-05-12 — End: ?
  Filled 2021-05-17: qty 2, 28d supply, fill #0

## 2021-05-15 DIAGNOSIS — K50919 Crohn's disease, unspecified, with unspecified complications: Principal | ICD-10-CM

## 2021-05-17 NOTE — Unmapped (Signed)
Patient due at GI Procedures at [facility] for a colonoscopy with start time of 0900 am. Arrival time of 0800 am. I called at 0900 am because he hasen't arrived. He says prep never started working and he spoke to on call GI Fellow around 0230 am  who told him he should cancel and reschedule and do an extended prep. I ask if he still needed to reschedule he said he did. I transferred him to schedulers and updated charge nurse.

## 2021-05-17 NOTE — Unmapped (Signed)
Patient contacted GI fellow on call at 2:30am. He has consumed 2/3 of the Miralax prep (8.3oz) and has not yet had a bowel movement. He is due to consume the other 4oz Miralax shortly, but is scheduled for colonoscopy at 8am and concerned he will not be prepped in time. I validated his concerns. He denies abdominal pain or vomiting. He does not have any extra Miralax with him at home and does not have a car or someone to get some for him at this hour. We discussed drinking the Miralax he has and attempting the 8am procedure, given indication of Crohn's disease, but he is concerned he will still need to reschedule anyways. With shared decision-making, we elected to reschedule his procedure for a later date with a more extended prep. I will inform Dr. Stevphen Rochester and the GI schedulers.

## 2021-05-30 ENCOUNTER — Ambulatory Visit: Admit: 2021-05-30 | Discharge: 2021-05-31 | Payer: MEDICARE

## 2021-05-30 DIAGNOSIS — Z09 Encounter for follow-up examination after completed treatment for conditions other than malignant neoplasm: Secondary | ICD-10-CM | POA: Diagnosis not present

## 2021-05-30 DIAGNOSIS — L0591 Pilonidal cyst without abscess: Secondary | ICD-10-CM | POA: Diagnosis not present

## 2021-06-04 NOTE — Unmapped (Signed)
Postoperative follow-up visit    Dakota Townsend is a 41 year old gentleman who presents with a pilonidal cyst treated with trephination on 05/01/2021.  He returns for follow-up.    BP 116/79 (BP Site: L Arm, BP Position: Sitting)  - Pulse 71  - Temp 36.3 ??C (97.3 ??F) (Temporal)  - Ht 180.3 cm (5' 11)  - Wt 68.9 kg (151 lb 12.8 oz)  - BMI 21.17 kg/m??    General: Comfortable and in no acute distress  Lower back: 2 areas that were previously trephinated are still open, treated with silver nitrate; no evidence of infection    -Overall doing well Mr Dakota Townsend is doing well  -Return to clinic in 1 month for reevaluation    Orlin Hilding, MD   Colorectal Surgery

## 2021-06-08 NOTE — Unmapped (Signed)
[facility] Refill Coordination Note    Specialty Medication(s) to be Shipped:   Inflammatory Disorders: Humira    Other medication(s) to be shipped: No additional medications requested for fill at this time     Dakota Townsend, DOB: March 22, 1980  Phone: There are no phone numbers on file.      All above HIPAA information was verified with patient.     Was a Nurse, learning disability used for this call? No    Completed refill call assessment today to schedule patient's medication shipment from the [facility] [phone]).  All relevant notes have been reviewed.     Specialty medication(s) and dose(s) confirmed: Regimen is correct and unchanged.   Changes to medications: Axiel reports no changes at this time.  Changes to insurance: No  New side effects reported not previously addressed with a pharmacist or physician: None reported  Questions for the pharmacist: No    Confirmed patient received a Conservation officer, historic buildings and a Surveyor, mining with first shipment. The patient will receive a drug information handout for each medication shipped and additional FDA Medication Guides as required.       DISEASE/MEDICATION-SPECIFIC INFORMATION        For patients on injectable medications: Patient currently has 1 doses left.  Next injection is scheduled for 06/21/21.    SPECIALTY MEDICATION ADHERENCE     Medication Adherence    Patient reported X missed doses in the last month: 0  Specialty Medication: HUMIRA(CF) PEN 40 mg/0.4 mL injection  Patient is on additional specialty medications: No              Were doses missed due to medication being on hold? No    Humira 40/0.4 mg/ml: 7 days of medicine on hand        REFERRAL TO PHARMACIST     Referral to the pharmacist: Not needed      [facility]     Shipping address confirmed in Epic.     Delivery Scheduled: Yes, Expected medication delivery date: 06/16/21.     Medication will be delivered via UPS to the prescription address in Epic WAM.    Willette Pa [facility] Specialty Technician

## 2021-06-12 DIAGNOSIS — K50919 Crohn's disease, unspecified, with unspecified complications: Principal | ICD-10-CM

## 2021-06-15 MED FILL — HUMIRA PEN CITRATE FREE 40 MG/0.4 ML: SUBCUTANEOUS | 28 days supply | Qty: 2 | Fill #1

## 2021-06-19 MED ORDER — BISACODYL 5 MG TABLET,DELAYED RELEASE
ORAL_TABLET | ORAL | 0 refills | 0 days | Status: CP
Start: 2021-06-19 — End: ?
  Filled 2021-06-27: qty 2, 1d supply, fill #0

## 2021-06-19 MED ORDER — POLYETHYLENE GLYCOL 3350 17 GRAM/DOSE ORAL POWDER
0 refills | 0 days | Status: CP
Start: 2021-06-19 — End: ?

## 2021-06-19 MED ORDER — PEG 3350-ELECTROLYTES 236 GRAM-22.74 GRAM-6.74 GRAM-5.86 GRAM SOLUTION
Freq: Once | ORAL | 0 refills | 1 days | Status: CP
Start: 2021-06-19 — End: 2021-06-19

## 2021-06-27 MED FILL — PEG 3350-ELECTROLYTES 236 GRAM-22.74 GRAM-6.74 GRAM-5.86 GRAM SOLUTION: ORAL | 1 days supply | Qty: 4000 | Fill #0

## 2021-06-27 MED FILL — POLYETHYLENE GLYCOL 3350 17 GRAM/DOSE ORAL POWDER: ORAL | 1 days supply | Qty: 238 | Fill #0

## 2021-07-04 ENCOUNTER — Ambulatory Visit: Admit: 2021-07-04 | Discharge: 2021-07-05 | Payer: MEDICARE

## 2021-07-04 DIAGNOSIS — L0591 Pilonidal cyst without abscess: Secondary | ICD-10-CM | POA: Diagnosis not present

## 2021-07-07 NOTE — Unmapped (Signed)
I spoke with this patient regarding his next shipment of Humira and he declined the refill. Dakota Townsend states that he had to hold off on his injections until after a procedure that he was having and he has another injection on hand. The patient asked that we give him a call on 07/17/21, I will reschedule the refill call for that time.

## 2021-07-10 DIAGNOSIS — K50919 Crohn's disease, unspecified, with unspecified complications: Principal | ICD-10-CM

## 2021-07-18 ENCOUNTER — Ambulatory Visit: Admit: 2021-07-18 | Discharge: 2021-07-18 | Payer: MEDICARE

## 2021-07-18 DIAGNOSIS — K50912 Crohn's disease, unspecified, with intestinal obstruction: Principal | ICD-10-CM

## 2021-07-18 DIAGNOSIS — K5000 Crohn's disease of small intestine without complications: Secondary | ICD-10-CM | POA: Diagnosis not present

## 2021-07-18 DIAGNOSIS — K50812 Crohn's disease of both small and large intestine with intestinal obstruction: Secondary | ICD-10-CM | POA: Diagnosis not present

## 2021-07-18 MED ORDER — PROMETHAZINE 25 MG TABLET
ORAL_TABLET | 2 refills | 0 days | Status: CP
Start: 2021-07-18 — End: ?

## 2021-07-18 MED ORDER — MERCAPTOPURINE 50 MG TABLET
ORAL_TABLET | 0 refills | 0 days | Status: CP
Start: 2021-07-18 — End: ?

## 2021-07-18 MED ADMIN — propofoL (DIPRIVAN) injection: INTRAVENOUS | @ 17:00:00 | Stop: 2021-07-18

## 2021-07-18 MED ADMIN — sodium chloride (NS) 0.9 % infusion: 10 mL/h | INTRAVENOUS | @ 16:00:00 | Stop: 2021-07-18

## 2021-07-18 MED ADMIN — ePHEDrine (PF) 25 mg/5 mL (5 mg/mL) in 0.9% sodium chloride syringe Syrg: INTRAVENOUS | @ 17:00:00 | Stop: 2021-07-18

## 2021-07-18 MED ADMIN — lidocaine (XYLOCAINE) 20 mg/mL (2 %) injection: INTRAVENOUS | @ 17:00:00 | Stop: 2021-07-18

## 2021-07-18 NOTE — Unmapped (Signed)
[facility] Refill Coordination Note    Specialty Medication(s) to be Shipped:   Inflammatory Disorders: Humira    Other medication(s) to be shipped: No additional medications requested for fill at this time     Dakota Townsend, DOB: 1980-02-01  Phone: There are no phone numbers on file.      All above HIPAA information was verified with patient.     Was a Nurse, learning disability used for this call? No    Completed refill call assessment today to schedule patient's medication shipment from the [facility] [phone]).  All relevant notes have been reviewed.     Specialty medication(s) and dose(s) confirmed: Regimen is correct and unchanged.   Changes to medications: Lazarius reports no changes at this time.  Changes to insurance: No  New side effects reported not previously addressed with a pharmacist or physician: None reported  Questions for the pharmacist: No    Confirmed patient received a Conservation officer, historic buildings and a Surveyor, mining with first shipment. The patient will receive a drug information handout for each medication shipped and additional FDA Medication Guides as required.       DISEASE/MEDICATION-SPECIFIC INFORMATION        For patients on injectable medications: Patient currently has 1 doses left.  Next injection is scheduled for 07/18/2021.    SPECIALTY MEDICATION ADHERENCE     Medication Adherence    Patient reported X missed doses in the last month: 0  Specialty Medication: HUMIRA(CF) PEN 40 mg/0.4 mL injection  Patient is on additional specialty medications: No  Any gaps in refill history greater than 2 weeks in the last 3 months: no  Demonstrates understanding of importance of adherence: yes  Informant: patient  Reliability of informant: reliable  Confirmed plan for next specialty medication refill: delivery by pharmacy  Refills needed for supportive medications: not needed              Were doses missed due to medication being on hold? No    Humira 40/0.4 mg/ml: 14 days of medicine on hand        REFERRAL TO PHARMACIST     Referral to the pharmacist: Not needed      [facility]     Shipping address confirmed in Epic.     Delivery Scheduled: Yes, Expected medication delivery date: 07/26/2021.     Medication will be delivered via UPS to the prescription address in Epic WAM.    Makendra Vigeant D Shandiin Eisenbeis   [facility] Specialty Technician

## 2021-07-19 NOTE — Unmapped (Signed)
labs

## 2021-07-23 ENCOUNTER — Inpatient Hospital Stay
Admit: 2021-07-23 | Discharge: 2021-07-28 | Disposition: A | Discharge status: Other Acute Inpatient Hospital | Payer: MEDICARE | Admitting: Internal Medicine

## 2021-07-23 ENCOUNTER — Emergency Department: Admit: 2021-07-23 | Payer: MEDICARE

## 2021-07-23 ENCOUNTER — Emergency Department: Payer: MEDICARE

## 2021-07-23 DIAGNOSIS — K5090 Crohn's disease, unspecified, without complications: Secondary | ICD-10-CM | POA: Diagnosis not present

## 2021-07-23 DIAGNOSIS — N19 Unspecified kidney failure: Secondary | ICD-10-CM | POA: Diagnosis not present

## 2021-07-23 DIAGNOSIS — E875 Hyperkalemia: Secondary | ICD-10-CM | POA: Diagnosis not present

## 2021-07-23 DIAGNOSIS — R1010 Upper abdominal pain, unspecified: Secondary | ICD-10-CM | POA: Diagnosis not present

## 2021-07-23 DIAGNOSIS — R091 Pleurisy: Secondary | ICD-10-CM | POA: Diagnosis not present

## 2021-07-23 DIAGNOSIS — J9601 Acute respiratory failure with hypoxia: Secondary | ICD-10-CM | POA: Diagnosis not present

## 2021-07-23 DIAGNOSIS — A409 Streptococcal sepsis, unspecified: Secondary | ICD-10-CM | POA: Diagnosis not present

## 2021-07-23 DIAGNOSIS — E8809 Other disorders of plasma-protein metabolism, not elsewhere classified: Secondary | ICD-10-CM | POA: Diagnosis not present

## 2021-07-23 DIAGNOSIS — R846 Abnormal cytological findings in specimens from respiratory organs and thorax: Secondary | ICD-10-CM | POA: Diagnosis not present

## 2021-07-23 DIAGNOSIS — R6520 Severe sepsis without septic shock: Secondary | ICD-10-CM | POA: Diagnosis not present

## 2021-07-23 DIAGNOSIS — J45909 Unspecified asthma, uncomplicated: Secondary | ICD-10-CM | POA: Diagnosis not present

## 2021-07-23 DIAGNOSIS — A499 Bacterial infection, unspecified: Secondary | ICD-10-CM | POA: Diagnosis not present

## 2021-07-23 DIAGNOSIS — Z882 Allergy status to sulfonamides status: Secondary | ICD-10-CM | POA: Diagnosis not present

## 2021-07-23 DIAGNOSIS — Z9981 Dependence on supplemental oxygen: Secondary | ICD-10-CM | POA: Diagnosis not present

## 2021-07-23 DIAGNOSIS — J9811 Atelectasis: Secondary | ICD-10-CM | POA: Diagnosis not present

## 2021-07-23 DIAGNOSIS — J90 Pleural effusion, not elsewhere classified: Secondary | ICD-10-CM | POA: Diagnosis not present

## 2021-07-23 DIAGNOSIS — Z7960 Long term (current) use of unspecified immunomodulators and immunosuppressants: Secondary | ICD-10-CM | POA: Diagnosis not present

## 2021-07-23 DIAGNOSIS — D649 Anemia, unspecified: Secondary | ICD-10-CM | POA: Diagnosis not present

## 2021-07-23 DIAGNOSIS — J9621 Acute and chronic respiratory failure with hypoxia: Secondary | ICD-10-CM | POA: Diagnosis not present

## 2021-07-23 DIAGNOSIS — E872 Acidosis, unspecified: Secondary | ICD-10-CM | POA: Diagnosis not present

## 2021-07-23 DIAGNOSIS — A419 Sepsis, unspecified organism: Secondary | ICD-10-CM | POA: Diagnosis not present

## 2021-07-23 DIAGNOSIS — N289 Disorder of kidney and ureter, unspecified: Secondary | ICD-10-CM | POA: Diagnosis not present

## 2021-07-23 DIAGNOSIS — E43 Unspecified severe protein-calorie malnutrition: Secondary | ICD-10-CM | POA: Diagnosis not present

## 2021-07-23 DIAGNOSIS — J918 Pleural effusion in other conditions classified elsewhere: Secondary | ICD-10-CM | POA: Diagnosis not present

## 2021-07-23 DIAGNOSIS — Z79899 Other long term (current) drug therapy: Secondary | ICD-10-CM | POA: Diagnosis not present

## 2021-07-23 DIAGNOSIS — D849 Immunodeficiency, unspecified: Secondary | ICD-10-CM | POA: Diagnosis not present

## 2021-07-23 DIAGNOSIS — D84821 Immunodeficiency due to drugs: Secondary | ICD-10-CM | POA: Diagnosis not present

## 2021-07-23 DIAGNOSIS — J869 Pyothorax without fistula: Secondary | ICD-10-CM | POA: Diagnosis not present

## 2021-07-23 DIAGNOSIS — J9600 Acute respiratory failure, unspecified whether with hypoxia or hypercapnia: Secondary | ICD-10-CM | POA: Diagnosis not present

## 2021-07-23 DIAGNOSIS — Z6821 Body mass index (BMI) 21.0-21.9, adult: Secondary | ICD-10-CM | POA: Diagnosis not present

## 2021-07-23 DIAGNOSIS — F17290 Nicotine dependence, other tobacco product, uncomplicated: Secondary | ICD-10-CM | POA: Diagnosis not present

## 2021-07-23 DIAGNOSIS — N179 Acute kidney failure, unspecified: Secondary | ICD-10-CM | POA: Diagnosis not present

## 2021-07-23 DIAGNOSIS — F17210 Nicotine dependence, cigarettes, uncomplicated: Secondary | ICD-10-CM | POA: Diagnosis not present

## 2021-07-23 DIAGNOSIS — R079 Chest pain, unspecified: Secondary | ICD-10-CM | POA: Diagnosis not present

## 2021-07-23 DIAGNOSIS — A4189 Other specified sepsis: Secondary | ICD-10-CM | POA: Diagnosis not present

## 2021-07-23 LAB — CBC WITH AUTO DIFFERENTIAL
Absolute Immature Granulocyte: 0.4 10*3/uL
Basophils %: 0 % (ref 0–1)
Basophils Absolute: 0 10*3/uL (ref 0.0–0.1)
Eosinophils %: 0 % (ref 0–7)
Eosinophils Absolute: 0 10*3/uL (ref 0.0–0.4)
Hematocrit: 26.7 % — ABNORMAL LOW (ref 36.6–50.3)
Hemoglobin: 9.5 g/dL — ABNORMAL LOW (ref 12.1–17.0)
Immature Granulocytes: 1 %
Lymphocytes %: 3 % — ABNORMAL LOW (ref 12–49)
Lymphocytes Absolute: 1.1 10*3/uL (ref 0.8–3.5)
MCH: 30.5 PG (ref 26.0–34.0)
MCHC: 35.6 g/dL (ref 30.0–36.5)
MCV: 85.9 FL (ref 80.0–99.0)
MPV: 11.2 FL (ref 8.9–12.9)
Monocytes %: 8 % (ref 5–13)
Monocytes Absolute: 2.9 10*3/uL — ABNORMAL HIGH (ref 0.0–1.0)
Neutrophils %: 88 % — ABNORMAL HIGH (ref 32–75)
Neutrophils Absolute: 31.6 10*3/uL — ABNORMAL HIGH (ref 1.8–8.0)
Platelets: 554 10*3/uL — ABNORMAL HIGH (ref 150–400)
RBC: 3.11 M/uL — ABNORMAL LOW (ref 4.10–5.70)
RDW: 14.6 % — ABNORMAL HIGH (ref 11.5–14.5)
WBC: 36 10*3/uL — ABNORMAL HIGH (ref 4.1–11.1)

## 2021-07-23 LAB — COMPREHENSIVE METABOLIC PANEL
ALT: 45 U/L (ref 12–78)
AST: 74 U/L — ABNORMAL HIGH (ref 15–37)
Albumin/Globulin Ratio: 0.4 — ABNORMAL LOW (ref 1.1–2.2)
Albumin: 1.9 g/dL — ABNORMAL LOW (ref 3.5–5.0)
Alk Phosphatase: 191 U/L — ABNORMAL HIGH (ref 45–117)
Anion Gap: 18 mmol/L — ABNORMAL HIGH (ref 5–15)
BUN: 53 mg/dL — ABNORMAL HIGH (ref 6–20)
Bun/Cre Ratio: 23 — ABNORMAL HIGH (ref 12–20)
CO2: 18 mmol/L — ABNORMAL LOW (ref 21–32)
Calcium: 9 mg/dL (ref 8.5–10.1)
Chloride: 102 mmol/L (ref 97–108)
Creatinine: 2.32 mg/dL — ABNORMAL HIGH (ref 0.70–1.30)
Est, Glom Filt Rate: 36 mL/min/{1.73_m2} — ABNORMAL LOW (ref >60)
Globulin: 4.9 g/dL — ABNORMAL HIGH (ref 2.0–4.0)
Glucose: 80 mg/dL (ref 65–100)
Potassium: 3.7 mmol/L (ref 3.5–5.1)
Sodium: 138 mmol/L (ref 136–145)
Total Bilirubin: 0.5 mg/dL (ref 0.2–1.0)
Total Protein: 6.8 g/dL (ref 6.4–8.2)

## 2021-07-23 LAB — TROPONIN: Troponin, High Sensitivity: <4 ng/L (ref 0–76)

## 2021-07-23 LAB — VENOUS BLOOD GAS, POINT OF CARE
Base Deficit, Venous: 9.6 mmol/L
HCO3, Venous: 15 mmol/L — ABNORMAL LOW (ref 22.0–26.0)
PCO2, Venus, POC: 27.8 mmHg — ABNORMAL LOW (ref 41.0–52.0)
PH, VENOUS (POC): 7.34 (ref 7.23–7.44)
PO2, VENOUS (POC): 53 mmHg — ABNORMAL HIGH (ref 25–40)
SO2, VENOUS (POC): 85.4 %

## 2021-07-23 LAB — POC LACTIC ACID: POC Lactic Acid: 0.68 mmol/L (ref 0.40–2.00)

## 2021-07-23 LAB — LIPASE: Lipase: 29 U/L — ABNORMAL LOW (ref 73–393)

## 2021-07-23 MED ORDER — NORMAL SALINE FLUSH 0.9 % IV SOLN
0.9 % | INTRAVENOUS | Status: AC | PRN
Start: 2021-07-23 — End: 2021-07-27

## 2021-07-23 MED ORDER — PIPERACILLIN SOD-TAZOBACTAM SO 4.5 (4-0.5) G IV SOLR
4.5 | Freq: Once | INTRAVENOUS | Status: AC
Start: 2021-07-23 — End: 2021-07-24
  Administered 2021-07-24: 06:00:00 4500 mg via INTRAVENOUS

## 2021-07-23 MED ORDER — VANCOMYCIN HCL 1 G IV SOLR
1 | Freq: Once | INTRAVENOUS | Status: AC
Start: 2021-07-23 — End: 2021-07-23
  Administered 2021-07-24: 01:00:00 1750 mg via INTRAVENOUS

## 2021-07-23 MED ORDER — ONDANSETRON 4 MG PO TBDP
4 MG | Freq: Three times a day (TID) | ORAL | Status: AC | PRN
Start: 2021-07-23 — End: 2021-07-27

## 2021-07-23 MED ORDER — NORMAL SALINE FLUSH 0.9 % IV SOLN
0.9 % | Freq: Two times a day (BID) | INTRAVENOUS | Status: AC
Start: 2021-07-23 — End: 2021-07-27
  Administered 2021-07-24 – 2021-07-25 (×2): 10 mL via INTRAVENOUS
  Administered 2021-07-25: 14:00:00 5 mL via INTRAVENOUS
  Administered 2021-07-26 – 2021-07-27 (×4): 10 mL via INTRAVENOUS

## 2021-07-23 MED ORDER — LAMOTRIGINE 100 MG PO TABS
100 MG | Freq: Every day | ORAL | Status: AC
Start: 2021-07-23 — End: 2021-07-27
  Administered 2021-07-24 – 2021-07-27 (×5): 200 mg via ORAL

## 2021-07-23 MED ORDER — MAGNESIUM SULFATE 2000 MG/50 ML IVPB PREMIX
2 GM/50ML | INTRAVENOUS | Status: AC | PRN
Start: 2021-07-23 — End: 2021-07-27

## 2021-07-23 MED ORDER — SODIUM CHLORIDE 0.45 % IV SOLN
0.45 | INTRAVENOUS | Status: DC
Start: 2021-07-23 — End: 2021-07-25
  Administered 2021-07-24: 01:00:00 via INTRAVENOUS

## 2021-07-23 MED ORDER — SODIUM CHLORIDE 0.9 % IV BOLUS
0.9 % | INTRAVENOUS | Status: DC
Start: 2021-07-23 — End: 2021-07-23

## 2021-07-23 MED ORDER — POTASSIUM CHLORIDE 10 MEQ/100ML IV SOLN
10 MEQ/0ML | INTRAVENOUS | Status: AC | PRN
Start: 2021-07-23 — End: 2021-07-27

## 2021-07-23 MED ORDER — VANCOMYCIN INTERMITTENT DOSING (PLACEHOLDER)
INTRAVENOUS | Status: DC
Start: 2021-07-23 — End: 2021-07-26

## 2021-07-23 MED ORDER — SODIUM CHLORIDE 0.9 % IV SOLN (MINI-BAG)
0.9 | Freq: Three times a day (TID) | INTRAVENOUS | Status: DC
Start: 2021-07-23 — End: 2021-07-23

## 2021-07-23 MED ORDER — ACETAMINOPHEN 650 MG RE SUPP
650 | Freq: Four times a day (QID) | RECTAL | Status: DC | PRN
Start: 2021-07-23 — End: 2021-07-25

## 2021-07-23 MED ORDER — ONDANSETRON HCL 4 MG/2ML IJ SOLN
4 MG/2ML | Freq: Four times a day (QID) | INTRAMUSCULAR | Status: AC | PRN
Start: 2021-07-23 — End: 2021-07-27
  Administered 2021-07-24 – 2021-07-27 (×4): 4 mg via INTRAVENOUS

## 2021-07-23 MED ORDER — AZITHROMYCIN 500 MG IV SOLR
500 MG | INTRAVENOUS | Status: AC
Start: 2021-07-23 — End: 2021-07-23
  Administered 2021-07-23: 20:00:00 500 mg via INTRAVENOUS

## 2021-07-23 MED ORDER — SODIUM CHLORIDE 0.9% BOLUS (FLUID RESUSCITATION)
0.9 % | Freq: Once | INTRAVENOUS | Status: AC
Start: 2021-07-23 — End: 2021-07-23
  Administered 2021-07-23: 20:00:00 2040 mL/kg via INTRAVENOUS

## 2021-07-23 MED ORDER — ENOXAPARIN SODIUM 40 MG/0.4ML IJ SOSY
40 MG/0.4ML | Freq: Every day | INTRAMUSCULAR | Status: AC
Start: 2021-07-23 — End: 2021-07-27
  Administered 2021-07-24 – 2021-07-27 (×4): 40 mg via SUBCUTANEOUS

## 2021-07-23 MED ORDER — SODIUM CHLORIDE 0.9 % IV SOLN
0.9 % | INTRAVENOUS | Status: AC | PRN
Start: 2021-07-23 — End: 2021-07-27

## 2021-07-23 MED ORDER — FUROSEMIDE 10 MG/ML IJ SOLN
10 | Freq: Once | INTRAMUSCULAR | Status: DC
Start: 2021-07-23 — End: 2021-07-27

## 2021-07-23 MED ORDER — ACETAMINOPHEN 325 MG PO TABS
325 | Freq: Four times a day (QID) | ORAL | Status: DC | PRN
Start: 2021-07-23 — End: 2021-07-25
  Administered 2021-07-24 – 2021-07-25 (×7): 650 mg via ORAL

## 2021-07-23 MED ORDER — STERILE WATER FOR INJECTION (MIXTURES ONLY)
1 g | INTRAMUSCULAR | Status: AC
Start: 2021-07-23 — End: 2021-07-23
  Administered 2021-07-23: 20:00:00 1000 mg via INTRAVENOUS

## 2021-07-23 MED ORDER — ALBUMIN HUMAN 25 % IV SOLN
25 % | Freq: Once | INTRAVENOUS | Status: AC
Start: 2021-07-23 — End: 2021-07-24
  Administered 2021-07-24: 05:00:00 25 g via INTRAVENOUS

## 2021-07-23 MED ORDER — POLYETHYLENE GLYCOL 3350 17 G PO PACK
17 g | Freq: Every day | ORAL | Status: AC | PRN
Start: 2021-07-23 — End: 2021-07-27

## 2021-07-23 MED ORDER — SODIUM CHLORIDE 0.9 % IV SOLN (MINI-BAG)
0.9 % | Freq: Three times a day (TID) | INTRAVENOUS | Status: AC
Start: 2021-07-23 — End: 2021-07-31
  Administered 2021-07-24 – 2021-07-27 (×10): 3375 mg via INTRAVENOUS

## 2021-07-23 MED FILL — SODIUM BICARBONATE 8.4 % IV SOLN: 8.4 % | INTRAVENOUS | Qty: 50

## 2021-07-23 MED FILL — SODIUM CHLORIDE FLUSH 0.9 % IV SOLN: 0.9 % | INTRAVENOUS | Qty: 40

## 2021-07-23 MED FILL — SODIUM CHLORIDE 0.9 % IV SOLN: 0.9 % | INTRAVENOUS | Qty: 3000

## 2021-07-23 MED FILL — SODIUM CHLORIDE 0.9 % IV SOLN: 0.9 % | INTRAVENOUS | Qty: 1000

## 2021-07-23 MED FILL — VANCOMYCIN HCL 1 G IV SOLR: 1 g | INTRAVENOUS | Qty: 1750

## 2021-07-23 NOTE — Assessment & Plan Note (Signed)
-   Chronicity unclear w/o evidence of active bleeding present  - Follow up Iron/B12 and hemolytic studies ordered  - Monitor Hgb and transfuse for Hgb<7

## 2021-07-23 NOTE — Assessment & Plan Note (Signed)
-   Progressive DOE described w/ hypoxia present on arrival and  severe RLL infiltrate with associated large loculated right pleural effusion noted on CT Chest  - Empiric Vanco/Zosyn initiated for HAP coverage given chronic immunosuppressant tx and IVDU  - Urgent thoracentesis anticipated with Pan CX and MRSA Nasal swab ordered  - Additional ATBX adjustment pending pleural fluid evaluation  - Pulm eval recommended

## 2021-07-23 NOTE — ED Provider Notes (Addendum)
EMERGENCY DEPARTMENT HISTORY AND PHYSICAL EXAM      Date: 07/23/2021  Patient Name: Dylan Mccormick    History of Presenting Illness     Chief Complaint   Patient presents with    Shortness of Breath    Flank Pain    Abdominal Pain    Cough       History Provided By:     HPI: Dylan Mccormick, is a very pleasant 41 y.o. male presenting to the ED with a chief complaint of shortness of breath, cough, flank pain, abdominal pain.  For the last several days he has been experiencing right flank pain that is achy and radiates to his mid abdomen.  Also having cough and shortness of breath.  Symptoms are progressively getting worse.  States he has been drinking a lot of water but continues to feel dehydrated.  He denies any chest pains.  States he injects fentanyl daily but has not used for 2 weeks.    Denies any other symptoms at this time.    PCP: No primary care provider on file.    No current facility-administered medications on file prior to encounter.     Current Outpatient Medications on File Prior to Encounter   Medication Sig Dispense Refill    lamoTRIgine (LAMICTAL) 100 MG tablet Take 1 tablet by mouth daily      adalimumab (HUMIRA) 40 MG/0.8ML injection Inject 0.8 mLs into the skin once a week         Past History     Past Medical History:  Past Medical History:   Diagnosis Date    Crohn's disease ([place])     IV drug abuse ([place])        Past Surgical History:  History reviewed. No pertinent surgical history.    Family History:  History reviewed. No pertinent family history.    Social History:  Social History     Tobacco Use    Smoking status: Some Days     Types: Cigarettes    Smokeless tobacco: Never   Substance Use Topics    Alcohol use: Not Currently    Drug use: Not Currently     Types: Other-see comments     Comment: fentanyl - last use 3 weeks ago       Allergies:  Allergies   Allergen Reactions    Sulfa Antibiotics Rash         Review of Systems     Negative unless otherwise stated in HPI    Physical Exam      Physical Exam  Constitutional:       Appearance: Normal appearance. He is ill-appearing.   HENT:      Head: Normocephalic and atraumatic.      Right Ear: External ear normal.      Left Ear: External ear normal.      Nose: Nose normal.   Eyes:      Extraocular Movements: Extraocular movements intact.      Conjunctiva/sclera: Conjunctivae normal.   Cardiovascular:      Rate and Rhythm: Normal rate and regular rhythm.   Pulmonary:      Effort: Tachypnea present.      Breath sounds: Examination of the right-upper field reveals rhonchi. Examination of the right-middle field reveals rhonchi. Examination of the right-lower field reveals rhonchi. Rhonchi present.   Abdominal:      Tenderness: There is no abdominal tenderness. There is no right CVA tenderness, left CVA tenderness, guarding or rebound.  Comments: Generalized abdominal tenderness, no rebound or guarding   Musculoskeletal:         General: No swelling or deformity.      Cervical back: Normal range of motion. No rigidity.   Skin:     Findings: No lesion or rash.   Neurological:      General: No focal deficit present.      Mental Status: He is alert and oriented to person, place, and time.   Psychiatric:         Mood and Affect: Mood normal.         Behavior: Behavior normal.       Lab and Diagnostic Study Results     Labs -     Recent Results (from the past 12 hour(s))   CBC with Auto Differential    Collection Time: 07/23/21  3:23 PM   Result Value Ref Range    WBC 36.0 (H) 4.1 - 11.1 K/uL    RBC 3.11 (L) 4.10 - 5.70 M/uL    Hemoglobin 9.5 (L) 12.1 - 17.0 g/dL    Hematocrit 26.7 (L) 36.6 - 50.3 %    MCV 85.9 80.0 - 99.0 FL    MCH 30.5 26.0 - 34.0 PG    MCHC 35.6 30.0 - 36.5 g/dL    RDW 14.6 (H) 11.5 - 14.5 %    Platelets 554 (H) 150 - 400 K/uL    MPV 11.2 8.9 - 12.9 FL    Neutrophils % 88 (H) 32 - 75 %    Lymphocytes % 3 (L) 12 - 49 %    Monocytes % 8 5 - 13 %    Eosinophils % 0 0 - 7 %    Basophils % 0 0 - 1 %    Immature Granulocytes 1 %     Neutrophils Absolute 31.6 (H) 1.8 - 8.0 K/UL    Lymphocytes Absolute 1.1 0.8 - 3.5 K/UL    Monocytes Absolute 2.9 (H) 0.0 - 1.0 K/UL    Eosinophils Absolute 0.0 0.0 - 0.4 K/UL    Basophils Absolute 0.0 0.0 - 0.1 K/UL    Absolute Immature Granulocyte 0.4 K/UL    Differential Type AUTOMATED      RBC Comment Normocytic, Normochromic     CMP    Collection Time: 07/23/21  3:23 PM   Result Value Ref Range    Sodium 138 136 - 145 mmol/L    Potassium 3.7 3.5 - 5.1 mmol/L    Chloride 102 97 - 108 mmol/L    CO2 18 (L) 21 - 32 mmol/L    Anion Gap 18 (H) 5 - 15 mmol/L    Glucose 80 65 - 100 mg/dL    BUN 53 (H) 6 - 20 mg/dL    Creatinine 2.32 (H) 0.70 - 1.30 mg/dL    Bun/Cre Ratio 23 (H) 12 - 20      Est, Glom Filt Rate 36 (L) >60 ml/min/1.48m    Calcium 9.0 8.5 - 10.1 mg/dL    Total Bilirubin 0.5 0.2 - 1.0 mg/dL    AST 74 (H) 15 - 37 U/L    ALT 45 12 - 78 U/L    Alk Phosphatase 191 (H) 45 - 117 U/L    Total Protein 6.8 6.4 - 8.2 g/dL    Albumin 1.9 (L) 3.5 - 5.0 g/dL    Globulin 4.9 (H) 2.0 - 4.0 g/dL    Albumin/Globulin Ratio 0.4 (L) 1.1 - 2.2     Lipase  Collection Time: 07/23/21  3:23 PM   Result Value Ref Range    Lipase 29 (L) 73 - 393 U/L   Troponin    Collection Time: 07/23/21  3:23 PM   Result Value Ref Range    Troponin, High Sensitivity <4 0 - 76 ng/L   POC Lactic Acid    Collection Time: 07/23/21  3:50 PM   Result Value Ref Range    POC Lactic Acid 0.68 0.40 - 2.00 mmol/L    Performed by: Lenn Sink    Venous Blood Gas, POC    Collection Time: 07/23/21  5:53 PM   Result Value Ref Range    PH, VENOUS (POC) 7.34 7.23 - 7.44      PCO2, Venus, POC 27.8 (L) 41.0 - 52.0 mmHg    PO2, VENOUS (POC) 53 (H) 25 - 40 mmHg    HCO3, Venous 15.0 (L) 22.0 - 26.0 mmol/L    SO2, VENOUS (POC) 85.4 %    Base Deficit, Venous 9.6 mmol/L    Specimen type: Venous      Performed by: Lenn Sink        Radiology:  Interpretation per the Radiologist below, if available at the time of this note:  XR CHEST PORTABLE   Final  Result      Large right pleural effusion with associated airspace disease, not better   characterized.      CT CHEST ABDOMEN PELVIS WO CONTRAST Additional Contrast? None    (Results Pending)          Medical Decision Making   - I am the primary provider for this patient Staci Acosta, DO  - I reviewed the vital signs, available nursing notes, past medical history, past surgical history, family history and social history.  - Initial assessment performed. The patients presenting problems have been discussed, and they are in agreement with the care plan formulated and outlined with them.  I have encouraged them to ask questions as they arise throughout their visit.    Vital Signs-Reviewed the patient's vital signs.  Vitals:    07/23/21 1632 07/23/21 1730 07/23/21 1753 07/23/21 1757   BP: 104/71 107/80  114/70   Pulse: 96 100  (!) 104   Resp: 28 (!) 36 28 27   Temp: 98.1 F (36.7 C) 98.1 F (36.7 C)     TempSrc: Oral      SpO2: 100% 98%  98%   Weight:       Height:           CONSULTS: (Who and What was discussed)  None      Chronic Conditions: Reviewed per above     Social Determinants affecting Dx or Tx: None     Records Reviewed (source and summary of external notes): Nursing notes    ED Course as of 07/23/21 Malachi Paradise Jul 23, 2021   1748 Dr. Ermalene Postin accepts admission.  Discussed case with pulmonologist on-call, Dr.Mughal.  Rozetta Nunnery with BiPAP and ICU.  Discussed CT chest not back as there are delays in final read.  Recommends transferring patient to [facility].  Will follow-up on imaging once resulted.  Will decide upon timing for thoracentesis upon arrival. [JB]      ED Course User Index  [JB] Mirian Mo, DO     EKG interpreted by myself normal sinus rhythm, heart rate 100, no STEMI    ED Course/ Provider Notes (Medical Decision Making):     Patient presented to  the emergency department with the aforementioned chief complaint.  On examination the patient is ill-appearing.  Vitals were reviewed  per above.  He is noted to be tachycardic, tachypneic with suspected source of infection being right-sided pneumonia.  Sepsis protocol initiated.  Blood cultures and lactic acid ordered.  He was given 30 cc/kg NS bolus.  Started on Rocephin and azithromycin.  CBC notable for significant leukocytosis.  Serum creatinine with suspected acute kidney injury.  Baseline unclear at this time.  Chest x-ray shows Large right pleural effusion with associated airspace disease, not better characterized.  Given large effusion and abdominal pain, CT chest abdomen pelvis ordered which is still pending at time of dictation.  Delays in read.  Unable to reach radiologist.  Case discussed with pulmonologist, Dr. Orvan Seen who has no further antibiotic recommendations.  Agrees with BiPAP and admission to ICU for evaluation for  thoracentesis.  Case also discussed with telemetry hospitalist Dr. Ermalene Postin who accepted admission.  Prior to transfer, discussed case again with Dr. Orvan Seen that CT abdomen pelvis still has not resulted.  Recommends not delaying transfer, will follow up on CT once in ICU.  Lastly Case discussed with hospitalist, Dr. Jeanelle Malling who is aware patient will be going to ICU soon.  Will also be following up on pending CT that still has not resulted.     Sepsis Reassessment:    The patient meets criteria for Severe Sepsis due to a suspected source being Pneumonia/Respiratory  Cultures and antibiotics were initiated sequentially and appropriately as per orders.   Fluid resuscitation volume with 70m/kg crystalloid bolus was: GIVEN: the patient HAS severe sepsis/septic shock OR had a hypotensive blood pressure reading at any point. The patient therefore received the full standard 396mkg bolus based on actual body weight.  I have performed a sepsis reassessment of the patient's clinical volume status and tissue perfusion after the final volume of target fluid was initiated, at 1740, and the patient is adequately  resuscitated.        Procedures   Medical Decision Makingedical Decision Making  Performed by: JoStaci AcostaDO  Procedures      CRITICAL CARE NOTE :    6:38 PM    IMPENDING DETERIORATION -Metabolic, respiratory  ASSOCIATED RISK FACTORS - Hypotension, Shock, and Hypoxia  MANAGEMENT- Bedside Assessment  INTERPRETATION -  Xrays, CT Scan, ECG, and Blood Pressure  INTERVENTIONS - hemodynamic mgmt, respiratory mgmt, and Metabolic interventions  CASE REVIEW - Hospitalist/Intensivist and Nursing  TREATMENT RESPONSE -Stable  PERFORMED BY - Self    NOTES   :  I have spent 70 minutes of critical care time involved in lab review, consultations with specialist, family decision- making, bedside attention and documentation. This time excludes time spent in any separate billed procedures.  During this entire length of time I was immediately available to the patient .    JoStaci AcostaDO        Disposition   Disposition:   Admission  Diagnosis     Clinical Impression:    1. Sepsis with acute respiratory failure without septic shock, due to unspecified organism, unspecified whether hypoxia or hypercapnia present (HCC)    2. Large pleural effusion    3. Pneumonia of right lung due to infectious organism, unspecified part of lung    4. Renal insufficiency        Attestations:    JoStaci AcostaDO    Please note that this dictation was completed with Dragon, the computer voice recognition software.  Quite often unanticipated grammatical, syntax, homophones, and other interpretive errors are inadvertently transcribed by the computer software.  Please disregard these errors.  Please excuse any errors that have escaped final proofreading.  Thank you.         Mirian Mo, DO  07/23/21 [place] Kyndell Zeiser, DO  08/28/21 1221

## 2021-07-23 NOTE — Assessment & Plan Note (Signed)
-   Progressive Wt loss, weakness and evidence hypoalbuminemia noted on admission  - Potential underlying liver disease or renal losses with follow up hepatic evaluation and Urine Protein/Cr ratio ordered  - Nutritional eval requested

## 2021-07-23 NOTE — ED Triage Notes (Addendum)
Pt has right flank and abdominal for 2 weeks.  Cough and shortness of breath.

## 2021-07-23 NOTE — H&P (Signed)
HISTORY AND PHYSICAL             Date: 07/23/2021        Patient Name: Dylan Mccormick     Date of Birth: 08-12-1980      Age:  41 y.o.    Chief Complaint     Chief Complaint   Patient presents with    Shortness of Breath    Flank Pain    Abdominal Pain    Cough      History Obtained From   patient    History of Present Illness     Pt is a 41 yr old male with a hx of crohns dz and IVDU who presented to the ED with a cc of progressive DOE and weakness.  Symptoms were progressively worse over the past several days and ultimately presented to the ED when he developed a pleuritic Rt flank pain that radiated into his abd.  PT was febrile and hypoxic on arrival but remained hemodynamically stable.    A severe RLL infiltrate and large secondary pleural effusion was noted on CT and acute renal failure present on initial lab work and radiographic studies. Concern for HAP and parapneumonia effusion was raised and empiric ATBX intiated.  Transfer for Pulm eval and thoracentesis arranged an pt in serious condition @ the time of transfer    Hospitalist Phone at [facility] called x 2 without pick up and not able to leave a message    Past Medical History     Past Medical History:   Diagnosis Date    Crohn's disease ([place])     IV drug abuse ([place])         Past Surgical History   History reviewed. No pertinent surgical history.     Medications Prior to Admission     Prior to Admission medications    Medication Sig Start Date End Date Taking? Authorizing Provider   lamoTRIgine (LAMICTAL) 100 MG tablet Take by mouth daily   Yes Historical Provider, MD   adalimumab (HUMIRA) 40 MG/0.8ML injection Inject 0.8 mLs into the skin every 14 days   Yes Historical Provider, MD   mercaptopurine (PURINETHOL) 50 MG chemo tablet Take 1.5 tablets by mouth every evening   Yes Historical Provider, MD   promethazine (PHENERGAN) 25 MG tablet Take 1 tablet by mouth every 6 hours as needed for Nausea   Yes Historical Provider, MD        Allergies    Sulfa antibiotics    Social History     Social History       Tobacco History       Smoking Status  Some Days Smoking Tobacco Type  Cigarettes      Smokeless Tobacco Use  Never              Alcohol History       Alcohol Use Status  Not Currently              Drug Use       Drug Use Status  Not Currently Types  Other-see comments Comment  fentanyl - last use 3 weeks ago              Sexual Activity       Sexually Active  Not Asked                    Family History   History reviewed. No pertinent family history.    Review  of Systems   The following systems were reviewed and pertinent positive and negative findings noted in the HPI    [x] Neuro   [x] Gastro  [x] Pulm   [x] Heme/Onc  [x] Cardio   [x] Derm     Physical Exam   BP 118/74   Pulse 100   Temp 98.1 F (36.7 C)   Resp 27   Ht 1.803 m (5' 11" )   Wt 68 kg (150 lb)   SpO2 98%   BMI 20.92 kg/m     Neuro: CN intact, MAE, strength intact, no tremors  Cardio:  RRR w/o SEM  Pulm: CTA  on left with crackles and diminished BS on Rt  Gastro: Soft, NT, ND BS+, No guarding  Extremity: No edema, muscle wasting    Labs      Recent Results (from the past 24 hour(s))   CBC with Auto Differential    Collection Time: 07/23/21  3:23 PM   Result Value Ref Range    WBC 36.0 (H) 4.1 - 11.1 K/uL    RBC 3.11 (L) 4.10 - 5.70 M/uL    Hemoglobin 9.5 (L) 12.1 - 17.0 g/dL    Hematocrit 26.7 (L) 36.6 - 50.3 %    MCV 85.9 80.0 - 99.0 FL    MCH 30.5 26.0 - 34.0 PG    MCHC 35.6 30.0 - 36.5 g/dL    RDW 14.6 (H) 11.5 - 14.5 %    Platelets 554 (H) 150 - 400 K/uL    MPV 11.2 8.9 - 12.9 FL    Neutrophils % 88 (H) 32 - 75 %    Lymphocytes % 3 (L) 12 - 49 %    Monocytes % 8 5 - 13 %    Eosinophils % 0 0 - 7 %    Basophils % 0 0 - 1 %    Immature Granulocytes 1 %    Neutrophils Absolute 31.6 (H) 1.8 - 8.0 K/UL    Lymphocytes Absolute 1.1 0.8 - 3.5 K/UL    Monocytes Absolute 2.9 (H) 0.0 - 1.0 K/UL    Eosinophils Absolute 0.0 0.0 - 0.4 K/UL    Basophils Absolute 0.0 0.0 - 0.1 K/UL    Absolute  Immature Granulocyte 0.4 K/UL    Differential Type AUTOMATED      RBC Comment Normocytic, Normochromic     CMP    Collection Time: 07/23/21  3:23 PM   Result Value Ref Range    Sodium 138 136 - 145 mmol/L    Potassium 3.7 3.5 - 5.1 mmol/L    Chloride 102 97 - 108 mmol/L    CO2 18 (L) 21 - 32 mmol/L    Anion Gap 18 (H) 5 - 15 mmol/L    Glucose 80 65 - 100 mg/dL    BUN 53 (H) 6 - 20 mg/dL    Creatinine 2.32 (H) 0.70 - 1.30 mg/dL    Bun/Cre Ratio 23 (H) 12 - 20      Est, Glom Filt Rate 36 (L) >60 ml/min/1.42m    Calcium 9.0 8.5 - 10.1 mg/dL    Total Bilirubin 0.5 0.2 - 1.0 mg/dL    AST 74 (H) 15 - 37 U/L    ALT 45 12 - 78 U/L    Alk Phosphatase 191 (H) 45 - 117 U/L    Total Protein 6.8 6.4 - 8.2 g/dL    Albumin 1.9 (L) 3.5 - 5.0 g/dL    Globulin 4.9 (H) 2.0 - 4.0 g/dL    Albumin/Globulin  Ratio 0.4 (L) 1.1 - 2.2     Lipase    Collection Time: 07/23/21  3:23 PM   Result Value Ref Range    Lipase 29 (L) 73 - 393 U/L   Troponin    Collection Time: 07/23/21  3:23 PM   Result Value Ref Range    Troponin, High Sensitivity <4 0 - 76 ng/L   POC Lactic Acid    Collection Time: 07/23/21  3:50 PM   Result Value Ref Range    POC Lactic Acid 0.68 0.40 - 2.00 mmol/L    Performed by: Lenn Sink    Venous Blood Gas, POC    Collection Time: 07/23/21  5:53 PM   Result Value Ref Range    PH, VENOUS (POC) 7.34 7.23 - 7.44      PCO2, Venus, POC 27.8 (L) 41.0 - 52.0 mmHg    PO2, VENOUS (POC) 53 (H) 25 - 40 mmHg    HCO3, Venous 15.0 (L) 22.0 - 26.0 mmol/L    SO2, VENOUS (POC) 85.4 %    Base Deficit, Venous 9.6 mmol/L    Specimen type: Venous      Performed by: Lenn Sink         Imaging/Diagnostics Last 24 Hours   XR CHEST PORTABLE    Result Date: 07/23/2021  EXAM:  XR CHEST PORTABLE INDICATION: Shortness of breath COMPARISON: none TECHNIQUE: portable chest AP view obtained portably at 1528 hours FINDINGS: The cardiac silhouette is within normal limits. The pulmonary vasculature is within normal limits. There is a large right  pleural effusion with associated airspace disease throughout the right lung. The visualized bones and upper abdomen are age-appropriate.     Large right pleural effusion with associated airspace disease, not better characterized.    CT CHEST ABDOMEN PELVIS WO CONTRAST Additional Contrast? None    Result Date: 07/23/2021  INDICATION: Upper abdominal pain, right flank pain COMPARISON: None TECHNIQUE: Noncontrast helical CT of the chest, abdomen, and pelvis. Oral contrast was not administered.  Coronal and sagittal reconstructions were generated. CT dose reduction was achieved through use of a standardized protocol tailored for this examination and automatic exposure control for dose modulation. Absence of intravenous contrast limits evaluation of the mediastinum, hila, vasculature, and solid organs. FINDINGS: THYROID: No nodule. MEDIASTINUM: Multiple prominent mediastinal lymph nodes along the right upper and lower paratracheal stations. HILA: No gross pathology. THORACIC AORTA: No dissection or aneurysm. MAIN PULMONARY ARTERY: Normal in caliber. TRACHEA/BRONCHI: Patent. ESOPHAGUS: No wall thickening or dilatation. HEART: Normal in size.  Cononary artery calcium:  Present PLEURA: Moderate to large loculated right pleural effusion. LUNGS: Heterogeneous right lower lobe airspace opacification and passive atelectasis of the right upper lobe. Mild nodular airspace disease in the posterior left lower lobe. LIVER: No mass or biliary dilatation. GALLBLADDER: Unremarkable. SPLEEN: No mass. PANCREAS: No gross pathology. ADRENALS: Unremarkable. KIDNEYS: No nephrolithiasis or hydronephrosis. STOMACH: Unremarkable. SMALL BOWEL: Dilated small bowel loops in the pelvis with evidence of delayed transit. Terminal ileal bowel loops are circumferentially thickened leading up to the neoterminal ileum. COLON: Postsurgical changes from right hemicolectomy. APPENDIX: Not seen PERITONEUM: No ascites or pneumoperitoneum. RETROPERITONEUM: No  lymphadenopathy or aortic aneurysm. REPRODUCTIVE ORGANS: Normal URINARY BLADDER: No mass or calculus. BONES: No destructive bone lesion. ADDITIONAL COMMENTS: N/A     1.  Severe right lower lobe pneumonia with large loculated right pleural effusion. 2.  Reactive mediastinal lymphadenopathy. 3.  Inflamed distal small bowel. Correlate for inflammatory bowel disease. 4.  No bowel obstruction.  Assessment      Hospital Problems             Last Modified POA    * (Principal) Sepsis ([place]) 07/23/2021 Yes    Renal failure 07/23/2021 Yes    Acute respiratory failure with hypoxia ([place]) 7/61/6073 Yes    Metabolic acidosis 07/18/6267 Yes    Normocytic anemia 07/23/2021 Yes    Severe protein-calorie malnutrition ([place]) 07/23/2021 Yes        Plan   Acute respiratory failure with hypoxia ([place])   - Progressive DOE described w/ hypoxia present on arrival and  severe RLL infiltrate with associated large loculated right pleural effusion noted on CT Chest  - Empiric Vanco/Zosyn initiated for HAP coverage given chronic immunosuppressant tx and IVDU  - Urgent thoracentesis anticipated with Pan CX and MRSA Nasal swab ordered  - Additional ATBX adjustment pending pleural fluid evaluation  - Pulm eval recommended    Renal failure  - Baseline GFR unclear w/ acute oliguric failure present on admission and evidence of rentetion (PVR>700cc)  - Foley cath placed and Urine lytes ordered  - Monitor renal fxn with gentle IVF support and diuresis with Albumin    * Sepsis ([place])   - SIRS criteria 3/4 with SOFA -  And potential underlying HAP suspected based on radiographic studies  - Vanco/Zosyn initiated for HAP coverage and given chronic immunosuppressant therapy  - Pan Cx ordered with additional Plueral fluid eval pending PUlm review  - Monitor BP and serial lactic acid levels    Severe protein-calorie malnutrition (HCC)  - Progressive Wt loss, weakness and evidence hypoalbuminemia noted on admission  - Potential underlying liver disease or renal  losses with follow up hepatic evaluation and Urine Protein/Cr ratio ordered  - Nutritional eval requested    Normocytic anemia  - Chronicity unclear w/o evidence of active bleeding present  - Follow up Iron/B12 and hemolytic studies ordered  - Monitor Hgb and transfuse for Hgb<7    Metabolic acidosis  - AG acidosis present w/o evidence of shock present and underlying Ketoacidosis suspected  - HCO3 support via MIV initiated and maintain MAP>60  - Urine lytes ordered    Consultations Ordered:  IP CONSULT TO PHARMACY  IP CONSULT TO PULMONOLOGY  IP CONSULT TO PULMONOLOGY  IP CONSULT TO SPIRITUAL SERVICES  IP CONSULT TO RESPIRATORY CARE    Electronically signed by Jena Gauss, DO on 07/23/21 at 9:00 PM EDT    is a 41 y.o. male evaluated via teladoc for Shortness of Breath, Flank Pain, Abdominal Pain, and Cough    .    as evaluated through a synchronous (real-time) video encounter. Patient identification was verified at the start of the visit. He (or guardian if applicable) is aware that this is a billable service, which includes applicable co-pays. This visit was conducted with the patient's (and/or legal guardian's) verbal consent. He has not had a related appointment within my department in the past 7 days or scheduled within the next 24 hours.   The patient was located at [place] provider was located at Office ([place]): .

## 2021-07-23 NOTE — Progress Notes (Signed)
Zosyn Extended-Infusion Dosing/Monitoring  Current regimen:  3.375 g every 6 hours (over 30 min)    Recent Labs     07/23/21  1523   BUN 53*     Estimated CrCl:  41 mL/min    Plan: Change to 3.375 g IV over 240 minutes every 8 hours per [facility] P&T Committee Protocol with respect to extended-infusion ?-lactam antibiotics. Pharmacy will continue to monitor patient daily and will make dosage adjustments based upon changing renal function.

## 2021-07-23 NOTE — Progress Notes (Signed)
Vancomycin Dosing Consult  Dylan Mccormick is a 41 y.o. male with PNA HAP and sepsis. Pharmacy was consulted by Dr. Leonidas Romberg to dose and monitor vancomycin. Today is day 1.    Antibiotic regimen: Vancomycin + Zosyn    Temp (24hrs), Avg:97.8 F (36.6 C), Min:97.3 F (36.3 C), Max:98.1 F (36.7 C)    Recent Labs     07/23/21  1523   WBC 36.0*   CREATININE 2.32*   BUN 53*     Est CrCl: 41 mL/min  Concomitant nephrotoxic drugs: None    Cultures:   7/16 Blood: pending    MRSA Swab: Not ordered, patient already received first dose of vancomycin    Target range: AUC/MIC 400-600    Recent level history:  Date/Time Dose & Interval Measured Level (mcg/mL) Associated AUC/MIC   7/16 1750mg  IV x 1          Assessment/Plan:   Patient presents with ARF, AKI, PNA and sepsis.  Marked leukocytosis, afebrile.  Will draw random level 7/17 at 17:00, awaiting AKI improvement.  Antimicrobial stop date TBD.

## 2021-07-23 NOTE — Assessment & Plan Note (Signed)
-   SIRS criteria 3/4 with SOFA -  And potential underlying HAP suspected based on radiographic studies  - Vanco/Zosyn initiated for HAP coverage and given chronic immunosuppressant therapy  - Pan Cx ordered with additional Plueral fluid eval pending PUlm review  - Monitor BP and serial lactic acid levels

## 2021-07-23 NOTE — Assessment & Plan Note (Addendum)
-   Baseline GFR unclear w/ acute oliguric failure present on admission and evidence of rentetion (PVR>700cc)  - Foley cath placed and Urine lytes ordered  - Monitor renal fxn with gentle IVF support and diuresis with Albumin

## 2021-07-23 NOTE — Assessment & Plan Note (Addendum)
-   AG acidosis present w/o evidence of shock present and underlying Ketoacidosis suspected  - HCO3 support via MIV initiated and maintain MAP>60  - Urine lytes ordered

## 2021-07-24 ENCOUNTER — Inpatient Hospital Stay: Admit: 2021-07-24 | Payer: MEDICARE

## 2021-07-24 ENCOUNTER — Inpatient Hospital Stay: Payer: MEDICARE

## 2021-07-24 LAB — CELL COUNT WITH DIFFERENTIAL, BODY FLUID
Bands, Fluid: 0 %
Eos, Fluid: 1 % — AB
Lymphocytes, Body Fluid: 17 % — AB
Monocyte Count, Fluid: 21 %
Polys, Fluid: 61 % — AB
RBC, Fluid: >100 /mm3 — ABNORMAL HIGH
Total Nucleated Cell Count: 67022 /mm3

## 2021-07-24 LAB — CBC WITH AUTO DIFFERENTIAL
Absolute Immature Granulocyte: 0 10*3/uL
Basophils %: 0 % (ref 0–1)
Basophils Absolute: 0 10*3/uL (ref 0.0–0.1)
Eosinophils %: 0 % (ref 0–7)
Eosinophils Absolute: 0 10*3/uL (ref 0.0–0.4)
Hematocrit: 24.5 % — ABNORMAL LOW (ref 36.6–50.3)
Hemoglobin: 8.7 g/dL — ABNORMAL LOW (ref 12.1–17.0)
Immature Granulocytes: 0 %
Lymphocytes %: 8 % — ABNORMAL LOW (ref 12–49)
Lymphocytes Absolute: 2.6 10*3/uL (ref 0.8–3.5)
MCH: 30.1 PG (ref 26.0–34.0)
MCHC: 35.5 g/dL (ref 30.0–36.5)
MCV: 84.8 FL (ref 80.0–99.0)
MPV: 10.1 FL (ref 8.9–12.9)
Monocytes %: 3 % — ABNORMAL LOW (ref 5–13)
Monocytes Absolute: 1 10*3/uL (ref 0.0–1.0)
Neutrophils %: 89 % — ABNORMAL HIGH (ref 32–75)
Neutrophils Absolute: 28.4 10*3/uL — ABNORMAL HIGH (ref 1.8–8.0)
Nucleated RBCs: 0 PER 100 WBC
Platelets: 645 10*3/uL — ABNORMAL HIGH (ref 150–400)
RBC: 2.89 M/uL — ABNORMAL LOW (ref 4.10–5.70)
RDW: 14.6 % — ABNORMAL HIGH (ref 11.5–14.5)
WBC: 32 10*3/uL — ABNORMAL HIGH (ref 4.1–11.1)
nRBC: 0 10*3/uL (ref 0.00–0.01)

## 2021-07-24 LAB — LACTATE DEHYDROGENASE, BODY FLUID: LD, Fluid: 3817 U/L

## 2021-07-24 LAB — COMPREHENSIVE METABOLIC PANEL W/ REFLEX TO MG FOR LOW K
ALT: 40 U/L (ref 12–78)
AST: 50 U/L — ABNORMAL HIGH (ref 15–37)
Albumin/Globulin Ratio: 0.5 — ABNORMAL LOW (ref 1.1–2.2)
Albumin: 1.8 g/dL — ABNORMAL LOW (ref 3.5–5.0)
Alk Phosphatase: 177 U/L — ABNORMAL HIGH (ref 45–117)
Anion Gap: 9 mmol/L (ref 5–15)
BUN: 35 mg/dL — ABNORMAL HIGH (ref 6–20)
Bun/Cre Ratio: 26 — ABNORMAL HIGH (ref 12–20)
CO2: 18 mmol/L — ABNORMAL LOW (ref 21–32)
Calcium: 8.4 mg/dL — ABNORMAL LOW (ref 8.5–10.1)
Chloride: 116 mmol/L — ABNORMAL HIGH (ref 97–108)
Creatinine: 1.36 mg/dL — ABNORMAL HIGH (ref 0.70–1.30)
Est, Glom Filt Rate: >60 mL/min/{1.73_m2} (ref >60)
Globulin: 4 g/dL (ref 2.0–4.0)
Glucose: 94 mg/dL (ref 65–100)
Potassium: 3 mmol/L — ABNORMAL LOW (ref 3.5–5.1)
Sodium: 143 mmol/L (ref 136–145)
Total Bilirubin: 0.8 mg/dL (ref 0.2–1.0)
Total Protein: 5.8 g/dL — ABNORMAL LOW (ref 6.4–8.2)

## 2021-07-24 LAB — HEPATITIS PANEL, ACUTE
Hep A IgM: NONREACTIVE
Hep B Core Ab, IgM: NONREACTIVE
Hepatitis B Surface Ag: <0.1 Index
Hepatitis C Ab: 0.1 Index
Interpretation: NEGATIVE
Interpretation: NONREACTIVE

## 2021-07-24 LAB — URINE DRUG SCREEN
Amphetamine, Urine: NEGATIVE
Barbiturates, Urine: NEGATIVE
Benzodiazepines, Urine: NEGATIVE
Cocaine, Urine: NEGATIVE
Methadone, Urine: NEGATIVE
Opiates, Urine: NEGATIVE
PCP, Urine: NEGATIVE
THC, TH-Cannabinol, Urine: NEGATIVE

## 2021-07-24 LAB — SEDIMENTATION RATE: Sed Rate, Automated: 73 mm/hr — ABNORMAL HIGH (ref 0–15)

## 2021-07-24 LAB — URINALYSIS WITH REFLEX TO CULTURE
BACTERIA, URINE: NEGATIVE /hpf
Bilirubin Urine: NEGATIVE
Glucose, UA: NEGATIVE mg/dL
Ketones, Urine: NEGATIVE mg/dL
Leukocyte Esterase, Urine: NEGATIVE
Nitrite, Urine: NEGATIVE
Protein, UA: NEGATIVE mg/dL
Specific Gravity, UA: 1.012 (ref 1.003–1.030)
Urobilinogen, Urine: 0.1 EU/dL (ref 0.1–1.0)
pH, Urine: 5 (ref 5.0–8.0)

## 2021-07-24 LAB — HAPTOGLOBIN: Haptoglobin: 528 mg/dL — ABNORMAL HIGH (ref 30–200)

## 2021-07-24 LAB — C-REACTIVE PROTEIN: CRP: 28.9 mg/dL — ABNORMAL HIGH (ref 0.00–0.60)

## 2021-07-24 LAB — PH, BODY FLUID: pH, Fluid: 8

## 2021-07-24 LAB — LACTATE DEHYDROGENASE
LD: 247 U/L — ABNORMAL HIGH (ref 85–241)
LD: 162 U/L (ref 85–241)

## 2021-07-24 LAB — LACTIC ACID: Lactic Acid, Plasma: 1.1 mmol/L (ref 0.4–2.0)

## 2021-07-24 LAB — TRIGLYCERIDES, BODY FLUIDS: Triglycerides, Fluid: 41 mg/dL

## 2021-07-24 LAB — PROCALCITONIN: Procalcitonin: 3.2 ng/mL — ABNORMAL HIGH

## 2021-07-24 LAB — PROTIME-INR
INR: 1.5 — ABNORMAL HIGH (ref 0.9–1.1)
Protime: 19.1 s — ABNORMAL HIGH (ref 11.9–14.6)

## 2021-07-24 LAB — MRSA BY PCR: MRSA by PCR: NOT DETECTED

## 2021-07-24 LAB — PROTEIN, BODY FLUID: Total Protein, Body Fluid: 4.7 g/dL

## 2021-07-24 LAB — PROTEIN / CREATININE RATIO, URINE
Creatinine, Ur: 52 mg/dL
PROTEIN/CREAT RATIO URINE RAN: 0.9
Protein, Urine, Random: 46 mg/dL — ABNORMAL HIGH (ref 0.0–11.9)

## 2021-07-24 LAB — IRON AND TIBC
Iron % Saturation: 13 % — ABNORMAL LOW (ref 20–50)
Iron: 21 ug/dL — ABNORMAL LOW (ref 35–150)
TIBC: 168 ug/dL — ABNORMAL LOW (ref 250–450)

## 2021-07-24 LAB — VANCOMYCIN LEVEL, RANDOM: Vancomycin Rm: 5.4 ug/mL

## 2021-07-24 LAB — VITAMIN B12: Vitamin B-12: >2000 pg/mL — ABNORMAL HIGH (ref 193–986)

## 2021-07-24 LAB — PHOSPHORUS: Phosphorus: 6.4 mg/dL — ABNORMAL HIGH (ref 2.6–4.7)

## 2021-07-24 MED ORDER — VANCOMYCIN INTERMITTENT DOSING (PLACEHOLDER)
Freq: Once | INTRAVENOUS | Status: DC
Start: 2021-07-24 — End: 2021-07-25

## 2021-07-24 MED ORDER — PROMETHAZINE HCL 6.25 MG/5ML PO SOLN
6.25 | ORAL | Status: DC | PRN
Start: 2021-07-24 — End: 2021-07-27

## 2021-07-24 MED ORDER — LIDOCAINE 4 % EX PTCH
4 | Freq: Every day | CUTANEOUS | Status: DC
Start: 2021-07-24 — End: 2021-07-27
  Administered 2021-07-24 – 2021-07-27 (×4): 1 via TRANSDERMAL

## 2021-07-24 MED ORDER — MELATONIN 5 MG PO TABS
5 MG | Freq: Every evening | ORAL | Status: AC | PRN
Start: 2021-07-24 — End: 2021-07-27
  Administered 2021-07-24 – 2021-07-27 (×4): 5 mg via ORAL

## 2021-07-24 MED ORDER — VIAL2BAG ADAPTOR (20 MM)
0.9 | Freq: Two times a day (BID) | INTRAVENOUS | Status: DC
Start: 2021-07-24 — End: 2021-07-26
  Administered 2021-07-25 – 2021-07-26 (×4): 1000 mg via INTRAVENOUS

## 2021-07-24 MED ORDER — LACTATED RINGERS IV SOLN
INTRAVENOUS | Status: AC
Start: 2021-07-24 — End: 2021-07-24
  Administered 2021-07-24 (×2): via INTRAVENOUS

## 2021-07-24 MED ORDER — VANCOMYCIN INTERMITTENT DOSING (PLACEHOLDER)
Freq: Once | INTRAVENOUS | Status: AC
Start: 2021-07-24 — End: 2021-07-24
  Administered 2021-07-24: 21:00:00 1

## 2021-07-24 MED FILL — VANCOMYCIN INTERMITTENT DOSING (PLACEHOLDER): INTRAVENOUS | Qty: 1

## 2021-07-24 MED FILL — PROMETHAZINE HCL 6.25 MG/5ML PO SYRP: 6.25 MG/5ML | ORAL | Qty: 5

## 2021-07-24 MED FILL — LACTATED RINGERS IV SOLN: INTRAVENOUS | Qty: 1000

## 2021-07-24 NOTE — Progress Notes (Signed)
Vancomycin Dosing Consult  Dylan Mccormick is a 41 y.o. male with PNA HAP and sepsis. Pharmacy was consulted by Dr. Leonidas Romberg to dose and monitor vancomycin. Today is day 1.    Antibiotic regimen: Vancomycin + Zosyn    Temp (24hrs), Avg:98 F (36.7 C), Min:97.2 F (36.2 C), Max:98.4 F (36.9 C)    Recent Labs     07/23/21  1523 07/24/21  0500   WBC 36.0* 32.0*   CREATININE 2.32* 1.36*   BUN 53* 35*     Est CrCl: ~70 mL/min  Concomitant nephrotoxic drugs: None    Cultures:   7/16 Blood: pending    MRSA Swab: Not ordered, patient already received first dose of vancomycin    Target range: AUC/MIC 400-600    Recent level history:  Date/Time Dose & Interval Measured Level (mcg/mL) Associated AUC/MIC   7/17@1700  1750mg  IV x 1  5.4         Assessment/Plan:   Afebrile, ARF, AKI improving, WBC trending down, CRP and Procal elevated  Started on AUC based regimen of Vanc 1000 mg q12hr from today with for an estimated AUC:MIC of 566.  Will draw random level 7/19 at 0500  Antimicrobial stop date TBD.

## 2021-07-24 NOTE — Progress Notes (Signed)
HOSPITALIST PROGRESS NOTE  Angelita Harnack Donneta Romberg MD, [place]         Daily Progress Note: 07/24/2021      Subjective:     Per Admission H&P - 07/23/21  41 yr old male with a hx of crohns dz and IVDU who presented to the ED with a cc of progressive DOE and weakness.  Symptoms were progressively worse over the past several days and ultimately presented to the ED when he developed a pleuritic Rt flank pain that radiated into his abd.  PT was febrile and hypoxic on arrival but remained hemodynamically stable.    A severe RLL infiltrate and large secondary pleural effusion was noted on CT and acute renal failure present on initial lab work and radiographic studies. Concern for HAP and parapneumonia effusion was raised and empiric ATBX intiated.  Transfer for Pulm eval and thoracentesis arranged an pt in serious condition @ the time of transfer.  =========================    07/24/2021  Continues to be alert oriented x4 and afebrile with low normal blood pressures and tachypnea, dependent on 2 L O2.  Continues to have right-sided pleuritic pressure and chest pain.    Counseled on findings of severe right lower lobe pneumonia along with large loculated pleural effusion.  Counseled on continued antibiotics as well as consultation per IR and pulmonology.      Medications reviewed  Current Facility-Administered Medications   Medication Dose Route Frequency    lactated ringers IV soln infusion   IntraVENous Continuous    promethazine (PHENERGAN) 6.25 MG/5ML syrup 6.25 mg  6.25 mg Oral Q4H PRN    sodium bicarbonate 50 mEq in sodium chloride 0.45 % 1,000 mL infusion   IntraVENous Continuous    lamoTRIgine (LAMICTAL) tablet 200 mg  200 mg Oral Daily    sodium chloride flush 0.9 % injection 5-40 mL  5-40 mL IntraVENous 2 times per day    sodium chloride flush 0.9 % injection 5-40 mL  5-40 mL IntraVENous PRN    0.9 % sodium chloride infusion    IntraVENous PRN    enoxaparin (LOVENOX) injection 40 mg  40 mg SubCUTAneous Daily    ondansetron (ZOFRAN-ODT) disintegrating tablet 4 mg  4 mg Oral Q8H PRN    Or    ondansetron (ZOFRAN) injection 4 mg  4 mg IntraVENous Q6H PRN    polyethylene glycol (GLYCOLAX) packet 17 g  17 g Oral Daily PRN    acetaminophen (TYLENOL) tablet 650 mg  650 mg Oral Q6H PRN    Or    acetaminophen (TYLENOL) suppository 650 mg  650 mg Rectal Q6H PRN    potassium chloride 10 mEq/100 mL IVPB (Peripheral Line)  10 mEq IntraVENous PRN    magnesium sulfate 2000 mg in 50 mL IVPB premix  2,000 mg IntraVENous PRN    furosemide (LASIX) injection 40 mg  40 mg IntraVENous Once    vancomycin (VANCOCIN) intermittent dosing (placeholder)   Other RX Placeholder    piperacillin-tazobactam (ZOSYN) 3,375 mg in sodium chloride 0.9 % 50 mL IVPB (mini-bag)  3,375 mg IntraVENous q8h    Vancomycin random level 7/17 at 17:00  1 each Other Once    melatonin tablet 5 mg  5 mg Oral Nightly PRN       Review of Systems:   Pertinent items are noted in HPI.    Objective:   Physical Exam:     BP 106/64  Pulse 83   Temp 97.2 F (36.2 C) (Axillary)   Resp 15   Ht 1.803 m (_0 )   Wt 68 kg (150 lb)   SpO2 98%   BMI 20.92 kg/m       Patient Vitals for the past 8 hrs:   Temp Pulse Resp BP SpO2   07/24/21 1109 -- 83 15 -- 98 %   07/24/21 1104 -- 91 20 -- 96 %   07/24/21 1100 97.2 F (36.2 C) -- -- 106/64 --   07/24/21 1059 -- 91 19 -- 97 %   07/24/21 1054 -- 97 19 -- 96 %   07/24/21 1049 -- 97 22 -- 98 %   07/24/21 1044 -- 92 21 -- 96 %   07/24/21 1039 -- 94 24 -- 97 %   07/24/21 1034 -- (!) 101 22 -- 96 %   07/24/21 1030 -- -- -- 107/65 --   07/24/21 1029 -- 86 (!) 31 -- 97 %   07/24/21 1000 -- 89 28 110/71 98 %   07/24/21 0900 -- 90 (!) 34 -- 97 %   07/24/21 0851 -- 92 21 -- 94 %   07/24/21 0800 -- 93 28 99/66 96 %   07/24/21 0730 -- 94 (!) 33 -- 96 %   07/24/21 0700 98.2 F (36.8 C) 89 28 -- 96 %   07/24/21 0630 -- 94 (!) 31 -- 98 %   07/24/21 0600 -- 84 29  99/65 97 %          Temp (24hrs), Avg:97.9 F (36.6 C), Min:97.2 F (36.2 C), Max:98.4 F (36.9 C)    No intake/output data recorded.   07/15 1901 - 07/17 0700  In: -   Out: 1750 [[id number]:[id number]]    General:  Alert, cooperative, no distress, appears stated age.   Lungs:   Patient is also with a completely absent breath sounds on the right, with rhonchi on the right lower lobes noted without any active wheezing.   Chest wall:  No tenderness or deformity.   Heart:  Regular rate and rhythm, S1, S2 normal, no murmur, click, rub or gallop.   Abdomen:   Soft, non-tender. Bowel sounds normal. No masses,  No organomegaly.   Extremities: Extremities normal, atraumatic, no cyanosis or edema.   Pulses: 2+ and symmetric all extremities.   Skin: Skin color, texture, turgor normal. No rashes or lesions   Neurologic: No gross sensory or motor deficits     Data Review:       Recent Days:  Recent Labs     07/23/21  1523 07/24/21  0500   WBC 36.0* 32.0*   HGB 9.5* 8.7*   HCT 26.7* 24.5*   PLT 554* 645*     Recent Labs     07/23/21  1523 07/24/21  0500   NA 138 143   K 3.7 3.0*   CL 102 116*   CO2 18* 18*   BUN 53* 35*   PHOS  --  6.4*   ALT 45 40   INR  --  1.5*     No results for input(s): PH, PCO2, PO2, HCO3, FIO2 in the last 72 hours.    24 Hour Results:  Recent Results (from the past 24 hour(s))   CBC with Auto Differential    Collection Time: 07/23/21  3:23 PM   Result Value Ref Range    WBC 36.0 (H) 4.1 - 11.1 K/uL    RBC 3.11 (L) 4.10 -  5.70 M/uL    Hemoglobin 9.5 (L) 12.1 - 17.0 g/dL    Hematocrit 26.7 (L) 36.6 - 50.3 %    MCV 85.9 80.0 - 99.0 FL    MCH 30.5 26.0 - 34.0 PG    MCHC 35.6 30.0 - 36.5 g/dL    RDW 14.6 (H) 11.5 - 14.5 %    Platelets 554 (H) 150 - 400 K/uL    MPV 11.2 8.9 - 12.9 FL    Neutrophils % 88 (H) 32 - 75 %    Lymphocytes % 3 (L) 12 - 49 %    Monocytes % 8 5 - 13 %    Eosinophils % 0 0 - 7 %    Basophils % 0 0 - 1 %    Immature Granulocytes 1 %    Neutrophils Absolute 31.6 (H) 1.8 - 8.0 K/UL    Lymphocytes  Absolute 1.1 0.8 - 3.5 K/UL    Monocytes Absolute 2.9 (H) 0.0 - 1.0 K/UL    Eosinophils Absolute 0.0 0.0 - 0.4 K/UL    Basophils Absolute 0.0 0.0 - 0.1 K/UL    Absolute Immature Granulocyte 0.4 K/UL    Differential Type AUTOMATED      RBC Comment Normocytic, Normochromic     CMP    Collection Time: 07/23/21  3:23 PM   Result Value Ref Range    Sodium 138 136 - 145 mmol/L    Potassium 3.7 3.5 - 5.1 mmol/L    Chloride 102 97 - 108 mmol/L    CO2 18 (L) 21 - 32 mmol/L    Anion Gap 18 (H) 5 - 15 mmol/L    Glucose 80 65 - 100 mg/dL    BUN 53 (H) 6 - 20 mg/dL    Creatinine 2.32 (H) 0.70 - 1.30 mg/dL    Bun/Cre Ratio 23 (H) 12 - 20      Est, Glom Filt Rate 36 (L) >60 ml/min/1.79m    Calcium 9.0 8.5 - 10.1 mg/dL    Total Bilirubin 0.5 0.2 - 1.0 mg/dL    AST 74 (H) 15 - 37 U/L    ALT 45 12 - 78 U/L    Alk Phosphatase 191 (H) 45 - 117 U/L    Total Protein 6.8 6.4 - 8.2 g/dL    Albumin 1.9 (L) 3.5 - 5.0 g/dL    Globulin 4.9 (H) 2.0 - 4.0 g/dL    Albumin/Globulin Ratio 0.4 (L) 1.1 - 2.2     Lipase    Collection Time: 07/23/21  3:23 PM   Result Value Ref Range    Lipase 29 (L) 73 - 393 U/L   Troponin    Collection Time: 07/23/21  3:23 PM   Result Value Ref Range    Troponin, High Sensitivity <4 0 - 76 ng/L   Iron and TIBC    Collection Time: 07/23/21  3:23 PM   Result Value Ref Range    Iron 21 (L) 35 - 150 ug/dL    TIBC 168 (L) 250 - 450 ug/dL    Iron % Saturation 13 (L) 20 - 50 %   Vitamin B12    Collection Time: 07/23/21  3:23 PM   Result Value Ref Range    Vitamin B-12 >2000 (H) 193 - 986 pg/mL   Hepatitis Panel, Acute    Collection Time: 07/23/21  3:23 PM   Result Value Ref Range    Hep A IgM NONREACTIVE NONREACTIVE      -  Hepatitis B Surface Ag <0.10 Index    Interpretation Negative Negative      -        Hep B Core Ab, IgM NONREACTIVE NONREACTIVE    -        Hepatitis C Ab 0.10 Index    Interpretation NONREACTIVE NONREACTIVE   Lactate Dehydrogenase    Collection Time: 07/23/21  3:23 PM   Result Value Ref Range     LD 247 (H) 85 - 241 U/L   Haptoglobin    Collection Time: 07/23/21  3:23 PM   Result Value Ref Range    Haptoglobin 528 (H) 30 - 200 mg/dL   POC Lactic Acid    Collection Time: 07/23/21  3:50 PM   Result Value Ref Range    POC Lactic Acid 0.68 0.40 - 2.00 mmol/L    Performed by: Lenn Sink    Venous Blood Gas, POC    Collection Time: 07/23/21  5:53 PM   Result Value Ref Range    PH, VENOUS (POC) 7.34 7.23 - 7.44      PCO2, Venus, POC 27.8 (L) 41.0 - 52.0 mmHg    PO2, VENOUS (POC) 53 (H) 25 - 40 mmHg    HCO3, Venous 15.0 (L) 22.0 - 26.0 mmol/L    SO2, VENOUS (POC) 85.4 %    Base Deficit, Venous 9.6 mmol/L    Specimen type: Venous      Performed by: Lenn Sink    MRSA by PCR    Collection Time: 07/23/21  8:25 PM    Specimen: Swab   Result Value Ref Range    MRSA by PCR Not Detected Not Detected     Sedimentation Rate    Collection Time: 07/23/21 10:55 PM   Result Value Ref Range    Sed Rate, Automated 73 (H) 0 - 15 mm/hr   Urine Drug Screen    Collection Time: 07/24/21 12:30 AM   Result Value Ref Range    Amphetamine, Urine Negative Negative      Barbiturates, Urine Negative Negative      Benzodiazepines, Urine Negative Negative      Cocaine, Urine Negative Negative      Methadone, Urine Negative Negative      Opiates, Urine Negative Negative      PCP, Urine Negative Negative      THC, TH-Cannabinol, Urine Negative Negative      Comments:        This test is a screen for drugs of abuse in a medical setting only (i.e., they are unconfirmed results and as such must not be used for non-medical purposes, e.g.,employment testing, legal testing). Due to its inherent nature, false positive (FP) and false negative (FN) results may be obtained. Therefore, if necessary for medical care, recommend confirmation of positive findings by GC/MS.     Urinalysis with Reflex to Culture    Collection Time: 07/24/21 12:30 AM    Specimen: Urine   Result Value Ref Range    Color, UA Yellow/Straw      Appearance Clear Clear       Specific Gravity, UA 1.012 1.003 - 1.030      pH, Urine 5.0 5.0 - 8.0      Protein, UA Negative Negative mg/dL    Glucose, UA Negative Negative mg/dL    Ketones, Urine Negative Negative mg/dL    Bilirubin Urine Negative Negative      Blood, Urine Small (A) Negative      Urobilinogen, Urine 0.1  0.1 - 1.0 EU/dL    Nitrite, Urine Negative Negative      Leukocyte Esterase, Urine Negative Negative      BACTERIA, URINE Negative Negative /hpf    Urine Culture if Indicated Culture not indicated by UA result Culture not indicated by UA result      WBC, UA 5-10 0 - 4 /hpf    RBC, UA 0-5 0 - 5 /hpf    Mucus, UA Trace /lpf   Protein / creatinine ratio, urine    Collection Time: 07/24/21 12:30 AM   Result Value Ref Range    Protein, Urine, Random 46 (H) 0.0 - 11.9 mg/dL    Creatinine, Ur 52.00 mg/dL    PROTEIN/CREAT RATIO URINE RAN 0.9     Protime-INR    Collection Time: 07/24/21  5:00 AM   Result Value Ref Range    Protime 19.1 (H) 11.9 - 14.6 sec    INR 1.5 (H) 0.9 - 1.1     CBC with Auto Differential    Collection Time: 07/24/21  5:00 AM   Result Value Ref Range    WBC 32.0 (H) 4.1 - 11.1 K/uL    RBC 2.89 (L) 4.10 - 5.70 M/uL    Hemoglobin 8.7 (L) 12.1 - 17.0 g/dL    Hematocrit 24.5 (L) 36.6 - 50.3 %    MCV 84.8 80.0 - 99.0 FL    MCH 30.1 26.0 - 34.0 PG    MCHC 35.5 30.0 - 36.5 g/dL    RDW 14.6 (H) 11.5 - 14.5 %    Platelets 645 (H) 150 - 400 K/uL    MPV 10.1 8.9 - 12.9 FL    Nucleated RBCs 0.0 0.0 PER 100 WBC    nRBC 0.00 0.00 - 0.01 K/uL    Neutrophils % 89 (H) 32 - 75 %    Lymphocytes % 8 (L) 12 - 49 %    Monocytes % 3 (L) 5 - 13 %    Eosinophils % 0 0 - 7 %    Basophils % 0 0 - 1 %    Immature Granulocytes 0 %    Neutrophils Absolute 28.4 (H) 1.8 - 8.0 K/UL    Lymphocytes Absolute 2.6 0.8 - 3.5 K/UL    Monocytes Absolute 1.0 0.0 - 1.0 K/UL    Eosinophils Absolute 0.0 0.0 - 0.4 K/UL    Basophils Absolute 0.0 0.0 - 0.1 K/UL    Absolute Immature Granulocyte 0.0 K/UL    Differential Type Manual      RBC Comment Burr  cells  3+        RBC Comment Target Cells  1+       Lactic Acid    Collection Time: 07/24/21  5:00 AM   Result Value Ref Range    Lactic Acid, Plasma 1.1 0.4 - 2.0 mmol/L   Comprehensive Metabolic Panel w/ Reflex to MG    Collection Time: 07/24/21  5:00 AM   Result Value Ref Range    Sodium 143 136 - 145 mmol/L    Potassium 3.0 (L) 3.5 - 5.1 mmol/L    Chloride 116 (H) 97 - 108 mmol/L    CO2 18 (L) 21 - 32 mmol/L    Anion Gap 9 5 - 15 mmol/L    Glucose 94 65 - 100 mg/dL    BUN 35 (H) 6 - 20 mg/dL    Creatinine 1.36 (H) 0.70 - 1.30 mg/dL    Bun/Cre Ratio 26 (H) 12 - 20  Est, Glom Filt Rate >60 >60 ml/min/1.37m    Calcium 8.4 (L) 8.5 - 10.1 mg/dL    Total Bilirubin 0.8 0.2 - 1.0 mg/dL    AST 50 (H) 15 - 37 U/L    ALT 40 12 - 78 U/L    Alk Phosphatase 177 (H) 45 - 117 U/L    Total Protein 5.8 (L) 6.4 - 8.2 g/dL    Albumin 1.8 (L) 3.5 - 5.0 g/dL    Globulin 4.0 2.0 - 4.0 g/dL    Albumin/Globulin Ratio 0.5 (L) 1.1 - 2.2     Phosphorus    Collection Time: 07/24/21  5:00 AM   Result Value Ref Range    Phosphorus 6.4 (H) 2.6 - 4.7 mg/dL   pH, Body Fluid    Collection Time: 07/24/21 11:00 AM   Result Value Ref Range    pH, Fluid 8.0     Cell Count with Differential, Body Fluid    Collection Time: 07/24/21 11:00 AM   Result Value Ref Range    Specimen Pleural fluid      Color, Fluid Straw      Appearance, Fluid Turbid      RBC, Fluid >100 (H) 0 /cu mm    Total Nucleated Cell Count 67,022 /cu mm    Polys, Fluid PENDING %    Bands, Fluid PENDING %    Lymphocytes, Body Fluid PENDING %    Monocyte Count, Fluid PENDING %    Eos, Fluid PENDING %   C-Reactive Protein    Collection Time: 07/24/21 11:38 AM   Result Value Ref Range    CRP 28.90 (H) 0.00 - 0.60 mg/dL   Lactate Dehydrogenase    Collection Time: 07/24/21 11:38 AM   Result Value Ref Range    LD 162 85 - 241 U/L   Procalcitonin    Collection Time: 07/24/21 11:38 AM   Result Value Ref Range    Procalcitonin 3.20 (H) 0 ng/mL       RADIOLOGY:  XR CHEST PORTABLE    Result  Date: 07/24/2021  EXAM: XR CHEST PORTABLE DATE: 07/24/2021 11:34 AM INDICATION: post right chest drain insertion COMPARISON: Chest radiograph July 23, 2021 FINDINGS: AP portable chest radiograph.  There is a RIGHT sided pigtail pleural catheter in place. There is ongoing moderate to large volume right-sided effusion and RIGHT lung airspace disease. No pneumothorax is seen. The LEFT lung is well-expanded and clear. The heart size is normal.     Right-sided pleural drain in place. No pneumothorax. There is ongoing moderate to large right-sided effusion.     XR CHEST PORTABLE    Result Date: 07/23/2021  EXAM:  XR CHEST PORTABLE INDICATION: Shortness of breath COMPARISON: none TECHNIQUE: portable chest AP view obtained portably at 1528 hours FINDINGS: The cardiac silhouette is within normal limits. The pulmonary vasculature is within normal limits. There is a large right pleural effusion with associated airspace disease throughout the right lung. The visualized bones and upper abdomen are age-appropriate.     Large right pleural effusion with associated airspace disease, not better characterized.    CT CHEST ABDOMEN PELVIS WO CONTRAST Additional Contrast? None    Result Date: 07/23/2021  INDICATION: Upper abdominal pain, right flank pain COMPARISON: None TECHNIQUE: Noncontrast helical CT of the chest, abdomen, and pelvis. Oral contrast was not administered.  Coronal and sagittal reconstructions were generated. CT dose reduction was achieved through use of a standardized protocol tailored for this examination and automatic exposure control for dose  modulation. Absence of intravenous contrast limits evaluation of the mediastinum, hila, vasculature, and solid organs. FINDINGS: THYROID: No nodule. MEDIASTINUM: Multiple prominent mediastinal lymph nodes along the right upper and lower paratracheal stations. HILA: No gross pathology. THORACIC AORTA: No dissection or aneurysm. MAIN PULMONARY ARTERY: Normal in caliber.  TRACHEA/BRONCHI: Patent. ESOPHAGUS: No wall thickening or dilatation. HEART: Normal in size.  Cononary artery calcium:  Present PLEURA: Moderate to large loculated right pleural effusion. LUNGS: Heterogeneous right lower lobe airspace opacification and passive atelectasis of the right upper lobe. Mild nodular airspace disease in the posterior left lower lobe. LIVER: No mass or biliary dilatation. GALLBLADDER: Unremarkable. SPLEEN: No mass. PANCREAS: No gross pathology. ADRENALS: Unremarkable. KIDNEYS: No nephrolithiasis or hydronephrosis. STOMACH: Unremarkable. SMALL BOWEL: Dilated small bowel loops in the pelvis with evidence of delayed transit. Terminal ileal bowel loops are circumferentially thickened leading up to the neoterminal ileum. COLON: Postsurgical changes from right hemicolectomy. APPENDIX: Not seen PERITONEUM: No ascites or pneumoperitoneum. RETROPERITONEUM: No lymphadenopathy or aortic aneurysm. REPRODUCTIVE ORGANS: Normal URINARY BLADDER: No mass or calculus. BONES: No destructive bone lesion. ADDITIONAL COMMENTS: N/A     1.  Severe right lower lobe pneumonia with large loculated right pleural effusion. 2.  Reactive mediastinal lymphadenopathy. 3.  Inflamed distal small bowel. Correlate for inflammatory bowel disease. 4.  No bowel obstruction.           Assessment/Plan:     Sepsis   SIRS criteria 3/4 with SOFA   Vanco/Zosyn initiated for HAP coverage and given chronic immunosuppressant therapy  Pan Cx ordered with additional Plueral fluid eval pending pulmonology and IR consultation.  Significantly elevated procalcitonin noted.    Acute respiratory failure with hypoxia   Progressive DOE described w/ hypoxia present on arrival and  severe RLL infiltrate with associated large loculated right pleural effusion noted on CT Chest  Currently only requiring 2L NC O2.     Loculated pleural effusion with right-sided pneumonia  Empiric Vanco/Zosyn initiated for HAP coverage given chronic immunosuppressant tx  and IVDU   Urgent thoracentesis anticipated with Pan CX and MRSA Nasal swab ordered  Pending pulmonology consultation.    Acute Renal failure  Foley cath placed and Urine lytes ordered  Monitor renal fxn with gentle IVF support and diuresis with Albumin    Severe protein-calorie malnutrition (HCC)  Progressive Wt loss, weakness and evidence hypoalbuminemia noted on admission  Nutritional eval requested     Normocytic anemia  Chronicity unclear w/o evidence of active bleeding present  Follow up Iron/B12 and hemolytic studies ordered     Metabolic acidosis  AG acidosis present w/o evidence of shock present and underlying Ketoacidosis suspected  HCO3 support via MIV initiated and maintain MAP>60  Urine lytes ordered.    DVT prophylaxis  Lovenox SC.    Full CODE STATUS    Discussion/MDM: Patient with multiple medical comorbidities, each with high likelihood for morbidity and mortality if left untreated.   I have reviewed patient's presenting subjective and objective findings, as well as all laboratory studies, imaging studies, and vital signs to date as well as treatment rendered and patient's response to those treatments.  In addition, prior medical, surgical and relevant social and family histories were reviewed. I have discussed management plan with patient/family and with nursing staff.      Toxic drug monitoring:      Care Plan discussed with: Patient/Family and Nurse    Total time spent with patient:40 minutes.    With greater than 50% spent in coordination of  care and counseling.    Bernie Covey, MD

## 2021-07-24 NOTE — Consults (Addendum)
Pulmonary/ CC Consult    Subjective:   Date of Consultation:  July 24, 2021  Referring Physician: Gertie Baron, DO    [name] is a 41 years old Caucasian male who is known to have history of Crohn's disease, history of intravenous drug use.  He usually follows with [facility] at [facility] for his Crohn's disease.  Patient is admitted with 2 weeks history of not feeling well, right-sided pleuritic chest pain and generalized weakness.  Later on he started running fever with cough productive of scanty amount of creamy colored sputum.  Possibly got worse, ultimately came to the ER.  Chest x-ray was done in his initial work-up which showed right lung infiltrates.  He was also noted to have pleural effusion.  His initial WBC count was 36,000.  Patient was also noted to have elevated BUN and creatinine of 53 and 2.32.  Patient is admitted for sepsis with large loculated right pleural effusion and pneumonia.  Patient noted that his last intravenous drug use of fentanyl was about 2 weeks ago      Patient Active Problem List   Diagnosis    Sepsis (HCC)    Renal failure    Metabolic acidosis    Normocytic anemia    Acute respiratory failure with hypoxia (HCC)    Severe protein-calorie malnutrition (HCC)     Past Medical History:   Diagnosis Date    Crohn's disease (HCC)     IV drug abuse (HCC)       History reviewed. No pertinent family history.   Social History     Tobacco Use    Smoking status: Some Days     Types: Cigarettes    Smokeless tobacco: Never   Substance Use Topics    Alcohol use: Not Currently     History reviewed. No pertinent surgical history.   Prior to Admission medications    Medication Sig Start Date End Date Taking? Authorizing Provider   lamoTRIgine (LAMICTAL) 100 MG tablet Take by mouth daily   Yes Historical Provider, MD   adalimumab (HUMIRA) 40 MG/0.8ML injection Inject 0.8 mLs into the skin every 14 days   Yes Historical Provider, MD   mercaptopurine  (PURINETHOL) 50 MG chemo tablet Take 1.5 tablets by mouth every evening   Yes Historical Provider, MD   promethazine (PHENERGAN) 25 MG tablet Take 1 tablet by mouth every 6 hours as needed for Nausea   Yes Historical Provider, MD     Allergies   Allergen Reactions    Sulfa Antibiotics Rash        Review of Systems:  A comprehensive review of systems was negative except for that written in the History of Present Illness.    Labs:    Recent Labs     07/23/21  1523 07/24/21  0500   WBC 36.0* 32.0*   HGB 9.5* 8.7*   HCT 26.7* 24.5*   PLT 554* 645*     Recent Labs     07/23/21  1523 07/24/21  0500   NA 138 143   K 3.7 3.0*   CL 102 116*   CO2 18* 18*   GLUCOSE 80 94   BUN 53* 35*   CREATININE 2.32* 1.36*   CALCIUM 9.0 8.4*   PHOS  --  6.4*   BILITOT 0.5 0.8   AST 74* 50*   ALT 45 40   INR  --  1.5*     No results  for input(s): PHART, PCO2ART, PO2ART, HCO3ART, BEART, TCO2ARRT, HGBART, PO2CORART, FIO2A, O2SATART, OXYHEM, CARBOXHGBART, METHGBART, O2CONTART, PHCORART, TEMP in the last 72 hours.    Invalid input(s): PCO2COART      Objective:   Blood pressure 99/65, pulse 92, temperature 98.2 F (36.8 C), temperature source Oral, resp. rate 21, height 1.803 m (5\' 11" ), weight 68 kg (150 lb), SpO2 94 %.  Temp (24hrs), Avg:98 F (36.7 C), Min:97.3 F (36.3 C), Max:98.4 F (36.9 C)    CT CHEST ABDOMEN PELVIS WO CONTRAST Additional Contrast? None   Final Result   1.  Severe right lower lobe pneumonia with large loculated right pleural   effusion.   2.  Reactive mediastinal lymphadenopathy.   3.  Inflamed distal small bowel. Correlate for inflammatory bowel disease.   4.  No bowel obstruction.         XR CHEST PORTABLE   Final Result      Large right pleural effusion with associated airspace disease, not better   characterized.         Data Review:   Current Facility-Administered Medications   Medication Dose Route Frequency    lactated ringers IV soln infusion   IntraVENous Continuous    promethazine (PHENERGAN) 6.25 MG/5ML  syrup 6.25 mg  6.25 mg Oral Q4H PRN    sodium bicarbonate 50 mEq in sodium chloride 0.45 % 1,000 mL infusion   IntraVENous Continuous    lamoTRIgine (LAMICTAL) tablet 200 mg  200 mg Oral Daily    sodium chloride flush 0.9 % injection 5-40 mL  5-40 mL IntraVENous 2 times per day    sodium chloride flush 0.9 % injection 5-40 mL  5-40 mL IntraVENous PRN    0.9 % sodium chloride infusion   IntraVENous PRN    enoxaparin (LOVENOX) injection 40 mg  40 mg SubCUTAneous Daily    ondansetron (ZOFRAN-ODT) disintegrating tablet 4 mg  4 mg Oral Q8H PRN    Or    ondansetron (ZOFRAN) injection 4 mg  4 mg IntraVENous Q6H PRN    polyethylene glycol (GLYCOLAX) packet 17 g  17 g Oral Daily PRN    acetaminophen (TYLENOL) tablet 650 mg  650 mg Oral Q6H PRN    Or    acetaminophen (TYLENOL) suppository 650 mg  650 mg Rectal Q6H PRN    potassium chloride 10 mEq/100 mL IVPB (Peripheral Line)  10 mEq IntraVENous PRN    magnesium sulfate 2000 mg in 50 mL IVPB premix  2,000 mg IntraVENous PRN    furosemide (LASIX) injection 40 mg  40 mg IntraVENous Once    vancomycin (VANCOCIN) intermittent dosing (placeholder)   Other RX Placeholder    piperacillin-tazobactam (ZOSYN) 3,375 mg in sodium chloride 0.9 % 50 mL IVPB (mini-bag)  3,375 mg IntraVENous q8h    Vancomycin random level 7/17 at 17:00  1 each Other Once    melatonin tablet 5 mg  5 mg Oral Nightly PRN        Exam:      Patient is currently alert and responding appropriately.  Mildly tachypneic.  Head normocephalic and atraumatic, pupils are round sponsored light sclera icteric and conjunctive are pink  Neck is supple.  No cervical lymphadenopathy.  Thyroid not enlarged  Chest: Has rhonchi audible right lung base along with decreased right-sided air entry at base.  No chest wall tenderness noted.  No wheezing was appreciated.    Abdomen: Soft, nontender, no visceromegaly  Extremities: No edema, sinus or clubbing  Neuro: No focal  motor deficit    Impression:   This is middle aged male with  known history of Crohn's disease, IV drug use who has been feeling sick for the last 2 weeks with generalized weakness, tiredness and fatigue, low-grade fever along with cough.  Noted to have elevated WBC count of 36,000 on admission, chest x-ray and CT scan of chest showing moderate to large right loculated pleural effusion along with right basal infiltrate.  Patient also has elevated BUN and creatinine.    Plan:   1.  Hypoxic respiratory failure:  Secondary to moderate to large loculated right pleural effusion along with dense right lower lobe pneumonia.  We will continue with nasal cannula oxygen.  2.  Severe sepsis:  Patient has severe sepsis with elevated WBC count, acute renal failure, tachycardia and tachypnea.  Lactic acid will be checked on serial basis.  Check procalcitonin level for prognosis ankle improvement.  Started on IV Zosyn.  3.  Loculated right pleural effusion:  Patient has loculated right pleural effusion, seems to be parapneumonic effusion.  Consult generated for IR for right thoracentesis.  We will send pleural fluid for Gram stain, culture and sensitivity, cytology  Continue IV Zosyn, IV vancomycin.  Patient was initially given a dose of Rocephin and Zithromax in the ER.   check serial CBC.  4. IV drug use  he is counseled to quit IV drug use  5.  Crohn's disease:  Patient is immunosuppressed from Crohn's disease.  6.  Acute renal failure:  Secondary to volume contraction, sepsis  Continue with IV fluids.  Repeat basic metabolic profile in the morning    Management is discussed with patient and covering nurse  More than 55 minutes were spent in patient's evaluation, review of lab data, imaging studies and decision making    .  A. Thompson Grayer, MD  [facility]     This dictation was done by dragon, computer voice recognition software.  Often unanticipated grammatical, syntax, Homer phones and other interpretive errors are inadvertently transcribed.  Please excuse  errors that have escaped final proofreading

## 2021-07-24 NOTE — Care Coordination-Inpatient (Signed)
07/24/21 0917   Service Assessment   Patient Orientation Alert and Oriented   Cognition Alert   History Provided By Patient   Primary Caregiver Self   Accompanied By/Relationship Pt alonbe during interview.   Support Systems Parent;Family Members;Friends/Neighbors   Patient's Healthcare Decision Maker is: Scientist, research (physical sciences) Next of [place]   PCP Verified by CM Yes  (Per pt no PCP.)   Last Visit to PCP   (No PCP.)   Prior Functional Level Independent in ADLs/IADLs   Current Functional Level Independent in ADLs/IADLs   Can patient return to prior living arrangement Yes   Ability to make needs known: Good   Family able to assist with home care needs: Yes   Would you like for me to discuss the discharge plan with any other family members/significant others, and if so, who? Yes  (Mother Dossie Der.)   Financial Resources Medicaid;Medicare   Community Resources None   Social/Functional History   Lives With Family   Type of [place] One level   Home Access Level entry   [place] unit   [place] Responsibilities Yes   Ambulation Assistance Independent   Active Driver Yes   Occupation Part time employment   Discharge Planning   Type of [place] Family Members   Current Services Prior To Admission None   Potential Assistance Needed N/A   DME Ordered? No   Potential Assistance Purchasing Medications No   Type of Home Care Services None   Patient expects to be discharged to: House   One/Two Story Residence One story   History of falls? 0   Services At/After Discharge   Transition of Care Consult (CM Consult) Discharge [place] Discharge None   Veteran Resource Information Provided? No   Mode of Transport at Discharge Other (see comment)  (Family/Friend to transport home.)    Confirm Follow Up Transport Friends  (Or Family)     CM met with pt & D/C Plan is home with family & family/friend to transport. Send Rxs to [employer] upon discharge. Uses no DME.

## 2021-07-24 NOTE — OR Nursing (Addendum)
Right posterior chest pigtail drain inserted, 100cc clear yellow fluid drained, then placed on Oasis collection device -20 cm water seal and per Dr Ronny Flurry, [place] to remain at water seal and no need for wall suction. Post cxr ordered. Verbal report to [place], Charity fundraiser

## 2021-07-24 NOTE — Progress Notes (Signed)
Spiritual Care Assessment/Progress Note  [facility]    Name: REMO KIRSCHENMANN MRN: [id number]    Age: 41 y.o.     Sex: male   Language: English     Date: 07/24/2021            Total Time Calculated: 5 min              Spiritual Assessment begun in [facility] ICU  Service Provided For:: Patient  Referral/Consult From:: Nurse  Encounter Overview/Reason : Initial Encounter    Spiritual beliefs:      [x]  Involved in a faith tradition/spiritual practice: Catholic     []  Supported by a faith community:      []  Claims no spiritual orientation:      []  Seeking spiritual identity:           []  Adheres to an individual form of spirituality:      []  Not able to assess:                Identified resources for coping and support system:   Support System: Unknown       []  Prayer                  []  Devotional reading               []  Music                  []  Guided Imagery     []  Pet visits                                        []  Other: (COMMENT)     Specific area/focus of visit   Encounter: Type: Initial Screen/Assessment  Crisis:    Spiritual/Emotional needs:    Ritual, Rites and Sacraments: Type: Sacrament (Comment) (Patient requested communion with )  Grief, Loss, and Adjustments:    Ethics/Mediation:    Behavioral Health:    Palliative Care:    Advance Care Planning:      Plan/Referrals: Placed on Catholic Communion List    Narrative: Patient seen by , M. Div. Chaplain Intern as consult for . Chaplain submitted request to Father , who agrees to officiate communion with patient on 07/25/21.  , M. Div. Chaplain Intern

## 2021-07-24 NOTE — Progress Notes (Signed)
1545: Provided mother with update.

## 2021-07-25 LAB — BASIC METABOLIC PANEL
Anion Gap: 6 mmol/L (ref 5–15)
BUN: 21 mg/dL — ABNORMAL HIGH (ref 6–20)
Bun/Cre Ratio: 22 — ABNORMAL HIGH (ref 12–20)
CO2: 21 mmol/L (ref 21–32)
Calcium: 7.9 mg/dL — ABNORMAL LOW (ref 8.5–10.1)
Chloride: 113 mmol/L — ABNORMAL HIGH (ref 97–108)
Creatinine: 0.94 mg/dL (ref 0.70–1.30)
Est, Glom Filt Rate: >60 mL/min/{1.73_m2} (ref >60)
Glucose: 88 mg/dL (ref 65–100)
Potassium: 3.8 mmol/L (ref 3.5–5.1)
Sodium: 140 mmol/L (ref 136–145)

## 2021-07-25 LAB — ECHO (TTE) COMPLETE (PRN CONTRAST/BUBBLE/STRAIN/3D)
AV Area by Peak Velocity: 1.9 cm2
AV Area by VTI: 2.4 cm2
AV Mean Gradient: 4 mmHg
AV Mean Velocity: 0.8 m/s
AV Peak Gradient: 10 mmHg
AV Peak Velocity: 1.6 m/s
AV VTI: 22.6 cm
AV Velocity Ratio: 0.56
AVA/BSA Peak Velocity: 1 cm2/m2
AVA/BSA VTI: 1.3 cm2/m2
Ao Root Index: 1.12 cm/m2
Aortic Root: 2.1 cm
Body Surface Area: 1.85 m2
E/E' Lateral: 3.83
E/E' Ratio (Averaged): 4.98
E/E' Septal: 6.13
Est. RA Pressure: 8 mmHg
Fractional Shortening 2D: 51 % (ref 28–44)
IVSd: 0.9 cm (ref 0.6–1.0)
LA Area 2C: 11.5 cm2
LA Area 4C: 19.2 cm2
LA Major Axis: 5.3 cm
LA Minor Axis: 4.8 cm
LA Volume A-L A4C: 24 mL (ref 18–58)
LA Volume A-L A4C: 55 mL (ref 18–58)
LA Volume BP: 35 mL (ref 18–58)
LA Volume Index A-L A2C: 13 mL/m2 — AB (ref 16–34)
LA Volume Index A-L A4C: 29 mL/m2 (ref 16–34)
LA Volume Index BP: 19 ml/m2 (ref 16–34)
LV E' Lateral Velocity: 24 cm/s
LV E' Septal Velocity: 15 cm/s
LV EDV A2C: 104 mL
LV EDV A4C: 78 mL
LV EDV Index A2C: 56 mL/m2
LV EDV Index A4C: 42 mL/m2
LV ESV A2C: 37 mL
LV ESV A4C: 20 mL
LV ESV Index A2C: 20 mL/m2
LV ESV Index A4C: 11 mL/m2
LV Ejection Fraction A2C: 65 %
LV Ejection Fraction A4C: 74 %
LV Mass 2D Index: 107.2 g/m2 (ref 49–115)
LV Mass 2D: 200.5 g (ref 88–224)
LV RWT Ratio: 0.53
LVIDd Index: 2.62 cm/m2
LVIDd: 4.9 cm (ref 4.2–5.9)
LVIDs Index: 1.28 cm/m2
LVIDs: 2.4 cm
LVOT Area: 3.5 cm2
LVOT Diameter: 2.1 cm
LVOT Mean Gradient: 2 mmHg
LVOT Peak Gradient: 3 mmHg
LVOT Peak Velocity: 0.9 m/s
LVOT SV: 55 ml
LVOT Stroke Volume Index: 29.4 mL/m2
LVOT VTI: 15.9 cm
LVOT:AV VTI Index: 0.7
LVPWd: 1.3 cm — AB (ref 0.6–1.0)
MR Peak Gradient: 7 mmHg
MR Peak Velocity: 1.3 m/s
MV A Velocity: 0.7 m/s
MV E Velocity: 0.92 m/s
MV E/A: 1.31
PV Max Velocity: 1.3 m/s
PV Peak Gradient: 7 mmHg
RA Area 4C: 11.5 cm2
RA Volume Index A4C: 12 mL/m2
RA Volume: 22 ml
RV Basal Dimension: 4.6 cm
RV Free Wall Peak S': 31 cm/s
RV Mid Dimension: 3.4 cm
RVSP: 30 mmHg
TAPSE: 2.5 cm (ref 1.7–?)
TR Max Velocity: 2.35 m/s
TR Peak Gradient: 22 mmHg

## 2021-07-25 LAB — CYTOLOGY, NON-GYN

## 2021-07-25 LAB — CBC WITH AUTO DIFFERENTIAL
Absolute Immature Granulocyte: 1.2 10*3/uL — ABNORMAL HIGH (ref 0.00–0.04)
Basophils %: 0 % (ref 0–1)
Basophils Absolute: 0.1 10*3/uL (ref 0.0–0.1)
Eosinophils %: 1 % (ref 0–7)
Eosinophils Absolute: 0.2 10*3/uL (ref 0.0–0.4)
Hematocrit: 23.5 % — ABNORMAL LOW (ref 36.6–50.3)
Hemoglobin: 8.3 g/dL — ABNORMAL LOW (ref 12.1–17.0)
Immature Granulocytes: 4 % — ABNORMAL HIGH (ref 0–0.5)
Lymphocytes %: 5 % — ABNORMAL LOW (ref 12–49)
Lymphocytes Absolute: 1.5 10*3/uL (ref 0.8–3.5)
MCH: 30.1 PG (ref 26.0–34.0)
MCHC: 35.3 g/dL (ref 30.0–36.5)
MCV: 85.1 FL (ref 80.0–99.0)
MPV: 10.6 FL (ref 8.9–12.9)
Monocytes %: 10 % (ref 5–13)
Monocytes Absolute: 3 10*3/uL — ABNORMAL HIGH (ref 0.0–1.0)
Neutrophils %: 80 % — ABNORMAL HIGH (ref 32–75)
Neutrophils Absolute: 24.9 10*3/uL — ABNORMAL HIGH (ref 1.8–8.0)
Nucleated RBCs: 0 PER 100 WBC
Platelets: 555 10*3/uL — ABNORMAL HIGH (ref 150–400)
RBC: 2.76 M/uL — ABNORMAL LOW (ref 4.10–5.70)
RDW: 15.2 % — ABNORMAL HIGH (ref 11.5–14.5)
WBC: 31 10*3/uL — ABNORMAL HIGH (ref 4.1–11.1)
nRBC: 0 10*3/uL (ref 0.00–0.01)

## 2021-07-25 LAB — EKG 12-LEAD
Atrial Rate: 100 {beats}/min
Diagnosis: NORMAL
P Axis: 65 degrees
P-R Interval: 136 ms
Q-T Interval: 360 ms
QRS Duration: 108 ms
QTc Calculation (Bazett): 464 ms
R Axis: 21 degrees
T Axis: -3 degrees
Ventricular Rate: 100 {beats}/min

## 2021-07-25 LAB — LACTIC ACID: Lactic Acid, Plasma: 0.7 mmol/L (ref 0.4–2.0)

## 2021-07-25 MED ORDER — VANCOMYCIN INTERMITTENT DOSING (PLACEHOLDER)
Freq: Once | INTRAVENOUS | Status: AC
Start: 2021-07-25 — End: 2021-07-26

## 2021-07-25 MED ORDER — TRAZODONE HCL 50 MG PO TABS
50 MG | Freq: Once | ORAL | Status: AC
Start: 2021-07-25 — End: 2021-07-24
  Administered 2021-07-25: 04:00:00 50 mg via ORAL

## 2021-07-25 MED ORDER — TRAMADOL HCL 50 MG PO TABS
50 | Freq: Four times a day (QID) | ORAL | Status: DC | PRN
Start: 2021-07-25 — End: 2021-07-27
  Administered 2021-07-25 – 2021-07-27 (×12): 50 mg via ORAL

## 2021-07-25 MED FILL — VANCOMYCIN INTERMITTENT DOSING (PLACEHOLDER): INTRAVENOUS | Qty: 1

## 2021-07-25 MED FILL — PROMETHAZINE HCL 6.25 MG/5ML PO SYRP: 6.25 MG/5ML | ORAL | Qty: 5

## 2021-07-25 MED FILL — SODIUM BICARBONATE 8.4 % IV SOLN: 8.4 % | INTRAVENOUS | Qty: 50

## 2021-07-25 NOTE — Plan of Care (Signed)
Problem: Discharge Planning  Goal: Discharge to home or other facility with appropriate resources  Outcome: Progressing  Flowsheets  Taken 07/25/2021 1950 by Nancy Nordmann, RN  Discharge to home or other facility with appropriate resources:   Identify barriers to discharge with patient and caregiver   Identify discharge learning needs (meds, wound care, etc)   Arrange for interpreters to assist at discharge as needed  Taken 07/25/2021 1528 by Leeann Must, LPN  Discharge to home or other facility with appropriate resources: Identify barriers to discharge with patient and caregiver     Problem: Pain  Goal: Verbalizes/displays adequate comfort level or baseline comfort level  Outcome: Progressing     Problem: Skin/Tissue Integrity  Goal: Absence of new skin breakdown  Description: 1.  Monitor for areas of redness and/or skin breakdown  2.  Assess vascular access sites hourly  3.  Every 4-6 hours minimum:  Change oxygen saturation probe site  4.  Every 4-6 hours:  If on nasal continuous positive airway pressure, respiratory therapy assess nares and determine need for appliance change or resting period.  Outcome: Progressing     Problem: Safety - Adult  Goal: Free from fall injury  Outcome: Progressing     Problem: Chronic Conditions and Co-morbidities  Goal: Patient's chronic conditions and co-morbidity symptoms are monitored and maintained or improved  Outcome: Progressing  Flowsheets  Taken 07/25/2021 1950 by Nancy Nordmann, RN  Care Plan - Patient's Chronic Conditions and Co-Morbidity Symptoms are Monitored and Maintained or Improved:   Monitor and assess patient's chronic conditions and comorbid symptoms for stability, deterioration, or improvement   Collaborate with multidisciplinary team to address chronic and comorbid conditions and prevent exacerbation or deterioration   Update acute care plan with appropriate goals if chronic or comorbid symptoms are exacerbated and prevent overall improvement and  discharge  Taken 07/25/2021 1528 by Leeann Must, LPN  Care Plan - Patient's Chronic Conditions and Co-Morbidity Symptoms are Monitored and Maintained or Improved: Collaborate with multidisciplinary team to address chronic and comorbid conditions and prevent exacerbation or deterioration     Problem: Respiratory - Adult  Goal: Achieves optimal ventilation and oxygenation  Outcome: Progressing  Flowsheets  Taken 07/25/2021 1950 by Nancy Nordmann, RN  Achieves optimal ventilation and oxygenation: Assess for changes in respiratory status  Taken 07/25/2021 1528 by Leeann Must, LPN  Achieves optimal ventilation and oxygenation: Assess for changes in respiratory status     Problem: Cardiovascular - Adult  Goal: Maintains optimal cardiac output and hemodynamic stability  Outcome: Progressing  Flowsheets  Taken 07/25/2021 1950 by Nancy Nordmann, RN  Maintains optimal cardiac output and hemodynamic stability: Monitor blood pressure and heart rate  Taken 07/25/2021 1528 by Leeann Must, LPN  Maintains optimal cardiac output and hemodynamic stability: Monitor blood pressure and heart rate  Goal: Absence of cardiac dysrhythmias or at baseline  Outcome: Progressing  Flowsheets  Taken 07/25/2021 1950 by Nancy Nordmann, RN  Absence of cardiac dysrhythmias or at baseline: Monitor cardiac rate and rhythm  Taken 07/25/2021 1528 by Leeann Must, LPN  Absence of cardiac dysrhythmias or at baseline: Monitor cardiac rate and rhythm     Problem: Infection - Adult  Goal: Absence of infection at discharge  Outcome: Progressing  Flowsheets  Taken 07/25/2021 1950 by Nancy Nordmann, RN  Absence of infection at discharge: Assess and monitor for signs and symptoms of infection  Taken 07/25/2021 1528 by Leeann Must, LPN  Absence of infection at discharge: Assess and monitor for signs and symptoms  of infection  Goal: Absence of infection during hospitalization  Outcome: Progressing  Flowsheets  Taken 07/25/2021 1950 by Nancy Nordmann, RN  Absence of  infection during hospitalization: Assess and monitor for signs and symptoms of infection  Taken 07/25/2021 1528 by Leeann Must, LPN  Absence of infection during hospitalization: Assess and monitor for signs and symptoms of infection  Goal: Absence of fever/infection during anticipated neutropenic period  Outcome: Progressing  Flowsheets (Taken 07/25/2021 1950)  Absence of fever/infection during anticipated neutropenic period: Monitor white blood cell count     Problem: Metabolic/Fluid and Electrolytes - Adult  Goal: Electrolytes maintained within normal limits  Outcome: Progressing  Flowsheets  Taken 07/25/2021 1950 by Nancy Nordmann, RN  Electrolytes maintained within normal limits:   Monitor labs and assess patient for signs and symptoms of electrolyte imbalances   Monitor response to electrolyte replacements, including repeat lab results as appropriate  Taken 07/25/2021 1528 by Leeann Must, LPN  Electrolytes maintained within normal limits: Monitor labs and assess patient for signs and symptoms of electrolyte imbalances  Goal: Hemodynamic stability and optimal renal function maintained  Outcome: Progressing  Flowsheets  Taken 07/25/2021 1950 by Nancy Nordmann, RN  Hemodynamic stability and optimal renal function maintained:   Monitor labs and assess for signs and symptoms of volume excess or deficit   Monitor intake, output and patient weight   Monitor response to interventions for patient's volume status, including labs, urine output, blood pressure (other measures as available)   Monitor urine specific gravity, serum osmolarity and serum sodium as indicated or ordered  Taken 07/25/2021 1528 by Leeann Must, LPN  Hemodynamic stability and optimal renal function maintained: Monitor labs and assess for signs and symptoms of volume excess or deficit  Goal: Glucose maintained within prescribed range  Outcome: Progressing  Flowsheets  Taken 07/25/2021 1950 by Nancy Nordmann, RN  Glucose maintained within prescribed  range:   Monitor blood glucose as ordered   Administer ordered medications to maintain glucose within target range   Assess barriers to adequate nutritional intake and initiate nutrition consult as needed  Taken 07/25/2021 1528 by Leeann Must, LPN  Glucose maintained within prescribed range: Assess barriers to adequate nutritional intake and initiate nutrition consult as needed     Problem: Gastrointestinal - Adult  Goal: Maintains or returns to baseline bowel function  Outcome: Progressing  Flowsheets  Taken 07/25/2021 1950 by Nancy Nordmann, RN  Maintains or returns to baseline bowel function: Assess bowel function  Taken 07/25/2021 1528 by Leeann Must, LPN  Maintains or returns to baseline bowel function: Assess bowel function  Goal: Maintains adequate nutritional intake  Outcome: Progressing  Flowsheets  Taken 07/25/2021 1950 by Nancy Nordmann, RN  Maintains adequate nutritional intake: Assist with meals as needed  Taken 07/25/2021 1528 by Leeann Must, LPN  Maintains adequate nutritional intake: Assist with meals as needed     Problem: Genitourinary - Adult  Goal: Absence of urinary retention  Outcome: Progressing  Flowsheets  Taken 07/25/2021 1950 by Nancy Nordmann, RN  Absence of urinary retention: Monitor intake/output and perform bladder scan as needed  Taken 07/25/2021 1528 by Leeann Must, LPN  Absence of urinary retention: Monitor intake/output and perform bladder scan as needed

## 2021-07-25 NOTE — Progress Notes (Signed)
Pulmonary progress note  Mr. Pavel Gadd is a 41 years old Caucasian male who is known to have history of Crohn's disease, history of intravenous drug use.  He usually follows with [facility] at [facility] for his Crohn's disease.  Patient is admitted with 2 weeks history of not feeling well, right-sided pleuritic chest pain and generalized weakness.  Later on he started running fever with cough productive of scanty amount of creamy colored sputum.  Progressively got worse, ultimately came to the ER.  Chest x-ray was done in his initial work-up which showed right lung infiltrates.  He was also noted to have pleural effusion.  His initial WBC count was 36,000.  Patient was also noted to have elevated BUN and creatinine of 53 and 2.32.  Patient is admitted for sepsis with large loculated right pleural effusion and pneumonia.  Patient noted that his last intravenous drug use of fentanyl was about 2 weeks ago  Subjective:     Patient examined at bedside  He had pleural drain placed yesterday, about 450 cc of tan-colored fluid is drained.  WBC count has dropped a little.  Denies any fever.  Tachypnea has shown improvement.  We will fluid analysis revealed exudative effusion.  Renal functions have improved with hydration      Patient Active Problem List   Diagnosis    Sepsis (HCC)    Renal failure    Metabolic acidosis    Normocytic anemia    Acute respiratory failure with hypoxia (HCC)    Severe protein-calorie malnutrition (HCC)     Past Medical History:   Diagnosis Date    Crohn's disease (HCC)     IV drug abuse (HCC)       History reviewed. No pertinent family history.   Social History     Tobacco Use    Smoking status: Some Days     Types: Cigarettes    Smokeless tobacco: Never   Substance Use Topics    Alcohol use: Not Currently     History reviewed. No pertinent surgical history.   Prior to Admission medications    Medication Sig Start Date End Date Taking? Authorizing Provider    lamoTRIgine (LAMICTAL) 100 MG tablet Take by mouth daily   Yes Historical Provider, MD   adalimumab (HUMIRA) 40 MG/0.8ML injection Inject 0.8 mLs into the skin every 14 days   Yes Historical Provider, MD   mercaptopurine (PURINETHOL) 50 MG chemo tablet Take 1.5 tablets by mouth every evening   Yes Historical Provider, MD   promethazine (PHENERGAN) 25 MG tablet Take 1 tablet by mouth every 6 hours as needed for Nausea   Yes Historical Provider, MD     Allergies   Allergen Reactions    Sulfa Antibiotics Rash        Review of Systems:  A comprehensive review of systems was negative except for that written in the History of Present Illness.    Labs:    Recent Labs     07/23/21  1523 07/24/21  0500 07/25/21  0554   WBC 36.0* 32.0* 31.0*   HGB 9.5* 8.7* 8.3*   HCT 26.7* 24.5* 23.5*   PLT 554* 645* 555*     Recent Labs     07/23/21  1523 07/24/21  0500 07/25/21  0554   NA 138 143 140   K 3.7 3.0* 3.8   CL 102 116* 113*   CO2 18* 18* 21   GLUCOSE 80 94 88  BUN 53* 35* 21*   CREATININE 2.32* 1.36* 0.94   CALCIUM 9.0 8.4* 7.9*   PHOS  --  6.4*  --    BILITOT 0.5 0.8  --    AST 74* 50*  --    ALT 45 40  --    INR  --  1.5*  --      No results for input(s): PHART, PCO2ART, PO2ART, HCO3ART, BEART, TCO2ARRT, HGBART, PO2CORART, FIO2A, O2SATART, OXYHEM, CARBOXHGBART, METHGBART, O2CONTART, PHCORART, TEMP in the last 72 hours.    Invalid input(s): PCO2COART      Objective:   Blood pressure 111/68, pulse 89, temperature 98.4 F (36.9 C), temperature source Oral, resp. rate 21, height 1.803 m (5\' 11" ), weight 68 kg (150 lb), SpO2 94 %.  Temp (24hrs), Avg:98.3 F (36.8 C), Min:97.2 F (36.2 C), Max:98.8 F (37.1 C)    XR CHEST PORTABLE   Final Result      Right-sided pleural drain in place. No pneumothorax. There is ongoing moderate   to large right-sided effusion.            IR GUIDED THORACENTESIS PLEURAL   Final Result   Successful ultrasound guided right chest tube placement. There is   extensive loculation in the right-sided  pleural effusion.               CT CHEST ABDOMEN PELVIS WO CONTRAST Additional Contrast? None   Final Result   1.  Severe right lower lobe pneumonia with large loculated right pleural   effusion.   2.  Reactive mediastinal lymphadenopathy.   3.  Inflamed distal small bowel. Correlate for inflammatory bowel disease.   4.  No bowel obstruction.         XR CHEST PORTABLE   Final Result      Large right pleural effusion with associated airspace disease, not better   characterized.         Data Review:   Current Facility-Administered Medications   Medication Dose Route Frequency    promethazine (PHENERGAN) 6.25 MG/5ML syrup 6.25 mg  6.25 mg Oral Q4H PRN    lidocaine 4 % external patch 1 patch  1 patch TransDERmal Daily    vancomycin (VANCOCIN) 1,000 mg in sodium chloride 0.9 % 250 mL IVPB (Vial2Bag)  1,000 mg IntraVENous Q12H    [START ON 07/26/2021] Vanc Level on 7/19@0500    Other Once    traMADol (ULTRAM) tablet 50 mg  50 mg Oral Q6H PRN    sodium bicarbonate 50 mEq in sodium chloride 0.45 % 1,000 mL infusion   IntraVENous Continuous    lamoTRIgine (LAMICTAL) tablet 200 mg  200 mg Oral Daily    sodium chloride flush 0.9 % injection 5-40 mL  5-40 mL IntraVENous 2 times per day    sodium chloride flush 0.9 % injection 5-40 mL  5-40 mL IntraVENous PRN    0.9 % sodium chloride infusion   IntraVENous PRN    enoxaparin (LOVENOX) injection 40 mg  40 mg SubCUTAneous Daily    ondansetron (ZOFRAN-ODT) disintegrating tablet 4 mg  4 mg Oral Q8H PRN    Or    ondansetron (ZOFRAN) injection 4 mg  4 mg IntraVENous Q6H PRN    polyethylene glycol (GLYCOLAX) packet 17 g  17 g Oral Daily PRN    acetaminophen (TYLENOL) tablet 650 mg  650 mg Oral Q6H PRN    Or    acetaminophen (TYLENOL) suppository 650 mg  650 mg Rectal Q6H PRN    potassium  chloride 10 mEq/100 mL IVPB (Peripheral Line)  10 mEq IntraVENous PRN    magnesium sulfate 2000 mg in 50 mL IVPB premix  2,000 mg IntraVENous PRN    furosemide (LASIX) injection 40 mg  40 mg IntraVENous  Once    vancomycin (VANCOCIN) intermittent dosing (placeholder)   Other RX Placeholder    piperacillin-tazobactam (ZOSYN) 3,375 mg in sodium chloride 0.9 % 50 mL IVPB (mini-bag)  3,375 mg IntraVENous q8h    melatonin tablet 5 mg  5 mg Oral Nightly PRN        Exam:      Patient is currently alert and responding appropriately.  Mildly tachypneic.  Head normocephalic and atraumatic, pupils are round sponsored light sclera icteric and conjunctive are pink  Neck is supple.  No cervical lymphadenopathy.  Thyroid not enlarged  Chest: Has rhonchi audible right lung base along with decreased right-sided air entry at base.  No chest wall tenderness noted.  No wheezing was appreciated.    Abdomen: Soft, nontender, no visceromegaly  Extremities: No edema, sinus or clubbing  Neuro: No focal motor deficit    Impression:   This is middle aged male with known history of Crohn's disease, IV drug use who has been feeling sick for the last 2 weeks with generalized weakness, tiredness and fatigue, low-grade fever along with cough.  Noted to have elevated WBC count of 36,000 on admission, chest x-ray and CT scan of chest showing moderate to large right loculated pleural effusion along with right basal infiltrate.  Patient also has elevated BUN and creatinine.    Plan:   1.  Hypoxic respiratory failure:  Secondary to moderate to large loculated right pleural effusion along with dense right lower lobe pneumonia.  It has shown improvement after right thoracentesis and pleural drain  We will try him on room air.    2.  Severe sepsis:  Patient has severe sepsis with elevated WBC count, acute renal failure, tachycardia and tachypnea.  Lactic acid level is normal.  WBC count has trickled down. Started on IV Zosyn and vancomycin  3.  Loculated right pleural effusion:  Patient has loculated right pleural effusion, seems to be parapneumonic effusion.  Underwent drain placement along with drainage of right pleural effusion.  Patient has 450 cc of  tan-colored fluid in the drain bottle.  Pleural fluid studies revealed LDH 3817, protein 4.7 g  TriGlyceride 41  Its an exudative effusion   No Growth noted.    Cytology results pending  Continue IV Zosyn, IV vancomycin.  Patient was initially given a dose of Rocephin and Zithromax in the ER.   check serial CBC.  4. IV drug use  he is counseled to quit IV drug use  Urine drug screen negative  5.  Crohn's disease:  Patient is immunosuppressed from Crohn's disease.  6.  Acute renal failure:  Secondary to volume contraction, sepsis  Has shown improvement.    Management is discussed with patient and covering nurse  More than 45 minutes were spent in patient's evaluation, review of lab data, imaging studies and decision making    .  A. Thompson Grayer, MD  [facility]     This dictation was done by dragon, computer voice recognition software.  Often unanticipated grammatical, syntax, Homer phones and other interpretive errors are inadvertently transcribed.  Please excuse errors that have escaped final proofreading

## 2021-07-25 NOTE — Progress Notes (Signed)
Hospitalist Progress Note    Daily Progress Note: 07/25/2021 9:01 AM  CC: DOE    Assessment/Plan with MDM & Differential Considerations     # Sepsis d/t Loculated Right Pleural Parapneumonic Effusion, now with Chest Drain/Tube by IR  # Acute Hypoxic Resp Failure d/t above  # AKI d/t Sepsis  # Metabolic Acidosis  # Severe Protein-caloric Malnutrition    Consultants Pulm/[place], IR    DVT Prophylaxis: On AC/Lovenox  Code Status: Full Code     MDM/Plan & Disposition:  - Discussed with ICU nursing team: transfer to floor  - Continue IV Vancomycin with dosing monitoring/adjustment  - Continue IV Zosyn  - Continue Chest Tube monitoring  - Repeat labs  - Total Time: 50 mins    Admission Hx/HPI:  41 yr old male with a hx of crohns dz and IVDU who presented to the ED with a cc of progressive DOE and weakness.  Symptoms were progressively worse over the past several days and ultimately presented to the ED when he developed a pleuritic Rt flank pain that radiated into his abd.  PT was febrile and hypoxic on arrival but remained hemodynamically stable. A severe RLL infiltrate and large secondary pleural effusion was noted on CT and acute renal failure present on initial lab work and radiographic studies. Concern for HAP and parapneumonia effusion was raised.  ----------------------------------------    Subjective: Feels well. Denies CP, SOB, N/V.      Current Facility-Administered Medications   Medication Dose Route Frequency    promethazine (PHENERGAN) 6.25 MG/5ML syrup 6.25 mg  6.25 mg Oral Q4H PRN    lidocaine 4 % external patch 1 patch  1 patch TransDERmal Daily    vancomycin (VANCOCIN) 1,000 mg in sodium chloride 0.9 % 250 mL IVPB (Vial2Bag)  1,000 mg IntraVENous Q12H    [START ON 07/26/2021] Vanc Level on 7/19@0500    Other Once    traMADol (ULTRAM) tablet 50 mg  50 mg Oral Q6H PRN    sodium bicarbonate 50 mEq in sodium chloride 0.45 % 1,000 mL infusion   IntraVENous Continuous    lamoTRIgine (LAMICTAL) tablet 200 mg  200  mg Oral Daily    sodium chloride flush 0.9 % injection 5-40 mL  5-40 mL IntraVENous 2 times per day    sodium chloride flush 0.9 % injection 5-40 mL  5-40 mL IntraVENous PRN    0.9 % sodium chloride infusion   IntraVENous PRN    enoxaparin (LOVENOX) injection 40 mg  40 mg SubCUTAneous Daily    ondansetron (ZOFRAN-ODT) disintegrating tablet 4 mg  4 mg Oral Q8H PRN    Or    ondansetron (ZOFRAN) injection 4 mg  4 mg IntraVENous Q6H PRN    polyethylene glycol (GLYCOLAX) packet 17 g  17 g Oral Daily PRN    acetaminophen (TYLENOL) tablet 650 mg  650 mg Oral Q6H PRN    Or    acetaminophen (TYLENOL) suppository 650 mg  650 mg Rectal Q6H PRN    potassium chloride 10 mEq/100 mL IVPB (Peripheral Line)  10 mEq IntraVENous PRN    magnesium sulfate 2000 mg in 50 mL IVPB premix  2,000 mg IntraVENous PRN    furosemide (LASIX) injection 40 mg  40 mg IntraVENous Once    vancomycin (VANCOCIN) intermittent dosing (placeholder)   Other RX Placeholder    piperacillin-tazobactam (ZOSYN) 3,375 mg in sodium chloride 0.9 % 50 mL IVPB (mini-bag)  3,375 mg IntraVENous q8h    melatonin tablet 5 mg  5 mg Oral Nightly PRN       Objective     BP 111/68   Pulse 89   Temp 98.4 F (36.9 C) (Oral)   Resp 21   Ht 1.803 m (5\' 11" )   Wt 68 kg (150 lb)   SpO2 94%   BMI 20.92 kg/m  O2 Flow Rate (L/min): 1 L/min (Decreased per SpO2) O2 Device: Nasal cannula    Temp (24hrs), Avg:98.3 F (36.8 C), Min:97.2 F (36.2 C), Max:98.8 F (37.1 C)      No intake/output data recorded.  07/16 1901 - 07/18 0700  In: 650.2   Out: 3245 [Urine:2825]      PERTINENT PHYSICAL EXAM    Constitutional: NAD, Awake, conversant, comfortable    Neck: Supple     Cardiovascular: S1S2, no murmur; Tele: sinus    Respiratory: Good bilat breath sounds; right chest tube     GI: Soft, NT; no guarding/rigidity; + bowel sounds    Neurological:  AxOx3; No new focal weakness; No overt tremors    Psychiatric: Appropriate mood; oriented, coherent/cogent    Skin: No new rash or  significant discoloration     Vascular: Palpable pulses; No edema      Data Review    Recent Results (from the past 24 hour(s))   pH, Body Fluid    Collection Time: 07/24/21 11:00 AM   Result Value Ref Range    pH, Fluid 8.0     Cell Count with Differential, Body Fluid    Collection Time: 07/24/21 11:00 AM   Result Value Ref Range    Specimen Pleural fluid      Color, Fluid Straw      Appearance, Fluid Turbid      RBC, Fluid >100 (H) 0 /cu mm    Total Nucleated Cell Count 67,022 /cu mm    Polys, Fluid 61 (A) No reference range has been established. %    Bands, Fluid 0 %    Lymphocytes, Body Fluid 17 (A) No reference range has been established. %    Monocyte Count, Fluid 21 %    Eos, Fluid 1 (A) No reference range has been established. %    Pathologist Review BACTERIA PRESENT. PATHOLOGIST'S REVIEW REQUESTED.    Protein, Body Fluid    Collection Time: 07/24/21 11:00 AM   Result Value Ref Range    Total Protein, Body Fluid 4.7 g/dL    Fluid Type Body Fluid     Triglyceride, Body Fluid    Collection Time: 07/24/21 11:00 AM   Result Value Ref Range    Fluid Type BODY FLUID     Triglycerides, Fluid 41 mg/dL   Culture, Body Fluid    Collection Time: 07/24/21 11:00 AM    Specimen: Body Fluid   Result Value Ref Range    Special Requests No Special Requests      Gram stain 4+ WBCs seen      Gram stain 3+ Gram pos cocci in chains      Culture PENDING    Lactate Dehydrogenase, Body Fluid    Collection Time: 07/24/21 11:00 AM   Result Value Ref Range    Fluid Type BODY FLUID     LD, Fluid 3,817 U/L   C-Reactive Protein    Collection Time: 07/24/21 11:38 AM   Result Value Ref Range    CRP 28.90 (H) 0.00 - 0.60 mg/dL   Lactate Dehydrogenase    Collection Time: 07/24/21 11:38 AM   Result Value Ref  Range    LD 162 85 - 241 U/L   Procalcitonin    Collection Time: 07/24/21 11:38 AM   Result Value Ref Range    Procalcitonin 3.20 (H) 0 ng/mL   Transthoracic echocardiogram (TTE) complete with contrast, bubble, strain, and 3D PRN     Collection Time: 07/24/21  3:00 PM   Result Value Ref Range    LV EDV A2C 104 mL    LV EDV A4C 78 mL    LV ESV A2C 37 mL    LV ESV A4C 20 mL    IVSd 0.9 0.6 - 1.0 cm    LVIDd 4.9 4.2 - 5.9 cm    LVIDs 2.4 cm    LVOT Diameter 2.1 cm    LVOT Mean Gradient 2 mmHg    LVOT VTI 15.9 cm    LVOT Peak Velocity 0.9 m/s    LVOT Peak Gradient 3 mmHg    LVPWd 1.3 (A) 0.6 - 1.0 cm    LV E' Lateral Velocity 24 cm/s    LV E' Septal Velocity 15 cm/s    LV Ejection Fraction A2C 65 %    LV Ejection Fraction A4C 74 %    LVOT Area 3.5 cm2    LVOT SV 55.0 ml    LA Minor Axis 4.8 cm    LA Major Axis 5.3 cm    LA Area 2C 11.5 cm2    LA Area 4C 19.2 cm2    LA Volume 2C 24 18 - 58 mL    LA Volume 4C 55 18 - 58 mL    LA Volume BP 35 18 - 58 mL    RA Area 4C 11.5 cm2    RA Volume 22 ml    AV Mean Gradient 4 mmHg    AV VTI 22.6 cm    AV Mean Velocity 0.8 m/s    AV Peak Velocity 1.6 m/s    AV Peak Gradient 10 mmHg    AV Area by VTI 2.4 cm2    AV Area by Peak Velocity 1.9 cm2    Aortic Root 2.1 cm    MR Peak Velocity 1.3 m/s    MR Peak Gradient 7 mmHg    MV A Velocity 0.70 m/s    MV E Velocity 0.92 m/s    PV Max Velocity 1.3 m/s    PV Peak Gradient 7 mmHg    RV Basal Dimension 4.6 cm    RV Mid Dimension 3.4 cm    RV Free Wall Peak S' 31 cm/s    TAPSE 2.5 1.7 cm    TR Max Velocity 2.35 m/s    TR Peak Gradient 22 mmHg    Body Surface Area 1.85 m2    Fractional Shortening 2D 51 28 - 44 %    LV ESV Index A4C 11 mL/m2    LV EDV Index A4C 42 mL/m2    LV ESV Index A2C 20 mL/m2    LV EDV Index A2C 56 mL/m2    LVIDd Index 2.62 cm/m2    LVIDs Index 1.28 cm/m2    LV RWT Ratio 0.53     LV Mass 2D 200.5 88 - 224 g    LV Mass 2D Index 107.2 49 - 115 g/m2    MV E/A 1.31     E/E' Ratio (Averaged) 4.98     E/E' Lateral 3.83     E/E' Septal 6.13     LA Volume Index BP 19 16 - 34  ml/m2    LVOT Stroke Volume Index 29.4 mL/m2    LA Volume Index 2C 13 (A) 16 - 34 mL/m2    LA Volume Index 4C 29 16 - 34 mL/m2    RA Volume Index A4C 12 mL/m2    Ao Root Index 1.12 cm/m2     AV Velocity Ratio 0.56     LVOT:AV VTI Index 0.70     AVA/BSA VTI 1.3 cm2/m2    AVA/BSA Peak Velocity 1.0 cm2/m2    Est. RA Pressure 8 mmHg    RVSP 30 mmHg   Vancomycin Level, Random    Collection Time: 07/24/21  5:00 PM   Result Value Ref Range    Vancomycin Rm 5.4 ug/mL   Basic Metabolic Panel    Collection Time: 07/25/21  5:54 AM   Result Value Ref Range    Sodium 140 136 - 145 mmol/L    Potassium 3.8 3.5 - 5.1 mmol/L    Chloride 113 (H) 97 - 108 mmol/L    CO2 21 21 - 32 mmol/L    Anion Gap 6 5 - 15 mmol/L    Glucose 88 65 - 100 mg/dL    BUN 21 (H) 6 - 20 mg/dL    Creatinine 8.84 1.66 - 1.30 mg/dL    Bun/Cre Ratio 22 (H) 12 - 20      Est, Glom Filt Rate >60 >60 ml/min/1.10m2    Calcium 7.9 (L) 8.5 - 10.1 mg/dL   CBC with Auto Differential    Collection Time: 07/25/21  5:54 AM   Result Value Ref Range    WBC 31.0 (H) 4.1 - 11.1 K/uL    RBC 2.76 (L) 4.10 - 5.70 M/uL    Hemoglobin 8.3 (L) 12.1 - 17.0 g/dL    Hematocrit 06.3 (L) 36.6 - 50.3 %    MCV 85.1 80.0 - 99.0 FL    MCH 30.1 26.0 - 34.0 PG    MCHC 35.3 30.0 - 36.5 g/dL    RDW 01.6 (H) 01.0 - 14.5 %    Platelets 555 (H) 150 - 400 K/uL    MPV 10.6 8.9 - 12.9 FL    Nucleated RBCs 0.0 0.0 PER 100 WBC    nRBC 0.00 0.00 - 0.01 K/uL    Neutrophils % 80 (H) 32 - 75 %    Lymphocytes % 5 (L) 12 - 49 %    Monocytes % 10 5 - 13 %    Eosinophils % 1 0 - 7 %    Basophils % 0 0 - 1 %    Immature Granulocytes 4 (H) 0 - 0.5 %    Neutrophils Absolute 24.9 (H) 1.8 - 8.0 K/UL    Lymphocytes Absolute 1.5 0.8 - 3.5 K/UL    Monocytes Absolute 3.0 (H) 0.0 - 1.0 K/UL    Eosinophils Absolute 0.2 0.0 - 0.4 K/UL    Basophils Absolute 0.1 0.0 - 0.1 K/UL    Absolute Immature Granulocyte 1.2 (H) 0.00 - 0.04 K/UL    Differential Type AUTOMATED     Lactic Acid    Collection Time: 07/25/21  5:54 AM   Result Value Ref Range    Lactic Acid, Plasma 0.7 0.4 - 2.0 mmol/L       XR CHEST PORTABLE   Final Result      Right-sided pleural drain in place. No pneumothorax. There is ongoing moderate   to  large right-sided effusion.  IR GUIDED THORACENTESIS PLEURAL   Final Result   Successful ultrasound guided right chest tube placement. There is   extensive loculation in the right-sided pleural effusion.               CT CHEST ABDOMEN PELVIS WO CONTRAST Additional Contrast? None   Final Result   1.  Severe right lower lobe pneumonia with large loculated right pleural   effusion.   2.  Reactive mediastinal lymphadenopathy.   3.  Inflamed distal small bowel. Correlate for inflammatory bowel disease.   4.  No bowel obstruction.         XR CHEST PORTABLE   Final Result      Large right pleural effusion with associated airspace disease, not better   characterized.          ________________________________________  Arnette Felts, MD

## 2021-07-26 ENCOUNTER — Inpatient Hospital Stay: Admit: 2021-07-26 | Payer: MEDICARE

## 2021-07-26 DIAGNOSIS — K50919 Crohn's disease, unspecified, with unspecified complications: Principal | ICD-10-CM

## 2021-07-26 LAB — CBC WITH AUTO DIFFERENTIAL
Absolute Immature Granulocyte: 0 10*3/uL
Basophils %: 1 % (ref 0–1)
Basophils Absolute: 0.2 10*3/uL — ABNORMAL HIGH (ref 0.0–0.1)
Eosinophils %: 1 % (ref 0–7)
Eosinophils Absolute: 0.2 10*3/uL (ref 0.0–0.4)
Hematocrit: 23.4 % — ABNORMAL LOW (ref 36.6–50.3)
Hemoglobin: 8.2 g/dL — ABNORMAL LOW (ref 12.1–17.0)
Immature Granulocytes: 0 %
Lymphocytes %: 7 % — ABNORMAL LOW (ref 12–49)
Lymphocytes Absolute: 1.7 10*3/uL (ref 0.8–3.5)
MCH: 30.1 PG (ref 26.0–34.0)
MCHC: 35 g/dL (ref 30.0–36.5)
MCV: 86 FL (ref 80.0–99.0)
MPV: 10.3 FL (ref 8.9–12.9)
Monocytes %: 11 % (ref 5–13)
Monocytes Absolute: 2.6 10*3/uL — ABNORMAL HIGH (ref 0.0–1.0)
Myelocytes: 2 % — ABNORMAL HIGH
Neutrophils %: 78 % — ABNORMAL HIGH (ref 32–75)
Neutrophils Absolute: 18.6 10*3/uL — ABNORMAL HIGH (ref 1.8–8.0)
Nucleated RBCs: 0 PER 100 WBC
Platelets: 508 10*3/uL — ABNORMAL HIGH (ref 150–400)
RBC: 2.72 M/uL — ABNORMAL LOW (ref 4.10–5.70)
RDW: 15.4 % — ABNORMAL HIGH (ref 11.5–14.5)
WBC: 23.8 10*3/uL — ABNORMAL HIGH (ref 4.1–11.1)
nRBC: 0 10*3/uL (ref 0.00–0.01)

## 2021-07-26 LAB — COMPREHENSIVE METABOLIC PANEL
ALT: 25 U/L (ref 12–78)
AST: 22 U/L (ref 15–37)
Albumin/Globulin Ratio: 0.4 — ABNORMAL LOW (ref 1.1–2.2)
Albumin: 1.5 g/dL — ABNORMAL LOW (ref 3.5–5.0)
Alk Phosphatase: 154 U/L — ABNORMAL HIGH (ref 45–117)
Anion Gap: 6 mmol/L (ref 5–15)
BUN: 16 mg/dL (ref 6–20)
Bun/Cre Ratio: 20 (ref 12–20)
CO2: 23 mmol/L (ref 21–32)
Calcium: 8 mg/dL — ABNORMAL LOW (ref 8.5–10.1)
Chloride: 111 mmol/L — ABNORMAL HIGH (ref 97–108)
Creatinine: 0.81 mg/dL (ref 0.70–1.30)
Est, Glom Filt Rate: >60 mL/min/{1.73_m2} (ref >60)
Globulin: 3.8 g/dL (ref 2.0–4.0)
Glucose: 82 mg/dL (ref 65–100)
Potassium: 3.4 mmol/L — ABNORMAL LOW (ref 3.5–5.1)
Sodium: 140 mmol/L (ref 136–145)
Total Bilirubin: 0.5 mg/dL (ref 0.2–1.0)
Total Protein: 5.3 g/dL — ABNORMAL LOW (ref 6.4–8.2)

## 2021-07-26 LAB — VANCOMYCIN LEVEL, RANDOM
DOSE AMOUNT: NOT DETECTED Units
Dose Date/Time: NOT DETECTED
Vancomycin Rm: 10 ug/mL

## 2021-07-26 LAB — HIV-1,2 AB CONFIRM ONLY, BY MULTISPOT
HIV-1 Abs-EIA: NONREACTIVE
HIV-2 Abs- EIA: NONREACTIVE
NOTE: NEGATIVE

## 2021-07-26 LAB — EKG 12-LEAD
Atrial Rate: 85 {beats}/min
Diagnosis: NORMAL
P Axis: 35 degrees
P-R Interval: 140 ms
Q-T Interval: 370 ms
QRS Duration: 104 ms
QTc Calculation (Bazett): 440 ms
R Axis: 36 degrees
T Axis: 9 degrees
Ventricular Rate: 85 {beats}/min

## 2021-07-26 LAB — MAGNESIUM: Magnesium: 1.9 mg/dL (ref 1.6–2.4)

## 2021-07-26 LAB — PROCALCITONIN: Procalcitonin: 1.38 ng/mL — ABNORMAL HIGH

## 2021-07-26 LAB — C-REACTIVE PROTEIN: CRP: 17.5 mg/dL — ABNORMAL HIGH (ref 0.00–0.60)

## 2021-07-26 MED ORDER — ACETAMINOPHEN 650 MG RE SUPP
650 | RECTAL | Status: DC | PRN
Start: 2021-07-26 — End: 2021-07-26

## 2021-07-26 MED ORDER — SODIUM CHLORIDE 0.9 % IV BOLUS
0.9 | Freq: Once | INTRAVENOUS | Status: AC
Start: 2021-07-26 — End: 2021-07-25
  Administered 2021-07-26: 02:00:00 500 mL via INTRAVENOUS

## 2021-07-26 MED ORDER — GUAIFENESIN ER 600 MG PO TB12
600 MG | ORAL_TABLET | Freq: Two times a day (BID) | ORAL | 0 refills | Status: AC
Start: 2021-07-26 — End: ?

## 2021-07-26 MED ORDER — VANCOMYCIN INTERMITTENT DOSING (PLACEHOLDER)
Freq: Once | INTRAVENOUS | Status: DC
Start: 2021-07-26 — End: 2021-07-26

## 2021-07-26 MED ORDER — GUAIFENESIN ER 600 MG PO TB12
600 | Freq: Four times a day (QID) | ORAL | Status: DC
Start: 2021-07-26 — End: 2021-07-26

## 2021-07-26 MED ORDER — LIDOCAINE 4 % EX PTCH
4 % | MEDICATED_PATCH | Freq: Every day | CUTANEOUS | 0 refills | Status: AC
Start: 2021-07-26 — End: ?

## 2021-07-26 MED ORDER — ACETAMINOPHEN 325 MG PO TABS
325 | Freq: Four times a day (QID) | ORAL | Status: DC | PRN
Start: 2021-07-26 — End: 2021-07-26
  Administered 2021-07-26: 01:00:00 650 mg via ORAL

## 2021-07-26 MED ORDER — GUAIFENESIN ER 600 MG PO TB12
600 | Freq: Two times a day (BID) | ORAL | Status: DC
Start: 2021-07-26 — End: 2021-07-27
  Administered 2021-07-26 – 2021-07-27 (×3): 600 mg via ORAL

## 2021-07-26 MED ORDER — ACETAMINOPHEN 650 MG RE SUPP
650 | RECTAL | Status: DC | PRN
Start: 2021-07-26 — End: 2021-07-27

## 2021-07-26 MED ORDER — ACETAMINOPHEN 325 MG PO TABS
325 | ORAL | Status: DC | PRN
Start: 2021-07-26 — End: 2021-07-27
  Administered 2021-07-26 – 2021-07-27 (×8): 650 mg via ORAL

## 2021-07-26 MED ORDER — VANCOMYCIN HCL 1 G IV SOLR
1 | Freq: Three times a day (TID) | INTRAVENOUS | Status: DC
Start: 2021-07-26 — End: 2021-07-26
  Administered 2021-07-26: 21:00:00 1000 mg via INTRAVENOUS

## 2021-07-26 MED ORDER — ALPRAZOLAM 0.25 MG PO TABS
0.25 | Freq: Three times a day (TID) | ORAL | Status: DC | PRN
Start: 2021-07-26 — End: 2021-07-27
  Administered 2021-07-26 – 2021-07-27 (×4): 0.25 mg via ORAL

## 2021-07-26 MED ORDER — HUMIRA PEN CITRATE FREE 40 MG/0.4 ML
SUBCUTANEOUS | 0 refills | 84 days | Status: CP
Start: 2021-07-26 — End: ?
  Filled 2021-07-25: qty 2, 28d supply, fill #2

## 2021-07-26 MED FILL — SODIUM CHLORIDE 0.9 % IV SOLN: 0.9 % | INTRAVENOUS | Qty: 500

## 2021-07-26 MED FILL — VANCOMYCIN INTERMITTENT DOSING (PLACEHOLDER): INTRAVENOUS | Qty: 1

## 2021-07-26 NOTE — Unmapped (Signed)
Mom Dakota Townsend ([id number]) called with concerns due to patient's acute hospitalization in [place], [place]. Started with upper flank pain, dx with pneumonia in ICU over the weekend, febrile, chest tubes placed, now being transferred to alternative hospital for surgical intervention. Told her to keep [place] posted and providers there can call my number if any assist from our office is needed.  Told her he should hold humira and for now. Dr. Stevphen Rochester updated.

## 2021-07-26 NOTE — Progress Notes (Signed)
Pulmonary progress note  [name] is a 41 years old Caucasian male who is known to have history of Crohn's disease, history of intravenous drug use.  He usually follows with [facility] at [facility] for his Crohn's disease.  Patient is admitted with 2 weeks history of not feeling well, right-sided pleuritic chest pain and generalized weakness.  Later on he started running fever with cough productive of scanty amount of creamy colored sputum.  Progressively got worse, ultimately came to the ER.  Chest x-ray was done in his initial work-up which showed right lung infiltrates.  He was also noted to have pleural effusion.  His initial WBC count was 36,000.  Patient was also noted to have elevated BUN and creatinine of 53 and 2.32.  Patient is admitted for sepsis with large loculated right pleural effusion and pneumonia.  Patient noted that his last intravenous drug use of fentanyl was about 2 weeks ago  Subjective:     Patient examined at bedside  He had pleural drain placed yesterday, about 750 cc of tan-colored fluid is drained.  WBC count has dropped down to 23,000 now.   Denies any fever.  Has marginal appetite  Tachypnea has shown improvement.  Pleural fluid analysis revealed exudative effusion.  It is growing Streptococcus, gram-positive cocci in chains  Renal functions have improved with hydration      Patient Active Problem List   Diagnosis    Sepsis (HCC)    Renal failure    Metabolic acidosis    Normocytic anemia    Acute respiratory failure with hypoxia (HCC)    Severe protein-calorie malnutrition (HCC)     Past Medical History:   Diagnosis Date    Crohn's disease (HCC)     IV drug abuse (HCC)       History reviewed. No pertinent family history.   Social History     Tobacco Use    Smoking status: Some Days     Types: Cigarettes    Smokeless tobacco: Never   Substance Use Topics    Alcohol use: Not Currently     History reviewed. No pertinent surgical history.   Prior to  Admission medications    Medication Sig Start Date End Date Taking? Authorizing Provider   lamoTRIgine (LAMICTAL) 100 MG tablet Take by mouth daily   Yes Historical Provider, MD   adalimumab (HUMIRA) 40 MG/0.8ML injection Inject 0.8 mLs into the skin every 14 days   Yes Historical Provider, MD   mercaptopurine (PURINETHOL) 50 MG chemo tablet Take 1.5 tablets by mouth every evening   Yes Historical Provider, MD   promethazine (PHENERGAN) 25 MG tablet Take 1 tablet by mouth every 6 hours as needed for Nausea   Yes Historical Provider, MD     Allergies   Allergen Reactions    Sulfa Antibiotics Rash        Review of Systems:  A comprehensive review of systems was negative except for that written in the History of Present Illness.    Labs:    Recent Labs     07/24/21  0500 07/25/21  0554 07/26/21  0550   WBC 32.0* 31.0* 23.8*   HGB 8.7* 8.3* 8.2*   HCT 24.5* 23.5* 23.4*   PLT 645* 555* 508*     Recent Labs     07/23/21  1523 07/24/21  0500 07/25/21  0554 07/26/21  0550   NA 138 143 140 140   K 3.7 3.0* 3.8  3.4*   CL 102 116* 113* 111*   CO2 18* 18* 21 23   GLUCOSE 80 94 88 82   BUN 53* 35* 21* 16   CREATININE 2.32* 1.36* 0.94 0.81   CALCIUM 9.0 8.4* 7.9* 8.0*   MG  --   --   --  1.9   PHOS  --  6.4*  --   --    BILITOT 0.5 0.8  --  0.5   AST 74* 50*  --  22   ALT 45 40  --  25   INR  --  1.5*  --   --      No results for input(s): PHART, PCO2ART, PO2ART, HCO3ART, BEART, TCO2ARRT, HGBART, PO2CORART, FIO2A, O2SATART, OXYHEM, CARBOXHGBART, METHGBART, O2CONTART, PHCORART, TEMP in the last 72 hours.    Invalid input(s): PCO2COART      Objective:   Blood pressure 113/78, pulse 72, temperature 98.6 F (37 C), temperature source Oral, resp. rate 20, height 1.803 m (5\' 11" ), weight 68 kg (150 lb), SpO2 97 %.  Temp (24hrs), Avg:100.3 F (37.9 C), Min:98.4 F (36.9 C), Max:102.4 F (39.1 C)    XR CHEST 1 VIEW   Final Result   No significant interval change.      XR CHEST 1 VIEW   Final Result   Right pleural catheter remains in  place with interval reduction in   right pleural effusion and persistent right base atelectasis.      XR CHEST PORTABLE   Final Result      Right-sided pleural drain in place. No pneumothorax. There is ongoing moderate   to large right-sided effusion.            IR GUIDED THORACENTESIS PLEURAL   Final Result   Successful ultrasound guided right chest tube placement. There is   extensive loculation in the right-sided pleural effusion.               CT CHEST ABDOMEN PELVIS WO CONTRAST Additional Contrast? None   Final Result   1.  Severe right lower lobe pneumonia with large loculated right pleural   effusion.   2.  Reactive mediastinal lymphadenopathy.   3.  Inflamed distal small bowel. Correlate for inflammatory bowel disease.   4.  No bowel obstruction.         XR CHEST PORTABLE   Final Result      Large right pleural effusion with associated airspace disease, not better   characterized.         Data Review:   Current Facility-Administered Medications   Medication Dose Route Frequency    guaiFENesin (MUCINEX) extended release tablet 600 mg  600 mg Oral BID    acetaminophen (TYLENOL) tablet 650 mg  650 mg Oral Q4H PRN    Or    acetaminophen (TYLENOL) suppository 650 mg  650 mg Rectal Q4H PRN    Vancomycin - please draw level 7/19 0600.  Thanks!   Other Once    promethazine (PHENERGAN) 6.25 MG/5ML syrup 6.25 mg  6.25 mg Oral Q4H PRN    lidocaine 4 % external patch 1 patch  1 patch TransDERmal Daily    vancomycin (VANCOCIN) 1,000 mg in sodium chloride 0.9 % 250 mL IVPB (Vial2Bag)  1,000 mg IntraVENous Q12H    traMADol (ULTRAM) tablet 50 mg  50 mg Oral Q6H PRN    lamoTRIgine (LAMICTAL) tablet 200 mg  200 mg Oral Daily    sodium chloride flush 0.9 % injection 5-40 mL  5-40 mL IntraVENous 2 times per day    sodium chloride flush 0.9 % injection 5-40 mL  5-40 mL IntraVENous PRN    0.9 % sodium chloride infusion   IntraVENous PRN    enoxaparin (LOVENOX) injection 40 mg  40 mg SubCUTAneous Daily    ondansetron (ZOFRAN-ODT)  disintegrating tablet 4 mg  4 mg Oral Q8H PRN    Or    ondansetron (ZOFRAN) injection 4 mg  4 mg IntraVENous Q6H PRN    polyethylene glycol (GLYCOLAX) packet 17 g  17 g Oral Daily PRN    potassium chloride 10 mEq/100 mL IVPB (Peripheral Line)  10 mEq IntraVENous PRN    magnesium sulfate 2000 mg in 50 mL IVPB premix  2,000 mg IntraVENous PRN    furosemide (LASIX) injection 40 mg  40 mg IntraVENous Once    vancomycin (VANCOCIN) intermittent dosing (placeholder)   Other RX Placeholder    piperacillin-tazobactam (ZOSYN) 3,375 mg in sodium chloride 0.9 % 50 mL IVPB (mini-bag)  3,375 mg IntraVENous q8h    melatonin tablet 5 mg  5 mg Oral Nightly PRN        Exam:      Patient is currently alert and responding appropriately.  Mildly tachypneic.  Head normocephalic and atraumatic, pupils are round sponsored light sclera icteric and conjunctive are pink  Neck is supple.  No cervical lymphadenopathy.  Thyroid not enlarged  Chest: Has rhonchi audible right lung base along with decreased right-sided air entry at base.  No chest wall tenderness noted.  No wheezing was appreciated.    Has right small bore pleural catheter in place  Abdomen: Soft, nontender, no visceromegaly  Extremities: No edema, sinus or clubbing  Neuro: No focal motor deficit    Impression:   This is middle aged male with known history of Crohn's disease, IV drug use who has been feeling sick for the last 2 weeks with generalized weakness, tiredness and fatigue, low-grade fever along with cough.  Noted to have elevated WBC count of 36,000 on admission, chest x-ray and CT scan of chest showing moderate to large right loculated pleural effusion along with right basal infiltrate.  Patient also has elevated BUN and creatinine.    Plan:   1.  Hypoxic respiratory failure:  Secondary to moderate to large loculated right pleural effusion along with dense right lower lobe pneumonia.  It has shown improvement after right thoracentesis and pleural drain  Patient had some  complaints of left upper chest pain, was placed on nasal cannula oxygen last night .    2.  Severe sepsis:  Patient has severe sepsis with elevated WBC count, acute renal failure, tachycardia and tachypnea.  Lactic acid level is normal.  WBC count has now come down to 23,000 . On IV Zosyn and vancomycin  3.  Loculated right pleural effusion:  Patient has loculated right pleural effusion, seems to be parapneumonic effusion.  Underwent drain placement along with drainage of right pleural effusion.  Patient has 450 cc of tan-colored fluid in the drain bottle.  Pleural fluid studies revealed LDH 3817, protein 4.7 g  TriGlyceride 41  Its an exudative effusion   No Growth noted.    Cytology results pending  Continue IV Zosyn, IV vancomycin.  Patient was initially given a dose of Rocephin and Zithromax in the ER.   check serial CBC.  Patient may need evaluation by thoracic surgery for VATS, breakdown of loculations.  4. IV drug use  he is counseled to quit  IV drug use  Urine drug screen negative  5.  Crohn's disease:  Patient is immunosuppressed from Crohn's disease.  .    Management is discussed with patient and covering nurse  More than 45 minutes were spent in patient's evaluation, review of lab data, imaging studies and decision making    .  A. Thompson Grayer, MD  [facility]     This dictation was done by dragon, computer voice recognition software.  Often unanticipated grammatical, syntax, Homer phones and other interpretive errors are inadvertently transcribed.  Please excuse errors that have escaped final proofreading

## 2021-07-26 NOTE — Discharge Summary (Signed)
Hospitalist Discharge Summary     Patient ID:    Dylan Mccormick  [id number]  41 y.o.  09-Jun-1980    Admit date: 07/23/2021    Discharge date : 07/26/2021      Final Diagnoses & Acute Mgmt:    # Sepsis d/t Large Loculated Right Pleural/Parapneumonic Effusion, now with Chest Drain/Tube by IR. S/p initial ICU mgmt.     => Cx showing Alpha Strep    => IV Vanc, Zosyn    => Per D/w Dr. Haider/Pulmonology patient will need VATS. He spoke with Dr. Arty Baumgartner (Thoracic Surgery) at [facility] who has accepted patient for VATS on Friday. I spoke with Dr. Fidel Levy via [facility] who has accepted patient for further mgmt.    # Acute Hypoxic Resp Failure d/t above; stable  # AKI d/t Sepsis; resolved  # Metabolic Acidosis; resolved  # Severe Protein-caloric Malnutrition  # Hx IVDU (fentanyl; ~2 weeks ago)  # Crohn's Disease on Immunosuppressants    SUBJECTIVE: M/s pain around chest tube but otherwise doing well. Informed patient of above procedural recommendations. Patient understood and agreed with transfer and procedure.    Reason for Hospitalization:   41 yr old male with a hx of crohns dz and IVDU who presented to the ED with a cc of progressive DOE and weakness.  Symptoms were progressively worse over the past several days and ultimately presented to the ED when he developed a pleuritic Rt flank pain that radiated into his abd.  PT was febrile and hypoxic on arrival but remained hemodynamically stable. A severe RLL infiltrate and large secondary pleural effusion was noted on CT and acute renal failure present on initial lab work and radiographic studies.    Discharge Medications       Medication List        START taking these medications      guaiFENesin 600 MG extended release tablet  Commonly known as: MUCINEX  Take 1 tablet by mouth 2 times daily     lidocaine 4 % external patch  Place 1 patch onto the skin daily  Start taking on: July 27, 2021            CONTINUE taking  these medications      adalimumab 40 MG/0.8ML injection  Commonly known as: HUMIRA     lamoTRIgine 100 MG tablet  Commonly known as: LAMICTAL     mercaptopurine 50 MG chemo tablet  Commonly known as: PURINETHOL     promethazine 25 MG tablet  Commonly known as: PHENERGAN               Where to Get Your Medications        Information about where to get these medications is not yet available    Ask your nurse or doctor about these medications  guaiFENesin 600 MG extended release tablet  lidocaine 4 % external patch       Condition at Discharge:  Stable    Disposition  Transfer to [facility]    Code Status:  Full Code     Discharge Exam:  Patient seen and examined by me on discharge day.    Constitutional: NAD, Awake, conversant, comfortable    Neck: Supple     Cardiovascular: S1S2; Tele: sinus    Respiratory:  CTA ant bilat; vesicular breath sounds, no overt wheeze     GI: Soft, NT; no guarding/rigidity; + bowel sounds    Musculoskeletal: Right chest tube  Neurological:  AxOx3; No new focal weakness; No overt tremors    Psychiatric: Appropriate mood; oriented, coherent/cogent    Skin: No new rash or significant discoloration     Vascular: Palpable pulses; No edema      CONSULTATIONS: pulmonary/intensive care, IR    Significant Diagnostic Studies:   Recent Results (from the past 24 hour(s))   Culture, Blood 1    Collection Time: 07/25/21  6:31 PM    Specimen: Blood   Result Value Ref Range    Special Requests No Special Requests      Culture No growth after 1 hour     EKG 12 Lead    Collection Time: 07/25/21 11:07 PM   Result Value Ref Range    Ventricular Rate 85 BPM    Atrial Rate 85 BPM    P-R Interval 140 ms    QRS Duration 104 ms    Q-T Interval 370 ms    QTc Calculation (Bazett) 440 ms    P Axis 35 degrees    R Axis 36 degrees    T Axis 9 degrees    Diagnosis       Normal sinus rhythm  Normal ECG  No previous ECGs available  Confirmed by DAVE MD, KAMLESH ([id number]) on 07/26/2021 10:35:13 AM     Comprehensive  Metabolic Panel    Collection Time: 07/26/21  5:50 AM   Result Value Ref Range    Sodium 140 136 - 145 mmol/L    Potassium 3.4 (L) 3.5 - 5.1 mmol/L    Chloride 111 (H) 97 - 108 mmol/L    CO2 23 21 - 32 mmol/L    Anion Gap 6 5 - 15 mmol/L    Glucose 82 65 - 100 mg/dL    BUN 16 6 - 20 mg/dL    Creatinine 0.81 0.70 - 1.30 mg/dL    Bun/Cre Ratio 20 12 - 20      Est, Glom Filt Rate >60 >60 ml/min/1.27m    Calcium 8.0 (L) 8.5 - 10.1 mg/dL    Total Bilirubin 0.5 0.2 - 1.0 mg/dL    AST 22 15 - 37 U/L    ALT 25 12 - 78 U/L    Alk Phosphatase 154 (H) 45 - 117 U/L    Total Protein 5.3 (L) 6.4 - 8.2 g/dL    Albumin 1.5 (L) 3.5 - 5.0 g/dL    Globulin 3.8 2.0 - 4.0 g/dL    Albumin/Globulin Ratio 0.4 (L) 1.1 - 2.2     CBC with Auto Differential    Collection Time: 07/26/21  5:50 AM   Result Value Ref Range    WBC 23.8 (H) 4.1 - 11.1 K/uL    RBC 2.72 (L) 4.10 - 5.70 M/uL    Hemoglobin 8.2 (L) 12.1 - 17.0 g/dL    Hematocrit 23.4 (L) 36.6 - 50.3 %    MCV 86.0 80.0 - 99.0 FL    MCH 30.1 26.0 - 34.0 PG    MCHC 35.0 30.0 - 36.5 g/dL    RDW 15.4 (H) 11.5 - 14.5 %    Platelets 508 (H) 150 - 400 K/uL    MPV 10.3 8.9 - 12.9 FL    Nucleated RBCs 0.0 0.0 PER 100 WBC    nRBC 0.00 0.00 - 0.01 K/uL    Neutrophils % 78 (H) 32 - 75 %    Lymphocytes % 7 (L) 12 - 49 %    Monocytes % 11 5 - 13 %  Eosinophils % 1 0 - 7 %    Basophils % 1 0 - 1 %    Myelocytes 2 (H) 0 %    Immature Granulocytes 0 %    Neutrophils Absolute 18.6 (H) 1.8 - 8.0 K/UL    Lymphocytes Absolute 1.7 0.8 - 3.5 K/UL    Monocytes Absolute 2.6 (H) 0.0 - 1.0 K/UL    Eosinophils Absolute 0.2 0.0 - 0.4 K/UL    Basophils Absolute 0.2 (H) 0.0 - 0.1 K/UL    Absolute Immature Granulocyte 0.0 K/UL    Differential Type Manual      RBC Comment Anisocytosis  1+       Magnesium    Collection Time: 07/26/21  5:50 AM   Result Value Ref Range    Magnesium 1.9 1.6 - 2.4 mg/dL   C-Reactive Protein    Collection Time: 07/26/21  5:50 AM   Result Value Ref Range    CRP 17.50 (H) 0.00 - 0.60 mg/dL    Procalcitonin    Collection Time: 07/26/21  5:50 AM   Result Value Ref Range    Procalcitonin 1.38 (H) 0 ng/mL   Vancomycin Level, Random    Collection Time: 07/26/21  5:50 AM   Result Value Ref Range    Vancomycin Rm 10.0 ug/mL    Dose Date/Time Not Detected      DOSE AMOUNT Not Detected Units     XR CHEST 1 VIEW   Final Result   No significant interval change.      XR CHEST 1 VIEW   Final Result   Right pleural catheter remains in place with interval reduction in   right pleural effusion and persistent right base atelectasis.      XR CHEST PORTABLE   Final Result      Right-sided pleural drain in place. No pneumothorax. There is ongoing moderate   to large right-sided effusion.            IR GUIDED THORACENTESIS PLEURAL   Final Result   Successful ultrasound guided right chest tube placement. There is   extensive loculation in the right-sided pleural effusion.               CT CHEST ABDOMEN PELVIS WO CONTRAST Additional Contrast? None   Final Result   1.  Severe right lower lobe pneumonia with large loculated right pleural   effusion.   2.  Reactive mediastinal lymphadenopathy.   3.  Inflamed distal small bowel. Correlate for inflammatory bowel disease.   4.  No bowel obstruction.         XR CHEST PORTABLE   Final Result      Large right pleural effusion with associated airspace disease, not better   characterized.          Total DC Time and Coordination of Care: 50 mins    Signed:  Otho Najjar, MD  07/26/2021  2:14 PM

## 2021-07-26 NOTE — Progress Notes (Signed)
Vancomycin Dosing Consult  Dylan Mccormick is a 41 y.o. male with PNA HAP and sepsis. Pharmacy was consulted by Dr. Leonidas Romberg to dose and monitor vancomycin. Today is day 3.    Antibiotic regimen: Vancomycin + Zosyn    Temp (24hrs), Avg:100.3 F (37.9 C), Min:98.4 F (36.9 C), Max:102.4 F (39.1 C)    Recent Labs     07/24/21  0500 07/25/21  0554 07/26/21  0550   WBC 32.0* 31.0* 23.8*   CREATININE 1.36* 0.94 0.81   BUN 35* 21* 16     Est CrCl: ~70 mL/min  Concomitant nephrotoxic drugs: None    Cultures:   7/16 Blood: pending    MRSA Swab: Not ordered, patient already received first dose of vancomycin    Target range: AUC/MIC 400-600    Recent level history:  Date/Time Dose & Interval Measured Level (mcg/mL) Associated AUC/MIC   7/17@1700  1750mg  IV x 1  5.4    7/19 @ 0550 1000mg  q12h 10.0 337        Assessment/Plan:   Afebrile, ARF on admission, AKI seems to have resolved, WBC down to 23, CRP and Procal elevated  Given significant improvement in renal function, pt is clearing vancomycin faster than previous regimen of 1000mg  q12h to maintain therapeutic AUC/MIC.   Will adjust regimen to 1000mg  q8h (predicted AUC/MIC would be ~500)  Next level scheduled for 7/20 @ 1500  Labs ordered for tomorrow to assess further changes in renal function  Antimicrobial stop date: 7 days (but tentatively could be longer with presence of a pleural drain and uncertain source control at present)

## 2021-07-26 NOTE — Progress Notes (Signed)
Discussed with thoracic surgeon Dr. Colin Mulders at [facility].  He has agreed for VATS for picking on his loculations and patient with pleural effusion growing  Streptococcus.  Patient will get transferred to hospital service at [facility]

## 2021-07-26 NOTE — Consults (Signed)
Consult Note            Date:07/26/2021        Patient Name:Dylan Mccormick     Date of Birth:Oct 04, 1980     Age:41 y.o.    Consults INFECTIOUS DISEASE    Chief Complaint     Chief Complaint   Patient presents with    Shortness of Breath    Flank Pain    Abdominal Pain    Cough      Right parapneumonic effusion    History Obtained From   patient    History of Present Illness    This is a 41 year old male who was admitted with right sided pleuritic chest pain and found to have right sided pleural effusion with neutrophilic pleocytosis and now growing Alpha streptococcus.  He now has chest tube in place and apparently felt to require VATS, for which he has been accepted at [facility]. Patient is currently on Vancomycin and Zosyn.  ID has been consulted for this reason.  He spiked temperature yesterday but WBC has been decreasing.      Patient is lying in bed and affirms history above. No cough or sputum production.  He states that his symptoms started 2 weeks prior to admission and slowly worsening.     Past Medical History     Past Medical History:   Diagnosis Date    Crohn's disease (HCC)     IV drug abuse (HCC)         Past Surgical History   History reviewed. No pertinent surgical history.     Medications     Prior to Admission medications    Medication Sig Start Date End Date Taking? Authorizing Provider   guaiFENesin (MUCINEX) 600 MG extended release tablet Take 1 tablet by mouth 2 times daily 07/26/21  Yes Arnette Felts, MD   lidocaine 4 % external patch Place 1 patch onto the skin daily 07/27/21  Yes Arnette Felts, MD   lamoTRIgine (LAMICTAL) 100 MG tablet Take by mouth daily   Yes Historical Provider, MD   adalimumab (HUMIRA) 40 MG/0.8ML injection Inject 0.8 mLs into the skin every 14 days   Yes Historical Provider, MD   mercaptopurine (PURINETHOL) 50 MG chemo tablet Take 1.5 tablets by mouth every evening   Yes Historical Provider, MD   promethazine (PHENERGAN) 25 MG tablet Take 1 tablet by mouth every  6 hours as needed for Nausea   Yes Historical Provider, MD        guaiFENesin [facility]) extended release tablet 600 mg, BID  acetaminophen (TYLENOL) tablet 650 mg, Q4H PRN   Or  acetaminophen (TYLENOL) suppository 650 mg, Q4H PRN  vancomycin (VANCOCIN) 1,000 mg in sodium chloride 0.9 % 250 mL IVPB (Vial2Bag), Q8H  [START ON 07/27/2021] Vancomycin Trough: Please draw level on 7/20 at 1500, Once  Vancomycin - please draw level 7/19 0600.  Thanks!, Once  promethazine (PHENERGAN) 6.25 MG/5ML syrup 6.25 mg, Q4H PRN  lidocaine 4 % external patch 1 patch, Daily  traMADol (ULTRAM) tablet 50 mg, Q6H PRN  lamoTRIgine (LAMICTAL) tablet 200 mg, Daily  sodium chloride flush 0.9 % injection 5-40 mL, 2 times per day  sodium chloride flush 0.9 % injection 5-40 mL, PRN  0.9 % sodium chloride infusion, PRN  enoxaparin (LOVENOX) injection 40 mg, Daily  ondansetron (ZOFRAN-ODT) disintegrating tablet 4 mg, Q8H PRN   Or  ondansetron (ZOFRAN) injection 4 mg, Q6H PRN  polyethylene glycol (GLYCOLAX) packet 17 g,  Daily PRN  potassium chloride 10 mEq/100 mL IVPB (Peripheral Line), PRN  magnesium sulfate 2000 mg in 50 mL IVPB premix, PRN  furosemide (LASIX) injection 40 mg, Once  vancomycin (VANCOCIN) intermittent dosing (placeholder), RX Placeholder  piperacillin-tazobactam (ZOSYN) 3,375 mg in sodium chloride 0.9 % 50 mL IVPB (mini-bag), q8h  melatonin tablet 5 mg, Nightly PRN        Allergies   Sulfa antibiotics    Social History     Social History       Tobacco History       Smoking Status  Some Days Smoking Tobacco Type  Cigarettes      Smokeless Tobacco Use  Never              Alcohol History       Alcohol Use Status  Not Currently              Drug Use       Drug Use Status  Not Currently Types  Other-see comments Comment  fentanyl - last use 3 weeks ago              Sexual Activity       Sexually Active  Not Asked                    Family History   History reviewed. No pertinent family history.    Review of Systems   Review of Systems    Constitutional:  Negative for activity change, chills and fever.   HENT: Negative.     Eyes: Negative.    Respiratory:  Positive for cough and shortness of breath.    Cardiovascular:  Positive for chest pain.   Gastrointestinal: Negative.    Endocrine: Negative.    Genitourinary: Negative.    Musculoskeletal: Negative.    Skin: Negative.    Allergic/Immunologic: Negative.    Neurological: Negative.    Hematological: Negative.    Psychiatric/Behavioral: Negative.        Physical Exam   BP 113/78   Pulse 72   Temp 98.6 F (37 C) (Oral)   Resp 18   Ht 5\' 11"  (1.803 m)   Wt 150 lb (68 kg)   SpO2 97%   BMI 20.92 kg/m      Physical Exam  Vitals and nursing note reviewed.   Constitutional:       General: He is not in acute distress.     Appearance: He is ill-appearing.   HENT:      Head: Normocephalic and atraumatic.      Right Ear: External ear normal.      Left Ear: External ear normal.      Nose: Nose normal.      Mouth/Throat:      Pharynx: Oropharynx is clear.   Eyes:      Extraocular Movements: Extraocular movements intact.      Pupils: Pupils are equal, round, and reactive to light.   Cardiovascular:      Rate and Rhythm: Normal rate and regular rhythm.      Heart sounds: No murmur heard.  Pulmonary:      Effort: Pulmonary effort is normal.      Breath sounds: Normal breath sounds.      Comments: Right sided chest tube drainage with straw colored fluid  Abdominal:      General: Bowel sounds are normal. There is no distension.      Palpations: Abdomen is soft.  Tenderness: There is no abdominal tenderness.   Genitourinary:     Comments: No Foley  Musculoskeletal:      Cervical back: Neck supple.      Right lower leg: No edema.      Left lower leg: No edema.   Skin:     Findings: No rash.             Comments: Multiple tatoos arms and chest   Neurological:      General: No focal deficit present.      Mental Status: He is alert and oriented to person, place, and time.   Psychiatric:         Mood and  Affect: Mood normal.         Behavior: Behavior normal.         Thought Content: Thought content normal.         Judgment: Judgment normal.       Labs    CBC:  Recent Labs     07/24/21  0500 07/25/21  0554 07/26/21  0550   WBC 32.0* 31.0* 23.8*   RBC 2.89* 2.76* 2.72*   HGB 8.7* 8.3* 8.2*   HCT 24.5* 23.5* 23.4*   MCV 84.8 85.1 86.0   RDW 14.6* 15.2* 15.4*   PLT 645* 555* 508*     CHEMISTRIES:  Recent Labs     07/24/21  0500 07/25/21  0554 07/26/21  0550   NA 143 140 140   K 3.0* 3.8 3.4*   CL 116* 113* 111*   CO2 18* 21 23   BUN 35* 21* 16   CREATININE 1.36* 0.94 0.81   GLUCOSE 94 88 82   PHOS 6.4*  --   --    MG  --   --  1.9     PT/INR:  Recent Labs     07/24/21  0500   PROTIME 19.1*   INR 1.5*     APTT:No results for input(s): APTT in the last 72 hours.  LIVER PROFILE:  Recent Labs     07/23/21  1523 07/24/21  0500 07/26/21  0550   AST 74* 50* 22   ALT 45 40 25   BILITOT 0.5 0.8 0.5   ALKPHOS 191* 177* 154*     Procal 1.38 <3.20   CRP 17.50 <28.90    Blood cultures (7/16) No growth 3 days  Blood cultures (7/16) No growth 3 days  Blood cultures (7/18) No growth 1 day  Pleural fluid (7/16) Moderate Alpha streptococcus    Imaging/Diagnostics   XR CHEST PORTABLE    Result Date: 07/24/2021  Right-sided pleural drain in place. No pneumothorax. There is ongoing moderate to large right-sided effusion.     XR CHEST PORTABLE    Result Date: 07/23/2021  Large right pleural effusion with associated airspace disease, not better characterized.    XR CHEST 1 VIEW    Result Date: 07/26/2021  No significant interval change.    XR CHEST 1 VIEW    Result Date: 07/26/2021  Right pleural catheter remains in place with interval reduction in right pleural effusion and persistent right base atelectasis.    CT CHEST ABDOMEN PELVIS WO CONTRAST Additional Contrast? None    Result Date: 07/23/2021  1.  Severe right lower lobe pneumonia with large loculated right pleural effusion. 2.  Reactive mediastinal lymphadenopathy. 3.  Inflamed distal  small bowel. Correlate for inflammatory bowel disease. 4.  No bowel obstruction.     IR GUIDED  THORACENTESIS PLEURAL    Result Date: 07/24/2021  Successful ultrasound guided right chest tube placement. There is extensive loculation in the right-sided pleural effusion.       Assessment      Hospital Problems             Last Modified POA    * (Principal) Sepsis (HCC) 07/23/2021 Yes    Renal failure 07/23/2021 Yes    Metabolic acidosis 07/23/2021 Yes    Normocytic anemia 07/23/2021 Yes    Acute respiratory failure with hypoxia (HCC) 07/23/2021 Yes    Severe protein-calorie malnutrition (HCC) 07/23/2021 Yes      Empyema, right lung, secondary to Alpha streptococcus, possibly Streptococcus intermedius, on IV Vancomycin and Zosyn  Sepsis with leukocytosis, elevated procal and CRP, resolving  Thrombocytosis, reactive, secondary to above  History of IVDU    Comment:  This kind of infection is often caused by oral flora which include microaerophilic strep such as Streptococcus intermedius, therefore, we can go with Vancomycin alone until susceptibility results are back.    Plan   1. Discontinue IV Zosyn  2. Continue IV Vancomycin pending susceptibility results  3. In am, repeat CBC, CRP and procal  4.  Follow-up blood cultures  5. Continue chest tube drainage  6.  Pending transfer to [facility] for VATS procedure    Electronically signed by Tania Ade, MD

## 2021-07-26 NOTE — Plan of Care (Signed)
Problem: Discharge Planning  Goal: Discharge to home or other facility with appropriate resources  Outcome: Progressing  Flowsheets (Taken 07/26/2021 2000)  Discharge to home or other facility with appropriate resources: Identify barriers to discharge with patient and caregiver     Problem: Pain  Goal: Verbalizes/displays adequate comfort level or baseline comfort level  Outcome: Progressing     Problem: Skin/Tissue Integrity  Goal: Absence of new skin breakdown  Description: 1.  Monitor for areas of redness and/or skin breakdown  2.  Assess vascular access sites hourly  3.  Every 4-6 hours minimum:  Change oxygen saturation probe site  4.  Every 4-6 hours:  If on nasal continuous positive airway pressure, respiratory therapy assess nares and determine need for appliance change or resting period.  Outcome: Progressing     Problem: Safety - Adult  Goal: Free from fall injury  Outcome: Progressing     Problem: Chronic Conditions and Co-morbidities  Goal: Patient's chronic conditions and co-morbidity symptoms are monitored and maintained or improved  Outcome: Progressing  Flowsheets (Taken 07/26/2021 2000)  Care Plan - Patient's Chronic Conditions and Co-Morbidity Symptoms are Monitored and Maintained or Improved: Monitor and assess patient's chronic conditions and comorbid symptoms for stability, deterioration, or improvement     Problem: Respiratory - Adult  Goal: Achieves optimal ventilation and oxygenation  Outcome: Progressing  Flowsheets (Taken 07/26/2021 2000)  Achieves optimal ventilation and oxygenation:   Assess for changes in respiratory status   Position to facilitate oxygenation and minimize respiratory effort     Problem: Cardiovascular - Adult  Goal: Maintains optimal cardiac output and hemodynamic stability  Outcome: Progressing  Goal: Absence of cardiac dysrhythmias or at baseline  Outcome: Progressing     Problem: Infection - Adult  Goal: Absence of infection at discharge  Outcome:  Progressing  Flowsheets (Taken 07/26/2021 2000)  Absence of infection at discharge: Assess and monitor for signs and symptoms of infection  Goal: Absence of infection during hospitalization  Outcome: Progressing  Flowsheets (Taken 07/26/2021 2000)  Absence of infection during hospitalization: Assess and monitor for signs and symptoms of infection  Goal: Absence of fever/infection during anticipated neutropenic period  Outcome: Progressing  Flowsheets (Taken 07/26/2021 2000)  Absence of fever/infection during anticipated neutropenic period: Monitor white blood cell count     Problem: Metabolic/Fluid and Electrolytes - Adult  Goal: Electrolytes maintained within normal limits  Outcome: Progressing  Flowsheets (Taken 07/26/2021 2000)  Electrolytes maintained within normal limits: Monitor labs and assess patient for signs and symptoms of electrolyte imbalances  Goal: Hemodynamic stability and optimal renal function maintained  Outcome: Progressing  Flowsheets (Taken 07/26/2021 2000)  Hemodynamic stability and optimal renal function maintained: Monitor labs and assess for signs and symptoms of volume excess or deficit  Goal: Glucose maintained within prescribed range  Outcome: Progressing  Flowsheets (Taken 07/26/2021 2000)  Glucose maintained within prescribed range: Monitor blood glucose as ordered     Problem: Gastrointestinal - Adult  Goal: Maintains or returns to baseline bowel function  Outcome: Progressing  Flowsheets (Taken 07/26/2021 2000)  Maintains or returns to baseline bowel function: Assess bowel function  Goal: Maintains adequate nutritional intake  Outcome: Progressing  Flowsheets (Taken 07/26/2021 2000)  Maintains adequate nutritional intake: Monitor percentage of each meal consumed     Problem: Genitourinary - Adult  Goal: Absence of urinary retention  Outcome: Progressing  Flowsheets (Taken 07/26/2021 2000)  Absence of urinary retention: Monitor intake/output and perform bladder scan as needed

## 2021-07-26 NOTE — Care Coordination-Inpatient (Addendum)
8315: Chart reviewed.    Per notes, patient with chest tube and on double IV ABX therapy followed via pulmonology.    CM will continue to follow patient and recs of medical team.    1500: Transfer/Discharge orders noted.

## 2021-07-27 DIAGNOSIS — F17290 Nicotine dependence, other tobacco product, uncomplicated: Secondary | ICD-10-CM | POA: Diagnosis not present

## 2021-07-27 DIAGNOSIS — Z7960 Long term (current) use of unspecified immunomodulators and immunosuppressants: Secondary | ICD-10-CM | POA: Diagnosis not present

## 2021-07-27 DIAGNOSIS — R918 Other nonspecific abnormal finding of lung field: Secondary | ICD-10-CM | POA: Diagnosis not present

## 2021-07-27 DIAGNOSIS — J984 Other disorders of lung: Secondary | ICD-10-CM | POA: Diagnosis not present

## 2021-07-27 DIAGNOSIS — A491 Streptococcal infection, unspecified site: Secondary | ICD-10-CM | POA: Diagnosis not present

## 2021-07-27 DIAGNOSIS — D84821 Immunodeficiency due to drugs: Secondary | ICD-10-CM | POA: Diagnosis not present

## 2021-07-27 DIAGNOSIS — J45909 Unspecified asthma, uncomplicated: Secondary | ICD-10-CM | POA: Diagnosis not present

## 2021-07-27 DIAGNOSIS — F17210 Nicotine dependence, cigarettes, uncomplicated: Secondary | ICD-10-CM | POA: Diagnosis not present

## 2021-07-27 DIAGNOSIS — J869 Pyothorax without fistula: Secondary | ICD-10-CM | POA: Diagnosis not present

## 2021-07-27 DIAGNOSIS — J90 Pleural effusion, not elsewhere classified: Secondary | ICD-10-CM | POA: Diagnosis not present

## 2021-07-27 DIAGNOSIS — E875 Hyperkalemia: Secondary | ICD-10-CM | POA: Diagnosis not present

## 2021-07-27 DIAGNOSIS — K5090 Crohn's disease, unspecified, without complications: Secondary | ICD-10-CM | POA: Diagnosis not present

## 2021-07-27 DIAGNOSIS — J939 Pneumothorax, unspecified: Secondary | ICD-10-CM | POA: Diagnosis not present

## 2021-07-27 DIAGNOSIS — J9811 Atelectasis: Secondary | ICD-10-CM | POA: Diagnosis not present

## 2021-07-27 DIAGNOSIS — A419 Sepsis, unspecified organism: Secondary | ICD-10-CM | POA: Diagnosis not present

## 2021-07-27 DIAGNOSIS — J918 Pleural effusion in other conditions classified elsewhere: Secondary | ICD-10-CM | POA: Diagnosis not present

## 2021-07-27 LAB — BASIC METABOLIC PANEL
Anion Gap: 9 mmol/L (ref 5–15)
BUN: 11 mg/dL (ref 6–20)
Bun/Cre Ratio: 17 (ref 12–20)
CO2: 23 mmol/L (ref 21–32)
Calcium: 7.9 mg/dL — ABNORMAL LOW (ref 8.5–10.1)
Chloride: 106 mmol/L (ref 97–108)
Creatinine: 0.65 mg/dL — ABNORMAL LOW (ref 0.70–1.30)
Est, Glom Filt Rate: >60 mL/min/{1.73_m2} (ref >60)
Glucose: 76 mg/dL (ref 65–100)
Potassium: 3.6 mmol/L (ref 3.5–5.1)
Sodium: 138 mmol/L (ref 136–145)

## 2021-07-27 LAB — CBC WITH AUTO DIFFERENTIAL
Absolute Immature Granulocyte: 0 10*3/uL
Basophils %: 0 % (ref 0–1)
Basophils Absolute: 0 10*3/uL (ref 0.0–0.1)
Eosinophils %: 2 % (ref 0–7)
Eosinophils Absolute: 0.4 10*3/uL (ref 0.0–0.4)
Hematocrit: 25.1 % — ABNORMAL LOW (ref 36.6–50.3)
Hemoglobin: 8.4 g/dL — ABNORMAL LOW (ref 12.1–17.0)
Immature Granulocytes: 0 %
Lymphocytes %: 13 % (ref 12–49)
Lymphocytes Absolute: 2.9 10*3/uL (ref 0.8–3.5)
MCH: 29.5 PG (ref 26.0–34.0)
MCHC: 33.5 g/dL (ref 30.0–36.5)
MCV: 88.1 FL (ref 80.0–99.0)
MPV: 10.2 FL (ref 8.9–12.9)
Monocytes %: 11 % (ref 5–13)
Monocytes Absolute: 2.4 10*3/uL — ABNORMAL HIGH (ref 0.0–1.0)
Myelocytes: 4 % — ABNORMAL HIGH
Neutrophils %: 70 % (ref 32–75)
Neutrophils Absolute: 15.4 10*3/uL — ABNORMAL HIGH (ref 1.8–8.0)
Nucleated RBCs: 0 PER 100 WBC
Platelets: 520 10*3/uL — ABNORMAL HIGH (ref 150–400)
RBC: 2.85 M/uL — ABNORMAL LOW (ref 4.10–5.70)
RDW: 15.1 % — ABNORMAL HIGH (ref 11.5–14.5)
WBC: 22 10*3/uL — ABNORMAL HIGH (ref 4.1–11.1)
nRBC: 0 10*3/uL (ref 0.00–0.01)

## 2021-07-27 LAB — C-REACTIVE PROTEIN: CRP: 15.3 mg/dL — ABNORMAL HIGH (ref 0.00–0.60)

## 2021-07-27 LAB — PROCALCITONIN: Procalcitonin: 0.76 ng/mL — ABNORMAL HIGH

## 2021-07-27 MED ORDER — VANCOMYCIN HCL 1 G IV SOLR
1 g | Freq: Three times a day (TID) | INTRAVENOUS | Status: DC
Start: 2021-07-27 — End: 2021-07-27
  Administered 2021-07-27: 17:00:00 1000 mg via INTRAVENOUS

## 2021-07-27 MED ORDER — BENZONATATE 100 MG PO CAPS
100 | Freq: Three times a day (TID) | ORAL | Status: DC | PRN
Start: 2021-07-27 — End: 2021-07-27
  Administered 2021-07-27: 03:00:00 200 mg via ORAL

## 2021-07-27 MED ORDER — VANCOMYCIN INTERMITTENT DOSING (PLACEHOLDER)
Freq: Once | INTRAVENOUS | Status: DC
Start: 2021-07-27 — End: 2021-07-27

## 2021-07-27 MED ORDER — VANCOMYCIN INTERMITTENT DOSING (PLACEHOLDER)
INTRAVENOUS | Status: DC
Start: 2021-07-27 — End: 2021-07-27

## 2021-07-27 MED FILL — VANCOMYCIN INTERMITTENT DOSING (PLACEHOLDER): INTRAVENOUS | Qty: 1

## 2021-07-27 NOTE — Care Coordination-Inpatient (Signed)
0830: Chart reviewed.    Transfer/Discharge orders noted.

## 2021-07-27 NOTE — Progress Notes (Signed)
Progress Note  Date:07/27/2021       Room:222/01  Patient Name:Dylan Mccormick     Date of Birth:[birth year]     Age:41 y.o.        Subjective    Subjective Patient followed for right lung empyema secondary alpha strep. Currently on Vancomycin. Has chest tube in place but planned transfer to [facility] for VATS procedure. Still with low grade temperatures. Patient resting comfortably with no new complaints.      Objective         Vitals Last 24 Hours:  TEMPERATURE:  Temp  Avg: 99.5 F (37.5 C)  Min: 98.4 F (36.9 C)  Max: 100.4 F (38 C)  RESPIRATIONS RANGE: Resp  Avg: 18.7  Min: 17  Max: 22  PULSE OXIMETRY RANGE: SpO2  Avg: 96 %  Min: 95 %  Max: 97 %  PULSE RANGE: Pulse  Avg: 73.2  Min: 66  Max: 80  BLOOD PRESSURE RANGE: Systolic (24hrs), Avg:114 , Min:108 , Max:122   ; Diastolic (24hrs), Avg:69, Min:62, Max:74       Objective  Vitals and nursing note reviewed.   Constitutional:       General: He is not in acute distress.     Appearance: He is ill-appearing.   Pulmonary:      Effort: Pulmonary effort is normal.      Breath sounds: Normal breath sounds.      Comments: Right sided chest tube drainage with straw colored fluid  Abdominal:      General: Bowel sounds are normal. There is no distension.      Palpations: Abdomen is soft.      Tenderness: There is no abdominal tenderness.   Genitourinary:     Comments: No Foley  Musculoskeletal:      Cervical back: Neck supple.      Right lower leg: No edema.      Left lower leg: No edema.   Skin:     Findings: No rash.              Comments: Multiple tatoos arms and chest   Neurological:      General: No focal deficit present.      Mental Status: He is alert and oriented to person, place, and time.   Psychiatric:   normal behavior    Labs/Imaging/Diagnostics    Labs:  CBC:  Recent Labs     07/25/21  0554 07/26/21  0550 07/27/21  0508   WBC 31.0* 23.8* 22.0*   RBC 2.76* 2.72* 2.85*   HGB 8.3* 8.2* 8.4*   HCT 23.5* 23.4* 25.1*   MCV 85.1 86.0 88.1   RDW 15.2* 15.4* 15.1*    PLT 555* 508* 520*        Procal 0.76 <1.38 <3.20   CRP 15.30 <17.50 <28.90     Blood cultures (7/16) No growth 4 days  Blood cultures (7/16) No growth 4 days  Blood cultures (7/18) No growth 2 days  Pleural fluid (7/16) Moderate Streptococcus intermedius     Imaging Last 24 Hours:  XR CHEST PORTABLE     Result Date: 07/23/2021  Large right pleural effusion with associated airspace disease, not better characterized.      XR CHEST 1 VIEW       XR CHEST 1 VIEW    Result Date: 07/26/2021  EXAM: XR CHEST 1 VIEW HISTORY: Right pleural effusion. COMPARISON: 07/26/2021 FINDINGS: Portable AP. A right pigtail chest tube remains in place. There is an  unchanged loculated mild right pleural effusion with right basilar atelectasis. Subcutaneous emphysema is seen along the right chest wall. The left lung is clear.     No significant interval change.    XR CHEST 1 VIEW    Result Date: 07/26/2021      Right pleural catheter remains in place with interval reduction in right pleural effusion and persistent right base atelectasis.    Assessment//Plan           Hospital Problems             Last Modified POA    * (Principal) Sepsis (HCC) 07/23/2021 Yes    Renal failure 07/23/2021 Yes    Metabolic acidosis 07/23/2021 Yes    Normocytic anemia 07/23/2021 Yes    Acute respiratory failure with hypoxia (HCC) 07/23/2021 Yes    Severe protein-calorie malnutrition (HCC) 07/23/2021 Yes       Empyema, right lung, secondary to Streptococcus intermedius, on IV Vancomycin   Sepsis with leukocytosis, elevated procal and CRP, resolving  Thrombocytosis, reactive, secondary to above  History of IVDU     Comment:  Streptococcus intermedius was isolated and should be sensitive to Vancomycin and may be sensitive to Unasyn and Levaquin.  WBC, procal and CRP decreasing.     Plan   1. Discontinue IV Zosyn  2. Continue IV Vancomycin pending susceptibility results  3. In am, repeat CBC, CRP and procal  4.  Follow-up blood cultures  5. Continue chest tube drainage  6.   Pending transfer to [facility] for VATS procedure       Electronically signed by Tania Ade, MD

## 2021-07-27 NOTE — Discharge Summary (Signed)
Follow-up - awaiting transfer to [facility].    No new complaint. No increased sob or other change.    AxOx3  VS  Chest tube unchanged  Good breath sounds ant bilat    A/P as below  Spoke with nursing    Hospitalist Discharge Summary     Patient ID:    Dylan Mccormick  [id number]  41 y.o.  [birth year]    Admit date: 07/23/2021    Discharge date : 07/27/2021      Final Diagnoses & Acute Mgmt:    # Sepsis d/t Large Loculated Right Pleural/Parapneumonic Effusion, now with Chest Drain/Tube by IR. S/p initial ICU mgmt.     => Cx showing Alpha Strep    => IV Vanc, Zosyn    => Per D/w Dr. Haider/Pulmonology patient will need VATS. He spoke with Dr. Derwood Kaplan (Thoracic Surgery) at [facility] who has accepted patient for VATS on Friday. I spoke with Dr. Ezra Sites via [facility] who has accepted patient for further mgmt.    # Acute Hypoxic Resp Failure d/t above; stable  # AKI d/t Sepsis; resolved  # Metabolic Acidosis; resolved  # Severe Protein-caloric Malnutrition  # Hx IVDU (fentanyl; ~2 weeks ago)  # Crohn's Disease on Immunosuppressants    SUBJECTIVE: M/s pain around chest tube but otherwise doing well. Informed patient of above procedural recommendations. Patient understood and agreed with transfer and procedure.    Reason for Hospitalization:   41 yr old male with a hx of crohns dz and IVDU who presented to the ED with a cc of progressive DOE and weakness.  Symptoms were progressively worse over the past several days and ultimately presented to the ED when he developed a pleuritic Rt flank pain that radiated into his abd.  PT was febrile and hypoxic on arrival but remained hemodynamically stable. A severe RLL infiltrate and large secondary pleural effusion was noted on CT and acute renal failure present on initial lab work and radiographic studies.    Discharge Medications       Medication List        START taking these medications      guaiFENesin 600 MG extended  release tablet  Commonly known as: MUCINEX  Take 1 tablet by mouth 2 times daily     lidocaine 4 % external patch  Place 1 patch onto the skin daily            CONTINUE taking these medications      adalimumab 40 MG/0.8ML injection  Commonly known as: HUMIRA     lamoTRIgine 100 MG tablet  Commonly known as: LAMICTAL     mercaptopurine 50 MG chemo tablet  Commonly known as: PURINETHOL     promethazine 25 MG tablet  Commonly known as: PHENERGAN               Where to Get Your Medications        Information about where to get these medications is not yet available    Ask your nurse or doctor about these medications  guaiFENesin 600 MG extended release tablet  lidocaine 4 % external patch       Condition at Discharge:  Stable    Disposition  Transfer to Acute Care Hospital    Code Status:  Full Code     Discharge Exam:  Patient seen and examined by me on discharge day.    Constitutional: NAD, Awake, conversant, comfortable    Neck: Supple  Cardiovascular: S1S2; Tele: sinus    Respiratory:  CTA ant bilat; vesicular breath sounds, no overt wheeze     GI: Soft, NT; no guarding/rigidity; + bowel sounds    Musculoskeletal: Right chest tube    Neurological:  AxOx3; No new focal weakness; No overt tremors    Psychiatric: Appropriate mood; oriented, coherent/cogent    Skin: No new rash or significant discoloration     Vascular: Palpable pulses; No edema      CONSULTATIONS: pulmonary/intensive care, IR    Significant Diagnostic Studies:   Recent Results (from the past 24 hour(s))   CBC with Auto Differential    Collection Time: 07/27/21  5:08 AM   Result Value Ref Range    WBC 22.0 (H) 4.1 - 11.1 K/uL    RBC 2.85 (L) 4.10 - 5.70 M/uL    Hemoglobin 8.4 (L) 12.1 - 17.0 g/dL    Hematocrit 78.2 (L) 36.6 - 50.3 %    MCV 88.1 80.0 - 99.0 FL    MCH 29.5 26.0 - 34.0 PG    MCHC 33.5 30.0 - 36.5 g/dL    RDW 42.3 (H) 53.6 - 14.5 %    Platelets 520 (H) 150 - 400 K/uL    MPV 10.2 8.9 - 12.9 FL    Nucleated RBCs 0.0 0.0 PER 100 WBC    nRBC  0.00 0.00 - 0.01 K/uL    Neutrophils % 70 32 - 75 %    Lymphocytes % 13 12 - 49 %    Monocytes % 11 5 - 13 %    Eosinophils % 2 0 - 7 %    Basophils % 0 0 - 1 %    Myelocytes 4 (H) 0 %    Immature Granulocytes 0 %    Neutrophils Absolute 15.4 (H) 1.8 - 8.0 K/UL    Lymphocytes Absolute 2.9 0.8 - 3.5 K/UL    Monocytes Absolute 2.4 (H) 0.0 - 1.0 K/UL    Eosinophils Absolute 0.4 0.0 - 0.4 K/UL    Basophils Absolute 0.0 0.0 - 0.1 K/UL    Absolute Immature Granulocyte 0.0 K/UL    Differential Type Manual      Platelet Comment Large Platelets      RBC Comment Polychromasia  1+       Basic Metabolic Panel    Collection Time: 07/27/21  5:08 AM   Result Value Ref Range    Sodium 138 136 - 145 mmol/L    Potassium 3.6 3.5 - 5.1 mmol/L    Chloride 106 97 - 108 mmol/L    CO2 23 21 - 32 mmol/L    Anion Gap 9 5 - 15 mmol/L    Glucose 76 65 - 100 mg/dL    BUN 11 6 - 20 mg/dL    Creatinine 1.44 (L) 0.70 - 1.30 mg/dL    Bun/Cre Ratio 17 12 - 20      Est, Glom Filt Rate >60 >60 ml/min/1.35m2    Calcium 7.9 (L) 8.5 - 10.1 mg/dL   C-Reactive Protein    Collection Time: 07/27/21  5:08 AM   Result Value Ref Range    CRP 15.30 (H) 0.00 - 0.60 mg/dL   Procalcitonin    Collection Time: 07/27/21  5:08 AM   Result Value Ref Range    Procalcitonin 0.76 (H) 0 ng/mL     XR CHEST 1 VIEW   Final Result   No significant interval change.      XR CHEST 1 VIEW  Final Result   Right pleural catheter remains in place with interval reduction in   right pleural effusion and persistent right base atelectasis.      XR CHEST PORTABLE   Final Result      Right-sided pleural drain in place. No pneumothorax. There is ongoing moderate   to large right-sided effusion.            IR GUIDED THORACENTESIS PLEURAL   Final Result   Successful ultrasound guided right chest tube placement. There is   extensive loculation in the right-sided pleural effusion.               CT CHEST ABDOMEN PELVIS WO CONTRAST Additional Contrast? None   Final Result   1.  Severe right lower  lobe pneumonia with large loculated right pleural   effusion.   2.  Reactive mediastinal lymphadenopathy.   3.  Inflamed distal small bowel. Correlate for inflammatory bowel disease.   4.  No bowel obstruction.         XR CHEST PORTABLE   Final Result      Large right pleural effusion with associated airspace disease, not better   characterized.          Total DC Time and Coordination of Care: 50 mins    Signed:  Arnette Felts, MD  07/27/2021  6:14 PM

## 2021-07-27 NOTE — Progress Notes (Signed)
This nurse was informed that there's available room for this patient at [facility]. Informed the patient. Belongings and EMTALA prepared by co nurse and charge nurse. Report called to [facility] for room 243 at telephone number [phone]. Talked to [place], Charity fundraiser. Nurse report given( patient info,status, past medical history,  latest labs and vs).  Transport Animator) came in, report, facesheet and EMTALA given. Transferred per stretcher with nasal cannula at 1.5L, chest tube in place, and IV lines still in place; with IT consultant. Belongings with patient.

## 2021-07-27 NOTE — Progress Notes (Signed)
Vancomycin Dosing Consult  Dylan Mccormick is a 41 y.o. male with PNA HAP and sepsis. Pharmacy was consulted by Dr. Leonidas Romberg to dose and monitor vancomycin. Today is day 4.    Antibiotic regimen: Vancomycin     Temp (24hrs), Avg:99.5 F (37.5 C), Min:98.4 F (36.9 C), Max:100.4 F (38 C)    Recent Labs     07/25/21  0554 07/26/21  0550 07/27/21  0508   WBC 31.0* 23.8* 22.0*   CREATININE 0.94 0.81 0.65*   BUN 21* 16 11     Est CrCl: > 120 mL/min  Concomitant nephrotoxic drugs: None    Cultures:   7/16 Blood: pending  7/17 Blood: alpha hemolytic strep (susceptibilities pending)  7/18 Blood: NGTD x 48 hours    MRSA Swab: Not ordered, patient already received first dose of vancomycin    Target range: AUC/MIC 400-600    Recent level history:  Date/Time Dose & Interval Measured Level (mcg/mL) Associated AUC/MIC   7/17@1700  1750mg  IV x 1  5.4    7/19 @ 0550 1000mg  q12h 10.0 337        Assessment/Plan:   Afebrile, ARF on admission, but currently resolved, WBC down to 22, CRP and Procal elevated  Given significant improvement in renal function, pt is clearing vancomycin faster than previous regimen of 1000mg  q12h to maintain therapeutic AUC/MIC.   Last dose was at 1713 on 7/19, restarting today with 1000mg  q8h @ 1300  Will plan to check a trough on 7/21 @ 1100 and adjust dose if needed  Labs ordered x 3 days to assess further changes in renal function  Antimicrobial stop date: 7 days (but tentatively could be longer with presence of a pleural drain and uncertain source control at present)

## 2021-07-27 NOTE — Progress Notes (Signed)
Pulmonary progress note  [name] is a 41 years old Caucasian male who is known to have history of Crohn's disease, history of intravenous drug use.  [facility].  Patient is admitted with 2 weeks history of not feeling well, right-sided pleuritic chest pain and generalized weakness.  Later on he started running fever with cough productive of scanty amount of creamy colored sputum.  Progressively got worse, ultimately came to the ER.  Chest x-ray was done in his initial work-up which showed right lung infiltrates.  He was also noted to have pleural effusion.  His initial WBC count was 36,000.  Patient was also noted to have elevated BUN and creatinine of 53 and 2.32.  Patient is admitted for sepsis with large loculated right pleural effusion and pneumonia.  Patient noted that his last intravenous drug use of fentanyl was about 2 weeks ago  Subjective:     Patient examined at bedside  He had pleural drain placed other day.  yesterday, about 950 cc of tan-colored fluid is drained.  Another 150 cc of fluid drained out since yesterday.  WBC count has dropped down to 22,000 now.   Denies any fever.  Has marginal appetite  Tachypnea has shown improvement.  Pleural fluid analysis revealed exudative effusion.  It is growing Streptococcus, gram-positive cocci in chains  Renal functions have improved with hydration      Patient Active Problem List   Diagnosis    Sepsis (HCC)    Renal failure    Metabolic acidosis    Normocytic anemia    Acute respiratory failure with hypoxia (HCC)    Severe protein-calorie malnutrition (HCC)     Past Medical History:   Diagnosis Date    Crohn's disease (HCC)     IV drug abuse (HCC)       History reviewed. No pertinent family history.   Social History     Tobacco Use    Smoking status: Some Days     Types: Cigarettes    Smokeless tobacco: Never   Substance Use Topics    Alcohol use: Not Currently      History reviewed. No pertinent surgical history.   Prior to Admission medications    Medication Sig Start Date End Date Taking? Authorizing Provider   guaiFENesin (MUCINEX) 600 MG extended release tablet Take 1 tablet by mouth 2 times daily 07/26/21  Yes Arnette FeltsSyed S Ashraf, MD   lidocaine 4 % external patch Place 1 patch onto the skin daily 07/27/21  Yes Arnette FeltsSyed S Ashraf, MD   lamoTRIgine (LAMICTAL) 100 MG tablet Take by mouth daily   Yes Historical Provider, MD   adalimumab (HUMIRA) 40 MG/0.8ML injection Inject 0.8 mLs into the skin every 14 days   Yes Historical Provider, MD   mercaptopurine (PURINETHOL) 50 MG chemo tablet Take 1.5 tablets by mouth every evening   Yes Historical Provider, MD   promethazine (PHENERGAN) 25 MG tablet Take 1 tablet by mouth every 6 hours as needed for Nausea   Yes Historical Provider, MD     Allergies   Allergen Reactions    Sulfa Antibiotics Rash        Review of Systems:  A comprehensive review of systems was negative except for that written in the History of Present Illness.    Labs:    Recent Labs     07/25/21  0554 07/26/21  0550 07/27/21  [id number]  WBC 31.0* 23.8* 22.0*   HGB 8.3* 8.2* 8.4*   HCT 23.5* 23.4* 25.1*   PLT 555* 508* 520*     Recent Labs     07/25/21  0554 07/26/21  0550 07/27/21  0508   NA 140 140 138   K 3.8 3.4* 3.6   CL 113* 111* 106   CO2 21 23 23    GLUCOSE 88 82 76   BUN 21* 16 11   CREATININE 0.94 0.81 0.65*   CALCIUM 7.9* 8.0* 7.9*   MG  --  1.9  --    BILITOT  --  0.5  --    AST  --  22  --    ALT  --  25  --      No results for input(s): PHART, PCO2ART, PO2ART, HCO3ART, BEART, TCO2ARRT, HGBART, PO2CORART, FIO2A, O2SATART, OXYHEM, CARBOXHGBART, METHGBART, O2CONTART, PHCORART, TEMP in the last 72 hours.    Invalid input(s): PCO2COART      Objective:   Blood pressure 111/69, pulse 66, temperature 98.4 F (36.9 C), temperature source Oral, resp. rate 18, height 1.803 m (5\' 11" ), weight 68 kg (150 lb), SpO2 97 %.  Temp (24hrs), Avg:99.5 F (37.5 C), Min:98.4 F (36.9  C), Max:100.4 F (38 C)    XR CHEST 1 VIEW   Final Result   No significant interval change.      XR CHEST 1 VIEW   Final Result   Right pleural catheter remains in place with interval reduction in   right pleural effusion and persistent right base atelectasis.      XR CHEST PORTABLE   Final Result      Right-sided pleural drain in place. No pneumothorax. There is ongoing moderate   to large right-sided effusion.            IR GUIDED THORACENTESIS PLEURAL   Final Result   Successful ultrasound guided right chest tube placement. There is   extensive loculation in the right-sided pleural effusion.               CT CHEST ABDOMEN PELVIS WO CONTRAST Additional Contrast? None   Final Result   1.  Severe right lower lobe pneumonia with large loculated right pleural   effusion.   2.  Reactive mediastinal lymphadenopathy.   3.  Inflamed distal small bowel. Correlate for inflammatory bowel disease.   4.  No bowel obstruction.         XR CHEST PORTABLE   Final Result      Large right pleural effusion with associated airspace disease, not better   characterized.         Data Review:   Current Facility-Administered Medications   Medication Dose Route Frequency    guaiFENesin (MUCINEX) extended release tablet 600 mg  600 mg Oral BID    acetaminophen (TYLENOL) tablet 650 mg  650 mg Oral Q4H PRN    Or    acetaminophen (TYLENOL) suppository 650 mg  650 mg Rectal Q4H PRN    ALPRAZolam (XANAX) tablet 0.25 mg  0.25 mg Oral TID PRN    benzonatate (TESSALON) capsule 200 mg  200 mg Oral TID PRN    promethazine (PHENERGAN) 6.25 MG/5ML syrup 6.25 mg  6.25 mg Oral Q4H PRN    lidocaine 4 % external patch 1 patch  1 patch TransDERmal Daily    traMADol (ULTRAM) tablet 50 mg  50 mg Oral Q6H PRN    lamoTRIgine (LAMICTAL) tablet 200 mg  200 mg Oral Daily  sodium chloride flush 0.9 % injection 5-40 mL  5-40 mL IntraVENous 2 times per day    sodium chloride flush 0.9 % injection 5-40 mL  5-40 mL IntraVENous PRN    0.9 % sodium chloride infusion    IntraVENous PRN    enoxaparin (LOVENOX) injection 40 mg  40 mg SubCUTAneous Daily    ondansetron (ZOFRAN-ODT) disintegrating tablet 4 mg  4 mg Oral Q8H PRN    Or    ondansetron (ZOFRAN) injection 4 mg  4 mg IntraVENous Q6H PRN    polyethylene glycol (GLYCOLAX) packet 17 g  17 g Oral Daily PRN    potassium chloride 10 mEq/100 mL IVPB (Peripheral Line)  10 mEq IntraVENous PRN    magnesium sulfate 2000 mg in 50 mL IVPB premix  2,000 mg IntraVENous PRN    furosemide (LASIX) injection 40 mg  40 mg IntraVENous Once    melatonin tablet 5 mg  5 mg Oral Nightly PRN        Exam:      Patient is currently alert and responding appropriately.  Mildly tachypneic.  Head normocephalic and atraumatic, pupils are round sponsored light sclera icteric and conjunctive are pink  Neck is supple.  No cervical lymphadenopathy.  Thyroid not enlarged  Chest: Has rhonchi audible right lung base along with decreased right-sided air entry at base.  No chest wall tenderness noted.  No wheezing was appreciated.    Has right small bore pleural catheter in place  Abdomen: Soft, nontender, no visceromegaly  Extremities: No edema, sinus or clubbing  Neuro: No focal motor deficit    Impression:   This is middle aged male with known history of Crohn's disease, IV drug use who has been feeling sick for the last 2 weeks with generalized weakness, tiredness and fatigue, low-grade fever along with cough.  Noted to have elevated WBC count of 36,000 on admission, chest x-ray and CT scan of chest showing moderate to large right loculated pleural effusion along with right basal infiltrate.  Patient also has elevated BUN and creatinine.    Plan:   1.  Hypoxic respiratory failure:  Secondary to moderate to large loculated right pleural effusion along with dense right lower lobe pneumonia.  It has shown improvement after right thoracentesis and pleural drain  Patient had some complaints of left upper chest pain, was placed on nasal cannula oxygen last night   Be  switched to room air keeping saturations above 94%.    2.  Severe sepsis:  Patient has severe sepsis with elevated WBC count, acute renal failure, tachycardia and tachypnea.  Lactic acid level is normal.  WBC count has now come down to 232000 . On IV Zosyn and vancomycin  Appreciated ID consultation and recommendations.  IV Zosyn will be discontinued.  3.  Loculated right pleural effusion:  Patient has loculated right pleural effusion, seems to be parapneumonic effusion.  Underwent drain placement along with drainage of right pleural effusion.  Patient has 450 cc of tan-colored fluid in the drain bottle.  Pleural fluid studies revealed LDH 3817, protein 4.7 g  TriGlyceride 41  Its an exudative effusion   Pleural fluid cultures growing strep probably intermedius  Cytology results pending  Continue IV Zosyn, IV vancomycin.  Patient was initially given a dose of Rocephin and Zithromax in the ER.   check serial CBC.  He is scheduled to get transferred to [facility] for VATS   4. IV drug use  he is counseled to quit IV  drug use  Urine drug screen negative  5.  Crohn's disease:  Patient is immunosuppressed from Crohn's disease.  Patient benefit from Ultram q 8 hours.    Management is discussed with patient and covering nurse  More than 45 minutes were spent in patient's evaluation, review of lab data, imaging studies and decision making    .  A. Thompson Grayer, MD  [facility]     This dictation was done by dragon, computer voice recognition software.  Often unanticipated grammatical, syntax, Homer phones and other interpretive errors are inadvertently transcribed.  Please excuse errors that have escaped final proofreading

## 2021-07-27 NOTE — Plan of Care (Signed)
Problem: Discharge Planning  Goal: Discharge to home or other facility with appropriate resources  Outcome: Completed  Flowsheets (Taken 07/27/2021 1030 by Leanora Ivanoff, RN)  Discharge to home or other facility with appropriate resources: Identify barriers to discharge with patient and caregiver     Problem: Pain  Goal: Verbalizes/displays adequate comfort level or baseline comfort level  Outcome: Completed     Problem: Skin/Tissue Integrity  Goal: Absence of new skin breakdown  Description: 1.  Monitor for areas of redness and/or skin breakdown  2.  Assess vascular access sites hourly  3.  Every 4-6 hours minimum:  Change oxygen saturation probe site  4.  Every 4-6 hours:  If on nasal continuous positive airway pressure, respiratory therapy assess nares and determine need for appliance change or resting period.  Outcome: Completed     Problem: Safety - Adult  Goal: Free from fall injury  Outcome: Completed     Problem: Chronic Conditions and Co-morbidities  Goal: Patient's chronic conditions and co-morbidity symptoms are monitored and maintained or improved  Outcome: Completed  Flowsheets (Taken 07/27/2021 1030 by Leanora Ivanoff, RN)  Care Plan - Patient's Chronic Conditions and Co-Morbidity Symptoms are Monitored and Maintained or Improved: Monitor and assess patient's chronic conditions and comorbid symptoms for stability, deterioration, or improvement     Problem: Respiratory - Adult  Goal: Achieves optimal ventilation and oxygenation  Outcome: Completed  Flowsheets (Taken 07/27/2021 2011)  Achieves optimal ventilation and oxygenation: Assess for changes in respiratory status     Problem: Cardiovascular - Adult  Goal: Maintains optimal cardiac output and hemodynamic stability  Outcome: Completed  Flowsheets  Taken 07/27/2021 2011 by Angelena Form, RN  Maintains optimal cardiac output and hemodynamic stability: Monitor blood pressure and heart rate  Taken 07/27/2021 1030 by Leanora Ivanoff,  RN  Maintains optimal cardiac output and hemodynamic stability: Monitor blood pressure and heart rate  Goal: Absence of cardiac dysrhythmias or at baseline  Outcome: Completed  Flowsheets (Taken 07/27/2021 2011)  Absence of cardiac dysrhythmias or at baseline: Monitor cardiac rate and rhythm     Problem: Infection - Adult  Goal: Absence of infection at discharge  Outcome: Completed  Flowsheets (Taken 07/27/2021 1030 by Leanora Ivanoff, RN)  Absence of infection at discharge: Assess and monitor for signs and symptoms of infection  Goal: Absence of infection during hospitalization  Outcome: Completed  Goal: Absence of fever/infection during anticipated neutropenic period  Outcome: Completed     Problem: Metabolic/Fluid and Electrolytes - Adult  Goal: Electrolytes maintained within normal limits  Outcome: Completed  Flowsheets (Taken 07/27/2021 1030 by Leanora Ivanoff, RN)  Electrolytes maintained within normal limits: Monitor labs and assess patient for signs and symptoms of electrolyte imbalances  Goal: Hemodynamic stability and optimal renal function maintained  Outcome: Completed  Goal: Glucose maintained within prescribed range  Outcome: Completed     Problem: Gastrointestinal - Adult  Goal: Maintains or returns to baseline bowel function  Outcome: Completed  Flowsheets (Taken 07/27/2021 1030 by Leanora Ivanoff, RN)  Maintains or returns to baseline bowel function: Assess bowel function  Goal: Maintains adequate nutritional intake  Outcome: Completed     Problem: Genitourinary - Adult  Goal: Absence of urinary retention  Outcome: Completed  Flowsheets (Taken 07/27/2021 1030 by Leanora Ivanoff, RN)  Absence of urinary retention: Assess patient's ability to void and empty bladder

## 2021-07-28 LAB — CULTURE, BODY FLUID

## 2021-07-29 LAB — CULTURE, BLOOD 1: Culture: NO GROWTH

## 2021-07-29 LAB — CULTURE, BLOOD 2: Culture: NO GROWTH

## 2021-07-31 LAB — CULTURE, BLOOD 1: Culture: NO GROWTH

## 2021-08-07 DIAGNOSIS — K50919 Crohn's disease, unspecified, with unspecified complications: Principal | ICD-10-CM

## 2021-08-09 DIAGNOSIS — K5090 Crohn's disease, unspecified, without complications: Secondary | ICD-10-CM | POA: Diagnosis not present

## 2021-08-09 DIAGNOSIS — R634 Abnormal weight loss: Secondary | ICD-10-CM | POA: Diagnosis not present

## 2021-08-09 DIAGNOSIS — R110 Nausea: Secondary | ICD-10-CM | POA: Diagnosis not present

## 2021-08-09 DIAGNOSIS — D72829 Elevated white blood cell count, unspecified: Secondary | ICD-10-CM | POA: Diagnosis not present

## 2021-08-09 DIAGNOSIS — Z09 Encounter for follow-up examination after completed treatment for conditions other than malignant neoplasm: Secondary | ICD-10-CM | POA: Diagnosis not present

## 2021-08-09 DIAGNOSIS — D469 Myelodysplastic syndrome, unspecified: Secondary | ICD-10-CM | POA: Diagnosis not present

## 2021-08-09 DIAGNOSIS — D75839 Thrombocytosis, unspecified: Secondary | ICD-10-CM | POA: Diagnosis not present

## 2021-08-11 NOTE — Unmapped (Signed)
07/27/2021 - 08/04/2021, admitted for right lower lobe pneumonia and empyema (Strep intermedius).  Treated with chest tube, VATS decortication.  Stop adalimumab and 6-MP.  Augmentin x4 weeks.    Plan:  -Continue to hold adalimumab and until completion of antibiotics  -Clinic with me in about 4 weeks to re-evaluate for suitability to resume Crohn's medications

## 2021-08-14 NOTE — Unmapped (Signed)
Patient given instructions to hold mercaptopurine and humira until follow up with Dr. Stevphen Rochester scheduled for 9/13 at 4pm in person at [facility] clinic. Patient verbalized understanding and in agreement with plan.

## 2021-08-15 ENCOUNTER — Emergency Department (HOSPITAL_COMMUNITY): Payer: Medicare Other

## 2021-08-15 ENCOUNTER — Encounter (HOSPITAL_COMMUNITY): Payer: Self-pay | Admitting: Emergency Medicine

## 2021-08-15 ENCOUNTER — Emergency Department: Payer: Self-pay

## 2021-08-15 ENCOUNTER — Inpatient Hospital Stay (HOSPITAL_COMMUNITY)
Admission: EM | Admit: 2021-08-15 | Discharge: 2021-08-22 | DRG: 177 | Disposition: A | Discharge status: Home / Self Care | Payer: Medicare Other | Attending: Internal Medicine | Admitting: Internal Medicine

## 2021-08-15 DIAGNOSIS — E8809 Other disorders of plasma-protein metabolism, not elsewhere classified: Secondary | ICD-10-CM | POA: Diagnosis present

## 2021-08-15 DIAGNOSIS — D849 Immunodeficiency, unspecified: Secondary | ICD-10-CM | POA: Diagnosis not present

## 2021-08-15 DIAGNOSIS — Z720 Tobacco use: Secondary | ICD-10-CM | POA: Diagnosis not present

## 2021-08-15 DIAGNOSIS — Z227 Latent tuberculosis: Secondary | ICD-10-CM

## 2021-08-15 DIAGNOSIS — F17210 Nicotine dependence, cigarettes, uncomplicated: Secondary | ICD-10-CM | POA: Diagnosis not present

## 2021-08-15 DIAGNOSIS — R0602 Shortness of breath: Secondary | ICD-10-CM | POA: Diagnosis not present

## 2021-08-15 DIAGNOSIS — J189 Pneumonia, unspecified organism: Secondary | ICD-10-CM | POA: Diagnosis present

## 2021-08-15 DIAGNOSIS — J9811 Atelectasis: Secondary | ICD-10-CM | POA: Diagnosis not present

## 2021-08-15 DIAGNOSIS — Z91018 Allergy to other foods: Secondary | ICD-10-CM | POA: Diagnosis not present

## 2021-08-15 DIAGNOSIS — F1120 Opioid dependence, uncomplicated: Secondary | ICD-10-CM | POA: Diagnosis present

## 2021-08-15 DIAGNOSIS — J869 Pyothorax without fistula: Secondary | ICD-10-CM | POA: Diagnosis not present

## 2021-08-15 DIAGNOSIS — R079 Chest pain, unspecified: Secondary | ICD-10-CM | POA: Diagnosis not present

## 2021-08-15 DIAGNOSIS — F1190 Opioid use, unspecified, uncomplicated: Secondary | ICD-10-CM | POA: Diagnosis not present

## 2021-08-15 DIAGNOSIS — K5090 Crohn's disease, unspecified, without complications: Secondary | ICD-10-CM | POA: Diagnosis present

## 2021-08-15 DIAGNOSIS — J939 Pneumothorax, unspecified: Secondary | ICD-10-CM | POA: Diagnosis not present

## 2021-08-15 DIAGNOSIS — R918 Other nonspecific abnormal finding of lung field: Secondary | ICD-10-CM | POA: Diagnosis not present

## 2021-08-15 DIAGNOSIS — K50918 Crohn's disease, unspecified, with other complication: Secondary | ICD-10-CM | POA: Diagnosis not present

## 2021-08-15 DIAGNOSIS — J45909 Unspecified asthma, uncomplicated: Secondary | ICD-10-CM | POA: Diagnosis not present

## 2021-08-15 DIAGNOSIS — R9431 Abnormal electrocardiogram [ECG] [EKG]: Secondary | ICD-10-CM | POA: Diagnosis not present

## 2021-08-15 DIAGNOSIS — F3181 Bipolar II disorder: Secondary | ICD-10-CM | POA: Diagnosis present

## 2021-08-15 DIAGNOSIS — Z87891 Personal history of nicotine dependence: Secondary | ICD-10-CM | POA: Diagnosis not present

## 2021-08-15 DIAGNOSIS — Z888 Allergy status to other drugs, medicaments and biological substances status: Secondary | ICD-10-CM

## 2021-08-15 DIAGNOSIS — J90 Pleural effusion, not elsewhere classified: Secondary | ICD-10-CM | POA: Diagnosis not present

## 2021-08-15 DIAGNOSIS — D649 Anemia, unspecified: Secondary | ICD-10-CM | POA: Diagnosis present

## 2021-08-15 DIAGNOSIS — Z716 Tobacco abuse counseling: Secondary | ICD-10-CM | POA: Diagnosis not present

## 2021-08-15 DIAGNOSIS — G894 Chronic pain syndrome: Secondary | ICD-10-CM | POA: Diagnosis not present

## 2021-08-15 DIAGNOSIS — J439 Emphysema, unspecified: Secondary | ICD-10-CM | POA: Diagnosis not present

## 2021-08-15 DIAGNOSIS — F419 Anxiety disorder, unspecified: Secondary | ICD-10-CM | POA: Diagnosis present

## 2021-08-15 DIAGNOSIS — A419 Sepsis, unspecified organism: Secondary | ICD-10-CM

## 2021-08-15 DIAGNOSIS — I700 Atherosclerosis of aorta: Secondary | ICD-10-CM | POA: Diagnosis not present

## 2021-08-15 DIAGNOSIS — F319 Bipolar disorder, unspecified: Secondary | ICD-10-CM | POA: Diagnosis not present

## 2021-08-15 DIAGNOSIS — R0600 Dyspnea, unspecified: Secondary | ICD-10-CM | POA: Diagnosis not present

## 2021-08-15 DIAGNOSIS — Z91013 Allergy to seafood: Secondary | ICD-10-CM

## 2021-08-15 DIAGNOSIS — R7401 Elevation of levels of liver transaminase levels: Secondary | ICD-10-CM | POA: Diagnosis not present

## 2021-08-15 DIAGNOSIS — J188 Other pneumonia, unspecified organism: Secondary | ICD-10-CM | POA: Diagnosis not present

## 2021-08-15 LAB — CBC WITH DIFFERENTIAL/PLATELET
Abs Immature Granulocytes: 0.08 10*3/uL — ABNORMAL HIGH (ref 0.00–0.07)
Basophils Absolute: 0.1 10*3/uL (ref 0.0–0.1)
Basophils Relative: 0 %
Eosinophils Absolute: 0.4 10*3/uL (ref 0.0–0.5)
Eosinophils Relative: 2 %
HCT: 30.5 % — ABNORMAL LOW (ref 39.0–52.0)
Hemoglobin: 10 g/dL — ABNORMAL LOW (ref 13.0–17.0)
Immature Granulocytes: 1 %
Lymphocytes Relative: 5 %
Lymphs Abs: 0.9 10*3/uL (ref 0.7–4.0)
MCH: 31.1 pg (ref 26.0–34.0)
MCHC: 32.8 g/dL (ref 30.0–36.0)
MCV: 94.7 fL (ref 80.0–100.0)
Monocytes Absolute: 0.8 10*3/uL (ref 0.1–1.0)
Monocytes Relative: 5 %
Neutro Abs: 14.2 10*3/uL — ABNORMAL HIGH (ref 1.7–7.7)
Neutrophils Relative %: 87 %
Platelets: 479 10*3/uL — ABNORMAL HIGH (ref 150–400)
RBC: 3.22 MIL/uL — ABNORMAL LOW (ref 4.22–5.81)
RDW: 16.9 % — ABNORMAL HIGH (ref 11.5–15.5)
WBC: 16.4 10*3/uL — ABNORMAL HIGH (ref 4.0–10.5)
nRBC: 0 % (ref 0.0–0.2)

## 2021-08-15 LAB — COMPREHENSIVE METABOLIC PANEL
ALT: 59 U/L — ABNORMAL HIGH (ref 0–44)
AST: 52 U/L — ABNORMAL HIGH (ref 15–41)
Albumin: 2.5 g/dL — ABNORMAL LOW (ref 3.5–5.0)
Alkaline Phosphatase: 116 U/L (ref 38–126)
Anion gap: 11 (ref 5–15)
BUN: 6 mg/dL (ref 6–20)
CO2: 25 mmol/L (ref 22–32)
Calcium: 8.6 mg/dL — ABNORMAL LOW (ref 8.9–10.3)
Chloride: 102 mmol/L (ref 98–111)
Creatinine, Ser: 0.65 mg/dL (ref 0.61–1.24)
GFR, Estimated: >60 mL/min (ref >60)
Glucose, Bld: 88 mg/dL (ref 70–99)
Potassium: 3.6 mmol/L (ref 3.5–5.1)
Sodium: 138 mmol/L (ref 135–145)
Total Bilirubin: 0.3 mg/dL (ref 0.3–1.2)
Total Protein: 6.7 g/dL (ref 6.5–8.1)

## 2021-08-15 LAB — HIV ANTIBODY (ROUTINE TESTING W REFLEX): HIV Screen 4th Generation wRfx: NONREACTIVE

## 2021-08-15 LAB — LACTIC ACID, PLASMA
Lactic Acid, Venous: 2 mmol/L (ref 0.5–1.9)
Lactic Acid, Venous: 1.9 mmol/L (ref 0.5–1.9)

## 2021-08-15 MED ORDER — SODIUM CHLORIDE 0.9 % IV BOLUS
1000.0000 mL | Freq: Once | INTRAVENOUS | Status: AC
  Administered 2021-08-15: 1000 mL via INTRAVENOUS

## 2021-08-15 MED ORDER — LAMOTRIGINE 100 MG PO TABS
200.0000 mg | ORAL_TABLET | Freq: Every day | ORAL | Status: DC
  Administered 2021-08-16 – 2021-08-22 (×7): 200 mg via ORAL
  Filled 2021-08-15 (×7): qty 2

## 2021-08-15 MED ORDER — TRAMADOL HCL 50 MG PO TABS
50.0000 mg | ORAL_TABLET | Freq: Three times a day (TID) | ORAL | Status: DC | PRN
  Administered 2021-08-15 – 2021-08-16 (×3): 50 mg via ORAL
  Filled 2021-08-15 (×4): qty 1

## 2021-08-15 MED ORDER — VANCOMYCIN HCL 1250 MG/250ML IV SOLN
1250.0000 mg | Freq: Once | INTRAVENOUS | Status: AC
  Administered 2021-08-15: 1250 mg via INTRAVENOUS
  Filled 2021-08-15: qty 250

## 2021-08-15 MED ORDER — VANCOMYCIN HCL 500 MG/100ML IV SOLN
500.0000 mg | Freq: Two times a day (BID) | INTRAVENOUS | Status: DC
Start: 2021-08-16 — End: 2021-08-20
  Administered 2021-08-16 – 2021-08-20 (×8): 500 mg via INTRAVENOUS
  Filled 2021-08-15 (×8): qty 100

## 2021-08-15 MED ORDER — HYDROXYZINE HCL 10 MG PO TABS
10.0000 mg | ORAL_TABLET | Freq: Three times a day (TID) | ORAL | Status: DC | PRN
  Administered 2021-08-16 – 2021-08-18 (×6): 10 mg via ORAL
  Filled 2021-08-15 (×6): qty 1

## 2021-08-15 MED ORDER — DIVALPROEX SODIUM ER 500 MG PO TB24
1000.0000 mg | ORAL_TABLET | Freq: Every day | ORAL | Status: DC
  Administered 2021-08-16 – 2021-08-21 (×6): 1000 mg via ORAL
  Filled 2021-08-15 (×6): qty 2

## 2021-08-15 MED ORDER — IOHEXOL 300 MG/ML  SOLN
75.0000 mL | Freq: Once | INTRAMUSCULAR | Status: AC | PRN
  Administered 2021-08-15: 75 mL via INTRAVENOUS

## 2021-08-15 MED ORDER — ENOXAPARIN SODIUM 40 MG/0.4ML IJ SOSY
40.0000 mg | PREFILLED_SYRINGE | INTRAMUSCULAR | Status: DC
Start: 2021-08-15 — End: 2021-08-20
  Administered 2021-08-15 – 2021-08-18 (×4): 40 mg via SUBCUTANEOUS
  Filled 2021-08-15 (×5): qty 0.4

## 2021-08-15 MED ORDER — ALBUTEROL SULFATE (2.5 MG/3ML) 0.083% IN NEBU: 3.0000 mL | INHALATION_SOLUTION | Freq: Four times a day (QID) | RESPIRATORY_TRACT | Status: DC | PRN

## 2021-08-15 MED ORDER — VANCOMYCIN HCL 500 MG/100ML IV SOLN
500.0000 mg | Freq: Two times a day (BID) | INTRAVENOUS | Status: DC
  Filled 2021-08-15: qty 100

## 2021-08-15 MED ORDER — NICOTINE 21 MG/24HR TD PT24
21.0000 mg | MEDICATED_PATCH | Freq: Every day | TRANSDERMAL | Status: DC
  Administered 2021-08-16 – 2021-08-22 (×8): 21 mg via TRANSDERMAL
  Filled 2021-08-15 (×8): qty 1

## 2021-08-15 MED ORDER — SODIUM CHLORIDE 0.9 % IV SOLN
2.0000 g | Freq: Once | INTRAVENOUS | Status: AC
  Administered 2021-08-15: 2 g via INTRAVENOUS
  Filled 2021-08-15: qty 12.5

## 2021-08-15 MED ORDER — METHOCARBAMOL 500 MG PO TABS
500.0000 mg | ORAL_TABLET | Freq: Three times a day (TID) | ORAL | Status: DC | PRN
  Administered 2021-08-16 (×2): 500 mg via ORAL
  Filled 2021-08-15 (×2): qty 1

## 2021-08-15 NOTE — Consult Note (Signed)
NAME:  Seth Foster, MRN:  [id number], DOB:  10/20/1980, LOS: 0 ADMISSION DATE:  08/15/2021, CONSULTATION DATE:  08/15/21 REFERRING MD:  Johnney Ou, CHIEF COMPLAINT:  cough, sob  History of Present Illness:   41 yo man with a hx of Crohns disease (required resections) on adalimumap and 6Mercaptopurine, Bipolar, substance abuse d/o , latent TB,  Here with shortness of breath.  Reports he had a pleural effusion and chest tube 1 month ago at a hospital in [place].  Reports she had antibiotics and VATS, d/c with augmentin 08/04/21.   Central and r sided chest pain since discharge but sob started 3 days ago.    +Fever, sweats, green productive cough.   Has been holding adalimumab and taking tramadol for pain.    Hypoalbuminemia AST/ALT slightly elevated  WBC 16.4 Blood cultures pending.   Pertinent  Medical History  Crohns disease Bipolar disease Substance abuse hx  Latent TB  Tobacco use   Depakote Lamictal Mecaptopurine on hold with empyema Folic Acid  H29 5htp serotonin booster Augmentin started 07/28. End date 08/28 Holding adalimumab  Albuterol Robaxin Tramadol Nicotine patch   SH lives in [place] 1ppd x 20 years.    Significant Hospital Events: Including procedures, antibiotic start and stop dates in addition to other pertinent events     Interim History / Subjective:    Objective   Blood pressure 123/78, pulse 63, temperature 98.5 F (36.9 C), resp. rate (!) 24, SpO2 98 %.        Intake/Output Summary (Last 24 hours) at 08/15/2021 2150 Last data filed at 08/15/2021 2141 Gross per 24 hour  Intake 1174.19 ml  Output --  Net 1174.19 ml   There were no vitals filed for this visit.  Examination: Sleeping comfortably, no distress, no tachypnea, on room air with sat 100%  Resolved Hospital Problem list     Assessment & Plan:  Complex appearing, loculated pleural effusion on R.   Recent VATS.   Given recent surgery and complex history,  recommend CT surgery consult with consideration for repeat vats vs chest tube.   Stable currently.    Best Practice (right click and "Reselect all SmartList Selections" daily)   Labs   CBC: Recent Labs  Lab 08/15/21 0859  WBC 16.4*  NEUTROABS 14.2*  HGB 10.0*  HCT 30.5*  MCV 94.7  PLT 479*    Basic Metabolic Panel: Recent Labs  Lab 08/15/21 0859  NA 138  K 3.6  CL 102  CO2 25  GLUCOSE 88  BUN 6  CREATININE 0.65  CALCIUM 8.6*   GFR: CrCl cannot be calculated (Unknown ideal weight.). Recent Labs  Lab 08/15/21 0859 08/15/21 1739  WBC 16.4*  --   LATICACIDVEN  --  2.0*    Liver Function Tests: Recent Labs  Lab 08/15/21 0859  AST 52*  ALT 59*  ALKPHOS 116  BILITOT 0.3  PROT 6.7  ALBUMIN 2.5*   No results for input(s): "LIPASE", "AMYLASE" in the last 168 hours. No results for input(s): "AMMONIA" in the last 168 hours.  ABG No results found for: "PHART", "PCO2ART", "PO2ART", "HCO3", "TCO2", "ACIDBASEDEF", "O2SAT"   Coagulation Profile: No results for input(s): "INR", "PROTIME" in the last 168 hours.  Cardiac Enzymes: No results for input(s): "CKTOTAL", "CKMB", "CKMBINDEX", "TROPONINI" in the last 168 hours.  HbA1C: No results found for: "HGBA1C"  CBG: No results for input(s): "GLUCAP" in the last 168 hours.  Review of Systems:     Past Medical History:  He,  has a past medical history of Asthma, Chronic pain syndrome, and Crohn disease ([place]).   Surgical History:   Past Surgical History:  Procedure Laterality Date   ABDOMINAL SURGERY     surgery for Crohns       Social History:   reports that he has been smoking cigarettes. He has a 8.00 pack-year smoking history. He has never used smokeless tobacco. He reports current alcohol use. He reports current drug use. Drug: Marijuana.   Family History:  His family history is not on file.   Allergies Allergies  Allergen Reactions   Benadryl [Diphenhydramine Hcl] Anaphylaxis   Broccoli  [Brassica Oleracea] Other (See Comments)    Causes terrible gas.    Scallops [Shellfish Allergy] Diarrhea and Nausea And Vomiting   Sulfa Antibiotics Hives   Tylenol [Acetaminophen] Nausea And Vomiting     Home Medications  Prior to Admission medications   Medication Sig Start Date End Date Taking? Authorizing Provider  acetaminophen (TYLENOL) 500 MG tablet Take 1,000 mg by mouth every 4 (four) hours as needed for moderate pain.   Yes [provider]  albuterol (PROVENTIL HFA;VENTOLIN HFA) 108 (90 BASE) MCG/ACT inhaler Inhale 2 puffs into the lungs every 6 (six) hours as needed for wheezing or shortness of breath. 12/27/14  Yes Leandrew Koyanagi, MD  amoxicillin-clavulanate (AUGMENTIN) 875-125 MG tablet Take 1 tablet by mouth 2 (two) times daily. 08/05/21  Yes [provider]  divalproex (DEPAKOTE ER) 500 MG 24 hr tablet Take 1,000 mg by mouth every evening. 07/12/21  Yes [provider]  ibuprofen (ADVIL) 200 MG tablet Take 600-800 mg by mouth every 4 (four) hours as needed for moderate pain.   Yes [provider]  lamoTRIgine (LAMICTAL) 200 MG tablet Take 200 mg by mouth every evening. 07/12/21  Yes [provider]  mercaptopurine (PURINETHOL) 50 MG tablet Take 75 mg by mouth daily. 07/18/21  Yes [provider]  methocarbamol (ROBAXIN) 500 MG tablet Take 500 mg by mouth every 6 (six) hours as needed for muscle spasms. 08/10/21  Yes [provider]  promethazine (PHENERGAN) 12.5 MG tablet Take 12.5 mg by mouth every 6 (six) hours as needed for nausea or vomiting. 03/20/21  Yes [provider]  traMADol (ULTRAM) 50 MG tablet Take 50 mg by mouth every 6 (six) hours as needed for moderate pain. 08/10/21  Yes [provider]  doxycycline (VIBRAMYCIN) 100 MG capsule Take 1 capsule (100 mg total) by mouth 2 (two) times daily. Patient not taking: Reported on 10/22/2019 08/29/15   Wardell Honour, MD  HUMIRA PEN 40 MG/0.4ML PNKT  Inject 40 mg into the skin every 14 (fourteen) days. 07/25/21   [provider]  HYDROCORTISONE ACE, RECTAL, 30 MG SUPP Place 1 suppository (30 mg total) rectally 2 (two) times daily. As directed Patient not taking: Reported on 10/22/2019 12/27/14   Leandrew Koyanagi, MD  hydrocortisone-pramoxine [facility]) rectal foam Place 1 applicator rectally 2 (two) times daily. Patient not taking: Reported on 10/22/2019 12/27/14   Leandrew Koyanagi, MD  hydrOXYzine (ATARAX/VISTARIL) 25 MG tablet Take 1 tablet (25 mg total) by mouth 3 (three) times daily as needed for anxiety. Patient not taking: Reported on 10/22/2019 08/29/15   Wardell Honour, MD  oxyCODONE (OXY IR/ROXICODONE) 5 MG immediate release tablet Take 1 tablet (5 mg total) by mouth every 6 (six) hours as needed for severe pain. Patient not taking: Reported on 10/22/2019 08/29/15   Wardell Honour, MD  PARoxetine (PAXIL) 20 MG tablet Take 1 tablet (20 mg total) by mouth daily. Patient not taking: Reported on 10/22/2019 07/27/15   Wardell Honour, MD  promethazine (PHENERGAN) 25 MG tablet Take 1 tablet (25 mg total) by mouth 3 (three) times daily. Patient not taking: Reported on 10/22/2019 08/29/15   Wardell Honour, MD  temazepam (RESTORIL) 15 MG capsule Take 1 capsule (15 mg total) by mouth daily. Patient not taking: Reported on 10/22/2019 07/27/15   Wardell Honour, MD  tiZANidine (ZANAFLEX) 4 MG tablet TAKE 1 TABLET (4 MG TOTAL) BY MOUTH EVERY 6 (SIX) HOURS AS NEEDED FOR MUSCLE SPASMS. Patient not taking: No sig reported 07/07/15   Wardell Honour, MD     Critical care time: 35 min

## 2021-08-15 NOTE — ED Provider Triage Note (Signed)
Emergency Medicine Provider Triage Evaluation Note  Seth Foster , a 41 y.o. male  was evaluated in triage.  Pt complains of shortness of breath.  Present and worsening over the past 3 days.  A few months ago patient had a pleural effusion, that was drained and found to be in empyema.  He reports he had been doing better until shortness of breath started to worsen about 3 days ago.  Reports some associated fatigue and occasional cough, no known fevers.  He has not started back on his immunologic medications for Crohn's yet since recent infection..  Review of Systems  Positive: Shortness of breath, fatigue Negative: Chest pain, fever  Physical Exam  BP 122/83 (BP Location: Right Arm)   Pulse 86   Temp 98.5 F (36.9 C) (Oral)   Resp (!) 26   SpO2 96%  Gen:   Awake, no distress   Resp:  Patient tachypneic but maintaining O2 sats on room air, some decreased breath sounds in bilateral lung fields, no wheezing. MSK:   Moves extremities without difficulty  Other:    Medical Decision Making  Medically screening exam initiated at 8:59 AM.  Appropriate orders placed.  Seth Foster was informed that the remainder of the evaluation will be completed by another provider, this initial triage assessment does not replace that evaluation, and the importance of remaining in the ED until their evaluation is complete.  We will get basic labs and chest x-ray to assess for potential reaccumulation of pleural effusion/empyema   Seth Foster, [place] 08/15/21 9449

## 2021-08-15 NOTE — ED Triage Notes (Signed)
Patient here with complaint of shortness of breath that started three months ago, reports having a pleural effusion and a drainage tube one month ago. Patient is alert, oriented, speaking in complete sentences, and is in no apparent distress at this time.

## 2021-08-15 NOTE — Progress Notes (Addendum)
Pharmacy Antibiotic Note  Seth Foster is a 41 y.o. male for which pharmacy has been consulted for vancomycin dosing for sepsis.  Patient with a history of crohn's disease s/p resection, recent hospitalization for empyema requiring chest tube and VATS. Patient presenting with SOB.  Started on augmentin 7/28 (planned through 8/28)  SCr 0.65 WBC 16.4; T 97.8 F; HR 72; RR 25  Plan: Cefepime per MD (one-time dose given in ED) Vancomycin 1250 mg once then 500 mg q12hr (eAUC 497) unless change in renal function Trend WBC, Fever, Renal function, & Clinical course F/u cultures, clinical course, WBC, fever De-escalate when able Levels at steady state     Temp (24hrs), Avg:98.2 F (36.8 C), Min:97.8 F (36.6 C), Max:98.5 F (36.9 C)  Recent Labs  Lab 08/15/21 0859  WBC 16.4*  CREATININE 0.65    CrCl cannot be calculated (Unknown ideal weight.).    Allergies  Allergen Reactions   Benadryl [Diphenhydramine Hcl] Anaphylaxis   Broccoli [Brassica Oleracea] Other (See Comments)    Causes terrible gas.    Scallops [Shellfish Allergy] Diarrhea and Nausea And Vomiting   Sulfa Antibiotics Hives   Tylenol [Acetaminophen] Nausea And Vomiting    Antimicrobials this admission: cefepime 8/8 >>   vancomycin 8/8 >>  Microbiology results: Pending  Thank you for allowing pharmacy to be a part of this patient's care.  Lorelei Pont, PharmD, BCPS 08/15/2021 4:46 PM ED Clinical Pharmacist -  [phone]

## 2021-08-15 NOTE — H&P (Cosign Needed Addendum)
Date: 08/15/2021               Patient Name:  Seth Foster MRN: [id number]  DOB: 12/18/1980 Age / Sex: 41 y.o., male   PCP: Gustavus Bryant, PA-C         Medical Service: Internal Medicine Teaching Service         Attending Physician: Dr. 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  Since being discharged, he endorses central and right sided chest wall pain but was not having difficulty breathing until three days ago. The pain is worse when taking deep breaths. Felt febrile with sweats the last two evenings. Green productive cough. Denies n/v/d. Does endorse some abdominal pain and pain of right chest wall where tubes were placed. Was evaluated by his PCP after discharge and was told his right breath wounds were decreased on pulmonary exam.   Prior to [place] lung issues or procedures. Was instructed to hold adalimumab and 6MP crohn's therapy while being treated for his empyema and he was adherent to this. He was taking tramadol for the pain. He endorses history of opioid use disorder and remote history of injecting morphine. Opioids were used to help control his anxiety. He was on suboxone in the past but did not find it managed his anxiety well.   Meds:  Depakote Lamictal Mecaptopurine on hold with  empyema Folic Acid  BS015htp serotonin booster Augmentin started 07/28. End date 08/28 Holding adalimumab and 6-MP Albuterol Robaxin Tramadol  Past Medical History Crohn's disease Bipolar Type  Opioid Use disorder  Past Surgical History ADENOIDECTOMY Bilateral   COLON SURGERY   eustachian Bilateral   ostomy reversal   PR COLONOSCOPY FLX DX W/COLLJ SPEC WHEN PFRMD N/A 02/20/2013  Procedure: COLONOSCOPY, FLEXIBLE, PROXIMAL TO SPLENIC FLEXURE; DIAGNOSTIC, W/WO COLLECTION SPECIMEN BY [place] Surgeon: JFritz Pickerel MD; [facility] Service: [facility]   PR [place]IL[place]AL LES[place] SIMPLE N/A 05/01/2021  Procedure: EXCISION OF PILONIDAL CYST OR SINUS; SIMPLE; Surgeon: MUrban Gibson MD; [place] [facility] [place] Service: Gastrointestinal   PR SURG DIAGNOSTIC EXAM, ANORECTAL N/A 05/01/2021  Procedure: ANORECTAL EXAM, SURGICAL, REQUIRING ANESTHESIA (GENERAL, SPINAL, OR EPIDURAL), DIAGNOSTIC; Surgeon: MUrban Gibson MD; Location: MAIN OR [place] Service: Gastrointestinal   TONSILLECTOMY   Social:  [place] occupied currently.  Support: Family in the area Level of Function: Able to perform ADL/IADL PCP: JJule SerPA Substances: Tobacco 1ppd for 20 years, quit 2 months ago. Uses nicotine patches. Denies alcohol use. Hx of opioid use disorder and injecting morphine. Tried suboxone in the past, but did not feel it was effective. Trialed on xanax and thought it was more helpful.   Family History:  Denies family  history of MI, CVA, Malignancy  Allergies: Allergies as of 08/15/2021 - Review Complete 08/15/2021  Allergen Reaction Noted   Benadryl [diphenhydramine hcl] Anaphylaxis 06/04/2011   Broccoli [brassica oleracea] Other (See Comments) 06/04/2011   Scallops [shellfish allergy] Diarrhea and Nausea And Vomiting 06/04/2011   Sulfa antibiotics Hives 06/04/2011   Tylenol [acetaminophen]  Nausea And Vomiting 06/04/2011   Review of Systems: A complete ROS was negative except as per HPI.   OBJECTIVE:   Physical Exam: Blood pressure 100/61, pulse 83, temperature 97.8 F (36.6 C), temperature source Oral, resp. rate (!) 33, SpO2 96 %.  Constitutional: uncomfortable appearing HENT: normocephalic atraumatic, mucous membranes dry Eyes: conjunctiva non-erythematous Neck: supple Cardiovascular: regular rate and rhythm, no m/r/g Pulmonary/Chest: normal work of breathing on room air, decreased breath sounds right lung base. Abdominal: soft, non-distended, tender to palpation epigastric and RUQ MSK: Chest wall tender to palpation Neurological: alert & oriented x 3 Skin: warm and dry. 2 well healing 2cm incision sites right chest wall.  Psych: normal mood  Labs: CBC    Component Value Date/Time   WBC 16.4 (H) 08/15/2021 0859   RBC 3.22 (L) 08/15/2021 0859   HGB 10.0 (L) 08/15/2021 0859   HCT 30.5 (L) 08/15/2021 0859   PLT 479 (H) 08/15/2021 0859   MCV 94.7 08/15/2021 0859   MCV 85.9 08/17/2015 1738   MCH 31.1 08/15/2021 0859   MCHC 32.8 08/15/2021 0859   RDW 16.9 (H) 08/15/2021 0859   LYMPHSABS 0.9 08/15/2021 0859   MONOABS 0.8 08/15/2021 0859   EOSABS 0.4 08/15/2021 0859   BASOSABS 0.1 08/15/2021 0859     CMP     Component Value Date/Time   NA 138 08/15/2021 0859   K 3.6 08/15/2021 0859   CL 102 08/15/2021 0859   CO2 25 08/15/2021 0859   GLUCOSE 88 08/15/2021 0859   BUN 6 08/15/2021 0859   CREATININE 0.65 08/15/2021 0859   CREATININE 0.79 08/17/2015 1726   CALCIUM 8.6 (L) 08/15/2021 0859   PROT 6.7 08/15/2021 0859   ALBUMIN 2.5 (L) 08/15/2021 0859   AST 52 (H) 08/15/2021 0859   ALT 59 (H) 08/15/2021 0859   ALKPHOS 116 08/15/2021 0859   BILITOT 0.3 08/15/2021 0859   GFRNONAA >60 08/15/2021 0859   GFRAA >90 06/04/2011 1300    Imaging:  Chest X-ray  Bilateral opacities with pleural and parenchymal densities in right lung base  CT Chest Moderate  size multiloculated right pleural effusion with internal gas and peripheral contrast enhancement, suspicious for empyema.   Patchy airspace opacity throughout the left lung, greatest in the left lower lobe, most likely representing an infectious or inflammatory etiology.   Mild mediastinal and bilateral supraclavicular lymphadenopathy, which is nonspecific and may be reactive in etiology. Recommend continued attention on follow-up imaging.   EKG: personally reviewed my interpretation is normal sinus rhythm. S wave in 1, q wave and inverted T in 3. Normal axis. No ST wave changes. S1q3t3 different from prior EKG in 2021.   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 Presents to [facility] with worsening chest wall pain and dyspnea found to have a moderate right sided empyema and admitted for IV antibiotics and possible surgical intervention.   Right sided empyema Multifocal pneumonia Recent admission in [place] for right sided empyema treated with chest tub, VATS and antibiotics. He was discharged home with Augmentin with plan to complete on 09/04/2021. He has noticed worsening chest pain worsened with deep breath. Imaging consistent with recurrent empyema and multifocal pneumonia. Leukocytosis and elevated lactic acid. Hemodynamically stable and not septic. Will start antibiotics and consult pulmonology. With size of empyema, suspect he will need repeat thoracostomy tube placement -continue vancomycin and  cefepime day 1 -PCCM consulted, appreciate their assistance -trend wbc -blood cultures pending -robaxin and tramadol for chest wall pain -attempt to obtain culture results from [facility] to trend and if RUQ pain or worsening labs, consider RUQ [place]  EKG Changes Prior Ekg from 2021, new findings QRS findings in lead 1/3 and t wave inversions in 3. Possible secondary to severe pulmonary infection. Will continue to monitor for hemodynamic changes, EKG pending  Crohn's disease Recently seen by his gastroenterologist. Will continue to hold adalimumab and 21m in setting of empyema.   Bipolar disorder Continue Depakote and Lamictal  Opioid use disorder Currently working with PCP and psychiatrist to find medical therapies. Trialed on suboxone and did not find useful, felt xanax helped more. Driving factor is increased anxiety.  -continue to follow up with PCP -tramadol and robaxin for pain  Hx of positive quantiferon-TB Negative follow up x2, consistent with latent TB.   Tobacco use disorder Nicotine patch daily  Diet: Normal VTE: Enoxaparin IVF: None,None Code: Full  Prior to Admission Living Arrangement: Home, living parents Anticipated Discharge Location: Home  Dispo: Admit patient to Inpatient with expected length of stay greater than 2 midnights.  Signed: KRiesa Pope MD Internal Medicine Resident PGY-3  08/15/2021, 8:19 PM

## 2021-08-15 NOTE — ED Provider Notes (Signed)
[place] EMERGENCY DEPARTMENT Provider Note   CSN: [id number] Arrival date & time: 08/15/21  0848     History  Chief Complaint  Patient presents with   Shortness of Breath    Seth Foster is a 41 y.o. male.  Patient is a 41 year old male with past medical history of Crohn's disease, chronic pain syndrome, and asthma presenting for sob. Pt went to [facility] in [place] on July 16th for sob with dx of pleural effusion. Pt was then transferred to [facility] where he received a pigtail catheter for drainage. Pt states "I was told the pleural effusion was due to an infection of the lung. Called it a sister of strep bacteria". Pt was discharged on July 29th after pigtail removed. Discharged home on Augmentin and still taking. States he was feeling well at the time of discharge. Pt presenting today for shortness of breath x 3 days. Able to stand and walk but endorses significant sob with ambulating and worse when sitting up straight. Denies leg swelling. States he felt warm last night but did not take his temperature. Took Tylenol 1 g last night. No fever on arrival. Admits to minimally productive cough.   CXR today shows right sided pleural effusion. Pt presented with CD's from OSH. Will have technicians upload to compare.   The history is provided by the patient. No language interpreter was used.  Shortness of Breath Associated symptoms: cough   Associated symptoms: no abdominal pain, no chest pain, no ear pain, no fever, no rash, no sore throat and no vomiting        Home Medications Prior to Admission medications   Medication Sig Start Date End Date Taking? Authorizing Provider  acetaminophen (TYLENOL) 500 MG tablet Take 1,000 mg by mouth every 4 (four) hours as needed for moderate pain.   Yes [provider]  albuterol (PROVENTIL HFA;VENTOLIN HFA) 108 (90 BASE) MCG/ACT inhaler Inhale 2 puffs into the lungs every 6 (six) hours as needed  for wheezing or shortness of breath. 12/27/14  Yes Leandrew Koyanagi, MD  amoxicillin-clavulanate (AUGMENTIN) 875-125 MG tablet Take 1 tablet by mouth 2 (two) times daily. 08/05/21  Yes [provider]  divalproex (DEPAKOTE ER) 500 MG 24 hr tablet Take 1,000 mg by mouth every evening. 07/12/21  Yes [provider]  ibuprofen (ADVIL) 200 MG tablet Take 600-800 mg by mouth every 4 (four) hours as needed for moderate pain.   Yes [provider]  lamoTRIgine (LAMICTAL) 200 MG tablet Take 200 mg by mouth every evening. 07/12/21  Yes [provider]  mercaptopurine (PURINETHOL) 50 MG tablet Take 75 mg by mouth daily. 07/18/21  Yes [provider]  methocarbamol (ROBAXIN) 500 MG tablet Take 500 mg by mouth every 6 (six) hours as needed for muscle spasms. 08/10/21  Yes [provider]  promethazine (PHENERGAN) 12.5 MG tablet Take 12.5 mg by mouth every 6 (six) hours as needed for nausea or vomiting. 03/20/21  Yes [provider]  traMADol (ULTRAM) 50 MG tablet Take 50 mg by mouth every 6 (six) hours as needed for moderate pain. 08/10/21  Yes [provider]  doxycycline (VIBRAMYCIN) 100 MG capsule Take 1 capsule (100 mg total) by mouth 2 (two) times daily. Patient not taking: Reported on 10/22/2019 08/29/15   Wardell Honour, MD  HUMIRA PEN 40 MG/0.4ML PNKT Inject 40 mg into the skin every 14 (fourteen) days. 07/25/21   [provider]  HYDROCORTISONE ACE, RECTAL, 30  MG SUPP Place 1 suppository (30 mg total) rectally 2 (two) times daily. As directed Patient not taking: Reported on 10/22/2019 12/27/14   Leandrew Koyanagi, MD  hydrocortisone-pramoxine [facility]) rectal foam Place 1 applicator rectally 2 (two) times daily. Patient not taking: Reported on 10/22/2019 12/27/14   Leandrew Koyanagi, MD  hydrOXYzine (ATARAX/VISTARIL) 25 MG tablet Take 1 tablet (25 mg total) by mouth 3 (three) times daily as needed for anxiety. Patient not  taking: Reported on 10/22/2019 08/29/15   Wardell Honour, MD  oxyCODONE (OXY IR/ROXICODONE) 5 MG immediate release tablet Take 1 tablet (5 mg total) by mouth every 6 (six) hours as needed for severe pain. Patient not taking: Reported on 10/22/2019 08/29/15   Wardell Honour, MD  PARoxetine (PAXIL) 20 MG tablet Take 1 tablet (20 mg total) by mouth daily. Patient not taking: Reported on 10/22/2019 07/27/15   Wardell Honour, MD  promethazine (PHENERGAN) 25 MG tablet Take 1 tablet (25 mg total) by mouth 3 (three) times daily. Patient not taking: Reported on 10/22/2019 08/29/15   Wardell Honour, MD  temazepam (RESTORIL) 15 MG capsule Take 1 capsule (15 mg total) by mouth daily. Patient not taking: Reported on 10/22/2019 07/27/15   Wardell Honour, MD  tiZANidine (ZANAFLEX) 4 MG tablet TAKE 1 TABLET (4 MG TOTAL) BY MOUTH EVERY 6 (SIX) HOURS AS NEEDED FOR MUSCLE SPASMS. Patient not taking: No sig reported 07/07/15   Wardell Honour, MD      Allergies    Benadryl [diphenhydramine hcl], Sulfa antibiotics, Broccoli [brassica oleracea], and Scallops [shellfish allergy]    Review of Systems   Review of Systems  Constitutional:  Negative for chills and fever.  HENT:  Negative for ear pain and sore throat.   Eyes:  Negative for pain and visual disturbance.  Respiratory:  Positive for cough and shortness of breath.   Cardiovascular:  Negative for chest pain and palpitations.  Gastrointestinal:  Negative for abdominal pain and vomiting.  Genitourinary:  Negative for dysuria and hematuria.  Musculoskeletal:  Negative for arthralgias and back pain.  Skin:  Negative for color change and rash.  Neurological:  Negative for seizures and syncope.  All other systems reviewed and are negative.   Physical Exam Updated Vital Signs BP 110/63 (BP Location: Right Arm)   Pulse 67   Temp 97.9 F (36.6 C) (Tympanic)   Resp (!) 22   Ht '5\' 11"'$  (1.803 m)   Wt 62.4 kg   SpO2 100%   BMI 19.19 kg/m  Physical  Exam Vitals and nursing note reviewed.  Constitutional:      General: He is not in acute distress.    Appearance: He is well-developed.  HENT:     Head: Normocephalic and atraumatic.  Eyes:     Conjunctiva/sclera: Conjunctivae normal.  Cardiovascular:     Rate and Rhythm: Normal rate and regular rhythm.     Heart sounds: No murmur heard. Pulmonary:     Effort: Pulmonary effort is normal. Tachypnea present. No respiratory distress.     Breath sounds: Rales present.  Abdominal:     Palpations: Abdomen is soft.     Tenderness: There is no abdominal tenderness.  Musculoskeletal:        General: No swelling.     Cervical back: Neck supple.  Skin:    General: Skin is warm and dry.     Capillary Refill: Capillary refill takes less than 2 seconds.       Neurological:  Mental Status: He is alert.  Psychiatric:        Mood and Affect: Mood normal.     ED Results / Procedures / Treatments   Labs (all labs ordered are listed, but only abnormal results are displayed) Labs Reviewed  COMPREHENSIVE METABOLIC PANEL - Abnormal; Notable for the following components:      Result Value   Calcium 8.6 (*)    Albumin 2.5 (*)    AST 52 (*)    ALT 59 (*)    All other components within normal limits  CBC WITH DIFFERENTIAL/PLATELET - Abnormal; Notable for the following components:   WBC 16.4 (*)    RBC 3.22 (*)    Hemoglobin 10.0 (*)    HCT 30.5 (*)    RDW 16.9 (*)    Platelets 479 (*)    Neutro Abs 14.2 (*)    Abs Immature Granulocytes 0.08 (*)    All other components within normal limits  LACTIC ACID, PLASMA - Abnormal; Notable for the following components:   Lactic Acid, Venous 2.0 (*)    All other components within normal limits  CBC - Abnormal; Notable for the following components:   WBC 11.2 (*)    RBC 2.87 (*)    Hemoglobin 8.8 (*)    HCT 26.4 (*)    RDW 16.7 (*)    Platelets 403 (*)    All other components within normal limits  BASIC METABOLIC PANEL - Abnormal; Notable  for the following components:   Potassium 3.2 (*)    Calcium 8.2 (*)    All other components within normal limits  HEPATIC FUNCTION PANEL - Abnormal; Notable for the following components:   Total Protein 6.0 (*)    Albumin 2.3 (*)    AST 74 (*)    ALT 82 (*)    All other components within normal limits  CBC - Abnormal; Notable for the following components:   RBC 2.66 (*)    Hemoglobin 8.2 (*)    HCT 25.0 (*)    RDW 16.8 (*)    All other components within normal limits  COMPREHENSIVE METABOLIC PANEL - Abnormal; Notable for the following components:   Potassium 3.4 (*)    Chloride 113 (*)    CO2 20 (*)    Calcium 7.8 (*)    Total Protein 5.4 (*)    Albumin 2.1 (*)    ALT 68 (*)    All other components within normal limits  COMPREHENSIVE METABOLIC PANEL - Abnormal; Notable for the following components:   Potassium 3.4 (*)    Chloride 112 (*)    CO2 20 (*)    Calcium 7.7 (*)    Total Protein 5.6 (*)    Albumin 2.2 (*)    ALT 56 (*)    All other components within normal limits  CBC - Abnormal; Notable for the following components:   RBC 2.85 (*)    Hemoglobin 8.9 (*)    HCT 26.4 (*)    RDW 16.7 (*)    All other components within normal limits  CBC - Abnormal; Notable for the following components:   RBC 2.72 (*)    Hemoglobin 8.4 (*)    HCT 25.3 (*)    RDW 17.1 (*)    All other components within normal limits  COMPREHENSIVE METABOLIC PANEL - Abnormal; Notable for the following components:   Chloride 115 (*)    CO2 21 (*)    Calcium 7.9 (*)    Total  Protein 5.3 (*)    Albumin 2.2 (*)    AST 12 (*)    Anion gap 4 (*)    All other components within normal limits  VANCOMYCIN, PEAK - Abnormal; Notable for the following components:   Vancomycin Pk 4 (*)    All other components within normal limits  CBC WITH DIFFERENTIAL/PLATELET - Abnormal; Notable for the following components:   RBC 2.75 (*)    Hemoglobin 8.5 (*)    HCT 26.3 (*)    RDW 17.4 (*)    All other  components within normal limits  BASIC METABOLIC PANEL - Abnormal; Notable for the following components:   CO2 19 (*)    Calcium 8.1 (*)    All other components within normal limits  VANCOMYCIN, PEAK - Abnormal; Notable for the following components:   Vancomycin Pk 16 (*)    All other components within normal limits  CBC - Abnormal; Notable for the following components:   RBC 2.75 (*)    Hemoglobin 8.5 (*)    HCT 25.8 (*)    RDW 17.5 (*)    Platelets 402 (*)    All other components within normal limits  BASIC METABOLIC PANEL - Abnormal; Notable for the following components:   CO2 20 (*)    Glucose, Bld 100 (*)    Calcium 8.1 (*)    All other components within normal limits  CULTURE, BLOOD (ROUTINE X 2)  CULTURE, BLOOD (ROUTINE X 2)  BODY FLUID CULTURE W GRAM STAIN  MRSA NEXT GEN BY PCR, NASAL  MRSA NEXT GEN BY PCR, NASAL  LACTIC ACID, PLASMA  HIV ANTIBODY (ROUTINE TESTING W REFLEX)  RAPID URINE DRUG SCREEN, HOSP PERFORMED    EKG None  Radiology DG CHEST PORT 1 VIEW  Result Date: 08/21/2021 CLINICAL DATA:  Chest pain and dyspnea. EXAM: PORTABLE CHEST 1 VIEW COMPARISON:  August 20, 2021 (3:27 p.m.) FINDINGS: The heart size and mediastinal contours are within normal limits. Stable mild to moderate severity right basilar atelectasis and/or infiltrate is seen. The left lung is clear. There is a small, stable right pleural effusion. No pneumothorax is identified. The visualized skeletal structures are unremarkable. IMPRESSION: 1. Stable mild to moderate severity right basilar atelectasis and/or infiltrate. 2. Small, stable right pleural effusion. Electronically Signed   By: Virgina Norfolk M.D.   On: 08/21/2021 02:54   DG CHEST PORT 1 VIEW  Result Date: 08/20/2021 CLINICAL DATA:  Chest tube removal EXAM: PORTABLE CHEST 1 VIEW COMPARISON:  Chest x-ray 08/20/2021.  CT chest 08/19/2021. FINDINGS: Right chest tube has been removed. Small right pleural effusion with a few punctate foci  of air appears unchanged from prior. Left lung is clear. Cardiomediastinal silhouette is within normal limits. Osseous structures are stable. IMPRESSION: 1. Right chest tube has been removed. 2. Small right hydropneumothorax appears unchanged. Electronically Signed   By: Ronney Asters M.D.   On: 08/20/2021 16:48   DG CHEST PORT 1 VIEW  Result Date: 08/20/2021 CLINICAL DATA:  Empyema EXAM: PORTABLE CHEST 1 VIEW COMPARISON:  Prior chest x-ray 08/18/2021 FINDINGS: Pigtail thoracostomy tube projects over the right lung base. Persistent pleural thickening and chronic atelectasis. Cardiac and mediastinal contours remain unchanged. Stable patchy airspace opacities throughout the left lower lobe. IMPRESSION: Stable chest x-ray without significant interval change. Unchanged position of right basilar chest tube. Electronically Signed   By: Jacqulynn Cadet M.D.   On: 08/20/2021 07:07    Procedures .Critical Care  Performed by: Lianne Cure, DO  Authorized by: Lianne Cure, DO   Critical care provider statement:    Critical care time (minutes):  75   Critical care was necessary to treat or prevent imminent or life-threatening deterioration of the following conditions:  Sepsis   Critical care was time spent personally by me on the following activities:  Development of treatment plan with patient or surrogate, discussions with consultants, evaluation of patient's response to treatment, examination of patient, ordering and review of laboratory studies, ordering and review of radiographic studies, ordering and performing treatments and interventions, pulse oximetry, re-evaluation of patient's condition and review of old charts     Medications Ordered in ED Medications  albuterol (PROVENTIL) (2.5 MG/3ML) 0.083% nebulizer solution 3 mL ( Inhalation MAR Unhold 08/16/21 1557)  divalproex (DEPAKOTE ER) 24 hr tablet 1,000 mg (1,000 mg Oral Given 08/20/21 1614)  lamoTRIgine (LAMICTAL) tablet 200 mg (200 mg Oral  Given 08/21/21 0923)  nicotine (NICODERM CQ - dosed in mg/24 hours) patch 21 mg (21 mg Transdermal Patch Applied 08/21/21 0923)  lidocaine (LIDODERM) 5 % 1 patch (1 patch Transdermal Patch Removed 08/21/21 0925)  guaiFENesin-dextromethorphan (ROBITUSSIN DM) 100-10 MG/5ML syrup 5 mL (5 mLs Oral Given 08/21/21 0150)  simethicone (MYLICON) chewable tablet 80 mg (80 mg Oral Given 08/20/21 1342)  ceFEPIme (MAXIPIME) 2 g in sodium chloride 0.9 % 100 mL IVPB (2 g Intravenous New Bag/Given 08/21/21 0512)  acetaminophen (TYLENOL) tablet 1,000 mg (1,000 mg Oral Given 08/20/21 1850)  sodium chloride flush (NS) 0.9 % injection 10 mL (10 mLs Intrapleural Not Given 08/21/21 0822)  ondansetron (ZOFRAN) tablet 4 mg (4 mg Oral Given 08/19/21 1430)  traMADol (ULTRAM) tablet 50 mg (50 mg Oral Given 08/21/21 0922)  ketorolac (TORADOL) 15 MG/ML injection 15 mg (15 mg Intravenous Given 08/21/21 0508)  methocarbamol (ROBAXIN) tablet 1,000 mg (1,000 mg Oral Given 08/21/21 5176)  oxyCODONE (Oxy IR/ROXICODONE) immediate release tablet 5-10 mg (10 mg Oral Given 08/21/21 0558)  feeding supplement (ENSURE ENLIVE / ENSURE PLUS) liquid 237 mL (237 mLs Oral Given 08/20/21 1329)  hydrOXYzine (ATARAX) tablet 10 mg (10 mg Oral Given 08/21/21 0922)  vancomycin (VANCOCIN) IVPB 1000 mg/200 mL premix (1,000 mg Intravenous New Bag/Given 08/21/21 0634)  calcium carbonate (TUMS - dosed in mg elemental calcium) chewable tablet 200 mg of elemental calcium (200 mg of elemental calcium Oral Given 08/21/21 0558)  ALPRAZolam Duanne Moron) tablet 0.25 mg (0.25 mg Oral Given 08/20/21 1328)  ALPRAZolam (XANAX) tablet 0.5 mg (0.5 mg Oral Given 08/20/21 2357)  rivaroxaban (XARELTO) tablet 10 mg (10 mg Oral Given 08/21/21 0922)  diclofenac Sodium (VOLTAREN) 1 % topical gel 4 g (4 g Topical Given 08/20/21 2208)  iohexol (OMNIPAQUE) 300 MG/ML solution 75 mL (75 mLs Intravenous Contrast Given 08/15/21 1615)  ceFEPIme (MAXIPIME) 2 g in sodium chloride 0.9 % 100 mL IVPB (0 g  Intravenous Stopped 08/15/21 2019)  sodium chloride 0.9 % bolus 1,000 mL (0 mLs Intravenous Stopped 08/15/21 2019)  vancomycin (VANCOREADY) IVPB 1250 mg/250 mL (0 mg Intravenous Stopped 08/15/21 2141)  vancomycin (VANCOREADY) IVPB 1250 mg/250 mL (0 mg Intravenous Stopped 08/15/21 2318)  potassium chloride SA (KLOR-CON M) CR tablet 40 mEq (40 mEq Oral Given 08/16/21 0828)  ondansetron (ZOFRAN) tablet 4 mg (4 mg Oral Given 08/16/21 1314)  alteplase (CATHFLO ACTIVASE) 10 mg in sodium chloride (PF) 0.9 % 30 mL (10 mg Intrapleural Given by Other 08/16/21 1700)    And  dornase alfa (PULMOZYME) 5 mg in sterile water (preservative free) 30 mL (5 mg Intrapleural  Given by Other 08/16/21 1700)  alteplase (CATHFLO ACTIVASE) 10 mg in sodium chloride (PF) 0.9 % 30 mL (10 mg Intrapleural Given by Other 08/17/21 1125)    And  dornase alfa (PULMOZYME) 5 mg in sterile water (preservative free) 30 mL (5 mg Intrapleural Given by Other 08/17/21 1125)  alteplase (CATHFLO ACTIVASE) 10 mg in sodium chloride (PF) 0.9 % 30 mL (10 mg Intrapleural Given 08/18/21 1530)    And  dornase alfa (PULMOZYME) 5 mg in sterile water (preservative free) 30 mL (5 mg Intrapleural Given 08/18/21 1530)    ED Course/ Medical Decision Making/ A&P Clinical Course as of 08/21/21 1100  Tue Aug 15, 2021  1638 Concerns for sepsis due to tachypnea and leukocytosis.  Would qualify for help with spittle acquired pneumonia.  Cefepime and vancomycin ordered.  Blood cultures and lactic acid ordered.  Liter IV fluid ordered.  Pending CT results. [AG]    Clinical Course User Index [AG] Lianne Cure, DO                           Medical Decision Making Amount and/or Complexity of Data Reviewed Labs: ordered. Radiology: ordered.  Risk Prescription drug management. Decision regarding hospitalization.   56:61 AM 41 year old male with past medical history of Crohn's disease, chronic pain syndrome, and asthma presenting for sob. Pt is Aox3, no acute distress,  tachypenic, and afebrile. No hypoxia. Rales ausculted. Pt otherwise appears thin.   I independently interpreted patient's labs. Laboratory studies concerning for leukocytosis of 16.4. Pt now has 2/4 SIRS criteria due to tachypnea and qualifies for sepsis. IV fluids and broad spectrum antibiotics started. CXR concerning for pleural effusions and lung densities. CT chest ordered and pending. High likelihood or repeat empyema.   Patient signed out to oncoming provider Dr. Alvino Chapel while awaiting CT chest. Plan for admission for sepsis and lung infection.         Final Clinical Impression(s) / ED Diagnoses Final diagnoses:  Empyema ([place])  Sepsis, due to unspecified organism, unspecified whether acute organ dysfunction present [facility])    Rx / DC Orders ED Discharge Orders     None         Lianne Cure, DO 73/22/02 1100

## 2021-08-15 NOTE — ED Provider Notes (Signed)
  Physical Exam  BP 100/61   Pulse 83   Temp 97.6 F (36.4 C) (Axillary)   Resp (!) 33   SpO2 96%   Physical Exam  Procedures  Procedures  ED Course / MDM   Clinical Course as of 08/15/21 1806  Tue Aug 15, 2021  1638 Concerns for sepsis due to tachypnea and leukocytosis.  Would qualify for help with spittle acquired pneumonia.  Cefepime and vancomycin ordered.  Blood cultures and lactic acid ordered.  Liter IV fluid ordered.  Pending CT results. [AG]    Clinical Course User Index [AG] Lianne Cure, DO   Medical Decision Making Amount and/or Complexity of Data Reviewed Labs: ordered. Radiology: ordered.  Risk Prescription drug management. Decision regarding hospitalization.   Received patient in signout.  CT scan independently interpreted shows empyema on the right side.  Has had recent empyema and had previous VATS and chest tube.  White count elevated.  Antibiotics have been given.  Will admit to internal medicine residents.  Does not appear septic at this time.       Davonna Belling, MD 08/15/21 1806

## 2021-08-16 ENCOUNTER — Inpatient Hospital Stay (HOSPITAL_COMMUNITY): Payer: Medicare Other

## 2021-08-16 ENCOUNTER — Encounter (HOSPITAL_COMMUNITY)
Admission: EM | Disposition: A | Discharge status: Home / Self Care | Payer: Self-pay | Source: Home / Self Care | Attending: Internal Medicine

## 2021-08-16 ENCOUNTER — Encounter (HOSPITAL_COMMUNITY): Payer: Self-pay | Admitting: Internal Medicine

## 2021-08-16 ENCOUNTER — Other Ambulatory Visit: Payer: Self-pay

## 2021-08-16 DIAGNOSIS — F1190 Opioid use, unspecified, uncomplicated: Secondary | ICD-10-CM | POA: Diagnosis not present

## 2021-08-16 DIAGNOSIS — J869 Pyothorax without fistula: Secondary | ICD-10-CM

## 2021-08-16 DIAGNOSIS — Z720 Tobacco use: Secondary | ICD-10-CM

## 2021-08-16 DIAGNOSIS — R7401 Elevation of levels of liver transaminase levels: Secondary | ICD-10-CM

## 2021-08-16 DIAGNOSIS — J188 Other pneumonia, unspecified organism: Secondary | ICD-10-CM | POA: Diagnosis not present

## 2021-08-16 DIAGNOSIS — R9431 Abnormal electrocardiogram [ECG] [EKG]: Secondary | ICD-10-CM

## 2021-08-16 DIAGNOSIS — K5090 Crohn's disease, unspecified, without complications: Secondary | ICD-10-CM

## 2021-08-16 HISTORY — PX: CHEST TUBE INSERTION: SHX231

## 2021-08-16 LAB — ECHOCARDIOGRAM COMPLETE
Area-P 1/2: 2.43 cm2
S' Lateral: 3.2 cm

## 2021-08-16 LAB — HEPATIC FUNCTION PANEL
ALT: 82 U/L — ABNORMAL HIGH (ref 0–44)
AST: 74 U/L — ABNORMAL HIGH (ref 15–41)
Albumin: 2.3 g/dL — ABNORMAL LOW (ref 3.5–5.0)
Alkaline Phosphatase: 108 U/L (ref 38–126)
Bilirubin, Direct: <0.1 mg/dL (ref 0.0–0.2)
Total Bilirubin: 0.3 mg/dL (ref 0.3–1.2)
Total Protein: 6 g/dL — ABNORMAL LOW (ref 6.5–8.1)

## 2021-08-16 LAB — BASIC METABOLIC PANEL
Anion gap: 9 (ref 5–15)
BUN: 7 mg/dL (ref 6–20)
CO2: 23 mmol/L (ref 22–32)
Calcium: 8.2 mg/dL — ABNORMAL LOW (ref 8.9–10.3)
Chloride: 109 mmol/L (ref 98–111)
Creatinine, Ser: 0.64 mg/dL (ref 0.61–1.24)
GFR, Estimated: >60 mL/min (ref >60)
Glucose, Bld: 80 mg/dL (ref 70–99)
Potassium: 3.2 mmol/L — ABNORMAL LOW (ref 3.5–5.1)
Sodium: 141 mmol/L (ref 135–145)

## 2021-08-16 LAB — RAPID URINE DRUG SCREEN, HOSP PERFORMED
Amphetamines: NOT DETECTED
Barbiturates: NOT DETECTED
Benzodiazepines: NOT DETECTED
Cocaine: NOT DETECTED
Opiates: NOT DETECTED
Tetrahydrocannabinol: NOT DETECTED

## 2021-08-16 LAB — CBC
HCT: 26.4 % — ABNORMAL LOW (ref 39.0–52.0)
Hemoglobin: 8.8 g/dL — ABNORMAL LOW (ref 13.0–17.0)
MCH: 30.7 pg (ref 26.0–34.0)
MCHC: 33.3 g/dL (ref 30.0–36.0)
MCV: 92 fL (ref 80.0–100.0)
Platelets: 403 10*3/uL — ABNORMAL HIGH (ref 150–400)
RBC: 2.87 MIL/uL — ABNORMAL LOW (ref 4.22–5.81)
RDW: 16.7 % — ABNORMAL HIGH (ref 11.5–15.5)
WBC: 11.2 10*3/uL — ABNORMAL HIGH (ref 4.0–10.5)
nRBC: 0 % (ref 0.0–0.2)

## 2021-08-16 SURGERY — CHEST TUBE INSERTION
Anesthesia: LOCAL

## 2021-08-16 MED ORDER — ACETAMINOPHEN 325 MG PO TABS
650.0000 mg | ORAL_TABLET | Freq: Four times a day (QID) | ORAL | Status: DC | PRN
  Administered 2021-08-16 (×2): 650 mg via ORAL
  Filled 2021-08-16 (×2): qty 2

## 2021-08-16 MED ORDER — OXYCODONE HCL 5 MG PO TABS
5.0000 mg | ORAL_TABLET | Freq: Three times a day (TID) | ORAL | Status: DC | PRN
  Administered 2021-08-16 – 2021-08-17 (×4): 5 mg via ORAL
  Filled 2021-08-16 (×4): qty 1

## 2021-08-16 MED ORDER — SODIUM CHLORIDE 0.9% FLUSH
10.0000 mL | Freq: Three times a day (TID) | INTRAVENOUS | Status: DC
  Administered 2021-08-16 – 2021-08-22 (×15): 10 mL via INTRAPLEURAL

## 2021-08-16 MED ORDER — KETOROLAC TROMETHAMINE 15 MG/ML IJ SOLN
15.0000 mg | Freq: Four times a day (QID) | INTRAMUSCULAR | Status: DC
  Administered 2021-08-16 – 2021-08-17 (×3): 15 mg via INTRAVENOUS
  Filled 2021-08-16 (×3): qty 1

## 2021-08-16 MED ORDER — POTASSIUM CHLORIDE CRYS ER 20 MEQ PO TBCR
40.0000 meq | EXTENDED_RELEASE_TABLET | Freq: Once | ORAL | Status: AC
  Administered 2021-08-16: 40 meq via ORAL
  Filled 2021-08-16: qty 2

## 2021-08-16 MED ORDER — FENTANYL CITRATE (PF) 100 MCG/2ML IJ SOLN
INTRAMUSCULAR | Status: DC | PRN
  Administered 2021-08-16: 25 ug via INTRAVENOUS

## 2021-08-16 MED ORDER — TRAMADOL HCL 50 MG PO TABS
50.0000 mg | ORAL_TABLET | Freq: Four times a day (QID) | ORAL | Status: DC | PRN
  Administered 2021-08-17 – 2021-08-22 (×17): 50 mg via ORAL
  Filled 2021-08-16 (×17): qty 1

## 2021-08-16 MED ORDER — FENTANYL CITRATE (PF) 100 MCG/2ML IJ SOLN: 25.0000 ug | Freq: Once | INTRAMUSCULAR | Status: DC | PRN

## 2021-08-16 MED ORDER — KETOROLAC TROMETHAMINE 15 MG/ML IJ SOLN: 15.0000 mg | Freq: Four times a day (QID) | INTRAMUSCULAR | Status: DC

## 2021-08-16 MED ORDER — SODIUM CHLORIDE (PF) 0.9 % IJ SOLN
10.0000 mg | Freq: Once | INTRAMUSCULAR | Status: AC
  Administered 2021-08-16: 10 mg via INTRAPLEURAL
  Filled 2021-08-16: qty 10

## 2021-08-16 MED ORDER — LIDOCAINE 5 % EX PTCH
1.0000 | MEDICATED_PATCH | Freq: Every day | CUTANEOUS | Status: DC
Start: 2021-08-16 — End: 2021-08-22
  Administered 2021-08-16 – 2021-08-21 (×7): 1 via TRANSDERMAL
  Filled 2021-08-16 (×7): qty 1

## 2021-08-16 MED ORDER — SODIUM CHLORIDE 0.9 % IV SOLN
2.0000 g | Freq: Three times a day (TID) | INTRAVENOUS | Status: DC
  Administered 2021-08-16 – 2021-08-22 (×19): 2 g via INTRAVENOUS
  Filled 2021-08-16 (×19): qty 12.5

## 2021-08-16 MED ORDER — ONDANSETRON HCL 4 MG PO TABS
4.0000 mg | ORAL_TABLET | Freq: Three times a day (TID) | ORAL | Status: DC | PRN
  Administered 2021-08-19 – 2021-08-22 (×2): 4 mg via ORAL
  Filled 2021-08-16 (×2): qty 1

## 2021-08-16 MED ORDER — ACETAMINOPHEN 500 MG PO TABS
1000.0000 mg | ORAL_TABLET | Freq: Four times a day (QID) | ORAL | Status: DC | PRN
  Administered 2021-08-16 – 2021-08-21 (×13): 1000 mg via ORAL
  Filled 2021-08-16 (×13): qty 2

## 2021-08-16 MED ORDER — OXYCODONE HCL 5 MG PO TABS
5.0000 mg | ORAL_TABLET | Freq: Three times a day (TID) | ORAL | Status: DC | PRN
Start: 2021-08-16 — End: 2021-08-16

## 2021-08-16 MED ORDER — SIMETHICONE 80 MG PO CHEW
80.0000 mg | CHEWABLE_TABLET | Freq: Four times a day (QID) | ORAL | Status: DC | PRN
Start: 2021-08-16 — End: 2021-08-22
  Administered 2021-08-16 – 2021-08-21 (×5): 80 mg via ORAL
  Filled 2021-08-16 (×7): qty 1

## 2021-08-16 MED ORDER — STERILE WATER FOR INJECTION IJ SOLN
5.0000 mg | Freq: Once | RESPIRATORY_TRACT | Status: AC
  Administered 2021-08-16: 5 mg via INTRAPLEURAL
  Filled 2021-08-16: qty 5

## 2021-08-16 MED ORDER — FENTANYL CITRATE (PF) 100 MCG/2ML IJ SOLN
INTRAMUSCULAR | Status: AC
  Filled 2021-08-16: qty 2

## 2021-08-16 MED ORDER — IBUPROFEN 400 MG PO TABS
800.0000 mg | ORAL_TABLET | Freq: Four times a day (QID) | ORAL | Status: DC | PRN
  Administered 2021-08-16: 800 mg via ORAL
  Filled 2021-08-16: qty 2

## 2021-08-16 MED ORDER — ONDANSETRON HCL 4 MG PO TABS
4.0000 mg | ORAL_TABLET | Freq: Once | ORAL | Status: AC
  Administered 2021-08-16: 4 mg via ORAL
  Filled 2021-08-16: qty 1

## 2021-08-16 MED ORDER — GUAIFENESIN-DM 100-10 MG/5ML PO SYRP
5.0000 mL | ORAL_SOLUTION | ORAL | Status: DC | PRN
  Administered 2021-08-16 – 2021-08-21 (×14): 5 mL via ORAL
  Filled 2021-08-16 (×14): qty 5

## 2021-08-16 MED ORDER — METHOCARBAMOL 500 MG PO TABS
1000.0000 mg | ORAL_TABLET | Freq: Three times a day (TID) | ORAL | Status: DC
  Administered 2021-08-16 – 2021-08-18 (×6): 1000 mg via ORAL
  Filled 2021-08-16 (×6): qty 2

## 2021-08-16 NOTE — Unmapped (Signed)
I contacted patient regarding the next shipment of Humira and he declined the refill. Mr. Brigner is currently in the hospital and he stated the provider wants him to hold off on taking that medication until his next appointment on 09/21/21. The patient has requested a call back around that time, I will reschedule refill call for next month.

## 2021-08-16 NOTE — Progress Notes (Signed)
  Echocardiogram 2D Echocardiogram has been performed.  Seth Foster 08/16/2021, 11:01 AM

## 2021-08-16 NOTE — Progress Notes (Signed)
  Transition of Care [facility]) Screening Note   Patient Details  Name: Seth Foster Date of Birth: 15-Mar-1980   Transition of Care [facility]) CM/SW Contact:    Cyndi Bender, RN Phone Number: 08/16/2021, 8:55 AM    Transition of Care Department [facility]) has reviewed patient and no TOC needs have been identified at this time. We will continue to monitor patient advancement through interdisciplinary progression rounds. If new patient transition needs arise, please place a TOC consult.

## 2021-08-16 NOTE — Op Note (Signed)
Insertion of Chest Tube Procedure Note  Seth Foster  [id number]  [birth year]  Date:08/16/21  Time:3:25 PM    Provider Performing: Roselie Awkward   Procedure: Chest Tube Insertion (15520)  Indication(s) Effusion  Consent Risks of the procedure as well as the alternatives and risks of each were explained to the patient and/or caregiver.  Consent for the procedure was obtained and is signed in the bedside chart  Anesthesia Topical only with 1% lidocaine    Time Out Verified patient identification, verified procedure, site/side was marked, verified correct patient position, special equipment/implants available, medications/allergies/relevant history reviewed, required imaging and test results available.   Sterile Technique Maximal sterile technique including full sterile barrier drape, hand hygiene, sterile gown, sterile gloves, mask, hair covering, sterile ultrasound probe cover (if used).   Procedure Description Ultrasound used to identify appropriate pleural anatomy for placement and overlying skin marked. Area of placement cleaned and draped in sterile fashion.  A 14 French pigtail pleural catheter was placed into the right pleural space using Seldinger technique. Appropriate return of sanguinous fluid and gas was obtained.  The tube was connected to atrium and placed on -20 cm H2O wall suction.   Complications/Tolerance None; patient tolerated the procedure well. Chest X-ray is ordered to verify placement.   EBL Minimal  Specimen(s) none  Roselie Awkward, MD [place] [facility] Pager: [phone] Cell: [phone] After 7:00 pm call Elink  [phone]

## 2021-08-16 NOTE — ED Notes (Signed)
ED TO INPATIENT HANDOFF REPORT  ED Nurse Name and Phone #: [place] Coach Name/Age/Gender Seth Foster 41 y.o. male Room/Bed: 007C/007C  Code Status   Code Status: Full Code  Home/SNF/Other Home Patient oriented to: self, place, time, and situation Is this baseline? Yes   Triage Complete: Triage complete  Chief Complaint Empyema of right pleural space [facility]) [J86.9]  Triage Note Patient here with complaint of shortness of breath that started three months ago, reports having a pleural effusion and a drainage tube one month ago. Patient is alert, oriented, speaking in complete sentences, and is in no apparent distress at this time.   Allergies Allergies  Allergen Reactions   Benadryl [Diphenhydramine Hcl] Anaphylaxis   Broccoli [Brassica Oleracea] Other (See Comments)    Causes terrible gas.    Scallops [Shellfish Allergy] Diarrhea and Nausea And Vomiting   Sulfa Antibiotics Hives   Tylenol [Acetaminophen] Nausea And Vomiting    Level of Care/Admitting Diagnosis ED Disposition     ED Disposition  Admit   Condition  --   [place]: [place] [[id number]]  Level of Care: Progressive [102]  Admit to Progressive based on following criteria: RESPIRATORY PROBLEMS hypoxemic/hypercapnic respiratory failure that is responsive to NIPPV (BiPAP) or High Flow Nasal Cannula (6-80 lpm). Frequent assessment/intervention, no > Q2 hrs < Q4 hrs, to maintain oxygenation and pulmonary hygiene.  May admit patient to [facility] or [facility] if equivalent level of care is available:: No  Covid Evaluation: Asymptomatic - no recent exposure (last 10 days) testing not required  Diagnosis: Empyema of right pleural space [facility]) [[id number]]  Admitting Physician: Sid Falcon [[id number]]  Attending Physician: Sid Falcon [[id number]]  Certification:: I certify this patient will need inpatient services for at least 2 midnights  Estimated Length of Stay: 6           B Medical/Surgery History Past Medical History:  Diagnosis Date   Asthma    Chronic pain syndrome    Crohn disease ([place])    Past Surgical History:  Procedure Laterality Date   ABDOMINAL SURGERY     surgery for Crohns       A IV Location/Drains/Wounds Patient Lines/Drains/Airways Status     Active Line/Drains/Airways     Name Placement date Placement time Site Days   Peripheral IV 08/15/21 20 G Right Antecubital 08/15/21  1538  Antecubital  1            Intake/Output Last 24 hours  Intake/Output Summary (Last 24 hours) at 08/16/2021 0209 Last data filed at 08/15/2021 2318 Gross per 24 hour  Intake 1351.38 ml  Output --  Net 1351.38 ml    Labs/Imaging Results for orders placed or performed during the hospital encounter of 08/15/21 (from the past 48 hour(s))  Comprehensive metabolic panel     Status: Abnormal   Collection Time: 08/15/21  8:59 AM  Result Value Ref Range   Sodium 138 135 - 145 mmol/L   Potassium 3.6 3.5 - 5.1 mmol/L   Chloride 102 98 - 111 mmol/L   CO2 25 22 - 32 mmol/L   Glucose, Bld 88 70 - 99 mg/dL    Comment: Glucose reference range applies only to samples taken after fasting for at least 8 hours.   BUN 6 6 - 20 mg/dL   Creatinine, Ser 0.65 0.61 - 1.24 mg/dL   Calcium 8.6 (L) 8.9 - 10.3 mg/dL   Total Protein 6.7 6.5 - 8.1 g/dL  Albumin 2.5 (L) 3.5 - 5.0 g/dL   AST 52 (H) 15 - 41 U/L   ALT 59 (H) 0 - 44 U/L   Alkaline Phosphatase 116 38 - 126 U/L   Total Bilirubin 0.3 0.3 - 1.2 mg/dL   GFR, Estimated >60 >60 mL/min    Comment: (NOTE) Calculated using the CKD-EPI Creatinine Equation (2021)    Anion gap 11 5 - 15    Comment: Performed at [facility] [address], [place], [place] [address]  CBC with Differential     Status: Abnormal   Collection Time: 08/15/21  8:59 AM  Result Value Ref Range   WBC 16.4 (H) 4.0 - 10.5 K/uL   RBC 3.22 (L) 4.22 - 5.81 MIL/uL   Hemoglobin 10.0 (L) 13.0 - 17.0 g/dL   HCT 30.5 (L) 39.0 - 52.0  %   MCV 94.7 80.0 - 100.0 fL   MCH 31.1 26.0 - 34.0 pg   MCHC 32.8 30.0 - 36.0 g/dL   RDW 16.9 (H) 11.5 - 15.5 %   Platelets 479 (H) 150 - 400 K/uL   nRBC 0.0 0.0 - 0.2 %   Neutrophils Relative % 87 %   Neutro Abs 14.2 (H) 1.7 - 7.7 K/uL   Lymphocytes Relative 5 %   Lymphs Abs 0.9 0.7 - 4.0 K/uL   Monocytes Relative 5 %   Monocytes Absolute 0.8 0.1 - 1.0 K/uL   Eosinophils Relative 2 %   Eosinophils Absolute 0.4 0.0 - 0.5 K/uL   Basophils Relative 0 %   Basophils Absolute 0.1 0.0 - 0.1 K/uL   Immature Granulocytes 1 %   Abs Immature Granulocytes 0.08 (H) 0.00 - 0.07 K/uL    Comment: Performed at [facility] [address] [place], [place] [address]  Lactic acid, plasma     Status: Abnormal   Collection Time: 08/15/21  5:39 PM  Result Value Ref Range   Lactic Acid, Venous 2.0 (HH) 0.5 - 1.9 mmol/L    Comment: CRITICAL RESULT CALLED TO, READ BACK BY AND VERIFIED WITH C ORTEGA RN [id number] 08/15/2021 BY R VERAAR Performed at [facility], [place] [address], [place], [place] [address]   Lactic acid, plasma     Status: None   Collection Time: 08/15/21  8:34 PM  Result Value Ref Range   Lactic Acid, Venous 1.9 0.5 - 1.9 mmol/L    Comment: Performed at [facility] [address], [place], [place] [address]  HIV Antibody (routine testing w rflx)     Status: None   Collection Time: 08/15/21  8:34 PM  Result Value Ref Range   HIV Screen 4th Generation wRfx Non Reactive Non Reactive    Comment: Performed at [facility], [place] [address], [place], [place] [address]  CBC     Status: Abnormal   Collection Time: 08/16/21  1:11 AM  Result Value Ref Range   WBC 11.2 (H) 4.0 - 10.5 K/uL   RBC 2.87 (L) 4.22 - 5.81 MIL/uL   Hemoglobin 8.8 (L) 13.0 - 17.0 g/dL   HCT 26.4 (L) 39.0 - 52.0 %   MCV 92.0 80.0 - 100.0 fL   MCH 30.7 26.0 - 34.0 pg   MCHC 33.3 30.0 - 36.0 g/dL   RDW 16.7 (H) 11.5 - 15.5 %   Platelets 403 (H) 150 - 400 K/uL   nRBC 0.0 0.0 - 0.2 %    Comment:  Performed at [facility]  Lab, [address], [place], [place] [address]  Basic metabolic panel     Status: Abnormal   Collection Time: 08/16/21  1:11 AM  Result Value Ref Range   Sodium 141 135 - 145 mmol/L   Potassium 3.2 (L) 3.5 - 5.1 mmol/L   Chloride 109 98 - 111 mmol/L   CO2 23 22 - 32 mmol/L   Glucose, Bld 80 70 - 99 mg/dL    Comment: Glucose reference range applies only to samples taken after fasting for at least 8 hours.   BUN 7 6 - 20 mg/dL   Creatinine, Ser 0.64 0.61 - 1.24 mg/dL   Calcium 8.2 (L) 8.9 - 10.3 mg/dL   GFR, Estimated >60 >60 mL/min    Comment: (NOTE) Calculated using the CKD-EPI Creatinine Equation (2021)    Anion gap 9 5 - 15    Comment: Performed at [facility] [address] [place], [place] [address]   CT Chest W Contrast  Result Date: 08/15/2021 CLINICAL DATA:  Worsening shortness of breath. Respiratory illness. Recent chest tube drainage of right pleural effusion. EXAM: CT CHEST WITH CONTRAST TECHNIQUE: Multidetector CT imaging of the chest was performed during intravenous contrast administration. RADIATION DOSE REDUCTION: This exam was performed according to the departmental dose-optimization program which includes automated exposure control, adjustment of the mA and/or kV according to patient size and/or use of iterative reconstruction technique. CONTRAST:  50m OMNIPAQUE IOHEXOL 300 MG/ML  SOLN COMPARISON:  None Available. FINDINGS: Cardiovascular: No acute findings. Coronary artery atherosclerosis noted. Mediastinum/Nodes: Mild subcarinal lymphadenopathy is seen measuring 1.8 cm in short axis on image 66/3. Other sub-cm lymph nodes are seen throughout the mediastinum and both hilar regions. Mildly enlarged bilateral supraclavicular lymph nodes are seen measuring up to 10 mm short axis. Lungs/Pleura: A moderate size multiloculated right pleural effusion is seen which contains internal gas and also shows peripheral contrast enhancement. This is  suspicious for an empyema. Patchy airspace opacity is seen throughout the left lung, greatest in the left lower lobe, most likely representing an infectious or inflammatory etiology. No evidence of left-sided pleural effusion. Upper Abdomen:  Unremarkable. Musculoskeletal:  No suspicious bone lesions. IMPRESSION: Moderate size multiloculated right pleural effusion with internal gas and peripheral contrast enhancement, suspicious for empyema. Patchy airspace opacity throughout the left lung, greatest in the left lower lobe, most likely representing an infectious or inflammatory etiology. Mild mediastinal and bilateral supraclavicular lymphadenopathy, which is nonspecific and may be reactive in etiology. Recommend continued attention on follow-up imaging. Electronically Signed   By: JMarlaine HindM.D.   On: 08/15/2021 16:34   DG Chest 2 View  Result Date: 08/15/2021 CLINICAL DATA:  Shortness of breath. Reportedly, the patient has had a pleural effusion and recent drainage tube. EXAM: CHEST - 2 VIEW COMPARISON:  Chest radiograph 09/30/2020 FINDINGS: Significant change compared to the prior chest radiograph. Opacities throughout the mid and lower right lung are suggestive for pleural fluid along with parenchymal lung densities. Right paratracheal densities could represent lung densities versus enlarged mediastinal tissue. In addition, there are patchy interstitial and airspace densities in the central aspect of the left lung. Heart size is within normal limits. IMPRESSION: 1. Patchy densities throughout the right hemithorax likely representing a combination of pleural and parenchymal densities. Cannot exclude loculated or complex right pleural effusions. 2. Patchy airspace densities in both lungs and findings could represent multifocal pneumonia. These findings could be better characterized with a chest CT with IV contrast. Electronically Signed  By: Markus Daft M.D.   On: 08/15/2021 09:31    Pending  Labs Unresulted Labs (From admission, onward)     Start     Ordered   08/15/21 1807  MRSA Next Gen by PCR, Nasal  (MRSA Screening)  Once,   URGENT        08/15/21 1806   08/15/21 1637  Blood culture (routine x 2)  BLOOD CULTURE X 2,   R (with STAT occurrences)      08/15/21 1638            Vitals/Pain Today's Vitals   08/15/21 2030 08/15/21 2033 08/15/21 2119 08/16/21 0100  BP: 109/89  123/78 117/83  Pulse: 71  63 75  Resp:   (!) 24 (!) 26  Temp:  98.5 F (36.9 C)  98 F (36.7 C)  TempSrc:    Oral  SpO2: 97%  98% 93%  PainSc:    7     Isolation Precautions No active isolations  Medications Medications  enoxaparin (LOVENOX) injection 40 mg (40 mg Subcutaneous Given 08/15/21 2018)  traMADol (ULTRAM) tablet 50 mg (50 mg Oral Given 08/15/21 2042)  methocarbamol (ROBAXIN) tablet 500 mg (500 mg Oral Given 08/16/21 0105)  albuterol (PROVENTIL) (2.5 MG/3ML) 0.083% nebulizer solution 3 mL (has no administration in time range)  divalproex (DEPAKOTE ER) 24 hr tablet 1,000 mg (has no administration in time range)  lamoTRIgine (LAMICTAL) tablet 200 mg (has no administration in time range)  hydrOXYzine (ATARAX) tablet 10 mg (10 mg Oral Given 08/16/21 0106)  nicotine (NICODERM CQ - dosed in mg/24 hours) patch 21 mg (21 mg Transdermal Patch Applied 08/16/21 0104)  vancomycin (VANCOREADY) IVPB 500 mg/100 mL (has no administration in time range)  acetaminophen (TYLENOL) tablet 650 mg (has no administration in time range)  lidocaine (LIDODERM) 5 % 1 patch (has no administration in time range)  oxyCODONE (Oxy IR/ROXICODONE) immediate release tablet 5 mg (has no administration in time range)  iohexol (OMNIPAQUE) 300 MG/ML solution 75 mL (75 mLs Intravenous Contrast Given 08/15/21 1615)  ceFEPIme (MAXIPIME) 2 g in sodium chloride 0.9 % 100 mL IVPB (0 g Intravenous Stopped 08/15/21 2019)  sodium chloride 0.9 % bolus 1,000 mL (0 mLs Intravenous Stopped 08/15/21 2019)  vancomycin (VANCOREADY) IVPB 1250  mg/250 mL (0 mg Intravenous Stopped 08/15/21 2141)  vancomycin (VANCOREADY) IVPB 1250 mg/250 mL (0 mg Intravenous Stopped 08/15/21 2318)    Mobility walks Low fall risk   Focused Assessments Pulmonary Assessment Handoff:  Lung sounds:  Diminished on right O2 Device: Room Air      R Recommendations: See Admitting Provider Note  Report given to:   Additional Notes: na

## 2021-08-16 NOTE — Consult Note (Addendum)
[phone]       [place] [address]             [phone]        Thaddeus E Tullius [place] Medical Record #[id number] Date of Birth: 1980-04-25  Referring: Dr. Andre Lefort, MD Primary Care: Gustavus Bryant, PA-C  Chief Complaint:    Chief Complaint  Patient presents with   Shortness of Breath  Reason for consultation: Recurrent, loculated right pleural effusion/empyema  History of Present Illness:     This is a 41 year old male with a past medical history of Crohn's disease (multiple surgeries at [facility], colostomy reversal at [facility], on Adalimumap and 6 Mercaptopurine), latent TB, tobacco abuse,bipolar, substance abuse (opioids from multiple surgeries) who presented to the [facility] ED with complaints of worsening shortness of breath. Patient was recently hospitalized in [place] for Strep Intermedius RLL PNA/empyema at the end of July 2023. He underwent chest tube placement and a right VATS and decortication,. He was discharged on 08/04/2021 on Augmentin. He states has had pain on that right side since being discharged. He saw his PCP last week and it was recommended he alternate Tylenol and Ibuprofen. About 4 days ago, he had difficulty breathing and worsening pain with taking a deep breath. He also felt "febrile" and had a productive cough (green in color). In the ED, WBC count was 16,400. Chest x ray showed bilateral opacities and patchy airspace disease, possibly pneumonia and loculated/complex right pleural effusion.  CT of the chest showed moderate size multiloculated right pleural effusion with internal gas, suspicious for empyema. He was put on Cefepime and Vancomycin. Echo was done today and showed abnormalities (no significant valvular disease or evidence of endocarditis). Pulmonary was consulted and then a cardiothoracic consultation was requested. At the time of my exam, patient had just finished eating lunch. He was in no acute distress. He has  questions regarding his pain control and is asking if Ultram can be scheduled more frequently.    Current Activity/ Functional Status: Patient is independent with mobility/ambulation, transfers, ADL's, IADL's.   Zubrod Score: At the time of surgery this patient's most appropriate activity status/level should be described as: '[]'$     0    Normal activity, no symptoms '[x]'$     1    Restricted in physical strenuous activity but ambulatory, able to do out light work '[]'$     2    Ambulatory and capable of self care, unable to do work activities, up and about more than 50%  of the time                            '[]'$     3    Only limited self care, in bed greater than 50% of waking hours '[]'$     4    Completely disabled, no self care, confined to bed or chair '[]'$     5    Moribund  Past Medical History:  Diagnosis Date   Asthma    Chronic pain syndrome    Crohn disease ([place])   Anxiety  Past Surgical History:  Procedure Laterality Date   ABDOMINAL SURGERY     surgery for Crohns      Social History   Tobacco Use  Smoking Status Every Day   Packs/day: 0.50   Years: 16.00   Total pack years: 8.00   Types: Cigarettes  Smokeless Tobacco  Never    Social History   Substance and Sexual Activity  Alcohol Use Yes   Alcohol/week: 0.0 standard drinks of alcohol     Allergies  Allergen Reactions   Benadryl [Diphenhydramine Hcl] Anaphylaxis   Broccoli [Brassica Oleracea] Other (See Comments)    Causes terrible gas.    Scallops [Shellfish Allergy] Diarrhea and Nausea And Vomiting   Sulfa Antibiotics Hives   Tylenol [Acetaminophen] Nausea And Vomiting    Current Facility-Administered Medications  Medication Dose Route Frequency Provider Last Rate Last Admin   acetaminophen (TYLENOL) tablet 650 mg  650 mg Oral Q6H PRN Linus Galas, MD   650 mg at 08/16/21 0220   albuterol (PROVENTIL) (2.5 MG/3ML) 0.083% nebulizer solution 3 mL  3 mL Inhalation Q6H PRN Katsadouros, Vasilios, MD        ceFEPIme (MAXIPIME) 2 g in sodium chloride 0.9 % 100 mL IVPB  2 g Intravenous Q8H Pierce, Dwayne A, RPH       divalproex (DEPAKOTE ER) 24 hr tablet 1,000 mg  1,000 mg Oral Q supper Katsadouros, Vasilios, MD       enoxaparin (LOVENOX) injection 40 mg  40 mg Subcutaneous Q24H Katsadouros, Vasilios, MD   40 mg at 08/15/21 2018   guaiFENesin-dextromethorphan (ROBITUSSIN DM) 100-10 MG/5ML syrup 5 mL  5 mL Oral Q4H PRN Linus Galas, MD   5 mL at 08/16/21 0845   hydrOXYzine (ATARAX) tablet 10 mg  10 mg Oral TID PRN Riesa Pope, MD   10 mg at 08/16/21 0106   lamoTRIgine (LAMICTAL) tablet 200 mg  200 mg Oral Daily Katsadouros, Vasilios, MD       lidocaine (LIDODERM) 5 % 1 patch  1 patch Transdermal QHS Linus Galas, MD   1 patch at 08/16/21 0223   methocarbamol (ROBAXIN) tablet 500 mg  500 mg Oral Q8H PRN Katsadouros, Vasilios, MD   500 mg at 08/16/21 0105   nicotine (NICODERM CQ - dosed in mg/24 hours) patch 21 mg  21 mg Transdermal Daily Katsadouros, Vasilios, MD   21 mg at 08/16/21 3220   oxyCODONE (Oxy IR/ROXICODONE) immediate release tablet 5 mg  5 mg Oral Q8H PRN Linus Galas, MD   5 mg at 08/16/21 0404   simethicone (MYLICON) chewable tablet 80 mg  80 mg Oral QID PRN Iona Beard, MD       traMADol Veatrice Bourbon) tablet 50 mg  50 mg Oral Q8H PRN Katsadouros, Vasilios, MD   50 mg at 08/16/21 0829   vancomycin (VANCOREADY) IVPB 500 mg/100 mL  500 mg Intravenous Q12H Gilles Chiquito B, MD 100 mL/hr at 08/16/21 0841 500 mg at 08/16/21 0841    Medications Prior to Admission  Medication Sig Dispense Refill Last Dose   acetaminophen (TYLENOL) 500 MG tablet Take 1,000 mg by mouth every 4 (four) hours as needed for moderate pain.   08/14/2021   albuterol (PROVENTIL HFA;VENTOLIN HFA) 108 (90 BASE) MCG/ACT inhaler Inhale 2 puffs into the lungs every 6 (six) hours as needed for wheezing or shortness of breath. 1 Inhaler 1 08/15/2021   amoxicillin-clavulanate (AUGMENTIN) 875-125 MG  tablet Take 1 tablet by mouth 2 (two) times daily.   08/14/2021   divalproex (DEPAKOTE ER) 500 MG 24 hr tablet Take 1,000 mg by mouth every evening.   08/14/2021   ibuprofen (ADVIL) 200 MG tablet Take 600-800 mg by mouth every 4 (four) hours as needed for moderate pain.   08/14/2021   lamoTRIgine (LAMICTAL) 200 MG tablet Take 200 mg by mouth every evening.  08/14/2021   mercaptopurine (PURINETHOL) 50 MG tablet Take 75 mg by mouth daily.   Past Month   methocarbamol (ROBAXIN) 500 MG tablet Take 500 mg by mouth every 6 (six) hours as needed for muscle spasms.   08/14/2021   promethazine (PHENERGAN) 12.5 MG tablet Take 12.5 mg by mouth every 6 (six) hours as needed for nausea or vomiting.   Past Week   traMADol (ULTRAM) 50 MG tablet Take 50 mg by mouth every 6 (six) hours as needed for moderate pain.   08/15/2021   doxycycline (VIBRAMYCIN) 100 MG capsule Take 1 capsule (100 mg total) by mouth 2 (two) times daily. (Patient not taking: Reported on 10/22/2019) 20 capsule 0 Not Taking   HUMIRA PEN 40 MG/0.4ML PNKT Inject 40 mg into the skin every 14 (fourteen) days.   over 1 month   HYDROCORTISONE ACE, RECTAL, 30 MG SUPP Place 1 suppository (30 mg total) rectally 2 (two) times daily. As directed (Patient not taking: Reported on 10/22/2019) 28 each 1 Not Taking   hydrocortisone-pramoxine (PROCTOFOAM-HC) rectal foam Place 1 applicator rectally 2 (two) times daily. (Patient not taking: Reported on 10/22/2019) 10 g 1 Not Taking   hydrOXYzine (ATARAX/VISTARIL) 25 MG tablet Take 1 tablet (25 mg total) by mouth 3 (three) times daily as needed for anxiety. (Patient not taking: Reported on 10/22/2019) 60 tablet 0 Not Taking   oxyCODONE (OXY IR/ROXICODONE) 5 MG immediate release tablet Take 1 tablet (5 mg total) by mouth every 6 (six) hours as needed for severe pain. (Patient not taking: Reported on 10/22/2019) 56 tablet 0 Not Taking   PARoxetine (PAXIL) 20 MG tablet Take 1 tablet (20 mg total) by mouth daily. (Patient not  taking: Reported on 10/22/2019) 30 tablet 5 Not Taking   promethazine (PHENERGAN) 25 MG tablet Take 1 tablet (25 mg total) by mouth 3 (three) times daily. (Patient not taking: Reported on 10/22/2019) 45 tablet 0 Not Taking   temazepam (RESTORIL) 15 MG capsule Take 1 capsule (15 mg total) by mouth daily. (Patient not taking: Reported on 10/22/2019) 30 capsule 0 Not Taking   tiZANidine (ZANAFLEX) 4 MG tablet TAKE 1 TABLET (4 MG TOTAL) BY MOUTH EVERY 6 (SIX) HOURS AS NEEDED FOR MUSCLE SPASMS. (Patient not taking: No sig reported) 40 tablet 5 Not Taking   Family History: Unknown    Review of Systems:      General Review of Systems: [Y] = yes [  N]=no Constitional: nausea [ N ];  fever [ Y ];  Dental: Last Dentist visit: Recently had 8 teeth removed  Eye : Amaurosis fugax[ N ]; Resp: cough [  Y];  wheezing[ N ];  hemoptysis[ N ]; shortness of breath[ Y-exertional ];  GI: vomiting[  N];  melena[N  ];  hematochezia [ N ];  GU:  hematuria[ N ];              Musculoskeletal: muscle spasms [ Y ];    Heme/Lymph:  anemia[Y  ];  Neuro: TIA[  N];   stroke[N  ];    Psych:anxiety[ Y ];  Endocrine: diabetes[ N ];                  Physical Exam: BP 100/63 (BP Location: Right Arm)   Pulse (!) 56   Temp 97.6 F (36.4 C) (Oral)   Resp 20   SpO2 96%    General appearance: alert, cooperative, and no distress Head: Normocephalic, without obvious abnormality, atraumatic Resp: Diminished basilar breath sounds on  the right Cardio: RRR, no murmur GI: Soft, non tender, multiple scars from previous surgeries Extremities: No LE edema. Palpable DP/PT bilaterally Neurologic: Grossly normal  Diagnostic Studies & Laboratory data:     Recent Radiology Findings:   CT Chest W Contrast  Result Date: 08/15/2021 CLINICAL DATA:  Worsening shortness of breath. Respiratory illness. Recent chest tube drainage of right pleural effusion. EXAM: CT CHEST WITH CONTRAST TECHNIQUE: Multidetector CT imaging of the chest  was performed during intravenous contrast administration. RADIATION DOSE REDUCTION: This exam was performed according to the departmental dose-optimization program which includes automated exposure control, adjustment of the mA and/or kV according to patient size and/or use of iterative reconstruction technique. CONTRAST:  7m OMNIPAQUE IOHEXOL 300 MG/ML  SOLN COMPARISON:  None Available. FINDINGS: Cardiovascular: No acute findings. Coronary artery atherosclerosis noted. Mediastinum/Nodes: Mild subcarinal lymphadenopathy is seen measuring 1.8 cm in short axis on image 66/3. Other sub-cm lymph nodes are seen throughout the mediastinum and both hilar regions. Mildly enlarged bilateral supraclavicular lymph nodes are seen measuring up to 10 mm short axis. Lungs/Pleura: A moderate size multiloculated right pleural effusion is seen which contains internal gas and also shows peripheral contrast enhancement. This is suspicious for an empyema. Patchy airspace opacity is seen throughout the left lung, greatest in the left lower lobe, most likely representing an infectious or inflammatory etiology. No evidence of left-sided pleural effusion. Upper Abdomen:  Unremarkable. Musculoskeletal:  No suspicious bone lesions. IMPRESSION: Moderate size multiloculated right pleural effusion with internal gas and peripheral contrast enhancement, suspicious for empyema. Patchy airspace opacity throughout the left lung, greatest in the left lower lobe, most likely representing an infectious or inflammatory etiology. Mild mediastinal and bilateral supraclavicular lymphadenopathy, which is nonspecific and may be reactive in etiology. Recommend continued attention on follow-up imaging. Electronically Signed   By: JMarlaine HindM.D.   On: 08/15/2021 16:34   DG Chest 2 View  Result Date: 08/15/2021 CLINICAL DATA:  Shortness of breath. Reportedly, the patient has had a pleural effusion and recent drainage tube. EXAM: CHEST - 2 VIEW COMPARISON:   Chest radiograph 09/30/2020 FINDINGS: Significant change compared to the prior chest radiograph. Opacities throughout the mid and lower right lung are suggestive for pleural fluid along with parenchymal lung densities. Right paratracheal densities could represent lung densities versus enlarged mediastinal tissue. In addition, there are patchy interstitial and airspace densities in the central aspect of the left lung. Heart size is within normal limits. IMPRESSION: 1. Patchy densities throughout the right hemithorax likely representing a combination of pleural and parenchymal densities. Cannot exclude loculated or complex right pleural effusions. 2. Patchy airspace densities in both lungs and findings could represent multifocal pneumonia. These findings could be better characterized with a chest CT with IV contrast. Electronically Signed   By: AMarkus DaftM.D.   On: 08/15/2021 09:31     I have independently reviewed the above radiologic studies and discussed with the patient   Recent Lab Findings: Lab Results  Component Value Date   WBC 11.2 (H) 08/16/2021   HGB 8.8 (L) 08/16/2021   HCT 26.4 (L) 08/16/2021   PLT 403 (H) 08/16/2021   GLUCOSE 80 08/16/2021   ALT 82 (H) 08/16/2021   AST 74 (H) 08/16/2021   NA 141 08/16/2021   K 3.2 (L) 08/16/2021   CL 109 08/16/2021   CREATININE 0.64 08/16/2021   BUN 7 08/16/2021   CO2 23 08/16/2021    Assessment / Plan:   Recurrent, loculated right pleural effusion-Dr. BCyndia Bent  recommends IR place right drainage tube. He may also need thrombolytics. 2. ID-on Cefepime and Vancomycin for pneumonia. Await blood cultures 3. Mildly elevated LFTs-AST 74, ALT 82. Monitor 4. History of bipolar disorder-on Depakote and Lamictal 5. History of substance abuse (opioids)-tried Suboxone previously but was not that useful. Per medicine 6. History of tobacco abuse-Nicotine patch 7. Anemia-H and H today decreased to 8.8 and 26.4. Per medicine  Regarding pain control, would  recommend Toradol IV (especially once chest tube placed) to help with thoracic pain. If ok with medicine, could consider changing frequency of Ultram as well.  I  spent 20 minutes counseling the patient face to face.   Lars Pinks PA-C 08/16/2021 9:52 AM   Chart reviewed, patient examined, agree with above. He is about a month out from treatment of a right empyema with chest tube initially and the VATS at [facility] in [place] where he was visiting. He was discharge on Augmentin 7/28. He says he felt better for a few days but then began having more right chest pain, SOB, fever and sweats, productive cough with greenish sputum. WBC 16.4 on presentation but afebrile. CT chest showed multiloculated right pleural fluid collection of moderate size with some air throughout and peripheral contrast enhancement suggesting persistent empyema. There was also patchy airspace opacity in left lung. This far out from his initial presentation and after VATS drainage I think open surgical drainage would be complicated with high risk of lung injury, prolonged air leak and persistent space. The lung is certainly very adherent to the chest wall by this time. I think the best option in this situation is pigtail catheter drainage and intrapleural fibrinolytics in conjunction with antibiotics. He had a pigtail catheter inserted by CCM today under US guidance which looks to be in good position with improved aeration of the right lung and decrease in the right effusion. He feels better. We will continue to follow but hopefully this will resolve the problem in this patient who has significant comorbidities.

## 2021-08-16 NOTE — Progress Notes (Signed)
HD#1 Subjective:   Summary: Seth Foster is a 41 year old male with a history of Crohn's disease s/p multiple surgical extractions previously on adalimumab and 6 mercaptopurine, bipolar disorder type II, substance use disorder, latent TB with 2 negative test, tobacco use disorder and recent treatment of empyema on the right lung with VATS who presented with 3 days of worsening pleuritic chest pain and productive cough and is admitted for right lung empyema.  Overnight Events: Patient continued to have pleuritic chest pain but denied any worsening of his shortness of breath or any other new or worsening symptoms.  Tylenol q6h, oxycodone 5 q8h, and lidocaine patch were added.  This morning he is sitting in bed and still complains of the same pleuritic chest pain but denies any other new or worsening symptoms.  He states the addition of the medications last night helped a little bit but that he was getting better control at home with alternating acetaminophen and ibuprofen at 1 g and 800 mg respectively.  Objective:  Vital signs in last 24 hours: Vitals:   08/16/21 0330 08/16/21 0335 08/16/21 0340 08/16/21 0800  BP:  118/76  100/63  Pulse: 62 80 (!) 59 (!) 56  Resp:  (!) 23 (!) 32 20  Temp:  98.6 F (37 C)  97.6 F (36.4 C)  TempSrc:  Oral  Oral  SpO2: 96% 98% 95% 96%   Supplemental O2: Room Air SpO2: 96 %   Physical Exam:  Constitutional: Middle-age male who appears older than stated age laying in bed, appears uncomfortable and tachypneic with shallow breaths. Neck: supple Cardiovascular: regular rate and rhythm, no m/r/g Pulmonary/Chest: Tachypneic and taking shallow breaths, decreased lung sounds throughout the right lung and diffuse coarse breath sounds better heard on the left Abdominal: soft and nondistended.  Tender to palpation in the upper abdomen mainly on the right with the pain being felt under the ribs and consistent with his existing pleuritic chest pain MSK: Thin,  no lower extremity edema or swollen joints Neurological: alert & oriented x 3 Skin: warm and dry, stable chest tube incisions on the right lateral rib cage   There were no vitals filed for this visit.   Intake/Output Summary (Last 24 hours) at 08/16/2021 1059 Last data filed at 08/16/2021 0951 Gross per 24 hour  Intake 1351.38 ml  Output 1 ml  Net 1350.38 ml   Net IO Since Admission: 1,350.38 mL [08/16/21 1059]  Pertinent Labs:    Latest Ref Rng & Units 08/16/2021    1:11 AM 08/15/2021    8:59 AM 10/22/2019    2:38 PM  CBC  WBC 4.0 - 10.5 K/uL 11.2  16.4  9.2   Hemoglobin 13.0 - 17.0 g/dL 8.8  10.0  12.2   Hematocrit 39.0 - 52.0 % 26.4  30.5  38.7   Platelets 150 - 400 K/uL 403  479  217        Latest Ref Rng & Units 08/16/2021    1:11 AM 08/15/2021    8:59 AM 10/22/2019    2:38 PM  CMP  Glucose 70 - 99 mg/dL 80  88  103   BUN 6 - 20 mg/dL '7  6  11   '$ Creatinine 0.61 - 1.24 mg/dL 0.64  0.65  0.83   Sodium 135 - 145 mmol/L 141  138  142   Potassium 3.5 - 5.1 mmol/L 3.2  3.6  4.2   Chloride 98 - 111 mmol/L 109  102  108  CO2 22 - 32 mmol/L '23  25  25   '$ Calcium 8.9 - 10.3 mg/dL 8.2  8.6  8.4   Total Protein 6.5 - 8.1 g/dL 6.0  6.7    Total Bilirubin 0.3 - 1.2 mg/dL 0.3  0.3    Alkaline Phos 38 - 126 U/L 108  116    AST 15 - 41 U/L 74  52    ALT 0 - 44 U/L 82  59      Imaging: CT Chest W Contrast  Result Date: 08/15/2021 CLINICAL DATA:  Worsening shortness of breath. Respiratory illness. Recent chest tube drainage of right pleural effusion. EXAM: CT CHEST WITH CONTRAST TECHNIQUE: Multidetector CT imaging of the chest was performed during intravenous contrast administration. RADIATION DOSE REDUCTION: This exam was performed according to the departmental dose-optimization program which includes automated exposure control, adjustment of the mA and/or kV according to patient size and/or use of iterative reconstruction technique. CONTRAST:  47m OMNIPAQUE IOHEXOL 300 MG/ML  SOLN  COMPARISON:  None Available. FINDINGS: Cardiovascular: No acute findings. Coronary artery atherosclerosis noted. Mediastinum/Nodes: Mild subcarinal lymphadenopathy is seen measuring 1.8 cm in short axis on image 66/3. Other sub-cm lymph nodes are seen throughout the mediastinum and both hilar regions. Mildly enlarged bilateral supraclavicular lymph nodes are seen measuring up to 10 mm short axis. Lungs/Pleura: A moderate size multiloculated right pleural effusion is seen which contains internal gas and also shows peripheral contrast enhancement. This is suspicious for an empyema. Patchy airspace opacity is seen throughout the left lung, greatest in the left lower lobe, most likely representing an infectious or inflammatory etiology. No evidence of left-sided pleural effusion. Upper Abdomen:  Unremarkable. Musculoskeletal:  No suspicious bone lesions. IMPRESSION: Moderate size multiloculated right pleural effusion with internal gas and peripheral contrast enhancement, suspicious for empyema. Patchy airspace opacity throughout the left lung, greatest in the left lower lobe, most likely representing an infectious or inflammatory etiology. Mild mediastinal and bilateral supraclavicular lymphadenopathy, which is nonspecific and may be reactive in etiology. Recommend continued attention on follow-up imaging. Electronically Signed   By: JMarlaine HindM.D.   On: 08/15/2021 16:34    Assessment/Plan:   Principal Problem:   Empyema of right pleural space ([facility]   Patient Summary: Seth Bingleyis a 41year old male with a history of Crohn's disease s/p multiple surgical extractions previously on adalimumab and 6 mercaptopurine, bipolar disorder type II, substance use disorder, latent TB with 2 negative test, tobacco use disorder and recent treatment of empyema on the right lung with VATS who presented with 3 days of worsening pleuritic chest pain and productive cough and is admitted for right lung empyema.   Right  sided empyema [place], [place] and antibiotics. He was discharged home with Augmentin with plan to complete on 09/04/2021. He has noticed worsening chest pain worsened with deep breath. Imaging consistent with recurrent empyema and multifocal pneumonia.  Remains hemodynamically stable.  PCCM recommended consulting CT surgery as he recently had VATS procedure.  CT surgery evaluated the patient and recommended that we try the pigtail drain with possible thrombolytics before proceeding with a repeat VATS surgery. -continue vancomycin and cefepime day 2 -PCCM and CT surgery consulted.  Proceed with pigtail drain with possible thrombolytics -trend wbc -blood cultures pending -robaxin, tramadol, Tylenol, and ibuprofen for chest wall pain.  Will hold Tylenol with rising transaminitis -[place]  Continue to trend and if RUQ pain or worsening labs, consider RUQ [place].  Will reevaluate after drain placement   EKG Changes Prior Ekg from 2021, new findings QRS findings in lead 1/3 and t wave inversions in 3. Possible secondary to severe pulmonary infection. Will continue to monitor for hemodynamic changes, echo pending   Crohn's disease Recently seen by his gastroenterologist. Will continue to hold adalimumab and 52m in setting of empyema.    Bipolar disorder Continue Depakote and Lamictal   Opioid use disorder Currently working with PCP and psychiatrist to find medical therapies. Trialed on suboxone and did not find useful, felt xanax helped more. Driving factor is increased anxiety.  -continue to follow up with PCP -Tramadol, Robaxin, ibuprofen, Tylenol for pain   Hx of positive quantiferon-TB Negative follow up x2, consistent with latent TB.    Tobacco use disorder Nicotine patch daily  Diet: Normal IVF: NS, with IV antibiotics VTE:  Enoxaparin Code: Full   Dispo: Anticipated discharge to Home pending evaluation and management of his PAF.  JFairmontInternal Medicine Resident PGY-1 Pager: [phone] Please contact the on call pager after 5 pm and on weekends at [phone]

## 2021-08-16 NOTE — Progress Notes (Signed)
Pharmacy Antibiotic Note  Seth Foster is a 41 y.o. male for which pharmacy has been consulted for cefepime dosing for sepsis.  Patient with a history of crohn's disease s/p resection, recent hospitalization for empyema requiring chest tube and VATS. Patient presenting with SOB.  Started on augmentin 7/28 (planned through 8/28)  SCr 0.64 WBC 16.4>>11.2;  Plan: Cefepime 2gm IV 8H Continue Vancomycin  500 mg q12hr (eAUC 497) unless change in renal function Trend WBC, Fever, Renal function, & Clinical course F/u cultures, clinical course, WBC, fever De-escalate when able Levels at steady state     Temp (24hrs), Avg:98 F (36.7 C), Min:97.6 F (36.4 C), Max:98.6 F (37 C)  Recent Labs  Lab 08/15/21 0859 08/15/21 1739 08/15/21 2034 08/16/21 0111  WBC 16.4*  --   --  11.2*  CREATININE 0.65  --   --  0.64  LATICACIDVEN  --  2.0* 1.9  --      CrCl cannot be calculated (Unknown ideal weight.).    Allergies  Allergen Reactions   Benadryl [Diphenhydramine Hcl] Anaphylaxis   Broccoli [Brassica Oleracea] Other (See Comments)    Causes terrible gas.    Scallops [Shellfish Allergy] Diarrhea and Nausea And Vomiting   Sulfa Antibiotics Hives   Tylenol [Acetaminophen] Nausea And Vomiting    Antimicrobials this admission: cefepime 8/8 >>   vancomycin 8/8 >>  Microbiology results: Pending  Seth Foster A. Levada Dy, PharmD, BCPS, FNKF Clinical Pharmacist [place] Please utilize Amion for appropriate phone number to reach the unit pharmacist ([place])

## 2021-08-16 NOTE — Progress Notes (Signed)
NAME:  Seth Foster, MRN:  [id number], DOB:  November 04, 1980, LOS: 1 ADMISSION DATE:  08/15/2021, CONSULTATION DATE:  08/15/21 REFERRING MD:  Johnney Ou, CHIEF COMPLAINT:  cough, sob  History of Present Illness:   41 yo man with a hx of Crohns disease (required resections) on adalimumap and 6Mercaptopurine, Bipolar, substance abuse d/o , latent TB,  Here with shortness of breath.  Reports he had a pleural effusion and chest tube 1 month ago at a hospital in [place].  Reports she had antibiotics and VATS, d/c with augmentin 08/04/21.   Central and r sided chest pain since discharge but sob started 3 days ago.    +Fever, sweats, green productive cough.   Has been holding adalimumab and taking tramadol for pain.    Hypoalbuminemia AST/ALT slightly elevated  WBC 16.4 Blood cultures pending.   Pertinent  Medical History  Crohns disease Bipolar disease Substance abuse hx  Latent TB  Tobacco use   Depakote Lamictal Mecaptopurine on hold with empyema Folic Acid  X73 5htp serotonin booster Augmentin started 07/28. End date 08/28 Holding adalimumab  Albuterol Robaxin Tramadol Nicotine patch   SH lives in [place] 1ppd x 20 years.    Significant Hospital Events: Including procedures, antibiotic start and stop dates in addition to other pertinent events     Interim History / Subjective:   No acute distress at rest Objective   Blood pressure 100/63, pulse (!) 56, temperature 97.6 F (36.4 C), temperature source Oral, resp. rate 20, SpO2 96 %.        Intake/Output Summary (Last 24 hours) at 08/16/2021 1046 Last data filed at 08/16/2021 0951 Gross per 24 hour  Intake 1351.38 ml  Output 1 ml  Net 1350.38 ml   There were no vitals filed for this visit.  Examination: General: 41 year old male in no acute distress on room air HEENT: MM pink/moist no JVD Neuro: Grossly intact without focal defect CV: Heart sounds are regular PULM: Diminished on the right  GI:  soft, bsx4   GU: Voids Extremities: warm/dry,  edema  Skin: no rashes or lesions, old chest tube sites are well-visualized below      Resolved Hospital Problem list     Assessment & Plan:  Complex appearing, loculated pleural effusion on R.   Recent VATS.   Thoracic consult obtained 08/16/2021.  Dr. Cyndia Bent recommends placement of pigtail catheter with instillation of tPA.  All other issues per internal medicine inpatient service Best Practice (right click and "Reselect all SmartList Selections" daily)   Labs   CBC: Recent Labs  Lab 08/15/21 0859 08/16/21 0111  WBC 16.4* 11.2*  NEUTROABS 14.2*  --   HGB 10.0* 8.8*  HCT 30.5* 26.4*  MCV 94.7 92.0  PLT 479* 403*    Basic Metabolic Panel: Recent Labs  Lab 08/15/21 0859 08/16/21 0111  NA 138 141  K 3.6 3.2*  CL 102 109  CO2 25 23  GLUCOSE 88 80  BUN 6 7  CREATININE 0.65 0.64  CALCIUM 8.6* 8.2*   GFR: CrCl cannot be calculated (Unknown ideal weight.). Recent Labs  Lab 08/15/21 0859 08/15/21 1739 08/15/21 2034 08/16/21 0111  WBC 16.4*  --   --  11.2*  LATICACIDVEN  --  2.0* 1.9  --     Liver Function Tests: Recent Labs  Lab 08/15/21 0859 08/16/21 0111  AST 52* 74*  ALT 59* 82*  ALKPHOS 116 108  BILITOT 0.3 0.3  PROT 6.7 6.0*  ALBUMIN 2.5* 2.3*  No results for input(s): "LIPASE", "AMYLASE" in the last 168 hours. No results for input(s): "AMMONIA" in the last 168 hours.  ABG No results found for: "PHART", "PCO2ART", "PO2ART", "HCO3", "TCO2", "ACIDBASEDEF", "O2SAT"   Coagulation Profile: No results for input(s): "INR", "PROTIME" in the last 168 hours.  Cardiac Enzymes: No results for input(s): "CKTOTAL", "CKMB", "CKMBINDEX", "TROPONINI" in the last 168 hours.  HbA1C: No results found for: "HGBA1C"  CBG: No results for input(s): "GLUCAP" in the last 168 hours.   Critical care time:    Richardson Landry Masyn Fullam ACNP Acute Care Nurse Practitioner [place] Please consult  Amion 08/16/2021, 10:47 AM

## 2021-08-16 NOTE — Procedures (Cosign Needed Addendum)
Pleural Fibrinolytic Administration Procedure Note  DELAN KSIAZEK  [id number]  08/13/1980  Date:08/16/21  Time:4:54 PM   Provider Performing:Toshie Demelo R Arriana Lohmann   Procedure: Pleural Fibrinolysis Initial day (25366)  Indication(s) Fibrinolysis of complicated pleural effusion  Consent Risks of the procedure as well as the alternatives and risks of each were explained to the patient and/or caregiver.  Consent for the procedure was obtained.   Anesthesia None   Time Out Verified patient identification, verified procedure, site/side was marked, verified correct patient position, special equipment/implants available, medications/allergies/relevant history reviewed, required imaging and test results available.   Sterile Technique Hand hygiene, gloves   Procedure Description Existing pleural catheter was cleaned and accessed in sterile manner.  '10mg'$  of tPA in 30cc of saline and '5mg'$  of dornase in 30cc of sterile water were injected into pleural space using existing pleural catheter.  Catheter will be clamped for 1 hour and then placed back to suction.  Injected 1650pm, tube back to suction at 1750, confirmed with RN who will put CT back to suction  Complications/Tolerance None; patient tolerated the procedure well.   EBL None   Specimen(s) None  Otilio Carpen Cadyn Rodger, PA-C

## 2021-08-17 ENCOUNTER — Inpatient Hospital Stay (HOSPITAL_COMMUNITY): Payer: Medicare Other

## 2021-08-17 DIAGNOSIS — R7401 Elevation of levels of liver transaminase levels: Secondary | ICD-10-CM | POA: Diagnosis not present

## 2021-08-17 DIAGNOSIS — J869 Pyothorax without fistula: Secondary | ICD-10-CM | POA: Diagnosis not present

## 2021-08-17 DIAGNOSIS — J188 Other pneumonia, unspecified organism: Secondary | ICD-10-CM | POA: Diagnosis not present

## 2021-08-17 DIAGNOSIS — F1190 Opioid use, unspecified, uncomplicated: Secondary | ICD-10-CM | POA: Diagnosis not present

## 2021-08-17 DIAGNOSIS — F319 Bipolar disorder, unspecified: Secondary | ICD-10-CM

## 2021-08-17 LAB — CBC
HCT: 25 % — ABNORMAL LOW (ref 39.0–52.0)
Hemoglobin: 8.2 g/dL — ABNORMAL LOW (ref 13.0–17.0)
MCH: 30.8 pg (ref 26.0–34.0)
MCHC: 32.8 g/dL (ref 30.0–36.0)
MCV: 94 fL (ref 80.0–100.0)
Platelets: 355 10*3/uL (ref 150–400)
RBC: 2.66 MIL/uL — ABNORMAL LOW (ref 4.22–5.81)
RDW: 16.8 % — ABNORMAL HIGH (ref 11.5–15.5)
WBC: 10 10*3/uL (ref 4.0–10.5)
nRBC: 0 % (ref 0.0–0.2)

## 2021-08-17 LAB — COMPREHENSIVE METABOLIC PANEL
ALT: 68 U/L — ABNORMAL HIGH (ref 0–44)
AST: 38 U/L (ref 15–41)
Albumin: 2.1 g/dL — ABNORMAL LOW (ref 3.5–5.0)
Alkaline Phosphatase: 99 U/L (ref 38–126)
Anion gap: 6 (ref 5–15)
BUN: 8 mg/dL (ref 6–20)
CO2: 20 mmol/L — ABNORMAL LOW (ref 22–32)
Calcium: 7.8 mg/dL — ABNORMAL LOW (ref 8.9–10.3)
Chloride: 113 mmol/L — ABNORMAL HIGH (ref 98–111)
Creatinine, Ser: 0.74 mg/dL (ref 0.61–1.24)
GFR, Estimated: >60 mL/min (ref >60)
Glucose, Bld: 80 mg/dL (ref 70–99)
Potassium: 3.4 mmol/L — ABNORMAL LOW (ref 3.5–5.1)
Sodium: 139 mmol/L (ref 135–145)
Total Bilirubin: 0.4 mg/dL (ref 0.3–1.2)
Total Protein: 5.4 g/dL — ABNORMAL LOW (ref 6.5–8.1)

## 2021-08-17 MED ORDER — STERILE WATER FOR INJECTION IJ SOLN
5.0000 mg | Freq: Once | INTRAMUSCULAR | Status: AC
  Administered 2021-08-17: 5 mg via INTRAPLEURAL
  Filled 2021-08-17: qty 5

## 2021-08-17 MED ORDER — SODIUM CHLORIDE (PF) 0.9 % IJ SOLN
10.0000 mg | Freq: Once | INTRAMUSCULAR | Status: AC
Start: 2021-08-17 — End: 2021-08-17
  Administered 2021-08-17: 10 mg via INTRAPLEURAL
  Filled 2021-08-17: qty 10

## 2021-08-17 MED ORDER — KETOROLAC TROMETHAMINE 15 MG/ML IJ SOLN
15.0000 mg | Freq: Four times a day (QID) | INTRAMUSCULAR | Status: AC
  Administered 2021-08-17 – 2021-08-22 (×20): 15 mg via INTRAVENOUS
  Filled 2021-08-17 (×20): qty 1

## 2021-08-17 MED ORDER — OXYCODONE HCL 5 MG PO TABS
5.0000 mg | ORAL_TABLET | Freq: Four times a day (QID) | ORAL | Status: DC | PRN
  Administered 2021-08-17: 5 mg via ORAL
  Administered 2021-08-18 (×2): 10 mg via ORAL
  Filled 2021-08-17 (×3): qty 2

## 2021-08-17 MED ORDER — OXYCODONE HCL 5 MG PO TABS
5.0000 mg | ORAL_TABLET | Freq: Three times a day (TID) | ORAL | Status: DC | PRN
  Administered 2021-08-17: 10 mg via ORAL
  Filled 2021-08-17: qty 2

## 2021-08-17 NOTE — Progress Notes (Addendum)
HD#2 Subjective:   Summary: Seth Foster is a 41 year old male with a history of Crohn's disease s/p multiple surgical extractions previously on adalimumab and 6 mercaptopurine, bipolar disorder type II, substance use disorder, latent TB with 2 negative test, tobacco use disorder and recent treatment of empyema on the right lung with VATS who presented with 3 days of worsening pleuritic chest pain and productive cough and is admitted for right lung empyema.  Overnight Events: After chest tube spent the patient had more chest wall pain and overnight team discontinued ibuprofen and added Toradol as well as increased the frequency of tramadol.  Today he is sitting up in bed and states that he is doing better overall.  He is still having some pain around his chest but states that the changes in his medication overnight helped a lot.  He thinks that his breathing is doing better and he denies any new or worsening complaints.  Objective:  Vital signs in last 24 hours: Vitals:   08/17/21 0400 08/17/21 0403 08/17/21 0755 08/17/21 1227  BP: 104/63  102/61 (!) 100/57  Pulse: 60 60 (!) 57 67  Resp:  '18 20 20  '$ Temp: 97.9 F (36.6 C)  98.2 F (36.8 C)   TempSrc: Oral  Oral   SpO2: 95% 96% 97% 97%  Weight:      Height:       Supplemental O2: Room Air SpO2: 97 %   Physical Exam:  Constitutional: Middle-age male who appears older than stated age sitting up in bed, in no acute distress Cardiovascular: regular rate and rhythm Pulmonary/Chest: Mildly tachypneic without any increased work of breathing, decreased lung sounds throughout the right lung and diffuse coarse breath sounds better heard on the left Abdominal: soft and nondistended MSK: Thin, no lower extremity edema or swollen joints Neurological: alert & oriented x 3 Skin: warm and dry, chest tube in place in right posterior mid scapular line draining into a suction device, does not appear to be any leaks or new or worsening  surrounding erythema or edema.  Stable chest tube incisions on the right lateral rib cage  Filed Weights   08/16/21 1343  Weight: 62.4 kg     Intake/Output Summary (Last 24 hours) at 08/17/2021 1410 Last data filed at 08/17/2021 3295 Gross per 24 hour  Intake 261.54 ml  Output 535 ml  Net -273.46 ml   Net IO Since Admission: 1,696.92 mL [08/17/21 1410]  Pertinent Labs:    Latest Ref Rng & Units 08/17/2021    7:30 AM 08/16/2021    1:11 AM 08/15/2021    8:59 AM  CBC  WBC 4.0 - 10.5 K/uL 10.0  11.2  16.4   Hemoglobin 13.0 - 17.0 g/dL 8.2  8.8  10.0   Hematocrit 39.0 - 52.0 % 25.0  26.4  30.5   Platelets 150 - 400 K/uL 355  403  479        Latest Ref Rng & Units 08/17/2021    7:30 AM 08/16/2021    1:11 AM 08/15/2021    8:59 AM  CMP  Glucose 70 - 99 mg/dL 80  80  88   BUN 6 - 20 mg/dL '8  7  6   '$ Creatinine 0.61 - 1.24 mg/dL 0.74  0.64  0.65   Sodium 135 - 145 mmol/L 139  141  138   Potassium 3.5 - 5.1 mmol/L 3.4  3.2  3.6   Chloride 98 - 111 mmol/L 113  109  102  CO2 22 - 32 mmol/L '20  23  25   '$ Calcium 8.9 - 10.3 mg/dL 7.8  8.2  8.6   Total Protein 6.5 - 8.1 g/dL 5.4  6.0  6.7   Total Bilirubin 0.3 - 1.2 mg/dL 0.4  0.3  0.3   Alkaline Phos 38 - 126 U/L 99  108  116   AST 15 - 41 U/L 38  74  52   ALT 0 - 44 U/L 68  82  59     Imaging: DG CHEST PORT 1 VIEW  Result Date: 08/17/2021 CLINICAL DATA:  Empyema. EXAM: PORTABLE CHEST 1 VIEW COMPARISON:  August 16, 2021. FINDINGS: The heart size and mediastinal contours are within normal limits. Stable position of right-sided pleural drainage catheter. Stable right basilar atelectasis or infiltrate is noted with small associated pleural effusion. Stable left basilar atelectasis or infiltrate. The visualized skeletal structures are unremarkable. IMPRESSION: Stable position of right-sided pleural drainage catheter, with stable right basilar atelectasis or infiltrate with associated small pleural effusion. Electronically Signed   By: Marijo Conception M.D.   On: 08/17/2021 08:20   DG CHEST PORT 1 VIEW  Result Date: 08/16/2021 CLINICAL DATA:  Chest tube placement EXAM: PORTABLE CHEST 1 VIEW COMPARISON:  Chest x-ray dated August 15, 2021 FINDINGS: Cardiac and mediastinal contours are within normal limits. Interval right-sided chest tube. Decreased size of right pleural effusion. No evidence of pneumothorax. Nodular opacities of the bilateral lower lungs, better evaluated on recent prior chest CT. IMPRESSION: Decreased size of right pleural effusion status post chest tube placement. Electronically Signed   By: Yetta Glassman M.D.   On: 08/16/2021 15:51    Assessment/Plan:   Principal Problem:   Empyema of right pleural space [facility])   Patient Summary: Seth Foster is a 41 year old male with a history of Crohn's disease s/p multiple surgical extractions previously on adalimumab and 6 mercaptopurine, bipolar disorder type II, substance use disorder, latent TB with 2 negative test, tobacco use disorder and recent treatment of empyema on the right lung with VATS who presented with 3 days of worsening pleuritic chest pain and productive cough and is admitted for right lung empyema.     Right sided complex empyema Multifocal pneumonia Recent admission in [place] for right sided empyema treated with chest tube, VATS and antibiotics. He was discharged home with Augmentin with plan to complete on 09/04/2021.  Culture results from [facility] showed strep intermedius that was too fastidious to get sensitivities.  Imaging consistent with recurrent empyema and multifocal pneumonia.  PCCM placed chest tube on the right with suction draining. -continue vancomycin and cefepime day 3 -PCCM and CT surgery consulted.  Continue with pigtail drain with alteplase/dornase alpha -trend wbc -blood cultures and pleural fluid culture pending -robaxin, tramadol, Tylenol, and Toradol for chest wall pain   Transaminitis  Improving with AST of 38 and ALT of  68.   EKG Changes Prior Ekg from 2021, new findings QRS findings in lead 1/3 and t wave inversions in 3. Possible secondary to severe pulmonary infection.  Echo here showed a moderately enlarged right ventricle without elevation of right atrial pressure and an EF of 50 to 55% with global hypokinesis left ventricle.  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  Will consider discussing this with pulmonary and/or cardiology to understand further about significance.  We do recommend that they  be a follow-up echocardiogram after admission.   Crohn's disease Recently seen by his gastroenterologist. Will continue to hold adalimumab and 46m in setting of empyema.    Bipolar disorder Continue Depakote and Lamictal   Opioid use disorder Currently working with PCP and psychiatrist to find medical therapies. Trialed on suboxone and did not find useful, felt xanax helped more. Driving factor is increased anxiety.  -continue to follow up with PCP -Tramadol, Robaxin, toradol, Tylenol for pain   Hx of positive quantiferon-TB Negative follow up x2, consistent with latent TB.    Tobacco use disorder Nicotine patch daily  Diet: Normal IVF: None,None VTE: Enoxaparin Code: Full   Dispo: Anticipated discharge pending further evaluation and treatment.  [place] CoveInternal Medicine Resident PGY-1 Pager: [phone] Please contact the on call pager after 5 pm and on weekends at [phone]

## 2021-08-17 NOTE — Procedures (Signed)
Pleural Fibrinolytic Administration Procedure Note  Seth Foster  [id number]  10/28/1980  Date:08/17/21  Time:11:21 AM   Provider Performing:[name] [name]   Procedure: Pleural Fibrinolysis Subsequent day [phone])  (Day 2)   Indication(s) Fibrinolysis of complicated pleural effusion  Consent Risks of the procedure as well as the alternatives and risks of each were explained to the patient and/or caregiver.  Consent for the procedure was obtained.   Anesthesia None   Time Out Verified patient identification, verified procedure, site/side was marked, verified correct patient position, special equipment/implants available, medications/allergies/relevant history reviewed, required imaging and test results available.   Sterile Technique Hand hygiene, gloves   Procedure Description Existing pleural catheter was cleaned and accessed in sterile manner.  '10mg'$  of tPA in 30cc of saline and '5mg'$  of dornase in 30cc of sterile water were injected into pleural space using existing pleural catheter.  Catheter will be clamped for 1 hour and then placed back to suction.  Instillation at 11:12 Discussed with RN -- please unclamp chest tube and open stopcock at 82:64   Complications/Tolerance None; patient tolerated the procedure well.  EBL None  Specimen(s) None    [name] MSN, AGACNP-BC [place] for pager 08/17/2021, 11:22 AM

## 2021-08-17 NOTE — Progress Notes (Signed)
Patients stopcock open and chest tube unclamped.

## 2021-08-17 NOTE — Progress Notes (Signed)
NAME:  Seth Foster, MRN:  [id number], DOB:  Dec 25, 1980, LOS: 2 ADMISSION DATE:  08/15/2021, CONSULTATION DATE:  08/15/21 REFERRING MD:  Johnney Ou, CHIEF COMPLAINT:  cough, sob  History of Present Illness:   41 yo man with a hx of Crohns disease (required resections) on adalimumap and 6Mercaptopurine, Bipolar, substance abuse d/o , latent TB,  Here with shortness of breath.  Reports he had a pleural effusion and chest tube 1 month ago at a hospital in [place].  Reports she had antibiotics and VATS, d/c with augmentin 08/04/21.   Central and r sided chest pain since discharge but sob started 3 days ago.    +Fever, sweats, green productive cough.   Has been holding adalimumab and taking tramadol for pain.    Hypoalbuminemia AST/ALT slightly elevated  WBC 16.4 Blood cultures pending.   Pertinent  Medical History  Crohns disease Bipolar disease Substance abuse hx  Latent TB  Tobacco use   Depakote Lamictal Mecaptopurine on hold with empyema Folic Acid  P95 5htp serotonin booster Augmentin started 07/28. End date 08/28 Holding adalimumab  Albuterol Robaxin Tramadol Nicotine patch   SH lives in [place] 1ppd x 20 years.    Significant Hospital Events: Including procedures, antibiotic start and stop dates in addition to other pertinent events   8/8 pulm consult rec CVTS given recent VATS 8/9 CVTS consult, rec chest tube + lytics. PCCM placed chest tube, d1 lytics 8/10 d2 lytics   Interim History / Subjective:   Had poorly controlled pain yesterday evening/overnight   Objective   Blood pressure 102/61, pulse (!) 57, temperature 98.2 F (36.8 C), temperature source Oral, resp. rate 20, height '5\' 11"'$  (1.803 m), weight 62.4 kg, SpO2 97 %.        Intake/Output Summary (Last 24 hours) at 08/17/2021 1126 Last data filed at 08/17/2021 0932 Gross per 24 hour  Intake 361.54 ml  Output 535 ml  Net -173.46 ml   Filed Weights   08/16/21 1343  Weight: 62.4 kg     Examination: General: adult M NAD  HEENT: NCAT pink mm poor dentition  Neuro:AAOx4  CV: regular, cap refill < 3 sec  PULM: R sided chest tube with 431m output GI: thin ndnt  GU: defer  Extremities: no acute joint deformity  Skin: c/d/w. Scattered healed tattoos    Resolved Hospital Problem list     Assessment & Plan:    Recurrent loculated R sided pleural effusion -- S/p VATS / decort (07/2021 at OSH, dc on augmentin) Recent strep intermedius RLL PNA, in immunocompromised host  -now s/p R sided pigtail 8/9 with PCCM -CXR 8/10 with R > L basilar opacity (atelectasis vs infiltrate?) and small R pleural effusion, stable pigtail  P -CVTS was consulted 8/9 and rec chest tube + lytics if indicated -Day 2 lytics 8/10  -follow pleural fluid studies  -vanc, cefepime  -cont multimodal pain management -- increasing range on oxy to 5-'10mg'$   All other issues per internal medicine inpatient service   Best Practice (right click and "Reselect all SmartList Selections" daily)   Labs   CBC: Recent Labs  Lab 08/15/21 0859 08/16/21 0111 08/17/21 0730  WBC 16.4* 11.2* 10.0  NEUTROABS 14.2*  --   --   HGB 10.0* 8.8* 8.2*  HCT 30.5* 26.4* 25.0*  MCV 94.7 92.0 94.0  PLT 479* 403* 3671   Basic Metabolic Panel: Recent Labs  Lab 08/15/21 0859 08/16/21 0111 08/17/21 0730  NA 138 141 139  K 3.6 3.2* 3.4*  CL 102 109 113*  CO2 25 23 20*  GLUCOSE 88 80 80  BUN '6 7 8  '$ CREATININE 0.65 0.64 0.74  CALCIUM 8.6* 8.2* 7.8*   GFR: Estimated Creatinine Clearance: 109.4 mL/min (by C-G formula based on SCr of 0.74 mg/dL). Recent Labs  Lab 08/15/21 0859 08/15/21 1739 08/15/21 2034 08/16/21 0111 08/17/21 0730  WBC 16.4*  --   --  11.2* 10.0  LATICACIDVEN  --  2.0* 1.9  --   --     Liver Function Tests: Recent Labs  Lab 08/15/21 0859 08/16/21 0111 08/17/21 0730  AST 52* 74* 38  ALT 59* 82* 68*  ALKPHOS 116 108 99  BILITOT 0.3 0.3 0.4  PROT 6.7 6.0* 5.4*  ALBUMIN 2.5*  2.3* 2.1*   No results for input(s): "LIPASE", "AMYLASE" in the last 168 hours. No results for input(s): "AMMONIA" in the last 168 hours.  ABG No results found for: "PHART", "PCO2ART", "PO2ART", "HCO3", "TCO2", "ACIDBASEDEF", "O2SAT"   Coagulation Profile: No results for input(s): "INR", "PROTIME" in the last 168 hours.  Cardiac Enzymes: No results for input(s): "CKTOTAL", "CKMB", "CKMBINDEX", "TROPONINI" in the last 168 hours.  HbA1C: No results found for: "HGBA1C"  CBG: No results for input(s): "GLUCAP" in the last 168 hours.   [name] MSN, AGACNP-BC [place] for pager  08/17/2021, 11:26 AM

## 2021-08-18 ENCOUNTER — Inpatient Hospital Stay (HOSPITAL_COMMUNITY): Payer: Medicare Other

## 2021-08-18 DIAGNOSIS — J90 Pleural effusion, not elsewhere classified: Secondary | ICD-10-CM | POA: Diagnosis not present

## 2021-08-18 DIAGNOSIS — R7401 Elevation of levels of liver transaminase levels: Secondary | ICD-10-CM | POA: Diagnosis not present

## 2021-08-18 DIAGNOSIS — F1190 Opioid use, unspecified, uncomplicated: Secondary | ICD-10-CM | POA: Diagnosis not present

## 2021-08-18 DIAGNOSIS — J869 Pyothorax without fistula: Secondary | ICD-10-CM | POA: Diagnosis not present

## 2021-08-18 DIAGNOSIS — J188 Other pneumonia, unspecified organism: Secondary | ICD-10-CM | POA: Diagnosis not present

## 2021-08-18 LAB — COMPREHENSIVE METABOLIC PANEL
ALT: 56 U/L — ABNORMAL HIGH (ref 0–44)
AST: 27 U/L (ref 15–41)
Albumin: 2.2 g/dL — ABNORMAL LOW (ref 3.5–5.0)
Alkaline Phosphatase: 93 U/L (ref 38–126)
Anion gap: 6 (ref 5–15)
BUN: 10 mg/dL (ref 6–20)
CO2: 20 mmol/L — ABNORMAL LOW (ref 22–32)
Calcium: 7.7 mg/dL — ABNORMAL LOW (ref 8.9–10.3)
Chloride: 112 mmol/L — ABNORMAL HIGH (ref 98–111)
Creatinine, Ser: 0.73 mg/dL (ref 0.61–1.24)
GFR, Estimated: >60 mL/min (ref >60)
Glucose, Bld: 88 mg/dL (ref 70–99)
Potassium: 3.4 mmol/L — ABNORMAL LOW (ref 3.5–5.1)
Sodium: 138 mmol/L (ref 135–145)
Total Bilirubin: 0.3 mg/dL (ref 0.3–1.2)
Total Protein: 5.6 g/dL — ABNORMAL LOW (ref 6.5–8.1)

## 2021-08-18 LAB — CBC
HCT: 26.4 % — ABNORMAL LOW (ref 39.0–52.0)
Hemoglobin: 8.9 g/dL — ABNORMAL LOW (ref 13.0–17.0)
MCH: 31.2 pg (ref 26.0–34.0)
MCHC: 33.7 g/dL (ref 30.0–36.0)
MCV: 92.6 fL (ref 80.0–100.0)
Platelets: 344 10*3/uL (ref 150–400)
RBC: 2.85 MIL/uL — ABNORMAL LOW (ref 4.22–5.81)
RDW: 16.7 % — ABNORMAL HIGH (ref 11.5–15.5)
WBC: 8.9 10*3/uL (ref 4.0–10.5)
nRBC: 0 % (ref 0.0–0.2)

## 2021-08-18 MED ORDER — METHOCARBAMOL 500 MG PO TABS
1000.0000 mg | ORAL_TABLET | Freq: Four times a day (QID) | ORAL | Status: DC
Start: 2021-08-18 — End: 2021-08-22
  Administered 2021-08-18 – 2021-08-22 (×16): 1000 mg via ORAL
  Filled 2021-08-18 (×16): qty 2

## 2021-08-18 MED ORDER — SODIUM CHLORIDE 0.9% FLUSH: 10.0000 mL | Freq: Three times a day (TID) | INTRAVENOUS | Status: DC

## 2021-08-18 MED ORDER — OXYCODONE HCL 5 MG PO TABS
5.0000 mg | ORAL_TABLET | Freq: Four times a day (QID) | ORAL | Status: DC | PRN
  Administered 2021-08-18: 10 mg via ORAL
  Administered 2021-08-18: 5 mg via ORAL
  Administered 2021-08-19 – 2021-08-22 (×12): 10 mg via ORAL
  Filled 2021-08-18 (×14): qty 2

## 2021-08-18 MED ORDER — ALPRAZOLAM 0.5 MG PO TABS
0.5000 mg | ORAL_TABLET | Freq: Two times a day (BID) | ORAL | Status: DC | PRN
  Administered 2021-08-18 – 2021-08-20 (×3): 0.5 mg via ORAL
  Filled 2021-08-18 (×3): qty 1

## 2021-08-18 MED ORDER — STERILE WATER FOR INJECTION IJ SOLN
5.0000 mg | Freq: Once | RESPIRATORY_TRACT | Status: AC
  Administered 2021-08-18: 5 mg via INTRAPLEURAL
  Filled 2021-08-18: qty 5

## 2021-08-18 MED ORDER — ALTEPLASE 2 MG IJ SOLR
10.0000 mg | Freq: Once | INTRAMUSCULAR | Status: AC
Start: 2021-08-18 — End: 2021-08-18
  Administered 2021-08-18: 10 mg via INTRAPLEURAL
  Filled 2021-08-18: qty 10

## 2021-08-18 MED ORDER — OXYCODONE HCL 5 MG PO TABS
10.0000 mg | ORAL_TABLET | Freq: Four times a day (QID) | ORAL | Status: DC | PRN
Start: 2021-08-18 — End: 2021-08-18

## 2021-08-18 NOTE — Procedures (Signed)
Pleural Fibrinolytic Administration Procedure Note  PAPE PARSON  [id number]  23-Jul-1980  Date:08/18/21  Time:6:40 PM   Provider Performing:Charell Faulk L Angel Hobdy   Procedure: Pleural Fibrinolysis Subsequent day (57972)  Indication(s) Fibrinolysis of complicated pleural effusion  Consent Risks of the procedure as well as the alternatives and risks of each were explained to the patient and/or caregiver.  Consent for the procedure was obtained.  Anesthesia None  Time Out Verified patient identification, verified procedure, site/side was marked, verified correct patient position, special equipment/implants available, medications/allergies/relevant history reviewed, required imaging and test results available.  Sterile Technique Hand hygiene, gloves  Procedure Description Existing pleural catheter was cleaned and accessed in sterile manner.  '10mg'$  of tPA in 30cc of saline and '5mg'$  of dornase in 30cc of sterile water were injected into pleural space using existing pleural catheter.  Catheter will be clamped for 1 hour and then placed back to suction.  Complications/Tolerance None; patient tolerated the procedure well.  EBL None  Specimen(s) None   Garner Nash, DO [place] [facility] 08/18/2021 6:40 PM

## 2021-08-18 NOTE — Progress Notes (Signed)
NAME:  Seth Foster, MRN:  [id number], DOB:  [birth year], LOS: 3 ADMISSION DATE:  08/15/2021, CONSULTATION DATE:  08/15/21 REFERRING MD:  Johnney Ou, CHIEF COMPLAINT:  cough, sob  History of Present Illness:   41 yo man with a hx of Crohns disease (required resections) on adalimumap and 6Mercaptopurine, Bipolar, substance abuse d/o , latent TB,  Here with shortness of breath.  Reports he had a pleural effusion and chest tube 1 month ago at a hospital in [place].  Reports she had antibiotics and VATS, d/c with augmentin 08/04/21.   Central and r sided chest pain since discharge but sob started 3 days ago.    +Fever, sweats, green productive cough.   Has been holding adalimumab and taking tramadol for pain.    Hypoalbuminemia AST/ALT slightly elevated  WBC 16.4 Blood cultures pending.   Pertinent  Medical History  Crohns disease Bipolar disease Substance abuse hx  Latent TB  Tobacco use   Depakote Lamictal Mecaptopurine on hold with empyema Folic Acid  W09 5htp serotonin booster Augmentin started 07/28. End date 08/28 Holding adalimumab  Albuterol Robaxin Tramadol Nicotine patch   SH lives in [place] 1ppd x 20 years.    Significant Hospital Events: Including procedures, antibiotic start and stop dates in addition to other pertinent events   8/8 pulm consult rec CVTS given recent VATS 8/9 CVTS consult, rec chest tube + lytics. PCCM placed chest tube, d1 lytics 8/10 d2 lytics   Interim History / Subjective:   Pain better still not optimal -- says robaxin is helping, inquired in fq can change  Feels anxious -- did better at OSH with low dose xanax  Breathing better  About 300 out from chest tube in interval    Objective   Blood pressure 99/62, pulse 65, temperature 97.9 F (36.6 C), temperature source Oral, resp. rate 20, height '5\' 11"'$  (1.803 m), weight 62.4 kg, SpO2 97 %.        Intake/Output Summary (Last 24 hours) at 08/18/2021 1446 Last data  filed at 08/18/2021 1340 Gross per 24 hour  Intake 310 ml  Output 175 ml  Net 135 ml   Filed Weights   08/16/21 1343  Weight: 62.4 kg    Examination: General: chronically ill in good spirits  HEENT: NCAT poor dentition  Neuro: AAOx4  CV:  regular, cap refill <3 PULM: R sided chest tube with about 300 out in interval WJ:XBJY ndnt  GU:  defer  Extremities: no acute deformity  Skin: c/d/w   Resolved Hospital Problem list     Assessment & Plan:    Recurrent loculated R sided pleural effusion -- S/p VATS / decort (07/2021 at OSH, dc on augmentin) Recent strep intermedius RLL PNA, in immunocompromised host  -now s/p R sided pigtail 8/9 with PCCM -CXR 8/10 with R > L basilar opacity (atelectasis vs infiltrate?) and small R pleural effusion, stable pigtail  P -CVTS was consulted 8/9 and rec chest tube + lytics  -Day 3 lytics 8/11 -follow pleural fluid studies -- no organisms at this point  -vanc, cefepime  -cont multimodal pain management --  changed robaxin to QID and changed anxiolytic from atarax to xanax  -pulm hygiene, IS   All other issues per internal medicine inpatient service   Best Practice (right click and "Reselect all SmartList Selections" daily)   Labs   CBC: Recent Labs  Lab 08/15/21 0859 08/16/21 0111 08/17/21 0730 08/18/21 0319  WBC 16.4* 11.2* 10.0 8.9  NEUTROABS 14.2*  --   --   --  HGB 10.0* 8.8* 8.2* 8.9*  HCT 30.5* 26.4* 25.0* 26.4*  MCV 94.7 92.0 94.0 92.6  PLT 479* 403* 355 435    Basic Metabolic Panel: Recent Labs  Lab 08/15/21 0859 08/16/21 0111 08/17/21 0730 08/18/21 0319  NA 138 141 139 138  K 3.6 3.2* 3.4* 3.4*  CL 102 109 113* 112*  CO2 25 23 20* 20*  GLUCOSE 88 80 80 88  BUN '6 7 8 10  '$ CREATININE 0.65 0.64 0.74 0.73  CALCIUM 8.6* 8.2* 7.8* 7.7*   GFR: Estimated Creatinine Clearance: 109.4 mL/min (by C-G formula based on SCr of 0.73 mg/dL). Recent Labs  Lab 08/15/21 0859 08/15/21 1739 08/15/21 2034 08/16/21 0111  08/17/21 0730 08/18/21 0319  WBC 16.4*  --   --  11.2* 10.0 8.9  LATICACIDVEN  --  2.0* 1.9  --   --   --     Liver Function Tests: Recent Labs  Lab 08/15/21 0859 08/16/21 0111 08/17/21 0730 08/18/21 0319  AST 52* 74* 38 27  ALT 59* 82* 68* 56*  ALKPHOS 116 108 99 93  BILITOT 0.3 0.3 0.4 0.3  PROT 6.7 6.0* 5.4* 5.6*  ALBUMIN 2.5* 2.3* 2.1* 2.2*   No results for input(s): "LIPASE", "AMYLASE" in the last 168 hours. No results for input(s): "AMMONIA" in the last 168 hours.  ABG No results found for: "PHART", "PCO2ART", "PO2ART", "HCO3", "TCO2", "ACIDBASEDEF", "O2SAT"   Coagulation Profile: No results for input(s): "INR", "PROTIME" in the last 168 hours.  Cardiac Enzymes: No results for input(s): "CKTOTAL", "CKMB", "CKMBINDEX", "TROPONINI" in the last 168 hours.  HbA1C: No results found for: "HGBA1C"  CBG: No results for input(s): "GLUCAP" in the last 168 hours.   [name] MSN, AGACNP-BC [place] for pager  08/18/2021, 2:46 PM

## 2021-08-18 NOTE — Hospital Course (Signed)
Thank you Seth Foster is a 41 year old male with a history of Crohn's disease s/p multiple surgical extractions previously on adalimumab and 6 mercaptopurine, bipolar disorder type II, substance use disorder, latent TB with 2 negative test, tobacco use disorder and recent treatment of empyema on the right lung with VATS who presented with 3 days of worsening pleuritic chest pain and productive cough and is admitted for right lung empyema.     Right sided complex empyema Multifocal pneumonia He was recently admitted at [facility] in [place] for right-sided empyema with cultures that grew strep intermedius with sensitivity to ampicillin, ceftriaxone, cefotaxime, penicillin G, and vancomycin.  He was discharged on 08/04/2021 after antibiotic treatment, chest tubes, and VATS decortication on Augmentin with a stop date of 09/04/2021. He had return of pleuritic chest pain right > left and dyspnea. Imaging here show recurrence/persistence of the right sided empyema.  On admission he was tachypneic and definitely uncomfortable but overall hemodynamically stable and concern for sepsis was overall low with lactic of 2->1.9. he was put on Vanc/Cefepime on 08/15/2021. PulmCrit and CT surgery were consulted. Chest tube was placed on 8/9 and he had 3 rounds of lytic therapy with over 1 L output from the chest tube before output decreased. Chest tube removed on 8/13.  MRSA swab negative and patient showed improvement without anaerobic coverage.  ID was consulted and recommended Augmentin to complete a 4 week course of antibiotics.  Blood and pleural fluid cultures remain negative here.  Patient showed steady improvement during admission and was felt safe to discharge home on oral antibiotics with close outpatient follow-up with PCP, PCCM, and psychiatry.  Total antibiotic course was Vanco and cefepime for *** days and Augmentin for X days to complete a 4-week course with a stop date of 09/12/2021.  Pleuritic  chest pain Opioid use disorder Patient has a history of opiate use disorder with use of IV opiates in the past, reported last over 5 years ago.  Currently working with PCP and psychiatrist to find medical therapies. Trialed on suboxone and did not find useful, felt xanax helped more as primary Drive of decreasing anxiety rather than analgesia or euphoria.  His pain was difficult to control during his stay as he required a chest tube.  We did get adequate control with tramadol, Robaxin, Toradol, oxycodone, and the end we added topical Voltaren and lidocaine patches after chest tube removal.  EKG Changes Prior Ekg from 2021, new findings QRS findings in lead 1/3 and t wave inversions in 3. Possible secondary to severe pulmonary infection.  Echo here showed a moderately enlarged right ventricle without elevation of right atrial pressure and an EF of 50 to 55% with global hypokinesis left ventricle.  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 We do recommend that there be a follow-up echocardiogram after discharge.   Crohn's disease To admission he was recently seen by his gastroenterologist.  During his stay at [facility] he was recommended to hold his adalimumab and 6-mercaptopurine.  Will continue to hold adalimumab and 69m in setting of ongoing infection.  Patient will be set to resume therapy either after completion of antibiotics or at the discretion of his gastroenterologist.   Bipolar disorder type 2 He has been stable on Depakote and Lamictal prior to admission.  At symptom  they were giving him Xanax for his anxiety and they discharged him with a short course of this.  He complained of anxiety during his stay and Xanax 0.5 mg BID prn added by PCCM.  We discussed our practices to not typically send patients home with  benzodiazepines and he was understanding of our position. He will plan to follow up outpatient for permanent changes to meds.  We also gave him hydroxyzine as needed which he noted helped a bit. *** We will consider Klonopin on discharge if he is requiring enough as needed Xanax to make is worried about withdrawals.     Hx of positive quantiferon-TB Documentation of negative follow up to be testing x2, consistent with latent TB.  No signs of active TB disease during or prior to admission.   Tobacco use disorder Notes he quit smoking 2 months ago but prior to that was smoking 1 pack/day for roughly 20 years.  Stay he did well with a nicotine patch daily.  We encouraged tobacco cessation and encouraged him to discuss this with his primary care physician after discharge.   Transaminitis  Admitted within AST of 74 and ALT of 82.  Low concern for seeding of the liver but we monitored this and watch for any new right upper quadrant pain.  His transaminitis resolved and he did not show any signs of right upper quadrant pain that would have indicated [place] to get a right upper quadrant ultrasound during his admission.

## 2021-08-18 NOTE — Progress Notes (Addendum)
HD#3 Subjective:   Summary: Seth Foster is a 41 year old male with a history of Crohn's disease s/p multiple surgical extractions previously on adalimumab and 6 mercaptopurine, bipolar disorder type II, substance use disorder, latent TB with 2 negative test, tobacco use disorder and recent treatment of empyema on the right lung with VATS who presented with 3 days of worsening pleuritic chest pain and productive cough and is admitted for right lung empyema.  Overnight Events: Continued chest wall pain and OxyContin was increased to every 6 hours.  He had 5 mg at 930 last night and 10 mg at 5 AM.  Patient was doing well today and states that the intravenous pain medication overnight to Q6 oxycodone worked well for his pain.  States his breathing continues to improve and denies any coughing.  Denies any other new or worsening symptoms.  Work 12 hours Objective:  Vital signs in last 24 hours: Vitals:   08/17/21 2135 08/18/21 0800 08/18/21 0802 08/18/21 1140  BP: 110/66  (!) 99/56 99/62  Pulse:    65  Resp: '15  18 20  '$ Temp:  97.9 F (36.6 C) 97.9 F (36.6 C)   TempSrc:  Oral Oral   SpO2:   100% 97%  Weight:      Height:       Supplemental O2: Room Air SpO2: 97 %   Physical Exam:  Constitutional: Middle-age male who appears older than stated age sitting up in bed, in no acute distress Cardiovascular: regular rate and rhythm Pulmonary/Chest: Normal work of breathing, mildly decreased lung sounds throughout right lung worse in the base, coarse breath sounds bilaterally.   Abdominal: soft and nondistended MSK: Thin, no lower extremity edema or swollen joints Neurological: alert & oriented x 3 Skin: warm and dry, chest tube in place in right posterior mid scapular line draining into a suction device, does not appear to be any leaks or new or worsening surrounding erythema or edema.  Stable chest tube incisions on the right lateral rib cage  Filed Weights   08/16/21 1343   Weight: 62.4 kg     Intake/Output Summary (Last 24 hours) at 08/18/2021 1343 Last data filed at 08/18/2021 0940 Gross per 24 hour  Intake 210 ml  Output 175 ml  Net 35 ml   Net IO Since Admission: 1,831.92 mL [08/18/21 1343]  Pertinent Labs:    Latest Ref Rng & Units 08/18/2021    3:19 AM 08/17/2021    7:30 AM 08/16/2021    1:11 AM  CBC  WBC 4.0 - 10.5 K/uL 8.9  10.0  11.2   Hemoglobin 13.0 - 17.0 g/dL 8.9  8.2  8.8   Hematocrit 39.0 - 52.0 % 26.4  25.0  26.4   Platelets 150 - 400 K/uL 344  355  403        Latest Ref Rng & Units 08/18/2021    3:19 AM 08/17/2021    7:30 AM 08/16/2021    1:11 AM  CMP  Glucose 70 - 99 mg/dL 88  80  80   BUN 6 - 20 mg/dL '10  8  7   '$ Creatinine 0.61 - 1.24 mg/dL 0.73  0.74  0.64   Sodium 135 - 145 mmol/L 138  139  141   Potassium 3.5 - 5.1 mmol/L 3.4  3.4  3.2   Chloride 98 - 111 mmol/L 112  113  109   CO2 22 - 32 mmol/L '20  20  23   '$ Calcium 8.9 -  10.3 mg/dL 7.7  7.8  8.2   Total Protein 6.5 - 8.1 g/dL 5.6  5.4  6.0   Total Bilirubin 0.3 - 1.2 mg/dL 0.3  0.4  0.3   Alkaline Phos 38 - 126 U/L 93  99  108   AST 15 - 41 U/L 27  38  74   ALT 0 - 44 U/L 56  68  82     Imaging: No results found.  Assessment/Plan:   Principal Problem:   Empyema of right pleural space [facility])   Patient Summary: Seth Foster is a 41 year old male with a history of Crohn's disease s/p multiple surgical extractions previously on adalimumab and 6 mercaptopurine, bipolar disorder type II, substance use disorder, latent TB with 2 negative test, tobacco use disorder and recent treatment of empyema on the right lung with VATS who presented with 3 days of worsening pleuritic chest pain and productive cough and is admitted for right lung empyema.     Right sided complex empyema Multifocal pneumonia Recent admission in [place] for right sided empyema treated with chest tube, VATS and antibiotics. He was discharged home with Augmentin with plan to complete on 09/04/2021.   Culture results from [facility] showed strep intermedius that was too fastidious to get sensitivities.  Imaging consistent with recurrent empyema and multifocal pneumonia.  PCCM placed chest tube on the right with suction draining. -continue vancomycin and cefepime day 4 -PCCM and CT surgery consulted.  Continue with pigtail drain with alteplase/dornase alpha per PCCM -trend wbc -blood cultures and pleural fluid culture pending, currently negative -robaxin, tramadol, Tylenol, oxycodone, and Toradol for chest wall pain   Transaminitis  Improving with AST of 7 and ALT of 56.   EKG Changes Prior Ekg from 2021, new findings QRS findings in lead 1/3 and t wave inversions in 3. Possible secondary to severe pulmonary infection.  Echo here showed a moderately enlarged right ventricle without elevation of right atrial pressure and an EF of 50 to 55% with global hypokinesis left ventricle.  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 We do recommend that they be a follow-up echocardiogram after admission.   Crohn's disease Recently seen by his gastroenterologist. Will continue to hold adalimumab and 6m in setting of empyema.    Bipolar disorder Continue Depakote and Lamictal   Opioid use disorder Currently working with PCP and psychiatrist to find medical therapies. Trialed on suboxone and did not find useful, felt xanax helped more. Driving factor is increased anxiety.  -continue to follow up with PCP -Tramadol, Robaxin, toradol, Tylenol for pain   Hx of positive quantiferon-TB Negative follow up x2, consistent with latent TB.    Tobacco use disorder Nicotine patch daily   Diet: Normal IVF: None,None VTE: Enoxaparin Code: Full     Dispo: Anticipated discharge pending further evaluation and treatment.  JSouth TaftInternal Medicine Resident PGY-1 Pager: [phone]  Please contact the on call pager after 5 pm and on weekends at [phone]

## 2021-08-19 ENCOUNTER — Encounter (HOSPITAL_COMMUNITY): Payer: Self-pay | Admitting: Pulmonary Disease

## 2021-08-19 ENCOUNTER — Inpatient Hospital Stay (HOSPITAL_COMMUNITY): Payer: Medicare Other

## 2021-08-19 DIAGNOSIS — J869 Pyothorax without fistula: Secondary | ICD-10-CM | POA: Diagnosis not present

## 2021-08-19 LAB — CBC
HCT: 25.3 % — ABNORMAL LOW (ref 39.0–52.0)
Hemoglobin: 8.4 g/dL — ABNORMAL LOW (ref 13.0–17.0)
MCH: 30.9 pg (ref 26.0–34.0)
MCHC: 33.2 g/dL (ref 30.0–36.0)
MCV: 93 fL (ref 80.0–100.0)
Platelets: 352 10*3/uL (ref 150–400)
RBC: 2.72 MIL/uL — ABNORMAL LOW (ref 4.22–5.81)
RDW: 17.1 % — ABNORMAL HIGH (ref 11.5–15.5)
WBC: 10.4 10*3/uL (ref 4.0–10.5)
nRBC: 0 % (ref 0.0–0.2)

## 2021-08-19 LAB — COMPREHENSIVE METABOLIC PANEL
ALT: 38 U/L (ref 0–44)
AST: 12 U/L — ABNORMAL LOW (ref 15–41)
Albumin: 2.2 g/dL — ABNORMAL LOW (ref 3.5–5.0)
Alkaline Phosphatase: 82 U/L (ref 38–126)
Anion gap: 4 — ABNORMAL LOW (ref 5–15)
BUN: 13 mg/dL (ref 6–20)
CO2: 21 mmol/L — ABNORMAL LOW (ref 22–32)
Calcium: 7.9 mg/dL — ABNORMAL LOW (ref 8.9–10.3)
Chloride: 115 mmol/L — ABNORMAL HIGH (ref 98–111)
Creatinine, Ser: 0.71 mg/dL (ref 0.61–1.24)
GFR, Estimated: >60 mL/min (ref >60)
Glucose, Bld: 87 mg/dL (ref 70–99)
Potassium: 3.6 mmol/L (ref 3.5–5.1)
Sodium: 140 mmol/L (ref 135–145)
Total Bilirubin: 0.5 mg/dL (ref 0.3–1.2)
Total Protein: 5.3 g/dL — ABNORMAL LOW (ref 6.5–8.1)

## 2021-08-19 MED ORDER — ENSURE ENLIVE PO LIQD
237.0000 mL | Freq: Two times a day (BID) | ORAL | Status: DC
  Administered 2021-08-19 – 2021-08-22 (×8): 237 mL via ORAL

## 2021-08-19 MED ORDER — HYDROXYZINE HCL 10 MG PO TABS
10.0000 mg | ORAL_TABLET | Freq: Three times a day (TID) | ORAL | Status: DC | PRN
Start: 2021-08-19 — End: 2021-08-21
  Administered 2021-08-19 – 2021-08-21 (×3): 10 mg via ORAL
  Filled 2021-08-19 (×3): qty 1

## 2021-08-19 NOTE — Progress Notes (Signed)
   NAME:  Seth Foster, MRN:  [id number], DOB:  1980/02/07, LOS: 4 ADMISSION DATE:  08/15/2021, CONSULTATION DATE:  08/15/21 REFERRING MD:  Johnney Ou, CHIEF COMPLAINT:  cough, sob  History of Present Illness:   41 yo man with a hx of Crohns disease (required resections) on adalimumap and 6Mercaptopurine, Bipolar, substance abuse d/o , latent TB,  Here with shortness of breath.  Reports he had a pleural effusion and chest tube 1 month ago at a hospital in [place].  Reports she had antibiotics and VATS, d/c with augmentin 08/04/21.   Central and r sided chest pain since discharge but sob started 3 days ago.    +Fever, sweats, green productive cough.   Has been holding adalimumab and taking tramadol for pain.    Hypoalbuminemia AST/ALT slightly elevated  WBC 16.4 Blood cultures pending.   Pertinent  Medical History  Crohns disease Bipolar disease Substance abuse hx  Latent TB  Tobacco use   Depakote Lamictal Mecaptopurine on hold with empyema Folic Acid  G01 5htp serotonin booster Augmentin started 07/28. End date 08/28 Holding adalimumab  Albuterol Robaxin Tramadol Nicotine patch   SH lives in [place] 1ppd x 20 years.    Significant Hospital Events: Including procedures, antibiotic start and stop dates in addition to other pertinent events   8/8 pulm consult rec CVTS given recent VATS 8/9 CVTS consult, rec chest tube + lytics. PCCM placed chest tube, d1 lytics 8/10 d2 lytics  8/11 d3 lytics  Interim History / Subjective:   He is feeling a bit better but asks if we can leave the chest tube in for another day  Afebrile, WBC 10.4   Objective   Blood pressure 123/76, pulse 72, temperature 97.7 F (36.5 C), temperature source Oral, resp. rate 20, height '5\' 11"'$  (1.803 m), weight 62.4 kg, SpO2 96 %.        Intake/Output Summary (Last 24 hours) at 08/19/2021 1104 Last data filed at 08/18/2021 1800 Gross per 24 hour  Intake 100 ml  Output 120 ml  Net  -20 ml   Filed Weights   08/16/21 1343  Weight: 62.4 kg    Examination: General: chronically ill in good spirits  HEENT: NCAT poor dentition  Neuro: AAOx4, grossly nonfocal CV: RRR PULM: R sided chest tube with about 120 out GU:  defer  Extremities: no acute deformity   WBC 10.4  CT Chest with very small residual empyema  Resolved Hospital Problem list     Assessment & Plan:    Recurrent loculated R sided pleural effusion -- S/p VATS / decort (07/2021 at OSH, dc on augmentin) Recent strep intermedius RLL PNA, in immunocompromised host  -now s/p R sided pigtail 8/9 with PCCM with improvement following lytics course P -will tentatively plan to dc chest tube tomorrow per pt request to leave in for 1 more day, keep to suction -20 for now and I've ordered repeat CXR for tomorrow -continue ABX for total 4 week course, would consult ID for consideration of prolonged IV ABX although his history of opioid use disorder may limit his candidacy for OPAT   All other issues per internal medicine inpatient service   Best Practice (right click and "Reselect all SmartList Selections" daily) - per primary   [place]  08/19/2021, 11:04 AM

## 2021-08-19 NOTE — Progress Notes (Signed)
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  Overnight Events: No acute events overnight.  Today he is continuing to show interval improvement and denies any worsening pain or dyspnea. He is coughing more which is causing pain but he notes the PRN robitussin is doing well to decrease this. He has a plan to talk to his outpatient psychiatrist for discussions about xanax and notes he has tried cymbalta and zyprexa in the past with negative experiences on those medications.   Objective:  Vital signs in last 24 hours: Vitals:   08/18/21 2000 08/19/21 0000 08/19/21 0400 08/19/21 0910  BP: 113/73 105/67 (!) 100/59 123/76  Pulse: 64 62 61 72  Resp: '17 18 20 20  '$ Temp: 97.8 F (36.6 C) 97.8 F (36.6 C) 97.7 F (36.5 C) 97.7 F (36.5 C)  TempSrc: Oral Oral Oral Oral  SpO2: 100% 99% 97% 96%  Weight:      Height:       Supplemental O2: Room Air SpO2: 96 %   Physical Exam:  Constitutional: Middle-age male who appears older than stated age sitting up in bed, in no acute distress Cardiovascular: regular rate and rhythm Pulmonary/Chest: Normal work of breathing, mildly decreased lung sounds throughout right lung worse in the base, mildly coarse breath sounds worse in the left lung base MSK: Thin, no lower extremity edema or swollen joints Neurological: alert & oriented x 3 Skin: warm and dry, chest tube in place in right posterior mid scapular line draining into a suction device, does not appear to be any leaks or new or worsening surrounding erythema or edema.  Stable healing  chest tube incisions on the right lateral rib cage  Filed Weights   08/16/21 1343  Weight: 62.4 kg     Intake/Output Summary (Last 24 hours) at 08/19/2021 1237 Last data filed at 08/19/2021 0935 Gross per 24 hour  Intake 520 ml  Output 120 ml  Net 400 ml   Net IO Since Admission: 2,231.92 mL [08/19/21 1237]  Pertinent Labs:    Latest Ref Rng & Units 08/19/2021    3:21 AM 08/18/2021    3:19 AM 08/17/2021    7:30 AM  CBC  WBC 4.0 - 10.5 K/uL 10.4  8.9  10.0   Hemoglobin 13.0 - 17.0 g/dL 8.4  8.9  8.2   Hematocrit 39.0 - 52.0 % 25.3  26.4  25.0   Platelets 150 - 400 K/uL 352  344  355        Latest Ref Rng & Units 08/19/2021    3:21 AM 08/18/2021    3:19 AM 08/17/2021    7:30 AM  CMP  Glucose 70 - 99 mg/dL 87  88  80   BUN 6 - 20 mg/dL '13  10  8   '$ Creatinine 0.61 - 1.24 mg/dL 0.71  0.73  0.74   Sodium 135 - 145 mmol/L 140  138  139   Potassium 3.5 - 5.1 mmol/L 3.6  3.4  3.4   Chloride 98 - 111 mmol/L 115  112  113   CO2 22 - 32 mmol/L 21  20  20   Calcium 8.9 - 10.3 mg/dL 7.9  7.7  7.8   Total Protein 6.5 - 8.1 g/dL 5.3  5.6  5.4   Total Bilirubin 0.3 - 1.2 mg/dL 0.5  0.3  0.4   Alkaline Phos 38 - 126 U/L 82  93  99   AST 15 - 41 U/L 12  27  38   ALT 0 - 44 U/L 38  56  68     Imaging: CT CHEST WO CONTRAST  Result Date: 08/19/2021 CLINICAL DATA:  41 year old male with history of pleural effusion. EXAM: CT CHEST WITHOUT CONTRAST TECHNIQUE: Multidetector CT imaging of the chest was performed following the standard protocol without IV contrast. RADIATION DOSE REDUCTION: This exam was performed according to the departmental dose-optimization program which includes automated exposure control, adjustment of the mA and/or kV according to patient size and/or use of iterative reconstruction technique. COMPARISON:  Chest CT 08/15/2021. FINDINGS: Cardiovascular: Heart size is normal. There is no significant pericardial fluid, thickening or pericardial calcification. There is aortic  atherosclerosis, as well as atherosclerosis of the great vessels of the mediastinum and the coronary arteries, including calcified atherosclerotic plaque in the left main and left anterior descending coronary arteries. Mediastinum/Nodes: No pathologically enlarged mediastinal or hilar lymph nodes. Please note that accurate exclusion of hilar adenopathy is limited on noncontrast CT scans. Esophagus is unremarkable in appearance. No axillary lymphadenopathy. Lungs/Pleura: Compared to the prior examination there has been interval placement of a small bore pigtail drainage catheter with tip reformed in the posterolateral aspect of the lower lateral right pleural space within the previously noted multiloculated pleural fluid collection. There continues to be extensive pleural thickening, as well as a small amount of residual pleural fluid and gas throughout the right pleural space. Widespread but patchy areas of peribronchovascular predominant ground-glass attenuation are noted in the lungs bilaterally, most evident in the left lower lobe, compatible with severe multilobar bilateral bronchopneumonia. Small amount of nodular appearing fluid loculated in the superior aspect of the right major fissure (axial image 74 of series 8), similar to the prior study. No other definite suspicious appearing pulmonary nodules or masses are noted. Upper Abdomen: Unremarkable. Musculoskeletal: There are no aggressive appearing lytic or blastic lesions noted in the visualized portions of the skeleton. IMPRESSION: 1. Multilobar bilateral bronchopneumonia redemonstrated with right-sided empyema, which has decreased in size following placement of drainage catheter, as above. 2. Aortic atherosclerosis, in addition to left main and left anterior descending coronary artery disease. Please note that although the presence of coronary artery calcium documents the presence of coronary artery disease, the severity of this disease and any potential  stenosis cannot be assessed on this non-gated CT examination. Assessment for potential risk factor modification, dietary therapy or pharmacologic therapy may be warranted, if clinically indicated. Aortic Atherosclerosis (ICD10-I70.0). Electronically Signed   By: Vinnie Langton M.D.   On: 08/19/2021 12:29   DG CHEST PORT 1 VIEW  Result Date: 08/18/2021 CLINICAL DATA:  Chest tube in place. EXAM: PORTABLE CHEST 1 VIEW COMPARISON:  Chest radiograph 08/17/2021 FINDINGS: The cardiomediastinal silhouette is unchanged with normal heart size. A right-sided chest tube remains in place. A small right pleural effusion and right basilar airspace opacities are unchanged. Patchy left basilar opacities have improved. No pneumothorax is identified. IMPRESSION: 1. Unchanged small right pleural effusion and right basilar airspace disease. 2. Improved left basilar infiltrates. Electronically Signed   By: Logan Bores M.D.   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     Right sided complex empyema Multifocal pneumonia Recent admission in [place] for right sided empyema treated with chest tube, VATS and antibiotics. He was discharged home with Augmentin with plan to complete on 09/04/2021.  Culture results from [facility] showed strep intermedius that was too fastidious to get sensitivities.  Revealed that he insisted that he leave [facility] early to get back to [facility] which is home.  This may explain the  recurrence of the edema as the culture was too fastidious to get sensitivities on and it is possible the Augmentin did not cover.  Imaging consistent with recurrent empyema and multifocal pneumonia.  PCCM placed chest tube on the right with suction draining. -continue vancomycin and cefepime day 5.  If there is any worsening we will add Flagyl -PCCM and CT surgery consulted.  Continue with pigtail drain with alteplase/dornase alpha per PCCM - CT chest today showed interval decrease in the size of pleural effusion but there is still fluid and gas in the pleural space -trend wbc -blood cultures and pleural fluid culture pending, currently negative -robaxin, tramadol, Tylenol, oxycodone, and Toradol for chest wall pain   Transaminitis  Improving with AST of 12 and ALT of 38.   EKG Changes Prior Ekg from 2021, new findings QRS findings in lead 1/3 and t wave inversions in 3. Possible secondary to severe pulmonary infection.  Echo here showed a moderately enlarged right ventricle without elevation of right atrial pressure and an EF of 50 to 55% with global hypokinesis left ventricle.  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 We do recommend that they be a follow-up echocardiogram after admission.   Crohn's disease Recently seen by his gastroenterologist. Will continue to hold adalimumab and 17m in setting of empyema.    Bipolar disorder Continue Depakote and Lamictal. Xanax 0.5 mg BID prn added by PCCM. He will plan to follow up outpatient for permanent changes to meds.  We will consider Klonopin on discharge if he is requiring enough as needed Xanax to make is worried about withdrawals. -Hydroxyzine 10 mg 3 times daily as needed   Opioid use disorder Currently working with PCP and psychiatrist to find medical therapies. Trialed on suboxone and did not find useful, felt xanax helped  more. Driving factor is increased anxiety.  -continue to follow up with PCP -Tramadol, Robaxin, toradol, Oxy for pain   Hx of positive quantiferon-TB Negative follow up x2, consistent with latent TB.    Tobacco use disorder Nicotine patch daily   Diet: Normal IVF: None,None VTE: Enoxaparin Code: Full     Dispo: Anticipated discharge pending further evaluation and treatment.  JA[facility]fordInternal Medicine Resident PGY-1 Pager: [phone] Please contact the on call pager after 5 pm and on weekends at [phone]

## 2021-08-19 NOTE — Plan of Care (Signed)

## 2021-08-20 ENCOUNTER — Inpatient Hospital Stay (HOSPITAL_COMMUNITY): Payer: Medicare Other

## 2021-08-20 ENCOUNTER — Telehealth: Payer: Self-pay | Admitting: Pulmonary Disease

## 2021-08-20 DIAGNOSIS — J869 Pyothorax without fistula: Secondary | ICD-10-CM | POA: Diagnosis not present

## 2021-08-20 DIAGNOSIS — Z716 Tobacco abuse counseling: Secondary | ICD-10-CM

## 2021-08-20 LAB — BODY FLUID CULTURE W GRAM STAIN
Culture: NO GROWTH
Gram Stain: NONE SEEN

## 2021-08-20 LAB — CULTURE, BLOOD (ROUTINE X 2)
Culture: NO GROWTH
Culture: NO GROWTH
Special Requests: ADEQUATE

## 2021-08-20 LAB — CBC WITH DIFFERENTIAL/PLATELET
Abs Immature Granulocytes: 0.03 10*3/uL (ref 0.00–0.07)
Basophils Absolute: 0.1 10*3/uL (ref 0.0–0.1)
Basophils Relative: 1 %
Eosinophils Absolute: 0.4 10*3/uL (ref 0.0–0.5)
Eosinophils Relative: 5 %
HCT: 26.3 % — ABNORMAL LOW (ref 39.0–52.0)
Hemoglobin: 8.5 g/dL — ABNORMAL LOW (ref 13.0–17.0)
Immature Granulocytes: 0 %
Lymphocytes Relative: 20 %
Lymphs Abs: 1.7 10*3/uL (ref 0.7–4.0)
MCH: 30.9 pg (ref 26.0–34.0)
MCHC: 32.3 g/dL (ref 30.0–36.0)
MCV: 95.6 fL (ref 80.0–100.0)
Monocytes Absolute: 0.9 10*3/uL (ref 0.1–1.0)
Monocytes Relative: 10 %
Neutro Abs: 5.2 10*3/uL (ref 1.7–7.7)
Neutrophils Relative %: 64 %
Platelets: 358 10*3/uL (ref 150–400)
RBC: 2.75 MIL/uL — ABNORMAL LOW (ref 4.22–5.81)
RDW: 17.4 % — ABNORMAL HIGH (ref 11.5–15.5)
WBC: 8.2 10*3/uL (ref 4.0–10.5)
nRBC: 0 % (ref 0.0–0.2)

## 2021-08-20 LAB — VANCOMYCIN, PEAK
Vancomycin Pk: 16 ug/mL — ABNORMAL LOW (ref 30–40)
Vancomycin Pk: 4 ug/mL — ABNORMAL LOW (ref 30–40)

## 2021-08-20 LAB — BASIC METABOLIC PANEL
Anion gap: 8 (ref 5–15)
BUN: 9 mg/dL (ref 6–20)
CO2: 19 mmol/L — ABNORMAL LOW (ref 22–32)
Calcium: 8.1 mg/dL — ABNORMAL LOW (ref 8.9–10.3)
Chloride: 111 mmol/L (ref 98–111)
Creatinine, Ser: 0.73 mg/dL (ref 0.61–1.24)
GFR, Estimated: >60 mL/min (ref >60)
Glucose, Bld: 85 mg/dL (ref 70–99)
Potassium: 3.8 mmol/L (ref 3.5–5.1)
Sodium: 138 mmol/L (ref 135–145)

## 2021-08-20 LAB — MRSA NEXT GEN BY PCR, NASAL: MRSA by PCR Next Gen: NOT DETECTED

## 2021-08-20 MED ORDER — CALCIUM CARBONATE ANTACID 500 MG PO CHEW
1.0000 | CHEWABLE_TABLET | Freq: Three times a day (TID) | ORAL | Status: DC | PRN
Start: 2021-08-20 — End: 2021-08-22
  Administered 2021-08-20 – 2021-08-22 (×5): 200 mg via ORAL
  Filled 2021-08-20 (×5): qty 1

## 2021-08-20 MED ORDER — ALPRAZOLAM 0.5 MG PO TABS
0.5000 mg | ORAL_TABLET | Freq: Every evening | ORAL | Status: DC | PRN
  Administered 2021-08-20 – 2021-08-22 (×2): 0.5 mg via ORAL
  Filled 2021-08-20 (×2): qty 1

## 2021-08-20 MED ORDER — RIVAROXABAN 10 MG PO TABS
10.0000 mg | ORAL_TABLET | Freq: Every day | ORAL | Status: DC
  Administered 2021-08-20 – 2021-08-22 (×3): 10 mg via ORAL
  Filled 2021-08-20 (×3): qty 1

## 2021-08-20 MED ORDER — ALPRAZOLAM 0.25 MG PO TABS
0.2500 mg | ORAL_TABLET | Freq: Every day | ORAL | Status: DC | PRN
  Administered 2021-08-20 – 2021-08-21 (×2): 0.25 mg via ORAL
  Filled 2021-08-20 (×2): qty 1

## 2021-08-20 MED ORDER — DICLOFENAC SODIUM 1 % EX GEL
4.0000 g | Freq: Four times a day (QID) | CUTANEOUS | Status: DC
  Administered 2021-08-20 – 2021-08-22 (×8): 4 g via TOPICAL
  Filled 2021-08-20: qty 100

## 2021-08-20 MED ORDER — VANCOMYCIN HCL IN DEXTROSE 1-5 GM/200ML-% IV SOLN
1000.0000 mg | Freq: Two times a day (BID) | INTRAVENOUS | Status: DC
  Administered 2021-08-20 – 2021-08-22 (×5): 1000 mg via INTRAVENOUS
  Filled 2021-08-20 (×8): qty 200

## 2021-08-20 NOTE — Telephone Encounter (Signed)
PCCM:  Needs hospital follow up appt in [place] clinic ~6 weeks   [facility] 08/20/2021 3:00 PM

## 2021-08-20 NOTE — Progress Notes (Signed)
Pharmacy Antibiotic Note  Seth Foster is a 41 y.o. male for which pharmacy has been consulted for cefepime dosing for sepsis.  Patient with a history of crohn's disease s/p resection, recent hospitalization for empyema requiring chest tube and VATS. Patient presenting with SOB.  8/13 AM update:  Sub-therapeutic AUC of 247 Renal functio remains stable  Plan: Inc vancomycin to 1000 mg IV q12h >>New estimated AUC: 494 Cont cefepime as ordered F/U vancomycin levels PRN  Height: '5\' 11"'$  (180.3 cm) Weight: 62.4 kg (137 lb 9.1 oz) IBW/kg (Calculated) : 75.3  Temp (24hrs), Avg:98.1 F (36.7 C), Min:97.7 F (36.5 C), Max:98.7 F (37.1 C)  Recent Labs  Lab 08/15/21 1739 08/15/21 2034 08/16/21 0111 08/17/21 0730 08/18/21 0319 08/19/21 0321 08/20/21 0003 08/20/21 0203  WBC  --   --  11.2* 10.0 8.9 10.4 8.2  --   CREATININE  --   --  0.64 0.74 0.73 0.71 0.73  --   LATICACIDVEN 2.0* 1.9  --   --   --   --   --   --   VANCOPEAK  --   --   --   --   --   --  4* 16*     Estimated Creatinine Clearance: 109.4 mL/min (by C-G formula based on SCr of 0.73 mg/dL).    Allergies  Allergen Reactions   Benadryl [Diphenhydramine Hcl] Anaphylaxis   Sulfa Antibiotics Hives   Broccoli [Brassica Oleracea] Other (See Comments)    Causes terrible gas.    Scallops [Shellfish Allergy] Diarrhea and Nausea And Vomiting    Narda Bonds, PharmD, BCPS Clinical Pharmacist Phone: [phone]

## 2021-08-20 NOTE — Progress Notes (Signed)
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  Overnight Events: No acute events overnight  Patient is doing alright this morning and continuing to show improvement.  He denies any new or worsening complaints and is not having any increased pain or dyspnea.  He does note that when his pain regimen is spaced out too far he does have breakthrough pain.   Objective:  Vital signs in last 24 hours: Vitals:   08/19/21 2310 08/20/21 0340 08/20/21 0745 08/20/21 0812  BP: 113/71 112/60  (!) 102/56  Pulse: 62 64 (!) 54 60  Resp: '20 17 19 19  '$ Temp: 98.2 F (36.8 C) 98.7 F (37.1 C)  97.7 F (36.5 C)  TempSrc: Oral Oral  Oral  SpO2: 97% 98% 98% 97%  Weight:      Height:       Supplemental O2: Room Air SpO2: 97 %   Physical Exam:  Constitutional: Middle-age male who appears older than stated age sitting up in bed, in no acute distress Cardiovascular: regular rate and rhythm Pulmonary/Chest: Normal work of breathing, mildly decreased lung sounds throughout right lung worse in the base, mildly coarse breath sounds worse in the left lung base MSK: Thin, no lower extremity edema or swollen joints Neurological: alert & oriented x 3 Skin: warm and dry, chest tube in place in right posterior mid scapular line draining into a suction device, does not appear to be any leaks or new or worsening surrounding erythema or edema.  Stable healing chest tube incisions on the right lateral rib cage  Filed Weights   08/16/21 1343  Weight: 62.4 kg     Intake/Output Summary (Last 24 hours)  at 08/20/2021 1206 Last data filed at 08/20/2021 1005 Gross per 24 hour  Intake 492.54 ml  Output 810 ml  Net -317.46 ml   Net IO Since Admission: 2,554.46 mL [08/20/21 1206]  Pertinent Labs:    Latest Ref Rng & Units 08/20/2021   12:03 AM 08/19/2021    3:21 AM 08/18/2021    3:19 AM  CBC  WBC 4.0 - 10.5 K/uL 8.2  10.4  8.9   Hemoglobin 13.0 - 17.0 g/dL 8.5  8.4  8.9   Hematocrit 39.0 - 52.0 % 26.3  25.3  26.4   Platelets 150 - 400 K/uL 358  352  344        Latest Ref Rng & Units 08/20/2021   12:03 AM 08/19/2021    3:21 AM 08/18/2021    3:19 AM  CMP  Glucose 70 - 99 mg/dL 85  87  88   BUN 6 - 20 mg/dL '9  13  10   '$ Creatinine 0.61 - 1.24 mg/dL 0.73  0.71  0.73   Sodium 135 - 145 mmol/L 138  140  138   Potassium 3.5 - 5.1 mmol/L 3.8  3.6  3.4   Chloride 98 - 111 mmol/L 111  115  112   CO2 22 - 32 mmol/L '19  21  20   '$ Calcium 8.9 - 10.3 mg/dL 8.1  7.9  7.7   Total Protein 6.5 - 8.1  g/dL  5.3  5.6   Total Bilirubin 0.3 - 1.2 mg/dL  0.5  0.3   Alkaline Phos 38 - 126 U/L  82  93   AST 15 - 41 U/L  12  27   ALT 0 - 44 U/L  38  56     Imaging: DG CHEST PORT 1 VIEW  Result Date: 08/20/2021 CLINICAL DATA:  Empyema EXAM: PORTABLE CHEST 1 VIEW COMPARISON:  Prior chest x-ray 08/18/2021 FINDINGS: Pigtail thoracostomy tube projects over the right lung base. Persistent pleural thickening and chronic atelectasis. Cardiac and mediastinal contours remain unchanged. Stable patchy airspace opacities throughout the left lower lobe. IMPRESSION: Stable chest x-ray without significant interval change. Unchanged position of right basilar chest tube. Electronically Signed   By: Jacqulynn Cadet M.D.   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     Right sided complex empyema Multifocal pneumonia Recent admission in [place] for right sided empyema treated with chest tube, VATS and antibiotics. He was discharged home with Augmentin with plan to complete on 09/04/2021.  Culture results from [facility] showed strep intermedius.  Further chart review showed susceptibilities with sensitivity to ampicillin, ceftriaxone, cefotaxime, penicillin G, vancomycin. Imaging consistent with recurrent empyema and multifocal pneumonia.  PCCM placed chest tube on the right with suction draining and did 3 days of lytic therapies.  Blood and pleural fluid cultures remain negative here. Consulted infectious disease who found the susceptibilities and recommend Augmentin as an oral option upon discharge as the main issue for readmission was likely an adequate source control.  Recommended 4 weeks of antibiotic therapy.  We will continue with IV antibiotics for today and reevaluate tomorrow for discussions of transition to oral antibiotics. -continue vancomycin and cefepime day 6 -PCCM and CT surgery consulted.  Chest tube removed today -blood cultures and pleural fluid culture pending, currently negative -robaxin, tramadol, Tylenol, oxycodone, and Toradol for chest wall pain -We will attempt to add/transition pain medication to topical after chest tube removal.   EKG Changes Prior Ekg from 2021, new findings QRS findings in lead 1/3 and t wave inversions in 3. Possible secondary to severe pulmonary infection.  Echo here showed a moderately enlarged right ventricle without elevation of right atrial pressure and an EF of 50 to 55% with global hypokinesis left ventricle.  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 We do recommend that they be a follow-up echocardiogram after admission.   Crohn's disease Recently seen by his gastroenterologist. Will continue to hold adalimumab and 62m in setting of empyema.    Bipolar disorder Continue Depakote and Lamictal. Xanax 0.5 mg BID prn added by PCCM. He will plan to follow up outpatient for permanent changes to meds.  We will consider Klonopin on discharge if he is requiring enough as needed Xanax to make is worried about withdrawals. -Hydroxyzine 10 mg 3 times daily as needed -  Xanax prescription ready formatted but still as needed for a total dose of 1 mg daily   Opioid use disorder Currently working with PCP and psychiatrist to find medical therapies. Trialed on suboxone and did not find useful, felt xanax helped more. Driving factor is increased anxiety.  -continue to follow up with PCP -Tramadol, Robaxin, toradol, Oxy for pain.  We will try to transition to topical as above.   Hx of positive quantiferon-TB Negative follow up x2, consistent with latent TB.    Tobacco use disorder Nicotine patch daily  Transaminitis  Resolved   Diet: Normal VTE: Xarelto Code: Full     Dispo: Anticipated discharge pending further evaluation and treatment.  [place] Internal Medicine Resident PGY-1 Pager: [phone]  Please contact the on call pager after 5 pm and on weekends at [phone].

## 2021-08-20 NOTE — Progress Notes (Signed)
Patient's right sided pigtail chest tube removed without complication. Patient tolerated the removal of the chest tube well. Lung sounds auscultated bilaterally post removal. Chest xray order placed as per protocol.

## 2021-08-20 NOTE — Progress Notes (Addendum)
   NAME:  Seth Foster, MRN:  [id number], DOB:  Feb 24, 1980, LOS: 5 ADMISSION DATE:  08/15/2021, CONSULTATION DATE:  08/15/21 REFERRING MD:  Johnney Ou, CHIEF COMPLAINT:  cough, sob  History of Present Illness:   41 yo man with a hx of Crohns disease (required resections) on adalimumap and 6Mercaptopurine, Bipolar, substance abuse d/o , latent TB,  Here with shortness of breath.  Reports he had a pleural effusion and chest tube 1 month ago at a hospital in [place].  Reports she had antibiotics and VATS, d/c with augmentin 08/04/21.   Central and r sided chest pain since discharge but sob started 3 days ago.    +Fever, sweats, green productive cough.   Has been holding adalimumab and taking tramadol for pain.    Hypoalbuminemia AST/ALT slightly elevated  WBC 16.4 Blood cultures pending.   Pertinent  Medical History  Crohns disease Bipolar disease Substance abuse hx  Latent TB  Tobacco use   Depakote Lamictal Mecaptopurine on hold with empyema Folic Acid  W29 5htp serotonin booster Augmentin started 07/28. End date 08/28 Holding adalimumab  Albuterol Robaxin Tramadol Nicotine patch  SH lives in [place] 1ppd x 20 years.    Significant Hospital Events: Including procedures, antibiotic start and stop dates in addition to other pertinent events   8/8 pulm consult rec CVTS given recent VATS 8/9 CVTS consult, rec chest tube + lytics. PCCM placed chest tube, d1 lytics 8/10 d2 lytics  8/11 d3 lytics  Interim History / Subjective:   No issues overnight night. Minimal drain output   Objective   Blood pressure 115/69, pulse 78, temperature 97.6 F (36.4 C), temperature source Oral, resp. rate 17, height '5\' 11"'$  (1.803 m), weight 62.4 kg, SpO2 99 %.        Intake/Output Summary (Last 24 hours) at 08/20/2021 1341 Last data filed at 08/20/2021 1005 Gross per 24 hour  Intake 492.54 ml  Output 810 ml  Net -317.46 ml   Filed Weights   08/16/21 1343  Weight: 62.4  kg    Examination: General: chronically ill appearing  HEENT: NCAT tracking appropriately  Neuro: AAOX3  CV: RRR, s1 s2  PULM: diminished in right chest  GU: deferred  Extremities: no edema   CT Chest: pleural space improved   Resolved Hospital Problem list     Assessment & Plan:    Recurrent loculated R sided pleural effusion -- S/p VATS / decort (07/2021 at OSH, dc on augmentin) Recent strep intermedius RLL PNA, in immunocompromised host  -now s/p R sided pigtail 8/9 with PCCM with improvement following lytics course P Remove chest tube  Would discharge with 3 weeks of augmentin  Needs repeat ct chest in 6 weeks  Can follow up in ID clinic or pulm clinic, he lives in [place] so may be more convenient for him to follow up in pulm clinic there  We will sign off Call if needed   Best Practice (right click and "Reselect all SmartList Selections" daily) - per primary    Garner Nash, DO [facility] 08/20/2021 1:41 PM

## 2021-08-21 ENCOUNTER — Inpatient Hospital Stay (HOSPITAL_COMMUNITY): Payer: Medicare Other

## 2021-08-21 DIAGNOSIS — F17210 Nicotine dependence, cigarettes, uncomplicated: Secondary | ICD-10-CM

## 2021-08-21 DIAGNOSIS — F3181 Bipolar II disorder: Secondary | ICD-10-CM

## 2021-08-21 DIAGNOSIS — F419 Anxiety disorder, unspecified: Secondary | ICD-10-CM

## 2021-08-21 DIAGNOSIS — R9431 Abnormal electrocardiogram [ECG] [EKG]: Secondary | ICD-10-CM

## 2021-08-21 DIAGNOSIS — K50918 Crohn's disease, unspecified, with other complication: Secondary | ICD-10-CM

## 2021-08-21 LAB — BASIC METABOLIC PANEL
Anion gap: 7 (ref 5–15)
BUN: 10 mg/dL (ref 6–20)
CO2: 20 mmol/L — ABNORMAL LOW (ref 22–32)
Calcium: 8.1 mg/dL — ABNORMAL LOW (ref 8.9–10.3)
Chloride: 111 mmol/L (ref 98–111)
Creatinine, Ser: 0.75 mg/dL (ref 0.61–1.24)
GFR, Estimated: >60 mL/min (ref >60)
Glucose, Bld: 100 mg/dL — ABNORMAL HIGH (ref 70–99)
Potassium: 3.9 mmol/L (ref 3.5–5.1)
Sodium: 138 mmol/L (ref 135–145)

## 2021-08-21 LAB — CBC
HCT: 25.8 % — ABNORMAL LOW (ref 39.0–52.0)
Hemoglobin: 8.5 g/dL — ABNORMAL LOW (ref 13.0–17.0)
MCH: 30.9 pg (ref 26.0–34.0)
MCHC: 32.9 g/dL (ref 30.0–36.0)
MCV: 93.8 fL (ref 80.0–100.0)
Platelets: 402 10*3/uL — ABNORMAL HIGH (ref 150–400)
RBC: 2.75 MIL/uL — ABNORMAL LOW (ref 4.22–5.81)
RDW: 17.5 % — ABNORMAL HIGH (ref 11.5–15.5)
WBC: 9.5 10*3/uL (ref 4.0–10.5)
nRBC: 0 % (ref 0.0–0.2)

## 2021-08-21 MED ORDER — RAMELTEON 8 MG PO TABS
8.0000 mg | ORAL_TABLET | Freq: Every day | ORAL | Status: DC
  Administered 2021-08-21: 8 mg via ORAL
  Filled 2021-08-21 (×2): qty 1

## 2021-08-21 MED ORDER — ACETAMINOPHEN 500 MG PO TABS
1000.0000 mg | ORAL_TABLET | Freq: Four times a day (QID) | ORAL | Status: DC
  Administered 2021-08-22 (×3): 1000 mg via ORAL
  Filled 2021-08-21 (×4): qty 2

## 2021-08-21 MED ORDER — HYDROXYZINE HCL 25 MG PO TABS
25.0000 mg | ORAL_TABLET | Freq: Three times a day (TID) | ORAL | Status: DC | PRN
  Administered 2021-08-22: 25 mg via ORAL
  Filled 2021-08-21: qty 1

## 2021-08-21 NOTE — Progress Notes (Signed)
Internal Medicine Attending:   I saw and examined the patient. I reviewed the resident's note and I agree with the resident's findings and plan as documented in the resident's note.  Continue to maximize nonopioid pain treatment as much as possible.  Hopefully we can wean him off the oxycodone given a history of opioid use disorder.  Otherwise I agree then we likely can de-escalate antibiotics down to Augmentin in preparation for discharge.  I also would like to wean down Xanax as this is not an ideal treatment of an anxiety disorder.

## 2021-08-21 NOTE — Progress Notes (Signed)
Patient about sudden onset dyspnea with RR 36-42 on monitor.  Went to assess patient.  He was tachypneic with increased work of breathing, worsening cough.  He endorsed severe, sharp pleuritic pain at the site where his chest tube was yesterday.  No chest pain or nausea or vomiting  On exam breath sounds audible in upper and middle lung fields,  absent in lower lung fields.  He is tachypneic with increased work of breathing on room air.  Blood pressure stable 133/81.  Obtained stat CXR which shows stable imaging findings compared to post chest tube removal chest x-ray yesterday.  No pneumothorax reported.  Patient received PRN pain medications and was subsequently stable/sleeping.  Will CTM and can consider increasing pain regimen as needed.

## 2021-08-21 NOTE — Progress Notes (Signed)
Patient c/o of increased SOB. RR were 36-42 on monitor. O2 remained betweeen 97-100 on RA. Upon assessment breath sounds on right side were very diminished and hard to hear. RN message MD. Respiratory and MD came to patient bedside to assess. CXR was ordered. Patient was given PRN pain med to help with comfort. CXR results pending. RN will continue to monitor and update as needed.

## 2021-08-21 NOTE — Progress Notes (Signed)
HD#6 Subjective:   Summary: This is a 41 year old male with a history of Crohn's disease on adalimumab and 6 metacaptopurine, bipolar disorder type II, substance abuse disorder, and tobacco use with recent treatment of an empyema with VATS on the right lung presenting with worsening pleuritic chest pain and productive cough.  Patient was admitted for an empyema of his right lung and started on vancomycin and cefepime.  Overnight events: Patient was tachypneic and had a worsening cough. He endorsed sharp pleuritic chest pain, where his chest tube was removed.  He denies any nausea or vomiting. A stat chest Xray was taken and it showed stable findings compared to the Xray taken after the removal of the chest tube. Patient's episode resolved with pain medication.   Patient reports that he is doing well this morning. He states that he is not having shortness of breath, and occasions has sharp pains at the site of this chest tube removal. He voices good understanding of his pain control regimen and reports that it is helping. He is understanding about tapering off the opioids and relates to me that the muscle relaxer he is taking is very helpful.   Objective:  Vital signs in last 24 hours: Vitals:   08/21/21 0800 08/21/21 1152 08/21/21 1156 08/21/21 1600  BP: 110/63 104/63    Pulse: 67 64    Resp: (!) 22 16    Temp: 97.9 F (36.6 C)  98 F (36.7 C) 98.1 F (36.7 C)  TempSrc: Tympanic  Oral Oral  SpO2: 100% 99%    Weight:      Height:       Supplemental O2: Room Air SpO2: 99 %   Physical Exam:  General: Patient is resting comfortably in bed in no acute distress  Eyes: EOM intact  Head: Normocephalic, atraumatic  Cardio: Regular rate and rhythm, no murmurs, rubs or gallops. 2+ pulses to bilateral upper Chest: Right lower chest tenderness on palpation at site around the chest tube hole. Would is healing well. No signs of infection or drainage.  Pulmonary: decreased breath sounds to  right lower lobe. breath sounds throughout bilaterally. Clear lungs sounds.  Neuro: Alert and orientated x3.  Sensation intact to upper and lower extremities  MSK: 5/5 strength to upper and lower extremities.   Filed Weights   08/16/21 1343  Weight: 62.4 kg    No intake or output data in the 24 hours ending 08/21/21 1613  Net IO Since Admission: 2,554.46 mL [08/21/21 1613]  Pertinent Labs:    Latest Ref Rng & Units 08/21/2021    7:07 AM 08/20/2021   12:03 AM 08/19/2021    3:21 AM  CBC  WBC 4.0 - 10.5 K/uL 9.5  8.2  10.4   Hemoglobin 13.0 - 17.0 g/dL 8.5  8.5  8.4   Hematocrit 39.0 - 52.0 % 25.8  26.3  25.3   Platelets 150 - 400 K/uL 402  358  352        Latest Ref Rng & Units 08/21/2021    7:07 AM 08/20/2021   12:03 AM 08/19/2021    3:21 AM  CMP  Glucose 70 - 99 mg/dL 100  85  87   BUN 6 - 20 mg/dL '10  9  13   '$ Creatinine 0.61 - 1.24 mg/dL 0.75  0.73  0.71   Sodium 135 - 145 mmol/L 138  138  140   Potassium 3.5 - 5.1 mmol/L 3.9  3.8  3.6   Chloride 98 -  111 mmol/L 111  111  115   CO2 22 - 32 mmol/L '20  19  21   '$ Calcium 8.9 - 10.3 mg/dL 8.1  8.1  7.9   Total Protein 6.5 - 8.1 g/dL   5.3   Total Bilirubin 0.3 - 1.2 mg/dL   0.5   Alkaline Phos 38 - 126 U/L   82   AST 15 - 41 U/L   12   ALT 0 - 44 U/L   38     Imaging: DG CHEST PORT 1 VIEW  Result Date: 08/21/2021 CLINICAL DATA:  Chest pain and dyspnea. EXAM: PORTABLE CHEST 1 VIEW COMPARISON:  August 20, 2021 (3:27 p.m.) FINDINGS: The heart size and mediastinal contours are within normal limits. Stable mild to moderate severity right basilar atelectasis and/or infiltrate is seen. The left lung is clear. There is a small, stable right pleural effusion. No pneumothorax is identified. The visualized skeletal structures are unremarkable. IMPRESSION: 1. Stable mild to moderate severity right basilar atelectasis and/or infiltrate. 2. Small, stable right pleural effusion. Electronically Signed   By: Virgina Norfolk M.D.   On:  08/21/2021 02:54    Assessment/Plan:   Principal Problem:   Empyema of right pleural space (HCC) Active Problems:   Crohn's disease of intestine ([place])   Opioid dependence ([place])   Patient Summary: This is a 41 year old male with a past medical history of Crohn disease and on adalimumab and 6 mercaptopurine, bipolar disorder type II, substance use disorder, tobacco use disorder, and recent treatment of empyema on right lung with VATS presented to the hospital with worsening pleuritic chest pain and productive cough.  Patient was admitted for right lung empyema and started on vancomycin and cefepime.   Right sided complex empyema Multifocal pneumonia Patient admitted for treatment of right-sided empyema.  Patient had recent admission with this same empyema in [place] and was treated with a chest tube, VATS, and antibiotics.  He was then discharged home on Augmentin to complete the treatment..  Culture results there showed strep intermedius that showed susceptibility with sensitivity to ampicillin, ceftriaxone, cefotaxime, penicillin G, and vancomycin.  Patient was not on a treatment that showed susceptibility, and therefore may have worsened over time.  Patient presented with worsening productive cough and pleuritic chest pain.  On imaging, there was a recurrent empyema with multifocal pneumonia.  Patient had a chest tube placed with suction draining and 3 days of lytic therapy.  Pleural fluid and blood cultures continue to be negative.  ID consulted who recommended Augmentin upon discharge.  This is day 7 of vancomycin and cefepime.  Plan would be to switch to Augmentin upon discharge which is likely tomorrow.  Patient is responding well to treatment, and clinical symptoms are resolving.  Current pain control regimen includes Voltaren gel as needed, Toradol 15 mg every 6 hours, lidocaine patch every 12 hours, Robaxin 1000 mg 4 times daily, oxycodone 5 to 10 mg every 6 hours as needed, tramadol 50 mg  every 6 as needed.  Patient reports pain is well controlled.  Plan: - Day 7 vancomycin and cefepime - Plan for discharge tomorrow, and patient will likely be started on Augmentin - ID following - Continue pain control regimen withVoltaren gel as needed, Toradol 15 mg every 6 hours, lidocaine patch every 12 hours, Robaxin 1000 mg 4 times daily, oxycodone 5 to 10 mg every 6 hours as needed, tramadol 50 mg every 6 as needed.  Patient reports pain is well controlled.  Upon discharge,  patient will have modified pain control regimen   EKG Changes During admission, patient did have new EKG findings with new findings of QRS findings in lead 1-3 and T wave inversions in 3.  This could be secondary to severe infection.  Patient did have an echo showing moderately enlarged left ventricle with elevation of right atrial pressure and an EF of 50 to 55% with global hypokinesis of left ventricle.  This is decreased from his echo at [facility] showing EF of 60 to 65% without hypokinesis.  Plan will be for patient to be evaluated outpatient for echo once patient is out of his acute infection phase.  Patient has no concerns of lower extremity edema or chest pains currently.  Plan: - Echo showing EF 50 to 55% with global hypokinesis of left ventricle.  This is changed from previous of 60 to 65%.  Likely from severe infection - Follow-up outpatient echocardiogram    Crohn's disease Given his current infection, patient to continue to hold adalimumab and 6-MP  Plan: -Hold adalimumab and 6-MP. Follow up with GI after discharge   Bipolar disorder Anxiety Patient reports that he has been having some anxiety, and Xanax 0.5 mg twice daily as needed was started by PCCM.  Patient reports good anxiety control.  Hydroxyzine is preferred to Xanax given patient's history, so keeping hydroxyzine would be a better idea.  Patient is still on Depakote and Lamictal.  Patient has no concerns at this point.  Plan: - Continue  hydroxyzine 10 mg 3 times daily as needed - Xanax 0.'25mg'$  in AM and .'5mg'$  at bedtime  - Continue home meds.   Opioid use disorder  Patient still trying to work on opioid use disorder.  Patient states that Suboxone did not help him.  Patient does not have current active withdrawals.  Patient reports she will follow this up outpatient.  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    Tobacco use disorder  Plan: - Nicotine patch daily  Transaminitis, resolved  Plan: - Continue to monitor liver enzymes   Diet: Normal VTE: Xarelto Code: Full     Dispo: Anticipated discharge either later today, or tomorrow depending on good pain control.  [facility] Resident PGY-1 Pager: [phone] Please contact the on call pager after 5 pm and on weekends at [phone].

## 2021-08-21 NOTE — Care Management Important Message (Signed)
Important Message  Patient Details  Name: Seth Foster MRN: [id number] Date of Birth: 01-30-1980   Medicare Important Message Given:  Yes     Leialoha Hanna 08/21/2021, 3:00 PM

## 2021-08-22 ENCOUNTER — Other Ambulatory Visit (HOSPITAL_COMMUNITY): Payer: Self-pay

## 2021-08-22 ENCOUNTER — Telehealth: Payer: Self-pay | Admitting: Pulmonary Disease

## 2021-08-22 LAB — CBC WITH DIFFERENTIAL/PLATELET
Abs Immature Granulocytes: 0.04 10*3/uL (ref 0.00–0.07)
Basophils Absolute: 0.1 10*3/uL (ref 0.0–0.1)
Basophils Relative: 1 %
Eosinophils Absolute: 0.4 10*3/uL (ref 0.0–0.5)
Eosinophils Relative: 4 %
HCT: 25.9 % — ABNORMAL LOW (ref 39.0–52.0)
Hemoglobin: 8.6 g/dL — ABNORMAL LOW (ref 13.0–17.0)
Immature Granulocytes: 0 %
Lymphocytes Relative: 20 %
Lymphs Abs: 1.9 10*3/uL (ref 0.7–4.0)
MCH: 30.5 pg (ref 26.0–34.0)
MCHC: 33.2 g/dL (ref 30.0–36.0)
MCV: 91.8 fL (ref 80.0–100.0)
Monocytes Absolute: 0.9 10*3/uL (ref 0.1–1.0)
Monocytes Relative: 10 %
Neutro Abs: 6.3 10*3/uL (ref 1.7–7.7)
Neutrophils Relative %: 65 %
Platelets: 423 10*3/uL — ABNORMAL HIGH (ref 150–400)
RBC: 2.82 MIL/uL — ABNORMAL LOW (ref 4.22–5.81)
RDW: 17.2 % — ABNORMAL HIGH (ref 11.5–15.5)
WBC: 9.6 10*3/uL (ref 4.0–10.5)
nRBC: 0 % (ref 0.0–0.2)

## 2021-08-22 MED ORDER — OXYCODONE HCL 5 MG PO TABS
10.0000 mg | ORAL_TABLET | Freq: Four times a day (QID) | ORAL | 0 refills | Status: AC | PRN
  Filled 2021-08-22: qty 56, 7d supply, fill #0

## 2021-08-22 MED ORDER — AMOXICILLIN-POT CLAVULANATE 875-125 MG PO TABS
1.0000 | ORAL_TABLET | Freq: Two times a day (BID) | ORAL | 0 refills | Status: AC
Start: 2021-08-22 — End: 2021-09-12
  Filled 2021-08-22: qty 42, 21d supply, fill #0

## 2021-08-22 MED ORDER — NAPROXEN 375 MG PO TABS
375.0000 mg | ORAL_TABLET | Freq: Two times a day (BID) | ORAL | 0 refills | Status: AC
  Filled 2021-08-22: qty 28, 14d supply, fill #0

## 2021-08-22 MED ORDER — METHOCARBAMOL 500 MG PO TABS
1000.0000 mg | ORAL_TABLET | Freq: Four times a day (QID) | ORAL | 0 refills | Status: AC | PRN
  Filled 2021-08-22: qty 60, 8d supply, fill #0

## 2021-08-22 NOTE — Discharge Summary (Signed)
Name: Seth Foster MRN: [id number] DOB: [birth year] 41 y.o. PCP: Gustavus Bryant, PA-C  Date of Admission: 08/15/2021  8:48 AM Date of Discharge: No discharge date for patient encounter. Attending Physician: Lucious Groves, DO  Discharge Diagnosis: Empyema of the right pleural space Crohn's disease of intestine Normocytic anemia Opioid use disorder Type II bipolar Tobacco use disorder   Discharge Medications: Allergies as of 08/22/2021       Reactions   Benadryl [diphenhydramine Hcl] Anaphylaxis   Sulfa Antibiotics Hives   Broccoli [brassica Oleracea] Other (See Comments)   Causes terrible gas.    Scallops [shellfish Allergy] Diarrhea, Nausea And Vomiting        Medication List     STOP taking these medications    doxycycline 100 MG capsule Commonly known as: VIBRAMYCIN   HYDROCORTISONE ACE (RECTAL) 30 MG Supp   hydrocortisone-pramoxine rectal foam Commonly known as: PROCTOFOAM-HC   hydrOXYzine 25 MG tablet Commonly known as: ATARAX   mercaptopurine 50 MG tablet Commonly known as: PURINETHOL   PARoxetine 20 MG tablet Commonly known as: PAXIL   temazepam 15 MG capsule Commonly known as: RESTORIL   tiZANidine 4 MG tablet Commonly known as: ZANAFLEX   traMADol 50 MG tablet Commonly known as: ULTRAM       TAKE these medications    acetaminophen 500 MG tablet Commonly known as: TYLENOL Take 1,000 mg by mouth every 4 (four) hours as needed for moderate pain.   albuterol 108 (90 Base) MCG/ACT inhaler Commonly known as: VENTOLIN HFA Inhale 2 puffs into the lungs every 6 (six) hours as needed for wheezing or shortness of breath.   amoxicillin-clavulanate 875-125 MG tablet Commonly known as: AUGMENTIN Take 1 tablet by mouth 2 (two) times daily for 21 days.   divalproex 500 MG 24 hr tablet Commonly known as: DEPAKOTE ER Take 1,000 mg by mouth every evening.   Humira Pen 40 MG/0.4ML Pnkt Generic drug: Adalimumab Inject 40 mg into the skin  every 14 (fourteen) days.   ibuprofen 200 MG tablet Commonly known as: ADVIL Take 600-800 mg by mouth every 4 (four) hours as needed for moderate pain.   lamoTRIgine 200 MG tablet Commonly known as: LAMICTAL Take 200 mg by mouth every evening.   methocarbamol 500 MG tablet Commonly known as: ROBAXIN Take 2 tablets (1,000 mg total) by mouth every 6 (six) hours as needed for up to 7 days for muscle spasms. What changed: how much to take   naproxen 375 MG tablet Commonly known as: NAPROSYN Take 1 tablet (375 mg total) by mouth 2 (two) times daily with a meal for 14 days.   oxyCODONE 5 MG immediate release tablet Commonly known as: Oxy IR/ROXICODONE Take 2 tablets (10 mg total) by mouth every 6 (six) hours as needed for up to 7 days for severe pain. What changed: how much to take   promethazine 12.5 MG tablet Commonly known as: PHENERGAN Take 12.5 mg by mouth every 6 (six) hours as needed for nausea or vomiting. What changed: Another medication with the same name was removed. Continue taking this medication, and follow the directions you see here.        Disposition and follow-up:   Seth Foster was discharged from [facility] in Stable condition.  At the hospital follow up visit please address:  1.   Right-sided complex empyema Chest tube placed 8/9 and removed 8/13.  Blood culture and pleural fluid were negative to date.  MRSA nasal  swab was also negative. -Patient received 7 days of vancomycin/cefepime and transition to 3 more weeks of Augmentin after discharge. -Patient will need a repeat CT scan in 6 weeks and follow-up with pulmonology at [facility].  Opioid use disorder Patient has a history of OUD with IV opiates use in the past, reported last over 5 years ago.  Patient has used Suboxone in the past but he thought it was not helpful.  Patient is agreeable with trying Suboxone again with [facility].  -He was prescribed 7 days of oxycodone after  discharge, with Robaxin and naproxen.  Normocytic anemia Thrombocytosis Iron studies on 7/16 suggest iron deficiency anemia, which could be related to his lung infection and chest tubes.  He was not started on iron supplement during this admission. -Consider supplement at follow-up if infections resolved.  EKG changes New findings QRS findings in lead 1/3 and t wave inversions in 3, could be secondary to severe pulmonary infection. Echo here showed a moderately enlarged right ventricle without elevation of right atrial pressure and an EF of 50 to 55% with global hypokinesis left ventricle. -Consider repeat echocardiogram when infection resolves.  Crohn's disease Patient will need follow-up with GI for his immunomodulators  Tobacco use disorder -Encourage cessation.  Can offer nicotine patch if agreeable  Type 2 bipolar disorder He has been stable on Depakote and Lamictal prior to admission.  Patient was started on a short course of Xanax during this hospitalization but patient understands that he will not be prescribed benzodiazepine after discharge. -Patient will need to follow-up with a behavioral health provider at follow-up  2.  Labs / imaging needed at time of follow-up: NA  3.  Pending labs/ test needing follow-up: CBC, BMP  Follow-up Appointments: [facility] 2:45 PM with Dr. 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   Right sided complex empyema Multifocal  pneumonia He was recently admitted at [facility] in [place] for right-sided empyema with cultures that grew strep intermedius.  He was discharged on 08/04/2021 after antibiotic treatment, chest tubes, and VATS decortication on Augmentin with a stop date of 09/04/2021. He returned to [facility] ED for right sided pleuritic chest pain and dyspnea. Imaging here show recurrence/persistence of the right sided empyema.  On admission he was tachypneic and definitely uncomfortable but overall hemodynamically stable and concern for sepsis was overall low with lactic of 2->1.9. He was put on Vanc/Cefepime on 08/15/2021. PulmCrit and CT surgery were consulted.   Chest tube was placed on 8/9 and he had 3 rounds of lytic therapy with over 1 L output from the chest tube before output decreased. Chest tube removed on 8/13.  Culture and pleural fluid culture remain negative to date.  Nasal swab MRSA was negative.  Patient was discharged with 3 more weeks of Augmentin and will follow-up with pulmonology for repeat CT chest in 6 weeks.  EKG Changes Prior Ekg from 2021, new findings QRS findings in lead 1/3 and t wave inversions in 3. Possible secondary to severe pulmonary infection.  Echo here showed a moderately enlarged right ventricle without elevation of right atrial pressure and an EF of 50 to 55% with global hypokinesis left ventricle.  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 We do recommend that there be a follow-up echocardiogram after discharge.  Transaminitis  Admitted within AST of 74 and ALT of 82.  Low concern for seeding of the liver but we monitored this and watch for any new right upper quadrant pain.  His transaminitis resolved and he did not show any signs of right upper quadrant pain that would have indicated [place] to get a  right upper quadrant ultrasound during his admission.  Discharge Subjective: Patient was seen at bedside.  He appears comfortable in no acute distress.  He states that pain is reasonably controlled with oxycodone and Robaxin.  Patient is able to ambulate to the restroom by himself.  We discussed discharge plan with 3 more weeks of Augmentin and 7 days of oxycodone.  Patient has used Suboxone in the past and states that it was not helpful.  However he agrees to trying Suboxone again with the [facility].  Patient lives at home with his parents who can help him.  Discharge Exam:   BP (!) 94/50 (BP Location: Right Arm)   Pulse 76   Temp 98.1 F (36.7 C) (Oral)   Resp 20   Ht '5\' 11"'$  (1.803 m)   Wt 62.4 kg   SpO2 99%   BMI 19.19 kg/m  Discharge exam: Physical Exam Constitutional:      General: He is not in acute distress.    Appearance: He is not ill-appearing.  HENT:     Head: Normocephalic.  Eyes:     General: No scleral icterus.       Right eye: No discharge.        Left eye: No discharge.     Conjunctiva/sclera: Conjunctivae normal.  Cardiovascular:     Rate and Rhythm: Normal rate and regular rhythm.     Heart sounds: No murmur heard. Pulmonary:     Effort: Pulmonary effort is normal. No respiratory distress.     Breath sounds: Normal breath sounds. No wheezing.  Musculoskeletal:        General: Normal range of motion.     Cervical back: Normal range of motion.  Skin:    General: Skin is warm.     Comments: Chest tube insertion sites appear clean and noninfected.  There was no bruising or hematoma.  Mild tenderness to palpation.  Neurological:     General: No focal deficit present.     Mental Status: He is alert.  Psychiatric:        Mood and Affect: Mood normal.      Pertinent Labs, Studies, and Procedures:     Latest Ref Rng & Units 08/22/2021    4:59 AM 08/21/2021    7:07 AM 08/20/2021   12:03 AM  CBC  WBC 4.0 - 10.5 K/uL 9.6  9.5  8.2   Hemoglobin 13.0 - 17.0 g/dL 8.6   8.5  8.5   Hematocrit 39.0 - 52.0 % 25.9  25.8  26.3   Platelets 150 - 400 K/uL 423  402  358        Latest Ref Rng & Units 08/21/2021    7:07 AM 08/20/2021  12:03 AM 08/19/2021    3:21 AM  CMP  Glucose 70 - 99 mg/dL 100  85  87   BUN 6 - 20 mg/dL '10  9  13   '$ Creatinine 0.61 - 1.24 mg/dL 0.75  0.73  0.71   Sodium 135 - 145 mmol/L 138  138  140   Potassium 3.5 - 5.1 mmol/L 3.9  3.8  3.6   Chloride 98 - 111 mmol/L 111  111  115   CO2 22 - 32 mmol/L '20  19  21   '$ Calcium 8.9 - 10.3 mg/dL 8.1  8.1  7.9   Total Protein 6.5 - 8.1 g/dL   5.3   Total Bilirubin 0.3 - 1.2 mg/dL   0.5   Alkaline Phos 38 - 126 U/L   82   AST 15 - 41 U/L   12   ALT 0 - 44 U/L   38     DG CHEST PORT 1 VIEW  Result Date: 08/16/2021 CLINICAL DATA:  Chest tube placement EXAM: PORTABLE CHEST 1 VIEW COMPARISON:  Chest x-ray dated August 15, 2021 FINDINGS: Cardiac and mediastinal contours are within normal limits. Interval right-sided chest tube. Decreased size of right pleural effusion. No evidence of pneumothorax. Nodular opacities of the bilateral lower lungs, better evaluated on recent prior chest CT. IMPRESSION: Decreased size of right pleural effusion status post chest tube placement. Electronically Signed   By: Yetta Glassman M.D.   On: 08/16/2021 15:51   ECHOCARDIOGRAM COMPLETE  Result Date: 08/16/2021    ECHOCARDIOGRAM REPORT   Patient Name:   Seth Foster Date of Exam: 08/16/2021 Medical Rec #:  [id number]        Height:       70.0 in Accession #:    [id number]       Weight:       149.0 lb Date of Birth:  04/13/1980        BSA:          1.842 m Patient Age:    41 years         BP:           100/63 mmHg Patient Gender: M                HR:           72 bpm. Exam Location:  Inpatient Procedure: 2D Echo, Cardiac Doppler and Color Doppler Indications:    Abnormal ECG R94.31  History:        Patient has no prior history of Echocardiogram examinations.                 Risk Factors:IVDU and Former Smoker. Opioid use.   Sonographer:    Eartha Inch Referring Phys: Kellen.Fila EMILY B MULLEN  Sonographer Comments: Image acquisition challenging due to respiratory motion and Image acquisition challenging due to patient body habitus. IMPRESSIONS  1. Left ventricular ejection fraction, by estimation, is 50 to 55%. The left ventricle has low normal function. The left ventricle demonstrates global hypokinesis. Left ventricular diastolic parameters were normal.  2. Right ventricular systolic function is normal. The right ventricular size is moderately enlarged.  3. The mitral valve is normal in structure. No evidence of mitral valve regurgitation. No evidence of mitral stenosis.  4. The aortic valve is normal in structure. Aortic valve regurgitation is not visualized. No aortic stenosis is present.  5. The inferior vena cava is normal in size with greater than 50% respiratory variability, suggesting  right atrial pressure of 3 mmHg. FINDINGS  Left Ventricle: Left ventricular ejection fraction, by estimation, is 50 to 55%. The left ventricle has low normal function. The left ventricle demonstrates global hypokinesis. The left ventricular internal cavity size was normal in size. There is no left ventricular hypertrophy. Left ventricular diastolic parameters were normal. Right Ventricle: The right ventricular size is moderately enlarged. No increase in right ventricular wall thickness. Right ventricular systolic function is normal. Left Atrium: Left atrial size was normal in size. Right Atrium: Right atrial size was normal in size. Pericardium: There is no evidence of pericardial effusion. Mitral Valve: The mitral valve is normal in structure. No evidence of mitral valve regurgitation. No evidence of mitral valve stenosis. Tricuspid Valve: The tricuspid valve is normal in structure. Tricuspid valve regurgitation is trivial. No evidence of tricuspid stenosis. Aortic Valve: The aortic valve is normal in structure. Aortic valve regurgitation is not  visualized. No aortic stenosis is present. Pulmonic Valve: The pulmonic valve was normal in structure. Pulmonic valve regurgitation is not visualized. No evidence of pulmonic stenosis. Aorta: The aortic root is normal in size and structure. Venous: The inferior vena cava is normal in size with greater than 50% respiratory variability, suggesting right atrial pressure of 3 mmHg. IAS/Shunts: No atrial level shunt detected by color flow Doppler.  LEFT VENTRICLE PLAX 2D LVIDd:         5.10 cm   Diastology LVIDs:         3.20 cm   LV e' medial:    13.70 cm/s LV PW:         0.80 cm   LV E/e' medial:  6.2 LV IVS:        0.70 cm   LV e' lateral:   18.10 cm/s LVOT diam:     2.10 cm   LV E/e' lateral: 4.7 LV SV:         85 LV SV Index:   46 LVOT Area:     3.46 cm  RIGHT VENTRICLE             IVC RV S prime:     10.80 cm/s  IVC diam: 1.90 cm TAPSE (M-mode): 1.9 cm LEFT ATRIUM             Index        RIGHT ATRIUM           Index LA diam:        3.60 cm 1.95 cm/m   RA Area:     17.00 cm LA Vol (A2C):   54.2 ml 29.43 ml/m  RA Volume:   44.20 ml  24.00 ml/m LA Vol (A4C):   41.3 ml 22.42 ml/m LA Biplane Vol: 47.6 ml 25.84 ml/m  AORTIC VALVE LVOT Vmax:   127.00 cm/s LVOT Vmean:  90.600 cm/s LVOT VTI:    0.245 m  AORTA Ao Root diam: 3.30 cm MITRAL VALVE MV Area (PHT): 2.43 cm    SHUNTS MV Decel Time: 313 msec    Systemic VTI:  0.24 m MV E velocity: 84.90 cm/s  Systemic Diam: 2.10 cm MV A velocity: 61.60 cm/s MV E/A ratio:  1.38 Kardie Tobb DO Electronically signed by Berniece Salines DO Signature Date/Time: 08/16/2021/11:27:36 AM    Final    CT Chest W Contrast  Result Date: 08/15/2021 CLINICAL DATA:  Worsening shortness of breath. Respiratory illness. Recent chest tube drainage of right pleural effusion. EXAM: CT CHEST WITH CONTRAST TECHNIQUE: Multidetector CT imaging of the chest  was performed during intravenous contrast administration. RADIATION DOSE REDUCTION: This exam was performed according to the departmental  dose-optimization program which includes automated exposure control, adjustment of the mA and/or kV according to patient size and/or use of iterative reconstruction technique. CONTRAST:  37m OMNIPAQUE IOHEXOL 300 MG/ML  SOLN COMPARISON:  None Available. FINDINGS: Cardiovascular: No acute findings. Coronary artery atherosclerosis noted. Mediastinum/Nodes: Mild subcarinal lymphadenopathy is seen measuring 1.8 cm in short axis on image 66/3. Other sub-cm lymph nodes are seen throughout the mediastinum and both hilar regions. Mildly enlarged bilateral supraclavicular lymph nodes are seen measuring up to 10 mm short axis. Lungs/Pleura: A moderate size multiloculated right pleural effusion is seen which contains internal gas and also shows peripheral contrast enhancement. This is suspicious for an empyema. Patchy airspace opacity is seen throughout the left lung, greatest in the left lower lobe, most likely representing an infectious or inflammatory etiology. No evidence of left-sided pleural effusion. Upper Abdomen:  Unremarkable. Musculoskeletal:  No suspicious bone lesions. IMPRESSION: Moderate size multiloculated right pleural effusion with internal gas and peripheral contrast enhancement, suspicious for empyema. Patchy airspace opacity throughout the left lung, greatest in the left lower lobe, most likely representing an infectious or inflammatory etiology. Mild mediastinal and bilateral supraclavicular lymphadenopathy, which is nonspecific and may be reactive in etiology. Recommend continued attention on follow-up imaging. Electronically Signed   By: JMarlaine HindM.D.   On: 08/15/2021 16:34   DG Chest 2 View  Result Date: 08/15/2021 CLINICAL DATA:  Shortness of breath. Reportedly, the patient has had a pleural effusion and recent drainage tube. EXAM: CHEST - 2 VIEW COMPARISON:  Chest radiograph 09/30/2020 FINDINGS: Significant change compared to the prior chest radiograph. Opacities throughout the mid and lower  right lung are suggestive for pleural fluid along with parenchymal lung densities. Right paratracheal densities could represent lung densities versus enlarged mediastinal tissue. In addition, there are patchy interstitial and airspace densities in the central aspect of the left lung. Heart size is within normal limits. IMPRESSION: 1. Patchy densities throughout the right hemithorax likely representing a combination of pleural and parenchymal densities. Cannot exclude loculated or complex right pleural effusions. 2. Patchy airspace densities in both lungs and findings could represent multifocal pneumonia. These findings could be better characterized with a chest CT with IV contrast. Electronically Signed   By: AMarkus DaftM.D.   On: 08/15/2021 09:31     Discharge Instructions: Discharge Instructions     Call MD for:  persistant nausea and vomiting   Complete by: As directed    Call MD for:  severe uncontrolled pain   Complete by: As directed    Call MD for:  temperature >100.4   Complete by: As directed    Diet - low sodium heart healthy   Complete by: As directed    Discharge instructions   Complete by: As directed    Mr. BJuday   It was a pleasure taking care of you during this admission.  You were hospitalized for a severe infection of your lung called empyema.  The lung doctor had to place a chest tube to drain out the pus.  You have received IV antibiotic while in the hospital.  You will continue 3 more weeks of Augmentin after discharge.  I have prescribed 7 days of oxycodone 10 mg with the Robaxin and naproxen for pain control.  Please follow-up with Dr. ACoy Saunas your new PCP [facility] Monday 08/28/21 at 2:45 pm.  They will discuss with you about possible starting you on Suboxone.  Please follow-up with your lung doctor for repeat imaging of your chest in 6 weeks.  Take care,  Dr. Alfonse Spruce   Increase activity slowly   Complete by: As  directed    No wound care   Complete by: As directed        Signed: Gaylan Gerold, DO 08/22/2021, 2:56 PM   Pager: [phone]

## 2021-08-22 NOTE — Telephone Encounter (Signed)
Called patient to schedule 6 weeks follow up, patient stated he wants to wait until he is discharged from the hospital to schedule

## 2021-08-23 NOTE — Telephone Encounter (Signed)
Noted     Closing encounter

## 2021-08-28 ENCOUNTER — Ambulatory Visit (INDEPENDENT_AMBULATORY_CARE_PROVIDER_SITE_OTHER): Payer: Medicare Other | Admitting: Student

## 2021-08-28 ENCOUNTER — Encounter: Payer: Self-pay | Admitting: Student

## 2021-08-28 ENCOUNTER — Ambulatory Visit (INDEPENDENT_AMBULATORY_CARE_PROVIDER_SITE_OTHER): Payer: Medicare Other

## 2021-08-28 ENCOUNTER — Other Ambulatory Visit: Payer: Self-pay

## 2021-08-28 VITALS — BP 115/71 | HR 61 | Temp 97.8°F | Ht 71.0 in | Wt 138.0 lb

## 2021-08-28 DIAGNOSIS — Z0000 Encounter for general adult medical examination without abnormal findings: Secondary | ICD-10-CM

## 2021-08-28 DIAGNOSIS — K50918 Crohn's disease, unspecified, with other complication: Secondary | ICD-10-CM | POA: Diagnosis not present

## 2021-08-28 DIAGNOSIS — J869 Pyothorax without fistula: Secondary | ICD-10-CM | POA: Diagnosis not present

## 2021-08-28 DIAGNOSIS — F11288 Opioid dependence with other opioid-induced disorder: Secondary | ICD-10-CM | POA: Diagnosis not present

## 2021-08-28 NOTE — Progress Notes (Unsigned)
CC: Hospital f/u   HPI:  [name] is a 41 y.o. male with PMH as below who presents to clinic today for hospital follow-up after recent hospitalization for persistent right lung empyema. Please see problem based charting for evaluation, assessment and plan.   Past Medical History:  Diagnosis Date   Asthma    Chronic pain syndrome    Crohn disease ([place])     Review of Systems:  Constitutional: Negative for fever or fatigue Eyes: Negative for visual changes Respiratory: Positive for occasional shortness of breath and dyspnea on exertion Cardiac: Negative for chest pain or palpitations MSK: Positive for back pain Abdomen: Positive for occasional constipation. Negative for abdominal pain or diarrhea Neuro: Negative for headache or weakness  Physical Exam: General: Pleasant, slightly underweight middle-age man. No acute distress. Cardiac: RRR. No murmurs, rubs or gallops. No LE edema Respiratory: Lungs CTAB. No wheezing or crackles. Decreased air movement in the RLL and RML.  Abdominal: Soft, symmetric and non tender. Normal BS. MSK: Chest tube insertion sites healing appropriately. Extremities: Atraumatic. Full ROM. Palpable radial and DP pulses. Neuro: A&O x 3. Moves all extremities. Normal sensation to gross touch. Psych: Appropriate mood and affect.  Vitals:   08/28/21 1511  BP: 115/71  Pulse: 61  Temp: 97.8 F (36.6 C)  TempSrc: Oral  SpO2: 100%  Weight: 138 lb 4.8 oz (62.7 kg)  Height: '5\' 11"'$  (1.803 m)    Assessment & Plan:   Empyema of right pleural space [facility]) Patient here for hospital follow-up after being hospitalized from 8/8 to 8/15 for persistent right lung empyema. Patient was initially admitted at [facility] in [place] for right-sided empyema with cultures that grew strep intermedius. This was managed with antibiotics, chest tube, and VATS decortication and he was discharged on 7/28 on Augmentin with a stop date of 09/04/2021. He  presented to [facility] for right sided pleuritic chest pain and dyspnea and found to have recurrence/persistence of the right sided empyema on imaging. He was put on Vanc/Cefepime and chest tube was placed on 8/9, removed 8/13. Blood culture and pleural fluid were negative. He was transitioned to Augmentin and discharged home to finish 3 more weeks of antibiotics with plan for repeat CT chest. He was also instructed to follow up with pulmonology. Since discharge, patient states he has been feeling fine. He does report occasional shortness of breath and dyspnea on exertion but denies any cough, fevers or chills. He continues to endorse pain around the chest tube insertion site.   Plan: -Follow-up appointment with pulmonology on 9/14 -Continue Augmentin for total of 21 days, end date September 6th -Follow-up with PCP as needed  Crohn's disease of intestine [facility]) Patient is scheduled to follow-up with GI at [facility] on 9/13 for evaluation and immunomodulators.  Opioid dependence ([place]) Patient has a history of OUD with IV opiates use in the past, reported last over 5 years ago. Patient has previous history of suboxone therapy in the past but he thought it was not very helpful. He was first given tramadol after his first hospitalization in [place] which he said helped with his pain. He was given 7 days of oxycodone after his recent discharge from [place] his PCP refilled his initial tramadol order which he is currently taking but will run out soon. States the tramadol helps better than the Oxy. He will follow-up with his PCP this Friday. He also plans to re-engage with his psychologist/substance use treatment center  so he can get off opioids. Patient was informed that we could also provide Suboxone therapy if he decides to switch his care to [place].  Patient would like to continue his established relationship with his previous providers as he currently lives in  [place]. -Follow-up with PCP and psychologist/substance use team    See Encounters Tab for problem based charting.  Patient discussed with Dr. Gardiner Sleeper, MD, MPH

## 2021-08-28 NOTE — Patient Instructions (Signed)
Thank you, [name] for allowing [place] to provide your care today. Today we discussed your recent hospitalization. I am glad you are doing better. Please follow with your PCP, GI and pulmonology as scheduled. Call your PCP if you have worsening shortness of breath or pain.   My Chart Access: [email or web address]  Please follow-up with your PCP on Friday  Please make sure to arrive 15 minutes prior to your next appointment. If you arrive late, you may be asked to reschedule.    We look forward to seeing you next time. Please call our clinic at [phone] if you have any questions or concerns. The best time to call is Monday-Friday from 9am-4pm, but there is someone available 24/7. If after hours or the weekend, call the main hospital number and ask for the Internal Medicine Resident On-Call. If you need medication refills, please notify your pharmacy one week in advance and they will send [place] a request.   Thank you for letting us take part in your care. Wishing you the best!  Lacinda Axon, MD 08/28/2021, 4:11 PM IM Resident, PGY-3 [facility]ian 41:10

## 2021-08-29 ENCOUNTER — Encounter: Payer: Self-pay | Admitting: Student

## 2021-08-29 NOTE — Progress Notes (Signed)
Subjective:   Seth Foster is a 41 y.o. male who presents for an Initial Medicare Annual Wellness Visit. I connected with  Seth Foster on 08/29/21 by a  Face-To-Face  enabled telemedicine application and verified that I am speaking with the correct person using two identifiers.  Patient Location: Other:  Office/Clinic  Provider Location: Office/Clinic  I discussed the limitations of evaluation and management by telemedicine. The patient expressed understanding and agreed to proceed.  Review of Systems    Defer to PCP       Objective:    Today's Vitals   08/28/21 1625 08/28/21 1626  BP: 115/71   Pulse: 61   Temp: 97.8 F (36.6 C)   TempSrc: Oral   SpO2: 100%   Weight: 138 lb (62.6 kg)   Height: '5\' 11"'$  (1.803 m)   PainSc:  4    Body mass index is 19.25 kg/m.     08/28/2021    4:30 PM 08/28/2021    3:15 PM 08/16/2021    6:00 AM 09/30/2014    1:25 AM  Advanced Directives  Does Patient Have a Medical Advance Directive? No No No No  Would patient like information on creating a medical advance directive? No - Patient declined No - Patient declined No - Patient declined     Current Medications (verified) Outpatient Encounter Medications as of 08/28/2021  Medication Sig   acetaminophen (TYLENOL) 500 MG tablet Take 1,000 mg by mouth every 4 (four) hours as needed for moderate pain.   albuterol (PROVENTIL HFA;VENTOLIN HFA) 108 (90 BASE) MCG/ACT inhaler Inhale 2 puffs into the lungs every 6 (six) hours as needed for wheezing or shortness of breath.   amoxicillin-clavulanate (AUGMENTIN) 875-125 MG tablet Take 1 tablet by mouth 2 (two) times daily for 21 days.   divalproex (DEPAKOTE ER) 500 MG 24 hr tablet Take 1,000 mg by mouth every evening.   HUMIRA PEN 40 MG/0.4ML PNKT Inject 40 mg into the skin every 14 (fourteen) days.   ibuprofen (ADVIL) 200 MG tablet Take 600-800 mg by mouth every 4 (four) hours as needed for moderate pain.   lamoTRIgine (LAMICTAL) 200 MG tablet  Take 200 mg by mouth every evening.   methocarbamol (ROBAXIN) 500 MG tablet Take 2 tablets (1,000 mg total) by mouth every 6 (six) hours as needed for up to 7 days for muscle spasms.   naproxen (NAPROSYN) 375 MG tablet Take 1 tablet (375 mg total) by mouth 2 (two) times daily with a meal for 14 days.   oxyCODONE (OXY IR/ROXICODONE) 5 MG immediate release tablet Take 2 tablets (10 mg total) by mouth every 6 (six) hours as needed for up to 7 days for severe pain.   promethazine (PHENERGAN) 12.5 MG tablet Take 12.5 mg by mouth every 6 (six) hours as needed for nausea or vomiting.   No facility-administered encounter medications on file as of 08/28/2021.    Allergies (verified) Benadryl [diphenhydramine hcl], Sulfa antibiotics, Broccoli [brassica oleracea], and Scallops [shellfish allergy]   History: Past Medical History:  Diagnosis Date   Asthma    Chronic pain syndrome    Crohn disease ([place])    Past Surgical History:  Procedure Laterality Date   ABDOMINAL SURGERY     CHEST TUBE INSERTION N/A 08/16/2021   Procedure: CHEST TUBE INSERTION;  Surgeon: Juanito Doom, MD;  Location: [facility];  Service: Cardiopulmonary;  Laterality: N/A;   surgery for Crohns     History reviewed. No pertinent family history. Social  History   Socioeconomic History   Marital status: Single    Spouse name: Not on file   Number of children: Not on file   Years of education: Not on file   Highest education level: Not on file  Occupational History   Not on file  Tobacco Use   Smoking status: Every Day    Packs/day: 0.50    Years: 16.00    Total pack years: 8.00    Types: Cigarettes   Smokeless tobacco: Never  Substance and Sexual Activity   Alcohol use: Yes    Alcohol/week: 0.0 standard drinks of alcohol   Drug use: Yes    Types: Marijuana   Sexual activity: Not on file  Other Topics Concern   Not on file  Social History Narrative   Not on file   Social Determinants of Health    Financial Resource Strain: Low Risk  (08/28/2021)   Overall Financial Resource Strain (CARDIA)    Difficulty of Paying Living Expenses: Not hard at all  Food Insecurity: No Food Insecurity (08/28/2021)   Hunger Vital Sign    Worried About Running Out of Food in the Last Year: Never true    Ran Out of Food in the Last Year: Never true  Transportation Needs: No Transportation Needs (08/28/2021)   PRAPARE - Hydrologist (Medical): No    Lack of Transportation (Non-Medical): No  Physical Activity: Inactive (08/28/2021)   Exercise Vital Sign    Days of Exercise per Week: 0 days    Minutes of Exercise per Session: 0 min  Stress: No Stress Concern Present (08/28/2021)   [employer]    Feeling of Stress : Not at all  Social Connections: Moderately Integrated (08/28/2021)   Social Connection and Isolation Panel [NHANES]    Frequency of Communication with Friends and Family: More than three times a week    Frequency of Social Gatherings with Friends and Family: More than three times a week    Attends Religious Services: More than 4 times per year    Active Member of [employer] or Organizations: No    Attends Music therapist: More than 4 times per year    Marital Status: Never married    Tobacco Counseling Ready to quit: Not Answered Counseling given: Not Answered   Clinical Intake:  Pre-visit preparation completed: Yes  Pain : 0-10 Pain Score: 4  Pain Type: Chronic pain Pain Location: Flank Pain Orientation: Right Pain Descriptors / Indicators: Aching, Sharp, Shooting, Heaviness Pain Frequency: Constant     Nutritional Risks: None Diabetes: No  How often do you need to have someone help you when you read instructions, pamphlets, or other written materials from your doctor or pharmacy?: 1 - Never What is the last grade level you completed in school?: 14  years  Diabetic?No  Interpreter Needed?: No  Information entered by :: [name],cma 08/28/21 4:27pm   Activities of Daily Living    08/28/2021    4:30 PM 08/28/2021    3:17 PM  In your present state of health, do you have any difficulty performing the following activities:  Hearing? 1 1  Vision? 0 0  Difficulty concentrating or making decisions? 1 1  Comment  AT TIMES  Walking or climbing stairs? 1 1  Dressing or bathing? 0 1  Doing errands, shopping? 0 0    Patient Care Team: Nall, Sharla Kidney as PCP - General (Physician  Assistant) Bradly Chris, MD as Referring Physician (Gastroenterology)  Indicate any recent Medical Services you may have received from other than [facility] providers in the past year (date may be approximate).     Assessment:   This is a routine wellness examination for Seth Foster.  Hearing/Vision screen No results found.  Dietary issues and exercise activities discussed:     Goals Addressed   None   Depression Screen    08/28/2021    4:28 PM 08/28/2021    3:16 PM 08/29/2015    5:25 PM 08/17/2015    4:43 PM 07/27/2015    4:23 PM 06/29/2015    8:47 AM 06/16/2015    4:47 PM  PHQ 2/9 Scores  PHQ - 2 Score 3 3 0 0 0 0 0  PHQ- 9 Score 17 17         Fall Risk    08/28/2021    4:30 PM 08/28/2021    3:16 PM 08/29/2015    5:25 PM 08/17/2015    4:43 PM 07/27/2015    4:23 PM  [place] in the past year? 0 0 No No No  Number falls in past yr: 0      Injury with Fall? 0      Risk for fall due to : No Fall Risks No Fall Risks     Follow up Falls evaluation completed;Falls prevention discussed Falls evaluation completed       FALL RISK PREVENTION PERTAINING TO THE HOME:  Any stairs in or around the home? No  If so, are there any without handrails? No  Home free of loose throw rugs in walkways, pet beds, electrical cords, etc? Yes  Adequate lighting in your home to reduce risk of falls? Yes   ASSISTIVE DEVICES UTILIZED TO PREVENT  FALLS:  Life alert? No  Use of a cane, walker or w/c? No  Grab bars in the bathroom? No  Shower chair or bench in shower? No  Elevated toilet seat or a handicapped toilet? No   TIMED UP AND GO:  Was the test performed? No .  Length of time to ambulate 10 feet: N/A sec.   Gait slow and steady without use of assistive device  Cognitive Function:        08/28/2021    4:31 PM  6CIT Screen  What Year? 0 points  What month? 0 points  What time? 0 points  Count back from 20 0 points  Months in reverse 0 points  Repeat phrase 0 points  Total Score 0 points    Immunizations  There is no immunization history on file for this patient.  TDAP status: Due, Education has been provided regarding the importance of this vaccine. Advised may receive this vaccine at local pharmacy or Health Dept. Aware to provide a copy of the vaccination record if obtained from local pharmacy or Health Dept. Verbalized acceptance and understanding.  Flu Vaccine status: Due, Education has been provided regarding the importance of this vaccine. Advised may receive this vaccine at local pharmacy or Health Dept. Aware to provide a copy of the vaccination record if obtained from local pharmacy or Health Dept. Verbalized acceptance and understanding.  Covid-19 vaccine status: Information provided on how to obtain vaccines.   Qualifies for Shingles Vaccine? No   Zostavax completed No   Shingrix Completed?: No.    Education has been provided regarding the importance of this vaccine. Patient has been advised to call insurance company to determine out of  pocket expense if they have not yet received this vaccine. Advised may also receive vaccine at local pharmacy or Health Dept. Verbalized acceptance and understanding.  Screening Tests Health Maintenance  Topic Date Due   COVID-19 Vaccine (1) Never done   TETANUS/TDAP  Never done   INFLUENZA VACCINE  08/08/2021   Hepatitis C Screening  Completed   HIV Screening   Completed   HPV VACCINES  Aged Out    Health Maintenance  Health Maintenance Due  Topic Date Due   COVID-19 Vaccine (1) Never done   TETANUS/TDAP  Never done   INFLUENZA VACCINE  08/08/2021     Lung Cancer Screening: (Low Dose CT Chest recommended if Age 51-80 years, 30 pack-year currently smoking OR have quit w/in 15years.) does not qualify.   Lung Cancer Screening Referral: N/A  Additional Screening:  Hepatitis C Screening: does not qualify; Completed 06/08/201  Vision Screening: Recommended annual ophthalmology exams for early detection of glaucoma and other disorders of the eye. Is the patient up to date with their annual eye exam?  Yes  Who is the provider or what is the name of the office in which the patient attends annual eye exams? N/A If pt is not established with a provider, would they like to be referred to a provider to establish care? No .   Dental Screening: Recommended annual dental exams for proper oral hygiene  Community Resource Referral / Chronic Care Management: CRR required this visit?  No   CCM required this visit?  No      Plan:     I have personally reviewed and noted the following in the patient's chart:   Medical and social history Use of alcohol, tobacco or illicit drugs  Current medications and supplements including opioid prescriptions. Patient is currently taking opioid prescriptions. Information provided to patient regarding non-opioid alternatives. Patient advised to discuss non-opioid treatment plan with their provider. Functional ability and status Nutritional status Physical activity Advanced directives List of other physicians Hospitalizations, surgeries, and ER visits in previous 12 months Vitals Screenings to include cognitive, depression, and falls Referrals and appointments  In addition, I have reviewed and discussed with patient certain preventive protocols, quality metrics, and best practice recommendations. A written  personalized care plan for preventive services as well as general preventive health recommendations were provided to patient.     Kerin Perna, [facility]   08/29/2021   Nurse Notes: Face-To-Face 5 minute visit . Seth Foster , Thank you for taking time to come for your Medicare Wellness Visit. I appreciate your ongoing commitment to your health goals. Please review the following plan we discussed and let me know if I can assist you in the future.   These are the goals we discussed:  Goals   None     This is a list of the screening recommended for you and due dates:  Health Maintenance  Topic Date Due   COVID-19 Vaccine (1) Never done   Tetanus Vaccine  Never done   Flu Shot  08/08/2021   Hepatitis C Screening: USPSTF Recommendation to screen - Ages 89-79 yo.  Completed   HIV Screening  Completed   HPV Vaccine  Aged Out

## 2021-08-29 NOTE — Assessment & Plan Note (Signed)
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 Patient has previous history of suboxone therapy in the past but he thought it was not very helpful. He was first given tramadol after his first hospitalization in [place] which he said helped with his pain. He was given 7 days of oxycodone after his recent discharge from [facility] his PCP refilled his initial tramadol order which he is currently taking but will run out soon. States the tramadol helps better than the Oxy. He will follow-up with his PCP this Friday. He also plans to re-engage with his psychologist/substance use treatment center so he can get off opioids. Patient was informed that we could also provide Suboxone therapy if he decides to switch his care to [place].  Patient would like to continue his established relationship with his previous providers as he currently lives in [place]. -Follow-up with PCP and psychologist/substance use team

## 2021-08-29 NOTE — Assessment & Plan Note (Signed)
Patient here for hospital follow-up after being hospitalized from 8/8 to 8/15 for persistent right lung empyema. Patient was initially admitted at [facility] in [place] for right-sided empyema with cultures that grew strep intermedius. This was managed with antibiotics, chest tube, and VATS decortication and he was discharged on 7/28 on Augmentin with a stop date of 09/04/2021. He presented to [facility] for right sided pleuritic chest pain and dyspnea and found to have recurrence/persistence of the right sided empyema on imaging. He was put on Vanc/Cefepime and chest tube was placed on 8/9, removed 8/13. Blood culture and pleural fluid were negative. He was transitioned to Augmentin and discharged home to finish 3 more weeks of antibiotics with plan for repeat CT chest. He was also instructed to follow up with pulmonology. Since discharge, patient states he has been feeling fine. He does report occasional shortness of breath and dyspnea on exertion but denies any cough, fevers or chills. He continues to endorse pain around the chest tube insertion site.   Plan: -Follow-up appointment with pulmonology on 9/14 -Continue Augmentin for total of 21 days, end date September 6th -Follow-up with PCP as needed

## 2021-08-29 NOTE — Assessment & Plan Note (Signed)
Patient is scheduled to follow-up with GI at [facility] on 9/13 for evaluation and immunomodulators.

## 2021-08-30 DIAGNOSIS — R52 Pain, unspecified: Secondary | ICD-10-CM | POA: Diagnosis not present

## 2021-08-30 DIAGNOSIS — K5090 Crohn's disease, unspecified, without complications: Secondary | ICD-10-CM | POA: Diagnosis not present

## 2021-08-31 NOTE — Progress Notes (Signed)
Internal Medicine Clinic Attending  Case and documentation of Dr. Coy Saunas  reviewed.  I reviewed the AWV findings.  I agree with the assessment, diagnosis, and plan of care documented in the AWV note.

## 2021-08-31 NOTE — Progress Notes (Signed)
Internal Medicine Clinic Attending  Case discussed with Dr. Amponsah  At the time of the visit.  We reviewed the resident's history and exam and pertinent patient test results.  I agree with the assessment, diagnosis, and plan of care documented in the resident's note.  

## 2021-09-04 DIAGNOSIS — K50919 Crohn's disease, unspecified, with unspecified complications: Principal | ICD-10-CM

## 2021-09-19 ENCOUNTER — Ambulatory Visit: Admit: 2021-09-19 | Discharge: 2021-09-20 | Payer: MEDICARE | Attending: Gastroenterology | Primary: Gastroenterology

## 2021-09-19 DIAGNOSIS — K50912 Crohn's disease, unspecified, with intestinal obstruction: Secondary | ICD-10-CM | POA: Diagnosis not present

## 2021-09-19 MED ORDER — MERCAPTOPURINE 50 MG TABLET
ORAL_TABLET | 0 refills | 0 days | Status: CP
Start: 2021-09-19 — End: ?

## 2021-09-19 MED ORDER — SHINGRIX (PF) 50 MCG/0.5 ML INTRAMUSCULAR SUSPENSION, KIT
Freq: Once | INTRAMUSCULAR | 0 refills | 1 days | Status: CP
Start: 2021-09-19 — End: 2021-09-19

## 2021-09-19 MED ORDER — LOPERAMIDE 2 MG CAPSULE
ORAL_CAPSULE | 3 refills | 0 days | Status: CP
Start: 2021-09-19 — End: ?

## 2021-09-19 NOTE — Unmapped (Signed)
REASON FOR VISIT:  Crohn's    HISTORY OF PRESENT ILLNESS:  Since last visit,  05/01/2021, pilonidal cyst excision.  07/18/2021, colonoscopy to TI x1 cm.  Fair prep.  Nondraining sinus tract versus fistula on perianal exam.  Edema, small superficial ulcers and mild stenosis (1 cm) at ileocolonic anastomosis.  Dilated to 13.5 mm.  Anastomosis was traversed from, but deeper intubation of the ileum was prevented by looping.  07/27/2021 - 08/04/2021, admitted to [place] [facility] for right lower lobe pneumonia and empyema (Strep intermedius).  Treated with chest tube, VATS decortication.  Stop adalimumab and 6-MP.  Augmentin x4 weeks.   08/15/2021 - 08/22/2021, readmitted to [facility] for right lung empyema.  Treated with chest tube, IV antibiotics.  Discharged on 3 weeks of Augmentin.  08/15/2021, HIV negative  08/19/2021, AST 12, albumin 2.2  08/22/2021, CBC, BMP normal except hemoglobin 8.6, platelets 423, CO2 20    Finished Augmentin about 6 days ago. Feels bloated. No dyspnea or cough. No fevers. Having 4-5 soft, nonbloody bms per day (was worse on augmentin). No recent loperamide use. Mild nausea, no vomiting. Mild epig and LLQ abdominal pain-better after bm.  Takes 4-6 tabs of naproxen daily for post-surgical/chest pain. No post-prandial symptoms. No perianal drainage. Night time fecal incontinence x 1 last week.    MEDICATIONS:  has a current medication list which includes the following prescription(s): acetaminophen, alprazolam, buprenorphine-naloxone, chlorhexidine, cyanocobalamin (vitamin b-12), divalproex er, empty container, ferrous sulfate, folic acid, lamotrigine, levalbuterol, loperamide, methocarbamol, naproxen, naproxen, promethazine, UNABLE TO FIND, and albuterol.    ALLERGIES:  Allergies as of 09/19/2021 - Reviewed 09/19/2021   Allergen Reaction Noted    Acetaminophen Nausea And Vomiting 04/27/2012    Benadryl [diphenhydramine hcl] Anaphylaxis 04/27/2012    Sulfa (sulfonamide antibiotics) Hives 04/27/2012 Rozerem [ramelteon] Other (See Comments) 05/11/2014    Shellfish containing products Diarrhea and Nausea And Vomiting 06/04/2011       PAST MEDICAL HISTORY:  1. Crohn's disease diagnosed as ileocolitis in 2007, treated initially with Pentasa and prednisone. Infliximab with partial response. Imuran with no help. Infusion reaction during infliximab retreatment. Adalimumab provided no benefit. Certolizumab with mild improvement but plateaued despite once weekly  dosing. In 12/2007, CT with ileitis, internal fistula and abscess. 02/04/08, ielocolic resection (40cm ti) by Dr. [place] Sever in [place]. 07/2008, diarrhea. 05/03/09, thickening of the neoterminal ileum on CT scan. 6-MP started 50 mg per day. 05/03/09, start adalimumab. 06/01/09, 6-MP increased to 75 mg. 07/19/09, desipramine to 100 mg per day without benefit. 11/10/2009, 6 inches of ileal resection (stricture) with primary anastomosis. 12/20/2009, reluctant to restart immunosuppression. 03/21/10, stopped desipramine due to lack of benefit and start Cymbalta. 05/25/10 Colonoscopy with i3 recurrence. Restart 6-MP 75mg , but declined adalimumab due to toe infections. 03/01/11, 6 inches of small bowel resected just proximal to the anastomosis. 05/22/11 start oral methotrexate. 06/04/11, CT abdomen and pelvis showed small-bowel dilation to 3.4 cm, prominent thickening of the ileocolonic anastomosis. Start ustekinumab. 10/26/11, colonoscopy showed minimal improvement. MTX switched to 25mg  .  02/20/13, colonoscopy showed I2 disease (possibly mildly improved).  Patient self stopped methotrexate due to perceived increase infectious complications.  Continued ustekinumab monotherapy.  2017, stopped ustekinumab due to incarceration.  2018, worsening symptoms, start adalimumab in prison with improvement.  08/2019, stopped adalimumab upon release from prison.  Worsening symptoms. 09/08/2020, start 6mp 75mg  daily. 12/06/2020, start adalimumab. 05/01/2021, pilonidal cyst excision.2. 06/2014, pilonidal cyst excision. 07/27/21, strep intermedius empyema of the left lung. and adalimumab stopped.  3. Bipolar disorder  4. Substance use disorder  5. 09/08/20, PCV-20  6.  09/2020, QuantiFERON gold positive. 10/28/2020, [facility] health department determined patient did not need treatment for LTBI based on 2 repeat negative tests.  7.  02/09/2021, Shingrix No. 1    SOCIAL HISTORY:  Quit smoking 10 days ago.    FAMILY HISTORY:  No colon cancer    PHYSICAL EXAM:  BP 147/80 (BP Site: R Arm, BP Position: Sitting, BP Cuff Size: Medium)  - Pulse 58  - Ht 180.3 cm (5' 11)  - Wt 66.6 kg (146 lb 14.4 oz)  - BMI 20.49 kg/m??   Wt Readings from Last 4 Encounters:   09/19/21 66.6 kg (146 lb 14.4 oz)   07/18/21 63.5 kg (140 lb)   07/04/21 66.3 kg (146 lb 3.2 oz)   05/30/21 68.9 kg (151 lb 12.8 oz)     CONSTITUTIONAL: awake, alert, NAD  EYES: no scleral icterus or conjunctival injection  GASTROINTESTINAL: Abdomen is soft, mild to moderate generalized abdominal tenderness, nondistended, no mass  NEUROLOGIC: oriented x 3  Perianal exam: Mild erythema in the perianal skin.  Moderate tenderness anterior and left of the anal verge.  No fluctuance or visible abscess.    TEST DATA:  1. 05/03/09, CT scan with thickening of neoterminal ileum and subcm mesenteric lymphadenopathy. No stranding, abscess, or fistula.   2.08/26/2009, EGD was normal.   3.08/26/2009, colonoscopy to the neoterminal ileum. Poor bowel prep. Edema, erythema and shallow ulcerations at ICA and neoterminal ileum. Benign-appearing mild stenosis just proximal to ICA that was not traversed.  4. 10/28/2009, small bowel follow through showed 4-hour transit time, adherent small bowel loops in the pelvis that were difficult to separate, 1.5-2 cm segment of stricture in the neoterminal ileum that was 4 mm in diameter, more proximal 2.25 cm of neoterminal ileum was mildly narrowed.   5. 05/20/10, colonoscopy to neoterminal ileum with poor prep, i3 recurrence.   6. 02/07/2011, colonoscopy to 5 cm in the terminal ileum showed a linear ulcer at the ileocolonic anastomosis and severe ulceration in the neoterminal ileum that was worse than before. Terminal ileum could not be intubated more deeply due to edema and/or stricturing.   7. 06/04/11, CT abdomen and pelvis showed small-bowel dilatation to 3.4 cm, gas and stool in colon, prominent thickening at the ileocolonic anastomosis  8. 10/26/11 Colonoscopy- ICA with linear ulceration confined to the anastamotic line. >5 medium sized aphthous ulcers with normal intervening mucosa in the neoti. Slightly improved compared to prior pre-op colonoscopy.   9. 02/20/13, colonoscopy. Normal ICA. i2 disease in the distal 6 cm of TI.  Slightly improved.  10. 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  09/12/2020, CTAP.  Gallbladder wall thickening.  Marked thickening of the terminal ileum x20 cm.  2 cm mesenteric lymphadenopathy  12.  09/12/2020, right upper quadrant ultrasound.  Gallbladder sludge, wall thickening.  13.  09/14/2020, HIDA scan.  Normal.  14. 07/18/2021, colonoscopy to TI x1 cm.  Fair prep.  Nondraining sinus tract versus fistula on perianal exam.  Edema, small superficial ulcers and mild stenosis (1 cm) at ileocolonic anastomosis.  Dilated to 13.5 mm.  Anastomosis was traversed from, but deeper intubation of the ileum was prevented by looping.     ASSESSMENT:  1. Ileal Crohn's disease.  Stricturing phenotype.  Currently mildly symptomatic with diarrhea off therapy due to recent infection.  Probably due to Crohn's ileitis.  Recommend resuming adalimumab and azathioprine after cleared by pulmonologist.  Reviewed risks including risks of infection, malignancy, hepatitis, pancreatitis, cytopenias, psoriasis, inflammatory reactions in the liver, nerves.  These medicines require intensive monitoring for toxicity.  Continue Imodium and antiemetics as needed for symptom control.  May need bile salt binder in the future to reduce diarrhea.  2.  By patient report, Imuran caused possible rash and flushing, 6-MP and methotrexate increased infections.   3.  Soon-to-be medically immune suppressed, in need of vaccination.  4.  Recent empyema of unclear etiology.  Status post chest tube and antibiotic therapy.  May be a dental source.  Follow-up with pulmonologist on Thursday as scheduled.    RECOMMENDATIONS AND PLAN:  Patient Instructions   -Continue efforts to stop smoking  -Avoid NSAID use (aspirin, naproxen, ibuprofen, aleve, motrin, excedrin, etc) as these can worsen crohn's. Tylenol is fine.  -Ask lung specialist if it's ok for you to resume immune suppression ( and Humira). If they say ok, then start 75mg  by mouth once daily AND send me a mychart message so that I can ask Jacki Cones to get you new loading dose of Humira (160mg  on day 1, 80mg  on day 14, and then 40mg  every 14 days thereafter).  -Bloodwork at [facility] 2 weeks after starting and then every 12 weeks thereafter indefinitely.  -OK to use loperamide (Imodium) 2-4mg  by mouth every 4 hrs as needed for loose stools  -Soak bottom in warm water 20 min three times daily, then blot dry with towel, and then use desitin cream as needed for perianal skin pain  -Shingrix shingles vaccine [facility]2 at your [facility]. I sent in a prescription  -Flu shot and covid vaccine in October  -Clinic visit with me in 3 months

## 2021-09-19 NOTE — Unmapped (Addendum)
-  Continue efforts to stop smoking  -Avoid NSAID use (aspirin, naproxen, ibuprofen, aleve, motrin, excedrin, etc) as these can worsen crohn's. Tylenol is fine.  -Ask lung specialist if it's ok for you to resume immune suppression ( and Humira). If they say ok, then start 75mg  by mouth once daily AND send me a mychart message so that I can ask Jacki Cones to get you new loading dose of Humira (160mg  on day 1, 80mg  on day 14, and then 40mg  every 14 days thereafter).  -Bloodwork at [facility] 2 weeks after starting and then every 12 weeks thereafter indefinitely.  -OK to use loperamide (Imodium) 2-4mg  by mouth every 4 hrs as needed for loose stools  -Soak bottom in warm water 20 min three times daily, then blot dry with towel, and then use desitin cream as needed for perianal skin pain  -Shingrix shingles vaccine #2 [facility] your [facility]. I sent in a prescription  -Flu shot and covid vaccine in October  -Clinic visit with me in 3 months

## 2021-09-19 NOTE — Unmapped (Signed)
Pre called and triaged. Confirmed appointment time and location.

## 2021-09-21 ENCOUNTER — Ambulatory Visit (HOSPITAL_COMMUNITY)
Admission: RE | Admit: 2021-09-21 | Discharge: 2021-09-21 | Disposition: A | Discharge status: Home / Self Care | Payer: Medicare Other | Source: Ambulatory Visit | Attending: Internal Medicine | Admitting: Internal Medicine

## 2021-09-21 ENCOUNTER — Encounter: Payer: Self-pay | Admitting: Internal Medicine

## 2021-09-21 ENCOUNTER — Ambulatory Visit (INDEPENDENT_AMBULATORY_CARE_PROVIDER_SITE_OTHER): Payer: Medicare Other | Admitting: Internal Medicine

## 2021-09-21 DIAGNOSIS — J869 Pyothorax without fistula: Secondary | ICD-10-CM

## 2021-09-21 DIAGNOSIS — G8922 Chronic post-thoracotomy pain: Secondary | ICD-10-CM | POA: Diagnosis not present

## 2021-09-21 DIAGNOSIS — Z87891 Personal history of nicotine dependence: Secondary | ICD-10-CM | POA: Diagnosis not present

## 2021-09-21 DIAGNOSIS — J90 Pleural effusion, not elsewhere classified: Secondary | ICD-10-CM | POA: Diagnosis not present

## 2021-09-21 NOTE — Progress Notes (Unsigned)
Seth Foster, male    DOB: [birth year]    MRN: [id number]   Brief patient profile:  41  yowm  quit smoking summer 2023  referred to pulmonary clinic in [place]  09/21/2021 by Seth Foster  for post hops f/u for empyema     Date of Admission: 08/15/2021  8:48 AM Date of Discharge: No discharge date for patient encounter. Attending Physician: Seth Groves, DO   Discharge Diagnosis: Empyema of the right pleural space Crohn's disease of intestine Normocytic anemia Opioid use disorder Type II bipolar Tobacco use disorder   Rt-sided complex empyema Chest tube placed 8/9 and removed 8/13.  Blood culture and pleural fluid were negative to date.  MRSA nasal swab was also negative. -Patient received 7 days of vancomycin/cefepime and transition to 3 more weeks of Augmentin after discharge. -Patient will need a repeat CT scan in 6 weeks and follow-up with pulmonology at [facility].   Opioid use disorder Patient has a history of OUD with IV opiates use in the past, reported last over 5 years ago.  Patient has used Suboxone in the past but he thought it was not helpful.  Patient is agreeable with trying Suboxone again with [facility].  -He was prescribed 7 days of oxycodone after discharge, with Robaxin and naproxen.   Normocytic anemia Thrombocytosis Iron studies on 7/16 suggest iron deficiency anemia, which could be related to his lung infection and chest tubes.  He was not started on iron supplement during this admission. -Consider supplement at follow-up if infections resolved.   EKG changes New findings QRS findings in lead 1/3 and t wave inversions in 3, could be secondary to severe pulmonary infection. Echo here showed a moderately enlarged right ventricle without elevation of right atrial pressure and an EF of 50 to 55% with global hypokinesis left ventricle. -Consider repeat echocardiogram when infection resolves.   Crohn's disease Patient will need follow-up with GI for his  immunomodulators   Tobacco use disorder -Encourage cessation.  Can offer nicotine patch if agreeable   Type 2 bipolar disorder He has been stable on Depakote and Lamictal prior to admission.  Patient was started on a short course of Xanax during this hospitalization but patient understands that he will not be prescribed benzodiazepine after discharge. -Patient will need to follow-up with a behavioral health provider at follow-up   2.  Labs / imaging needed at time of follow-up: NA   3.  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   Right sided complex empyema Multifocal pneumonia He was recently admitted at [facility] in [place] for right-sided empyema with cultures that grew strep intermedius.  He was discharged on 08/04/2021 after antibiotic treatment, chest tubes, and VATS decortication on Augmentin with a stop date of 09/04/2021. He returned to [facility] ED for right sided pleuritic chest pain and dyspnea. Imaging here show recurrence/persistence of the right sided empyema.  On  admission he was tachypneic and definitely uncomfortable but overall hemodynamically stable and concern for sepsis was overall low with lactic of 2->1.9. He was put on Vanc/Cefepime on 08/15/2021. PulmCrit and CT surgery were consulted.    Chest tube was placed on 8/9 and he had 3 rounds of lytic  therapy with over 1 L output from the chest tube before output decreased. Chest tube removed on 8/13.  Culture and pleural fluid culture remain negative to date.  Nasal swab MRSA was negative.  Patient was discharged with 3 more weeks of Augmentin and will follow-up with pulmonology for repeat CT chest in 6 weeks.   EKG Changes Prior Ekg from 2021, new findings QRS findings in lead 1/3 and t wave inversions in 3. Possible secondary to severe pulmonary infection.  Echo here showed a moderately enlarged right ventricle without elevation of right atrial pressure and an EF of 50 to 55% with global hypokinesis left ventricle.  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 We do recommend that there be a follow-up echocardiogram after discharge.   Transaminitis  Admitted within AST of 74 and ALT of 82.  Low concern for seeding of the liver but we monitored this and watch for any new right upper quadrant pain.  His transaminitis resolved and he did not show any signs of right upper quadrant pain that would have indicated [place] to get a right upper quadrant ultrasound during his admission.   Discharge Subjective: Patient was seen at bedside.  He appears comfortable in no acute distress.  He states that pain is reasonably controlled with oxycodone and Robaxin.  Patient is able to ambulate to the restroom by himself.  We discussed discharge plan with 3 more weeks of Augmentin and 7 days of oxycodone.  Patient has used Suboxone in the past and states that it was not helpful.  However he agrees to trying Suboxone again with the [facility].  Patient lives at home with his parents who can help him.        History of Present Illness  09/21/2021  Pulmonary/ 1st office eval/ Seth Foster / [facility]  Chief Complaint  Patient presents with    Hospitalization Follow-up    He is doing some better and has some chest tightness and feels his right lung is not working as well   Dyspnea:  since d/c walking more / more active / light yard work  Cough: not much since d/c Sleep: flat in bed/ 2 pillows  SABA use: none New area of cp x one week but improved on its own x 2 days  on naprosyn/tylenol but worse with deep breath and worse lying down   No obvious day to day or daytime pattern/variability or assoc excess/ purulent sputum or mucus plugs or hemoptysis or cp or chest tightness, subjective wheeze or overt sinus or hb symptoms.   *** without nocturnal  or early am exacerbation  of respiratory  c/o's or need for noct saba. Also denies any obvious fluctuation of symptoms with weather or environmental changes or other aggravating or alleviating factors except as outlined above   No unusual exposure hx or h/o childhood pna/ asthma or knowledge of premature birth.  Current Allergies, Complete Past Medical History, Past Surgical History, Family History, and Social History were reviewed in [employer]  record.  ROS  The following are not active complaints unless bolded Hoarseness, sore throat, dysphagia, dental problems, itching, sneezing,  nasal congestion or discharge of excess mucus or purulent secretions, ear ache,   fever, chills, sweats, unintended wt loss or wt gain, classically pleuritic or exertional cp,  orthopnea pnd or arm/hand swelling  or leg swelling, presyncope, palpitations, abdominal pain, anorexia, nausea, vomiting, diarrhea  or change in bowel habits or change in bladder habits, change in stools or change in urine, dysuria, hematuria,  rash, arthralgias, visual complaints, headache, numbness, weakness or ataxia or problems with walking or coordination,  change in mood or  memory.               Past Medical History:  Diagnosis Date   Asthma    Chronic pain syndrome    Crohn disease ([place])      Outpatient Medications Prior to Visit  Medication Sig Dispense Refill   acetaminophen (TYLENOL) 500 MG tablet Take 1,000 mg by mouth every 4 (four) hours as needed for moderate pain.     albuterol (PROVENTIL HFA;VENTOLIN HFA) 108 (90 BASE) MCG/ACT inhaler Inhale 2 puffs into the lungs every 6 (six) hours as needed for wheezing or shortness of breath. 1 Inhaler 1   divalproex (DEPAKOTE ER) 500 MG 24 hr tablet Take 1,000 mg by mouth every evening.     HUMIRA PEN 40 MG/0.4ML PNKT Inject 40 mg into the skin every 14 (fourteen) days.     ibuprofen (ADVIL) 200 MG tablet Take 600-800 mg by mouth every 4 (four) hours as needed for moderate pain.     lamoTRIgine (LAMICTAL) 200 MG tablet Take 200 mg by mouth every evening.     promethazine (PHENERGAN) 12.5 MG tablet Take 12.5 mg by mouth every 6 (six) hours as needed for nausea or vomiting.     No facility-administered medications prior to visit.     Objective:     BP 120/78 (BP Location: Left Arm, Cuff Size: Normal)   Pulse 61   Temp 98 F (36.7 C)   Ht '5\' 11"'$  (1.803 m)   Wt 146 lb 9.6 oz (66.5 kg)   SpO2 97% Comment: ra  BMI 20.45 kg/m   SpO2: 97 % (ra)  Pleasant thin amb wm nad  Wt Readings from Last 3 Encounters:  09/21/21 146 lb 9.6 oz (66.5 kg)  08/28/21 138 lb 4.8 oz (62.7 kg)  08/28/21 138 lb (62.6 kg)      HEENT : Oropharynx  clear/ multiple missing teetch  Nasal turbinates nl    NECK :  without  apparent JVD/ palpable Nodes/TM    LUNGS: no acc muscle use,  Min barrel  contour chest wall with diminished BS and dullness 1/3 R base with hypersensitive skin over R chest wall near the Chest tube scar and  CV:  RRR  no s3 or murmur or increase in P2, and no edema   ABD:  soft and nontender with pos end  insp Hoover's  in the supine position.  No bruits or organomegaly appreciated   MS:  Nl gait/ ext warm without deformities Or obvious joint restrictions  calf tenderness, cyanosis or clubbing     SKIN: warm and dry  without lesions    NEURO:  alert, approp, nl sensorium with  no motor or cerebellar deficits apparent.            Assessment   No problem-specific Assessment & Plan notes found for this encounter.  Christinia Gully, MD 09/21/2021

## 2021-09-21 NOTE — Patient Instructions (Signed)
The pain over your back would probably respond to zostrix cream applied four times a day.   Please remember to go to the lab department   for your tests - we will call you with the results when they are available.      Please remember to go to the  x-ray department  @  [facility] for your tests - we will call you with the results when they are available      Please schedule a follow up office visit in 4 weeks, sooner if needed

## 2021-09-22 ENCOUNTER — Telehealth: Payer: Self-pay | Admitting: Internal Medicine

## 2021-09-22 LAB — CBC WITH DIFFERENTIAL/PLATELET
Basophils Absolute: 0.1 10*3/uL (ref 0.0–0.2)
Basos: 1 %
EOS (ABSOLUTE): 0.7 10*3/uL — ABNORMAL HIGH (ref 0.0–0.4)
Eos: 6 %
Hematocrit: 39.2 % (ref 37.5–51.0)
Hemoglobin: 12.3 g/dL — ABNORMAL LOW (ref 13.0–17.7)
Immature Grans (Abs): 0.1 10*3/uL (ref 0.0–0.1)
Immature Granulocytes: 1 %
Lymphocytes Absolute: 2.3 10*3/uL (ref 0.7–3.1)
Lymphs: 22 %
MCH: 28.6 pg (ref 26.6–33.0)
MCHC: 31.4 g/dL — ABNORMAL LOW (ref 31.5–35.7)
MCV: 91 fL (ref 79–97)
Monocytes Absolute: 1 10*3/uL — ABNORMAL HIGH (ref 0.1–0.9)
Monocytes: 9 %
Neutrophils Absolute: 6.4 10*3/uL (ref 1.4–7.0)
Neutrophils: 61 %
Platelets: 544 10*3/uL — ABNORMAL HIGH (ref 150–450)
RBC: 4.3 x10E6/uL (ref 4.14–5.80)
RDW: 15 % (ref 11.6–15.4)
WBC: 10.5 10*3/uL (ref 3.4–10.8)

## 2021-09-22 LAB — SEDIMENTATION RATE: Sed Rate: 12 mm/hr (ref 0–15)

## 2021-09-22 LAB — BASIC METABOLIC PANEL
BUN/Creatinine Ratio: 21 — ABNORMAL HIGH (ref 9–20)
BUN: 14 mg/dL (ref 6–20)
CO2: 22 mmol/L (ref 20–29)
Calcium: 9.1 mg/dL (ref 8.7–10.2)
Chloride: 106 mmol/L (ref 96–106)
Creatinine, Ser: 0.67 mg/dL — ABNORMAL LOW (ref 0.76–1.27)
Glucose: 75 mg/dL (ref 70–99)
Potassium: 4.5 mmol/L (ref 3.5–5.2)
Sodium: 144 mmol/L (ref 134–144)
eGFR: 122 mL/min/{1.73_m2} (ref >59)

## 2021-09-22 NOTE — Telephone Encounter (Signed)
I called the patient with his results and he states he looked at the CXR and wants to know if he should be seen sooner than 1 month as he is concerned the issue has not resolved. Please advise?

## 2021-09-24 ENCOUNTER — Encounter: Payer: Self-pay | Admitting: Internal Medicine

## 2021-09-24 DIAGNOSIS — Z87891 Personal history of nicotine dependence: Secondary | ICD-10-CM | POA: Insufficient documentation

## 2021-09-24 DIAGNOSIS — G8922 Chronic post-thoracotomy pain: Secondary | ICD-10-CM | POA: Insufficient documentation

## 2021-09-24 NOTE — Assessment & Plan Note (Addendum)
Quit smoking smoking  2023> encouraged continued abstinence          Each maintenance medication was reviewed in detail including emphasizing most importantly the difference between maintenance and prns and under what circumstances the prns are to be triggered using an action plan format where appropriate.  Total time for H and P, chart review, counseling,  and generating customized AVS unique to this office visit / same day charting   40 min with complex  pt new to me

## 2021-09-24 NOTE — Assessment & Plan Note (Signed)
S/p VATS and muliple chest tubes/ lytics summer of 2023  - rec trial of Zostrix cream 09/21/2021 >>>

## 2021-09-24 NOTE — Telephone Encounter (Signed)
No need for cxr unlesss symptoms worse but if possible would hold off on additional immunosuppressives (humira) unless pressing need, in which case a CT with contrast might be considered at baseline and after several weeks of therapy.

## 2021-09-24 NOTE — Assessment & Plan Note (Signed)
Onset of symptoms was w/in weeks of L sided dental infections June 2023 -  pt wit Crohn's dz on adalimumab and [facility] [place] that grew strep intermedius.  He was discharged on 08/04/2021 after antibiotic treatment, chest tubes, and VATS decortication on Augmentin with a stop date of 09/04/2021.  - see readmit 8/823 for lytics - 09/21/2021 cxr stable/ ESR 12  Most likely what we're seeing now on cxr is just extensive parenchymal and pleural scarring and no new areas of inflammation or scarring.  It's hard to be sure about that and if possible would hold off on additional immunosuppressives unless pressing need, in which case a CT with contrast might be considered at baseline and after several weeks of therapy.

## 2021-09-25 NOTE — Telephone Encounter (Signed)
Called and went over response with patient and he voiced understanding but wants to be seen sooner than 10/13 appt. Dr. Melvyn Novas please advise can we add patient to schedule next week?

## 2021-09-25 NOTE — Telephone Encounter (Signed)
That's fine

## 2021-09-25 NOTE — Telephone Encounter (Signed)
Rescheduled pts appt. Nothing further needed

## 2021-09-27 NOTE — Unmapped (Signed)
Dakota Townsend has stated that the provider will be starting him on a loading dose of Humira in the next couple of weeks, it had not been scheduled yet. The patient would like a call back within next 3 months or he will give [place] back to follow up. I will reschedule refill call.

## 2021-10-03 ENCOUNTER — Encounter: Payer: Self-pay | Admitting: Internal Medicine

## 2021-10-03 ENCOUNTER — Ambulatory Visit (INDEPENDENT_AMBULATORY_CARE_PROVIDER_SITE_OTHER): Payer: Medicare Other | Admitting: Internal Medicine

## 2021-10-03 ENCOUNTER — Ambulatory Visit (HOSPITAL_COMMUNITY)
Admission: RE | Admit: 2021-10-03 | Discharge: 2021-10-03 | Disposition: A | Discharge status: Home / Self Care | Payer: Medicare Other | Source: Ambulatory Visit | Attending: Internal Medicine | Admitting: Internal Medicine

## 2021-10-03 DIAGNOSIS — J869 Pyothorax without fistula: Secondary | ICD-10-CM | POA: Diagnosis not present

## 2021-10-03 DIAGNOSIS — J90 Pleural effusion, not elsewhere classified: Secondary | ICD-10-CM | POA: Diagnosis not present

## 2021-10-03 DIAGNOSIS — R0602 Shortness of breath: Secondary | ICD-10-CM | POA: Diagnosis not present

## 2021-10-03 NOTE — Patient Instructions (Signed)
Please remember to go to the  x-ray department  for your tests - we will call you with the results when they are available    Please schedule a follow up office visit in 4 -6 weeks, sooner if needed - new or worse pain breathing, shortness of breath, fever or chills

## 2021-10-03 NOTE — Progress Notes (Signed)
Seth Foster, male    DOB: 1980-12-14    MRN: [id number]   Brief patient profile:  78  yowm  quit smoking summer 2023  referred to pulmonary clinic in [place]  09/21/2021 by Dr Windell Norfolk  for post hops f/u for empyema     Date of Admission: 08/15/2021    Date of Discharge: No discharge date for patient encounter. Attending Physician: Lucious Groves, DO   Discharge Diagnosis: Empyema of the right pleural space Crohn's disease of intestine Normocytic anemia Opioid use disorder Type II bipolar Tobacco use disorder   Rt-sided complex empyema Chest tube placed 8/9 and removed 8/13.  Blood culture and pleural fluid were negative to date.  MRSA nasal swab was also negative. -Patient received 7 days of vancomycin/cefepime and transition to 3 more weeks of Augmentin after discharge. -Patient will need a repeat CT scan in 6 weeks and follow-up with pulmonology at [facility].   Opioid use disorder Patient has a history of OUD with IV opiates use in the past, reported last over 5 years ago.  Patient has used Suboxone in the past but he thought it was not helpful.  Patient is agreeable with trying Suboxone again with [facility].  -He was prescribed 7 days of oxycodone after discharge, with Robaxin and naproxen.   Normocytic anemia Thrombocytosis Iron studies on 7/16 suggest iron deficiency anemia, which could be related to his lung infection and chest tubes.  He was not started on iron supplement during this admission. -Consider supplement at follow-up if infections resolved.   EKG changes New findings QRS findings in lead 1/3 and t wave inversions in 3, could be secondary to severe pulmonary infection. Echo here showed a moderately enlarged right ventricle without elevation of right atrial pressure and an EF of 50 to 55% with global hypokinesis left ventricle. -Consider repeat echocardiogram when infection resolves.   Crohn's disease Patient will need follow-up with GI for his  immunomodulators   Tobacco use disorder -Encourage cessation.  Can offer nicotine patch if agreeable   Type 2 bipolar disorder He has been stable on Depakote and Lamictal prior to admission.  Patient was started on a short course of Xanax during this hospitalization but patient understands that he will not be prescribed benzodiazepine after discharge. -Patient will need to follow-up with a behavioral health provider at follow-up   2.  Labs / imaging needed at time of follow-up: NA   3.  Pending labs/ test needing follow-up: CBC, BMP   Follow-up Appointments: -[facility] 08/28/21, 2:45 PM with Dr. 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   Right sided complex empyema Multifocal pneumonia He was recently admitted at [facility] in [place] for right-sided empyema with cultures that grew strep intermedius.  He was discharged on 08/04/2021 after antibiotic treatment, chest tubes, and VATS decortication on Augmentin with a stop date of 09/04/2021. He returned to [facility] ED for right sided pleuritic chest pain and dyspnea. Imaging here show recurrence/persistence of the right sided empyema.  On  admission he was tachypneic and definitely uncomfortable but overall hemodynamically stable and concern for sepsis was overall low with lactic of 2->1.9. He was put on Vanc/Cefepime on 08/15/2021. PulmCrit and CT surgery were consulted.    Chest tube was placed on 8/9 and he had 3 rounds of lytic  therapy with over 1 L output from the chest tube before output decreased. Chest tube removed on 8/13.  Culture and pleural fluid culture remain negative to date.  Nasal swab MRSA was negative.  Patient was discharged with 3 more weeks of Augmentin and will follow-up with pulmonology for repeat CT chest in 6 weeks.   EKG Changes Prior Ekg from 2021, new findings QRS findings in lead 1/3 and t wave inversions in 3. Possible secondary to severe pulmonary infection.  Echo here showed a moderately enlarged right ventricle without elevation of right atrial pressure and an EF of 50 to 55% with global hypokinesis left ventricle.  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 We do recommend that there be a follow-up echocardiogram after discharge.   Transaminitis  Admitted within AST of 74 and ALT of 82.  Low concern for seeding of the liver but we monitored this and watch for any new right upper quadrant pain.  His transaminitis resolved and he did not show any signs of right upper quadrant pain that would have indicated [place] to get a right upper quadrant ultrasound during his admission.   Discharge Subjective: Patient was seen at bedside.  He appears comfortable in no acute distress.  He states that pain is reasonably controlled with oxycodone and Robaxin.  Patient is able to ambulate to the restroom by himself.  We discussed discharge plan with 3 more weeks of Augmentin and 7 days of oxycodone.  Patient has used Suboxone in the past and states that it was not helpful.  However he agrees to trying Suboxone again with the [facility].  Patient lives at home with his parents who can help him.        History of Present Illness  09/21/2021  Pulmonary/ 1st office eval/ Seth Foster / [place] [facility]  Chief Complaint  Patient presents with    Hospitalization Follow-up    He is doing some better and has some chest tightness and feels his right lung is not working as well   Dyspnea:  since d/c walking more / more active / light yard work  Cough: not much since d/c Sleep: flat in bed/ 2 pillows  SABA use: none New area of cp x one week but improved on its own x 2 days  on naprosyn/tylenol but worse with deep breath and worse lying down  Rec The pain over your back would probably respond to zostrix cream applied four times a day.     10/03/2021  f/u ov/[place] office/Seth Foster re: s/p empyema   Chief Complaint  Patient presents with   Follow-up    Patient req. Sooner follow up to discuss a plan moving forward from last visit    Dyspnea:  not very active / walking some around neighborhood  Cough: none  Sleeping: flat bed/ 2 pillows /positions SABA use: none  02: none  Covid status: none/ never infected      No obvious day to day or daytime variability or assoc excess/ purulent sputum or mucus  plugs or hemoptysis or cp or chest tightness, subjective wheeze or overt sinus or hb symptoms.   Sleeping as above without nocturnal  or early am exacerbation  of respiratory  c/o's or need for noct saba. Also denies any obvious fluctuation of symptoms with weather or environmental changes or other aggravating or alleviating factors except as outlined above   No unusual exposure hx or h/o childhood pna/ asthma or knowledge of premature birth.  Current Allergies, Complete Past Medical History, Past Surgical History, Family History, and Social History were reviewed in [employer] record.  ROS  The following are not active complaints unless bolded Hoarseness, sore throat, dysphagia, dental problems, itching, sneezing,  nasal congestion or discharge of excess mucus or purulent secretions, ear ache,   fever, chills, sweats, unintended wt loss or wt gain, classically pleuritic or exertional cp,  orthopnea pnd or arm/hand  swelling  or leg swelling, presyncope, palpitations, abdominal pain, anorexia, nausea, vomiting, diarrhea  or change in bowel habits or change in bladder habits, change in stools or change in urine, dysuria, hematuria,  rash, arthralgias, visual complaints, headache, numbness, weakness or ataxia or problems with walking or coordination,  change in mood or  memory.        Current Meds  Medication Sig   acetaminophen (TYLENOL) 500 MG tablet Take 1,000 mg by mouth every 4 (four) hours as needed for moderate pain.   albuterol (PROVENTIL HFA;VENTOLIN HFA) 108 (90 BASE) MCG/ACT inhaler Inhale 2 puffs into the lungs every 6 (six) hours as needed for wheezing or shortness of breath.   divalproex (DEPAKOTE ER) 500 MG 24 hr tablet Take 1,000 mg by mouth every evening.   HUMIRA PEN 40 MG/0.4ML PNKT Inject 40 mg into the skin every 14 (fourteen) days.   ibuprofen (ADVIL) 200 MG tablet Take 600-800 mg by mouth every 4 (four) hours as needed for moderate pain.   lamoTRIgine (LAMICTAL) 200 MG tablet Take 200 mg by mouth every evening.   promethazine (PHENERGAN) 12.5 MG tablet Take 12.5 mg by mouth every 6 (six) hours as needed for nausea or vomiting.           .               Past Medical History:  Diagnosis Date   Asthma    Chronic pain syndrome    Crohn disease ([place])        Objective:      wts  10/03/2021       143    09/21/21 146 lb 9.6 oz (66.5 kg)  08/28/21 138 lb 4.8 oz (62.7 kg)  08/28/21 138 lb (62.6 kg)    Vital signs reviewed  10/03/2021  - Note at rest 02 sats  98% on RA   General appearance:     Amb wm nad     HEENT : Oropharynx  clear     Nasal turbinates nl   NECK :  without  apparent JVD/ palpable Nodes/TM    LUNGS: no acc muscle use,  Nl contour chest which is clear to A and P bilaterally without cough on insp or exp maneuvers   CV:  RRR  no s3 or murmur or increase in P2, and no edema   ABD:  soft and nontender with nl inspiratory excursion in the supine  position. No bruits or organomegaly appreciated   MS:  Nl gait/ ext warm without deformities Or obvious joint restrictions  calf tenderness, cyanosis or clubbing  SKIN: warm and dry without lesions - wounds look good from chest tubes   NEURO:  alert, approp, nl sensorium with  no motor or cerebellar deficits apparent.     CXR PA and Lateral:   10/03/2021 :    I personally reviewed images and agree with radiology impression as follows:     Similar-appearing right effusion with posterior cavitary component. My review:  "no cavity" on lateral view so likely artifact or pleural based      Assessment

## 2021-10-06 ENCOUNTER — Encounter: Payer: Self-pay | Admitting: Internal Medicine

## 2021-10-06 NOTE — Assessment & Plan Note (Signed)
Onset of symptoms was w/in weeks of L sided dental infections June 2023 -  pt wit Crohn's dz on adalimumab and 48m [facility] [place] cultures that grew strep intermedius.  He was discharged on 08/04/2021 after antibiotic treatment, chest tubes, and VATS decortication on Augmentin with a stop date of 09/04/2021.  - see readmit 8/823 for lytics - 09/21/2021 cxr stable/ ESR 12 > no change cxr 10/03/2021   Healing well though cxr lag is expected and not of concern given how well he's doing clinically.  Whether it is safe to resume immunosuppression is another issue as there is a risk of   "pleural sepsis " if there are an pockets of infection still present somewhere in the pleural space and the risk/benefit needs to be considered in this setting ? Perhaps repeat CT with contrast  First ?   F/u either way in 4-6 weeks - call sooner if needed         Each maintenance medication was reviewed in detail including emphasizing most importantly the difference between maintenance and prns and under what circumstances the prns are to be triggered using an action plan format where appropriate.  Total time for H and P, chart review, counseling,  and generating customized AVS unique to this office visit / same day charting = 26 min

## 2021-10-11 DIAGNOSIS — K5090 Crohn's disease, unspecified, without complications: Secondary | ICD-10-CM | POA: Diagnosis not present

## 2021-10-11 DIAGNOSIS — L259 Unspecified contact dermatitis, unspecified cause: Secondary | ICD-10-CM | POA: Diagnosis not present

## 2021-10-11 DIAGNOSIS — R52 Pain, unspecified: Secondary | ICD-10-CM | POA: Diagnosis not present

## 2021-10-11 DIAGNOSIS — R059 Cough, unspecified: Secondary | ICD-10-CM | POA: Diagnosis not present

## 2021-10-11 DIAGNOSIS — L299 Pruritus, unspecified: Secondary | ICD-10-CM | POA: Diagnosis not present

## 2021-10-20 ENCOUNTER — Ambulatory Visit: Payer: Medicare Other | Admitting: Internal Medicine

## 2021-11-11 MED ORDER — MERCAPTOPURINE 50 MG TABLET
ORAL_TABLET | 0 refills | 0 days
Start: 2021-11-11 — End: ?

## 2021-11-14 ENCOUNTER — Ambulatory Visit (INDEPENDENT_AMBULATORY_CARE_PROVIDER_SITE_OTHER): Payer: Medicare Other | Admitting: Internal Medicine

## 2021-11-14 ENCOUNTER — Ambulatory Visit (HOSPITAL_COMMUNITY)
Admission: RE | Admit: 2021-11-14 | Discharge: 2021-11-14 | Disposition: A | Discharge status: Home / Self Care | Payer: Medicare Other | Source: Ambulatory Visit | Attending: Internal Medicine | Admitting: Internal Medicine

## 2021-11-14 ENCOUNTER — Encounter: Payer: Self-pay | Admitting: Internal Medicine

## 2021-11-14 VITALS — BP 130/76 | HR 94 | Temp 98.4°F | Ht 71.0 in | Wt 152.6 lb

## 2021-11-14 DIAGNOSIS — J869 Pyothorax without fistula: Secondary | ICD-10-CM | POA: Insufficient documentation

## 2021-11-14 DIAGNOSIS — R918 Other nonspecific abnormal finding of lung field: Secondary | ICD-10-CM | POA: Diagnosis not present

## 2021-11-14 DIAGNOSIS — J9811 Atelectasis: Secondary | ICD-10-CM | POA: Diagnosis not present

## 2021-11-14 DIAGNOSIS — J90 Pleural effusion, not elsewhere classified: Secondary | ICD-10-CM | POA: Diagnosis not present

## 2021-11-14 MED ORDER — MELOXICAM 7.5 MG PO TABS: 7.5000 mg | ORAL_TABLET | Freq: Every day | ORAL | 0 refills | Status: AC | PRN

## 2021-11-14 NOTE — Patient Instructions (Addendum)
Ok to return to work full time without restrictions   Meloxicam 7.5 mg with meals daily as needed for chest pains   Zosxtric cream 4 x  daily x 2 weeks then stop if not convinced its helping   Please remember to go to the  x-ray department  @  [facility] for your tests - we will call you with the results when they are available      Please schedule a follow up visit in 3 months but call sooner if needed

## 2021-11-14 NOTE — Progress Notes (Unsigned)
Seth Foster, male    DOB: [birth year]    MRN: [id number]   Brief patient profile:  35  yowm  quit smoking summer 2023  referred to pulmonary clinic in [place]  09/21/2021 by Dr Windell Norfolk  for post hosp f/u for empyema related to dental surgery complications     Date of Admission: 08/15/2021    Date of Discharge: No discharge date for patient encounter. Attending Physician: Lucious Groves, DO   Discharge Diagnosis: Empyema of the right pleural space Crohn's disease of intestine Normocytic anemia Opioid use disorder Type II bipolar Tobacco use disorder   Rt-sided complex empyema Chest tube placed 8/9 and removed 8/13.  Blood culture and pleural fluid were negative to date.  MRSA nasal swab was also negative. -Patient received 7 days of vancomycin/cefepime and transition to 3 more weeks of Augmentin after discharge. -Patient will need a repeat CT scan in 6 weeks and follow-up with pulmonology at [facility].   Opioid use disorder Patient has a history of OUD with IV opiates use in the past, reported last over 5 years ago.  Patient has used Suboxone in the past but he thought it was not helpful.  Patient is agreeable with trying Suboxone again with [facility].  -He was prescribed 7 days of oxycodone after discharge, with Robaxin and naproxen.   Normocytic anemia Thrombocytosis Iron studies on 7/16 suggest iron deficiency anemia, which could be related to his lung infection and chest tubes.  He was not started on iron supplement during this admission. -Consider supplement at follow-up if infections resolved.   EKG changes New findings QRS findings in lead 1/3 and t wave inversions in 3, could be secondary to severe pulmonary infection. Echo here showed a moderately enlarged right ventricle without elevation of right atrial pressure and an EF of 50 to 55% with global hypokinesis left ventricle. -Consider repeat echocardiogram when infection resolves.   Crohn's disease Patient will need  follow-up with GI for his immunomodulators   Tobacco use disorder -Encourage cessation.  Can offer nicotine patch if agreeable   Type 2 bipolar disorder He has been stable on Depakote and Lamictal prior to admission.  Patient was started on a short course of Xanax during this hospitalization but patient understands that he will not be prescribed benzodiazepine after discharge. -Patient will need to follow-up with a behavioral health provider at follow-up   2.  Labs / imaging needed at time of follow-up: NA   3.  Pending labs/ test needing follow-up: CBC, BMP   Follow-up Appointments: -[facility] 08/28/21, 2:45 PM with Dr. 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   Right sided complex empyema Multifocal pneumonia He was recently admitted at [facility] in [place] for right-sided empyema with cultures that grew strep intermedius.  He was discharged on 08/04/2021 after antibiotic treatment, chest tubes, and VATS decortication on Augmentin with a stop date of 09/04/2021. He returned to [facility] ED for right sided pleuritic chest pain and dyspnea. Imaging here show recurrence/persistence of the  right sided empyema.  On admission he was tachypneic and definitely uncomfortable but overall hemodynamically stable and concern for sepsis was overall low with lactic of 2->1.9. He was put on Vanc/Cefepime on 08/15/2021. PulmCrit and CT surgery were consulted.    Chest tube was placed on 8/9 and he  had 3 rounds of lytic therapy with over 1 L output from the chest tube before output decreased. Chest tube removed on 8/13.  Culture and pleural fluid culture remain negative to date.  Nasal swab MRSA was negative.  Patient was discharged with 3 more weeks of Augmentin and will follow-up with pulmonology for repeat CT chest in 6 weeks.   EKG Changes Prior Ekg from 2021, new findings QRS findings in lead 1/3 and t wave inversions in 3. Possible secondary to severe pulmonary infection.  Echo here showed a moderately enlarged right ventricle without elevation of right atrial pressure and an EF of 50 to 55% with global hypokinesis left ventricle.  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 We do recommend that there be a follow-up echocardiogram after discharge.   Transaminitis  Admitted within AST of 74 and ALT of 82.  Low concern for seeding of the liver but we monitored this and watch for any new right upper quadrant pain.  His transaminitis resolved and he did not show any signs of right upper quadrant pain that would have indicated [place] to get a right upper quadrant ultrasound during his admission.   Discharge Subjective: Patient was seen at bedside.  He appears comfortable in no acute distress.  He states that pain is reasonably controlled with oxycodone and Robaxin.  Patient is able to ambulate to the restroom by himself.  We discussed discharge plan with 3 more weeks of Augmentin and 7 days of oxycodone.  Patient has used Suboxone in the past and states that it was not helpful.  However he agrees to trying Suboxone again with the [facility].  Patient lives at home with his parents who can help him.        History of Present Illness  09/21/2021  Pulmonary/ 1st Foster eval/ Seth Foster  Chief Complaint  Patient presents  with   Hospitalization Follow-up    He is doing some better and has some chest tightness and feels his right lung is not working as well   Dyspnea:  since d/c walking more / more active / light yard work  Cough: not much since d/c Sleep: flat in bed/ 2 pillows  SABA use: none New area of cp x one week but improved on its own x 2 days  on naprosyn/tylenol but worse with deep breath and worse lying down  Rec The pain over your back would probably respond to zostrix cream applied four times a day.     10/03/2021  f/u ov/Seth Foster/Seth Foster: s/p empyema   Chief Complaint  Patient presents with   Follow-up    Patient req. Sooner follow up to discuss a plan moving forward from last visit    Dyspnea:  not very active / walking some around neighborhood  Cough: none  Sleeping: flat bed/ 2 pillows /positions SABA use: none  02: none  Covid status: none/ never infected  Rec No change rx   11/14/2021  f/u ov/[place] Foster/Seth Foster Foster: empyema  maint on no resp rx / not back on humira yet  Chief Complaint  Patient presents with   Follow-up    Having some pain and wants to know if that will belong term Would like work note clearing him to go back to work    Dyspnea:  walkathon x 20 miles/ some wt lifting  Cough: minimal yellowish  Sleeping: level/ positional pain when sleeping  SABA use: none  02: none  Covid status: none/ never  R ant cw pain/ numbness in same distribution as dermatome where chest tube placed, did not give zostrix much of a try and instead now taking meloxicam 7.5 daily per PCP and wants refills  Has mild ant /lat R cp only with deep breath/ R should pains positonal. Loss of appetite/ abd pain starting back up as in past rx'd with humira still on hold      No obvious day to day or daytime variability or assoc excess/ purulent sputum or mucus plugs or hemoptysis or subjective wheeze or overt sinus or hb symptoms.   Sleeping  without nocturnal  or early am  exacerbation  of respiratory  c/o's or need for noct saba. Also denies any obvious fluctuation of symptoms with weather or environmental changes or other aggravating or alleviating factors except as outlined above   No unusual exposure hx or h/o childhood pna/ asthma or knowledge of premature birth.  Current Allergies, Complete Past Medical History, Past Surgical History, Family History, and Social History were reviewed in [employer] record.  ROS  The following are not active complaints unless bolded Hoarseness, sore throat, dysphagia, dental problems, itching, sneezing,  nasal congestion or discharge of excess mucus or purulent secretions, ear ache,   fever, chills, sweats, unintended wt loss or wt gain, classically pleuritic or exertional cp,  orthopnea pnd or arm/hand swelling  or leg swelling, presyncope, palpitations, abdominal pain, anorexia, nausea, vomiting, diarrhea  or change in bowel habits or change in bladder habits, change in stools or change in urine, dysuria, hematuria,  rash, arthralgias, visual complaints, headache, numbness, weakness or ataxia or problems with walking or coordination,  change in mood or  memory.        Current Meds  Medication Sig   acetaminophen (TYLENOL) 500 MG tablet Take 1,000 mg by mouth every 4 (four) hours as needed for moderate pain.   albuterol (PROVENTIL HFA;VENTOLIN HFA) 108 (90 BASE) MCG/ACT inhaler Inhale 2 puffs into the lungs every 6 (six) hours as needed for wheezing or shortness of breath.   ALPRAZolam (XANAX) 0.25 MG tablet Take 0.25 mg by mouth daily.   Buprenorphine HCl-Naloxone HCl (SUBOXONE) 8-2 MG FILM Place under the tongue.   divalproex (DEPAKOTE ER) 500 MG 24 hr tablet Take 1,000 mg by mouth every evening.   HUMIRA PEN 40 MG/0.4ML PNKT Inject 40 mg into the skin every 14 (fourteen) days.   ibuprofen (ADVIL) 200 MG tablet Take 600-800 mg by mouth every 4 (four) hours as needed for moderate pain.   lamoTRIgine  (LAMICTAL) 200 MG tablet Take 200 mg by mouth every evening.   loperamide (IMODIUM) 2 MG capsule Take by mouth.   promethazine (PHENERGAN) 12.5 MG tablet Take 12.5 mg by mouth every 6 (six) hours as needed for nausea or vomiting.   [DISCONTINUED] meloxicam (MOBIC) 7.5 MG tablet Take 7.5 mg by mouth daily as needed.                Marland Kitchen  Past Medical History:  Diagnosis Date   Asthma    Chronic pain syndrome    Crohn disease ([place])        Objective:      wts  11/14/2021        152  10/03/2021       143    09/21/21 146 lb 9.6 oz (66.5 kg)  08/28/21 138 lb 4.8 oz (62.7 kg)  08/28/21 138 lb (62.6 kg)    Vital signs reviewed  11/14/2021  - Note at rest 02 sats  100% on RA   General appearance:    amb wm nad   HEENT : Oropharynx  ***     Nasal turbinates ***   NECK :  without  apparent JVD/ palpable Nodes/TM    LUNGS: no acc muscle use,  Nl contour chest which is clear to A and P bilaterally without cough on insp or exp maneuvers   CV:  RRR  no s3 or murmur or increase in P2, and no edema   ABD:  soft and nontender with nl inspiratory excursion in the supine position. No bruits or organomegaly appreciated   MS:  Nl gait/ ext warm without deformities Or obvious joint restrictions  calf tenderness, cyanosis or clubbing    SKIN: warm and dry without lesions    NEURO:  alert, approp, nl sensorium with  no motor or cerebellar deficits apparent.        Assessment

## 2021-11-15 ENCOUNTER — Encounter: Payer: Self-pay | Admitting: Internal Medicine

## 2021-11-15 NOTE — Assessment & Plan Note (Addendum)
Onset of symptoms was w/in weeks of L sided dental infections June 2023 -  pt wit Crohn's dz on adalimumab and 39m - [facility] [place] cultures that grew strep intermedius.  He was discharged on 08/04/2021 after antibiotic treatment, chest tubes, and VATS decortication on Augmentin with a stop date of 09/04/2021.  - see readmit 8/823 for lytics - 09/21/2021 cxr stable/ ESR 12 > no change cxr 10/03/2021   - 11/14/2021 marked improvement on cxr > f/u 3 m rec  plus zostrix/ meloxicam prn for residual aches/ paresthesias  R chest  wall   see avs for instructions unique to this ov          Each maintenance medication was reviewed in detail including emphasizing most importantly the difference between maintenance and prns and under what circumstances the prns are to be triggered using an action plan format where appropriate.  Total time for H and P, chart review, counseling,  and generating customized AVS unique to this office visit / same day charting = 25 min

## 2021-11-21 DIAGNOSIS — K50912 Crohn's disease, unspecified, with intestinal obstruction: Principal | ICD-10-CM

## 2021-11-21 MED ORDER — MERCAPTOPURINE 50 MG TABLET
ORAL_TABLET | 0 refills | 0 days | Status: CP
Start: 2021-11-21 — End: ?

## 2021-11-22 DIAGNOSIS — K50912 Crohn's disease, unspecified, with intestinal obstruction: Principal | ICD-10-CM

## 2021-11-22 MED ORDER — HUMIRA PEN CITRATE FREE 40 MG/0.4 ML
SUBCUTANEOUS | 1 refills | 84 days | Status: CP
Start: 2021-11-22 — End: ?
  Filled 2021-12-05: qty 3, 28d supply, fill #0

## 2021-11-22 MED ORDER — HUMIRA PEN CITRATE FREE STARTER PACK FOR CROHN'S/UC/HS 3 X 80 MG/0.8 ML
PACK | 0 refills | 0 days | Status: CP
Start: 2021-11-22 — End: ?

## 2021-11-22 NOTE — Unmapped (Signed)
Mychart messaging pasted below  ---------------------------------------------    Ok. Please restart 6mp 75mg  by mouth once daily.  I sent in new rx to your [facility] pharmacy.  Please go to any [facility] 2 weeks after starting to get bloodwork checked. I sent in orders to [facility] for this.    ===View-only below this line===      ----- Message -----       From:Dakota Townsend       Sent:11/20/2021  7:54 PM EST         UJ:[id number] Medical Advice Request Message List    Subject:Starting back on humira    I have not yet started on the 6mp yet. I think I have some labs as well I need done for you. I'll call to make an appointment.      ----- Message -----       From:Dakota Bither Skeet Latch, MD       Sent:11/20/2021  2:21 PM EST         WG:[id number] Shon Townsend    Subject:Starting back on humira    I will have Jacki Cones work on getting you restarted on the Humira.  She will be in touch with you.  Have you restarted the 6-MP already?    Please call [phone] to schedule a follow-up clinic visit with me sometime in the next 2 months.      ----- Message -----       From:Dakota Townsend       Sent:11/16/2021 11:29 AM EST         XB:[id number] Skeet Latch, MD    Subject:Starting back on humira    Sorry for not getting intouch sooner but pulminary has approved me getting back on my humira injections. Thank you for the lopermide, it has definitely helped tho some days are worse. On average I'll have three bowl movement a day. I think you mentioned that I would have to come in for clinic visit before I could start back on the humira if I could get that set up. I've got another refill lift on the lopermide which hopefully will see me thru till I can see you but if not, I may ask for another refill if needed.

## 2021-11-22 NOTE — Unmapped (Signed)
Per Dr. Stevphen Rochester, need to restart humira now with loading and maintenance dosing.  Scripts sent to [facility] for processing and fill.

## 2021-11-25 NOTE — Unmapped (Signed)
[facility] Specialty Medication Onboarding    Specialty Medication: HUMIRA(CF) PEN 40 mg/0.4 mL injection (adalimumab)  Prior Authorization: Not Required   Financial Assistance: No - copay  <$25  Final Copay/Day Supply: $0 / 28 (LD)          $0 / 28 (MD)    Insurance Restrictions: Yes - max 1 month supply     Notes to Pharmacist: n/a    The triage team has completed the benefits investigation and has determined that the patient is able to fill this medication at [facility]. Please contact the patient to complete the onboarding or follow up with the prescribing physician as needed.

## 2021-11-27 NOTE — Unmapped (Unsigned)
***November 27, 2021 at 9:52 AM: Incomplete onboarding, left voicemail for patient to callback - everything prepopulated - JS      [facility]   Patient Onboarding/Medication Counseling    Dakota Townsend is a 41 y.o. male with Crohn's who I am counseling today on initiation of therapy.  I am speaking to {Blank:19197::the patient,the patient's caregiver, ***,the patient's family member, ***,***}.    Was a Nurse, learning disability used for this call? No    Verified patient's date of birth / HIPAA.    Specialty medication(s) to be sent: Inflammatory Disorders: Humira      Non-specialty medications/supplies to be sent: ***sharps?      Medications not needed at this time: n/a         Humira (adalimumab)    The patient declined counseling on {sscdeclinecounseling:66033} because they have taken the medication previously. The information in the declined sections below are for informational purposes only and was not discussed with patient.     Medication & Administration     Dosage: Crohn's Disease: Inject 160mg  under the skin on day 1, 80mg  on day 15, then 40mg  every 14 days starting on day 29    Lab tests required prior to treatment initiation:  Tuberculosis:  Tuberculosis screening initially tested positive but 2 additional test conducted by the health dept were negative for TB (see documentation in media tab from 10/18). GI provider is aware and wants to initiate treatment at this time. .  Hepatitis B: Hepatitis B serology studies are complete and non-reactive.(Completed: 09/08/2020)    Administration:     Prefilled auto-injector pen  1. Gather all supplies needed for injection on a clean, flat working surface: medication pen removed from packaging, alcohol swab, sharps container, etc.  2. Look at the medication label - look for correct medication, correct dose, and check the expiration date  3. Look at the medication - the liquid visible in the window on the side of the pen device should appear clear and colorless  4. Lay the auto-injector pen on a flat surface and allow it to warm up to room temperature for at least 30-45 minutes  5. Select injection site - you can use the front of your thigh or your belly (but not the area 2 inches around your belly button); if someone else is giving you the injection you can also use your upper arm in the skin covering your triceps muscle  6. Prepare injection site - wash your hands and clean the skin at the injection site with an alcohol swab and let it air dry, do not touch the injection site again before the injection  7. Pull the 2 safety caps straight off - gray/white to uncover the needle cover and the plum cap to uncover the plum activator button, do not remove until immediately prior to injection and do not touch the white needle cover  8. Gently squeeze the area of cleaned skin and hold it firmly to create a firm surface at the selected injection site  9. Put the white needle cover against your skin at the injection site at a 90 degree angle, hold the pen such that you can see the clear medication window  10. Press down and hold the pen firmly against your skin, press the plum activator button to initiate the injection, there will be a click when the injection starts  11. Continue to hold the pen firmly against your skin for about 10-15 seconds - the window will start to  turn solid yellow  12. To verify the injection is complete after 10-15 seconds, look and ensure the window is solid yellow and then pull the pen away from your skin  13. Dispose of the used auto-injector pen immediately in your sharps disposal container the needle will be covered automatically  14. If you see any blood at the injection site, press a cotton ball or gauze on the site and maintain pressure until the bleeding stops, do not rub the injection site    Adherence/Missed dose instructions:  If your injection is given more than 3 days after your scheduled injection date - consult your pharmacist for additional instructions on how to adjust your dosing schedule.    Goals of Therapy     - Achieve remission of symptoms  - Maintain remission of symptoms  - Minimize long-term systemic glucocorticoid use  - Prevent need for surgical procedures  - Maintenance of effective psychosocial functioning    Side Effects & Monitoring Parameters     Injection site reaction (redness, irritation, inflammation localized to the site of administration)  Signs of a common cold - minor sore throat, runny or stuffy nose, etc.  Upset stomach  Headache    The following side effects should be reported to the provider:  Signs of a hypersensitivity reaction - rash; hives; itching; red, swollen, blistered, or peeling skin; wheezing; tightness in the chest or throat; difficulty breathing, swallowing, or talking; swelling of the mouth, face, lips, tongue, or throat; etc.  Reduced immune function - report signs of infection such as fever; chills; body aches; very bad sore throat; ear or sinus pain; cough; more sputum or change in color of sputum; pain with passing urine; wound that will not heal, etc.  Also at a slightly higher risk of some malignancies (mainly skin and blood cancers) due to this reduced immune function.  In the case of signs of infection - the patient should hold the next dose of Humira?? and call your primary care provider to ensure adequate medical care.  Treatment may be resumed when infection is treated and patient is asymptomatic.  Changes in skin - a new growth or lump that forms; changes in shape, size, or color of a previous mole or marking  Signs of unexplained bruising or bleeding - throwing up blood or emesis that looks like coffee grounds; black, tarry, or bloody stool; etc.  Signs of new or worsening heart failure - shortness of breath; sudden weight gain; heartbeat that is not normal; swelling in the arms or legs that is new or worse      Contraindications, Warnings, & Precautions     Have your bloodwork checked as you have been told by your prescriber  Talk with your doctor if you are pregnant, planning to become pregnant, or breastfeeding  Discuss the possible need for holding your dose(s) of Humira?? when a planned procedure is scheduled with the prescriber as it may delay healing/recovery timeline       Drug/Food Interactions     Medication list reviewed in Epic. The patient was instructed to inform the care team before taking any new medications or supplements. {Blank single:19197::No drug interactions identified,***}.   Talk with you prescriber or pharmacist before receiving any live vaccinations while taking this medication and after you stop taking it    Storage, Handling Precautions, & Disposal     Store this medication in the refrigerator.  Do not freeze  If needed, you may store at room temperature for up  to 14 days  Store in [employer], protected from light  Do not shake  Dispose of used syringes/pens in a sharps disposal container          Current Medications (including OTC/herbals), Comorbidities and Allergies     Current Outpatient Medications   Medication Sig Dispense Refill    acetaminophen (TYLENOL) 500 MG tablet Take 2 tablets (1,000 mg total) by mouth every four (4) hours as needed.      albuterol HFA 90 mcg/actuation inhaler Inhale 2 puffs every four (4) hours as needed. (Patient not taking: Reported on 09/19/2021)      ALPRAZolam (XANAX) 0.25 MG tablet Take 1 tablet (0.25 mg total) by mouth daily as needed.      buprenorphine-naloxone (SUBOXONE) 8-2 mg sublingual film TAKE ONE FILM DAILY      chlorhexidine (PERIDEX) 0.12 % solution RINSE WITH 15 ML FOR 30 SECONDS TWO TIMES DAILY IN AM AND PM (SPIT AFTER USE)--USE ONLY TWO WEEKS      cyanocobalamin, vitamin B-12, 1000 MCG tablet Take 1 tablet (1,000 mcg total) by mouth daily. Taking      divalproex ER (DEPAKOTE ER) 500 MG extended released 24 hr tablet TAKE 2 TABLETS BY MOUTH WITH SUPPER DAILY      empty container Misc Use as directed 1 each 3    ferrous sulfate 325 (65 FE) MG tablet Take 1 tablet (325 mg total) by mouth daily.      folic acid (FOLVITE) 1 MG tablet Take 1 tablet (1 mg total) by mouth daily. 30 tablet 11    HUMIRA PEN CITRATE FREE 40 MG/0.4 ML Inject the contents of 1 pen (40 mg total) under the skin every fourteen (14) days. Maintenance dose. 6 each 1    HUMIRA PEN CITRATE FREE STARTER PACK FOR CROHN'S/UC/HS 3 X 80 MG/0.8 ML Inject the contents of 2 pens (160 mg total) under the skin on day 1, then 1 pen (80 mg total) on day 15. Loading dose. 3 each 0    lamoTRIgine (LAMICTAL) 200 MG tablet Take 1 tablet (200 mg total) by mouth.      levalbuterol (XOPENEX HFA) 45 mcg/actuation inhaler Inhale 1 puff every four (4) hours as needed.      loperamide (IMODIUM) 2 mg capsule 2-4mg  by mouth q 4 hrs prn diarrhea 120 capsule 3    mercaptopurine (PURINETHOL) 50 mg tablet TAKE 1.5 TABLETS (75MG ) BY MOUTH EVERY DAY 135 tablet 0    methocarbamoL (ROBAXIN) 500 MG tablet Take 1 tablet (500 mg total) by mouth four (4) times a day.      naproxen (NAPROSYN) 250 MG tablet Take 1 tablet (250 mg total) by mouth Three (3) times a day as needed.      naproxen (NAPROSYN) 375 MG tablet Take 1 tablet (375 mg total) by mouth two (2) times a day as needed.      promethazine (PHENERGAN) 25 MG tablet 0.5-1 tab po q 8hrs prn nausea 30 tablet 2    UNABLE TO FIND Take 1 tablet by mouth in the morning. 5-Htp.       No current facility-administered medications for this visit.       Allergies   Allergen Reactions    Acetaminophen Nausea And Vomiting    Benadryl [Diphenhydramine Hcl] Anaphylaxis    Sulfa (Sulfonamide Antibiotics) Hives    Rozerem [Ramelteon] Other (See Comments)     Pt reports hallucinations, muscle contortions  & dry eyes    Shellfish Containing Products Diarrhea  and Nausea And Vomiting       Patient Active Problem List   Diagnosis    Crohn's disease of intestine (CMS-HCC)    Tobacco use disorder    Intestinal obstruction (CMS-HCC) Intestinal bypass or anastomosis status    Nausea with vomiting    Esophageal reflux    Major depression    Marijuana use    Chronic abdominal pain    Pilonidal cyst       Reviewed and up to date in Epic.    Appropriateness of Therapy     Acute infections noted within Epic:  No active infections  Patient reported infection: {Blank single:19197::None,***- patient reported to provider,***- pharmacy reported to provider}    Is medication and dose appropriate based on diagnosis and infection status? {Blank single:19197::Yes,No - evidence provided by prescriber in *** note}    Prescription has been clinically reviewed: {Blank single:19197::Yes,***}      Baseline Quality of Life Assessment      {DiseaseSpecificQOL:73897}    Financial Information     Medication Assistance provided: {sscmedassist:65303}    Anticipated copay of $*** reviewed with patient. Verified delivery address.    Delivery Information     Scheduled delivery date: ***    Expected start date: ***    Medication will be delivered via {Blank:19197::UPS,Next Day Courier,Same Day Courier,Clinic Courier - *** clinic,***} to the {Blank:19197::prescription,temporary} address in Epic WAM.  This shipment {Blank single:19197::will,will not} require a signature.      Explained the services we provide at [facility] and that each month we would call to set up refills.  Stressed importance of returning phone calls so that we could ensure they receive their medications in time each month.  Informed patient that we should be setting up refills 7-10 days prior to when they will run out of medication.  A pharmacist will reach out to perform a clinical assessment periodically.  Informed patient that a welcome packet, containing information about our pharmacy and other support services, a Notice of Privacy Practices, and a drug information handout will be sent.      The patient or caregiver noted above participated in the development of this care plan and knows that they can request review of or adjustments to the care plan at any time.      Patient or caregiver verbalized understanding of the above information as well as how to contact the pharmacy at [phone] option 4 with any questions/concerns.  The pharmacy is open Monday through Friday 8:30am-4:30pm.  A pharmacist is available 24/7 via pager to answer any clinical questions they may have.    Patient Specific Needs     Does the patient have any physical, cognitive, or cultural barriers? {Blank single:19197::No,Yes - ***}    Does the patient have adequate living arrangements? (i.e. the ability to store and take their medication appropriately) {Blank single:19197::Yes,No - ***}    Did you identify any home environmental safety or security hazards? {Blank single:19197::No,Yes - ***}    Patient prefers to have medications discussed with  {Blank single:19197::Patient,Family Member,Caregiver,Other}     Is the patient or caregiver able to read and understand education materials at a high school level or above? {Blank single:19197::No,Yes}    Patient's primary language is  {Blank single:19197::English,Spanish,***}     Is the patient high risk? {sschighriskpts:78327}    SOCIAL DETERMINANTS OF HEALTH     At the [facility], we have learned that life circumstances - like trouble affording food, housing,  utilities, or transportation can affect the health of many of our patients.   That is why we wanted to ask: are you currently experiencing any life circumstances that are negatively impacting your health and/or quality of life? {YES/NO/PATIENTDECLINED:93004}    Social Determinants of Health     Financial Resource Strain: Not on file   Internet Connectivity: Not on file   Food Insecurity: Not on file   Tobacco Use: Medium Risk (09/19/2021)    Patient History     Smoking Tobacco Use: Former     Smokeless Tobacco Use: Never     Passive Exposure: Not on file   Housing/Utilities: Not on file   Alcohol Use: Not on file   Transportation Needs: Not on file   Substance Use: Not on file   Health Literacy: Not on file   Physical Activity: Not on file   Interpersonal Safety: Not on file   Stress: Not on file   Intimate Partner Violence: Not on file   Depression: Not on file   Social Connections: Not on file       Would you be willing to receive help with any of the needs that you have identified today? {Yes/No/Not applicable:93005}       Teofilo Pod, PharmD  [facility] Specialty Pharmacist

## 2021-11-28 MED ORDER — EMPTY CONTAINER
0 refills | 0 days
Start: 2021-11-28 — End: ?

## 2021-11-28 NOTE — Unmapped (Addendum)
I reviewed this patient case and all documentation provided by the learner and was readily available for consultation during their interaction with the patient.  I agree with the assessment and plan listed below.    Arnold Long, PharmD   [facility] Specialty Pharmacist      [facility]   Patient Onboarding/Medication Counseling     Mr.Mcduffey is a 41 y.o. male with Crohn's who I am counseling today on initiation of therapy.  I am speaking to the patient.     Was a Nurse, learning disability used for this call? No     Verified patient's date of birth / HIPAA.     Specialty medication(s) to be sent: Inflammatory Disorders: Humira        Non-specialty medications/supplies to be sent: sharps container        Medications not needed at this time: n/a            Humira (adalimumab)     The patient declined counseling on medication administration, missed dose instructions, goals of therapy, side effects and monitoring parameters, warnings and precautions, drug/food interactions, and storage, handling precautions, and disposal because they have taken the medication previously. The information in the declined sections below are for informational purposes only and was not discussed with patient.      Medication & Administration      Dosage: Crohn's Disease: Inject 160mg  under the skin on day 1, 80mg  on day 15, then 40mg  every 14 days starting on day 29     Lab tests required prior to treatment initiation:  Tuberculosis:  Tuberculosis screening initially tested positive but 2 additional test conducted by the health dept were negative for TB (see documentation in media tab from 10/18). GI provider is aware and wants to initiate treatment at this time. .  Hepatitis B: Hepatitis B serology studies are complete and non-reactive.(Completed: 09/08/2020)     Administration:      Prefilled auto-injector pen  1. Gather all supplies needed for injection on a clean, flat working surface: medication pen removed from packaging, alcohol swab, sharps container, etc.  2. Look at the medication label - look for correct medication, correct dose, and check the expiration date  3. Look at the medication - the liquid visible in the window on the side of the pen device should appear clear and colorless  4. Lay the auto-injector pen on a flat surface and allow it to warm up to room temperature for at least 30-45 minutes  5. Select injection site - you can use the front of your thigh or your belly (but not the area 2 inches around your belly button); if someone else is giving you the injection you can also use your upper arm in the skin covering your triceps muscle  6. Prepare injection site - wash your hands and clean the skin at the injection site with an alcohol swab and let it air dry, do not touch the injection site again before the injection  7. Pull the 2 safety caps straight off - gray/white to uncover the needle cover and the plum cap to uncover the plum activator button, do not remove until immediately prior to injection and do not touch the white needle cover  8. Gently squeeze the area of cleaned skin and hold it firmly to create a firm surface at the selected injection site  9. Put the white needle cover against your skin at the injection site at a 90  degree angle, hold the pen such that you can see the clear medication window  10. Press down and hold the pen firmly against your skin, press the plum activator button to initiate the injection, there will be a click when the injection starts  11. Continue to hold the pen firmly against your skin for about 10-15 seconds - the window will start to turn solid yellow  12. To verify the injection is complete after 10-15 seconds, look and ensure the window is solid yellow and then pull the pen away from your skin  13. Dispose of the used auto-injector pen immediately in your sharps disposal container the needle will be covered automatically  14. If you see any blood at the injection site, press a cotton ball or gauze on the site and maintain pressure until the bleeding stops, do not rub the injection site     Adherence/Missed dose instructions:  If your injection is given more than 3 days after your scheduled injection date - consult your pharmacist for additional instructions on how to adjust your dosing schedule.     Goals of Therapy      - Achieve remission of symptoms  - Maintain remission of symptoms  - Minimize long-term systemic glucocorticoid use  - Prevent need for surgical procedures  - Maintenance of effective psychosocial functioning     Side Effects & Monitoring Parameters      Injection site reaction (redness, irritation, inflammation localized to the site of administration)  Signs of a common cold - minor sore throat, runny or stuffy nose, etc.  Upset stomach  Headache     The following side effects should be reported to the provider:  Signs of a hypersensitivity reaction - rash; hives; itching; red, swollen, blistered, or peeling skin; wheezing; tightness in the chest or throat; difficulty breathing, swallowing, or talking; swelling of the mouth, face, lips, tongue, or throat; etc.  Reduced immune function - report signs of infection such as fever; chills; body aches; very bad sore throat; ear or sinus pain; cough; more sputum or change in color of sputum; pain with passing urine; wound that will not heal, etc.  Also at a slightly higher risk of some malignancies (mainly skin and blood cancers) due to this reduced immune function.  In the case of signs of infection - the patient should hold the next dose of Humira?? and call your primary care provider to ensure adequate medical care.  Treatment may be resumed when infection is treated and patient is asymptomatic.  Changes in skin - a new growth or lump that forms; changes in shape, size, or color of a previous mole or marking  Signs of unexplained bruising or bleeding - throwing up blood or emesis that looks like coffee grounds; black, tarry, or bloody stool; etc.  Signs of new or worsening heart failure - shortness of breath; sudden weight gain; heartbeat that is not normal; swelling in the arms or legs that is new or worse        Contraindications, Warnings, & Precautions      Have your bloodwork checked as you have been told by your prescriber  Talk with your doctor if you are pregnant, planning to become pregnant, or breastfeeding  Discuss the possible need for holding your dose(s) of Humira?? when a planned procedure is scheduled with the prescriber as it may delay healing/recovery timeline         Drug/Food Interactions      Medication list reviewed in Epic.  The patient was instructed to inform the care team before taking any new medications or supplements. No drug interactions identified.   Talk with you prescriber or pharmacist before receiving any live vaccinations while taking this medication and after you stop taking it     Storage, Handling Precautions, & Disposal      Store this medication in the refrigerator.  Do not freeze  If needed, you may store at room temperature for up to 14 days  Store in original packaging, protected from light  Do not shake  Dispose of used syringes/pens in a sharps disposal container              Current Medications (including OTC/herbals), Comorbidities and Allergies      Current Medications          Current Outpatient Medications   Medication Sig Dispense Refill    acetaminophen (TYLENOL) 500 MG tablet Take 2 tablets (1,000 mg total) by mouth every four (4) hours as needed.        albuterol HFA 90 mcg/actuation inhaler Inhale 2 puffs every four (4) hours as needed. (Patient not taking: Reported on 09/19/2021)        ALPRAZolam (XANAX) 0.25 MG tablet Take 1 tablet (0.25 mg total) by mouth daily as needed.        buprenorphine-naloxone (SUBOXONE) 8-2 mg sublingual film TAKE ONE FILM DAILY        chlorhexidine (PERIDEX) 0.12 % solution RINSE WITH 15 ML FOR 30 SECONDS TWO TIMES DAILY IN AM AND PM (SPIT AFTER USE)--USE ONLY TWO WEEKS        cyanocobalamin, vitamin B-12, 1000 MCG tablet Take 1 tablet (1,000 mcg total) by mouth daily. Taking        divalproex ER (DEPAKOTE ER) 500 MG extended released 24 hr tablet TAKE 2 TABLETS BY MOUTH WITH SUPPER DAILY        empty container Misc Use as directed 1 each 3    ferrous sulfate 325 (65 FE) MG tablet Take 1 tablet (325 mg total) by mouth daily.        folic acid (FOLVITE) 1 MG tablet Take 1 tablet (1 mg total) by mouth daily. 30 tablet 11    HUMIRA PEN CITRATE FREE 40 MG/0.4 ML Inject the contents of 1 pen (40 mg total) under the skin every fourteen (14) days. Maintenance dose. 6 each 1    HUMIRA PEN CITRATE FREE STARTER PACK FOR CROHN'S/UC/HS 3 X 80 MG/0.8 ML Inject the contents of 2 pens (160 mg total) under the skin on day 1, then 1 pen (80 mg total) on day 15. Loading dose. 3 each 0    lamoTRIgine (LAMICTAL) 200 MG tablet Take 1 tablet (200 mg total) by mouth.        levalbuterol (XOPENEX HFA) 45 mcg/actuation inhaler Inhale 1 puff every four (4) hours as needed.        loperamide (IMODIUM) 2 mg capsule 2-4mg  by mouth q 4 hrs prn diarrhea 120 capsule 3    mercaptopurine (PURINETHOL) 50 mg tablet TAKE 1.5 TABLETS (75MG ) BY MOUTH EVERY DAY 135 tablet 0    methocarbamoL (ROBAXIN) 500 MG tablet Take 1 tablet (500 mg total) by mouth four (4) times a day.        naproxen (NAPROSYN) 250 MG tablet Take 1 tablet (250 mg total) by mouth Three (3) times a day as needed.        naproxen (NAPROSYN) 375 MG tablet Take 1 tablet (375 mg  total) by mouth two (2) times a day as needed.        promethazine (PHENERGAN) 25 MG tablet 0.5-1 tab po q 8hrs prn nausea 30 tablet 2    UNABLE TO FIND Take 1 tablet by mouth in the morning. 5-Htp.          No current facility-administered medications for this visit.                  Allergies   Allergen Reactions    Acetaminophen Nausea And Vomiting    Benadryl [Diphenhydramine Hcl] Anaphylaxis    Sulfa (Sulfonamide Antibiotics) Hives    Rozerem [Ramelteon] Other (See Comments)       Pt reports hallucinations, muscle contortions  & dry eyes    Shellfish Containing Products Diarrhea and Nausea And Vomiting             Patient Active Problem List   Diagnosis    Crohn's disease of intestine (CMS-HCC)    Tobacco use disorder    Intestinal obstruction (CMS-HCC)    Intestinal bypass or anastomosis status    Nausea with vomiting    Esophageal reflux    Major depression    Marijuana use    Chronic abdominal pain    Pilonidal cyst         Reviewed and up to date in Epic.     Appropriateness of Therapy      Acute infections noted within Epic:  No active infections  Patient reported infection: None     Is medication and dose appropriate based on diagnosis and infection status? Yes     Prescription has been clinically reviewed: Yes        Baseline Quality of Life Assessment       How many days over the past month did your Crohn's  keep you from your normal activities? For example, brushing your teeth or getting up in the morning. Patient states at least half of the month      Financial Information      Medication Assistance provided: None Required     Anticipated copay of $0 reviewed with patient. Verified delivery address.     Delivery Information      Scheduled delivery date: 12/06/21     Expected start date: 12/06/21     Medication will be delivered via UPS to the prescription address in [facility].  This shipment will not require a signature.       Explained the services we provide at [facility] and that each month we would call to set up refills.  Stressed importance of returning phone calls so that we could ensure they receive their medications in time each month.  Informed patient that we should be setting up refills 7-10 days prior to when they will run out of medication.  A pharmacist will reach out to perform a clinical assessment periodically.  Informed patient that a welcome packet, containing information about our pharmacy and other support services, a Notice of Privacy Practices, and a drug information handout will be sent.       The patient or caregiver noted above participated in the development of this care plan and knows that they can request review of or adjustments to the care plan at any time.       Patient or caregiver verbalized understanding of the above information as well as how to contact the pharmacy at [phone] option 4 with any questions/concerns.  The pharmacy is  open Monday through Friday 8:30am-4:30pm.  A pharmacist is available 24/7 via pager to answer any clinical questions they may have.     Patient Specific Needs      Does the patient have any physical, cognitive, or cultural barriers? No     Does the patient have adequate living arrangements? (i.e. the ability to store and take their medication appropriately) Yes     Did you identify any home environmental safety or security hazards? No     Patient prefers to have medications discussed with  Patient      Is the patient or caregiver able to read and understand education materials at a high school level or above? Yes     Patient's primary language is  English      Is the patient high risk? No     SOCIAL DETERMINANTS OF HEALTH      At the [facility], we have learned that life circumstances - like trouble affording food, housing, utilities, or transportation can affect the health of many of our patients.   That is why we wanted to ask: are you currently experiencing any life circumstances that are negatively impacting your health and/or quality of life? Patient declined to answer     Social Determinants of Health           Financial Resource Strain: Not on file   Internet Connectivity: Not on file   Food Insecurity: Not on file   Tobacco Use: Medium Risk (09/19/2021)     Patient History      Smoking Tobacco Use: Former      Smokeless Tobacco Use: Never      Passive Exposure: Not on file   Housing/Utilities: Not on file Alcohol Use: Not on file   Transportation Needs: Not on file   Substance Use: Not on file   Health Literacy: Not on file   Physical Activity: Not on file   Interpersonal Safety: Not on file   Stress: Not on file   Intimate Partner Violence: Not on file   Depression: Not on file   Social Connections: Not on file         Would you be willing to receive help with any of the needs that you have identified today? Not applicable         Sharyn Blitz  PharmD Candidate         [facility] Specialty Pharmacist

## 2021-12-05 MED FILL — EMPTY CONTAINER: 120 days supply | Qty: 1 | Fill #0

## 2021-12-12 DIAGNOSIS — K50912 Crohn's disease, unspecified, with intestinal obstruction: Secondary | ICD-10-CM | POA: Diagnosis not present

## 2021-12-13 LAB — C-REACTIVE PROTEIN: C-REACTIVE PROTEIN: 2 mg/L (ref 0–10)

## 2021-12-13 LAB — CBC
HEMATOCRIT: 37.7 % (ref 37.5–51.0)
HEMOGLOBIN: 12.3 g/dL — ABNORMAL LOW (ref 13.0–17.7)
MEAN CORPUSCULAR HEMOGLOBIN CONC: 32.6 g/dL (ref 31.5–35.7)
MEAN CORPUSCULAR HEMOGLOBIN: 28.2 pg (ref 26.6–33.0)
MEAN CORPUSCULAR VOLUME: 87 fL (ref 79–97)
PLATELET COUNT: 425 10*3/uL (ref 150–450)
RED BLOOD CELL COUNT: 4.36 x10E6/uL (ref 4.14–5.80)
RED CELL DISTRIBUTION WIDTH: 16.1 % — ABNORMAL HIGH (ref 11.6–15.4)
WHITE BLOOD CELL COUNT: 6.8 10*3/uL (ref 3.4–10.8)

## 2021-12-13 LAB — ALT: ALT (SGPT): 34 IU/L (ref 0–44)

## 2021-12-15 NOTE — Unmapped (Signed)
Patient still approved for humira under current coverage and was able to get next shipment from [facility] on 12/06/2021 to reload.

## 2021-12-15 NOTE — Unmapped (Signed)
erro

## 2021-12-19 NOTE — Unmapped (Signed)
[facility] Refill Coordination Note    Specialty Medication(s) to be Shipped:   Inflammatory Disorders: Humira    Other medication(s) to be shipped: No additional medications requested for fill at this time     Dakota Townsend, DOB: 09-28-1980  Phone: There are no phone numbers on file.      All above HIPAA information was verified with patient.     Was a Nurse, learning disability used for this call? No    Completed refill call assessment today to schedule patient's medication shipment from the [facility] [phone]).  All relevant notes have been reviewed.     Specialty medication(s) and dose(s) confirmed: Regimen is correct and unchanged.   Changes to medications: Floy reports no changes at this time.  Changes to insurance: No  New side effects reported not previously addressed with a pharmacist or physician: None reported  Questions for the pharmacist: No    Confirmed patient received a Conservation officer, historic buildings and a Surveyor, mining with first shipment. The patient will receive a drug information handout for each medication shipped and additional FDA Medication Guides as required.       DISEASE/MEDICATION-SPECIFIC INFORMATION        For patients on injectable medications: Patient currently has 1 doses left.  Next injection is scheduled for 12/21/21.    SPECIALTY MEDICATION ADHERENCE     Medication Adherence    Patient reported X missed doses in the last month: 0  Specialty Medication: Humira  Patient is on additional specialty medications: No                                Were doses missed due to medication being on hold? No    Humira 40 mg/ml: 2 days of medicine on hand        REFERRAL TO PHARMACIST     Referral to the pharmacist: Not needed      [facility]     Shipping address confirmed in Epic.     Delivery Scheduled: Yes, Expected medication delivery date: 12/29/21.     Medication will be delivered via UPS to the prescription address in Epic WAM.    Unk Lightning   [facility] Specialty Technician

## 2021-12-27 NOTE — Unmapped (Signed)
REASON FOR VISIT:  Crohn's    HISTORY OF PRESENT ILLNESS:  Since last visit,  11/14/2021, CXR. Decreased pleural effusion/thickening on right lung base. Decreased opacity in mid right lung.  11/21/2021, resume 6mp 75mg .  12/06/2021, resume adalimumab  12/12/2021, cbc, crp, alt normal except hgb 12.3        Finished Augmentin about 6 days ago. Feels bloated. No dyspnea or cough. No fevers. Having 4-5 soft, nonbloody bms per day (was worse on augmentin). No recent loperamide use. Mild nausea, no vomiting. Mild epig and LLQ abdominal pain-better after bm.  Takes 4-6 tabs of naproxen daily for post-surgical/chest pain. No post-prandial symptoms. No perianal drainage. Night time fecal incontinence x 1 last week.    MEDICATIONS:  has a current medication list which includes the following prescription(s): acetaminophen, albuterol, alprazolam, buprenorphine-naloxone, chlorhexidine, cyanocobalamin (vitamin b-12), divalproex er, empty container, empty container, ferrous sulfate, folic acid, humira(cf) pen, humira(cf) pen crohns-uc-hs, lamotrigine, levalbuterol, loperamide, mercaptopurine, methocarbamol, naproxen, naproxen, promethazine, and UNABLE TO FIND.    ALLERGIES:  Allergies as of 12/28/2021 - Reviewed 11/28/2021   Allergen Reaction Noted    Acetaminophen Nausea And Vomiting 04/27/2012    Benadryl [diphenhydramine hcl] Anaphylaxis 04/27/2012    Sulfa (sulfonamide antibiotics) Hives 04/27/2012    Rozerem [ramelteon] Other (See Comments) 05/11/2014    Shellfish containing products Diarrhea and Nausea And Vomiting 06/04/2011       PAST MEDICAL HISTORY:  1. Crohn's disease diagnosed as ileocolitis in 2007, treated initially with Pentasa and prednisone. Infliximab with partial response. Imuran with no help. Infusion reaction during infliximab retreatment. Adalimumab provided no benefit. Certolizumab with mild improvement but plateaued despite once weekly  dosing. In 12/2007, CT with ileitis, internal fistula and abscess. 02/04/08, ielocolic resection (40cm ti) by Dr. [place] Sever in [place]. 07/2008, diarrhea. 05/03/09, thickening of the neoterminal ileum on CT scan. 6-MP started 50 mg per day. 05/03/09, start adalimumab. 06/01/09, 6-MP increased to 75 mg. 07/19/09, desipramine to 100 mg per day without benefit. 11/10/2009, 6 inches of ileal resection (stricture) with primary anastomosis. 12/20/2009, reluctant to restart immunosuppression. 03/21/10, stopped desipramine due to lack of benefit and start Cymbalta. 05/25/10 Colonoscopy with i3 recurrence. Restart 6-MP 75mg , but declined adalimumab due to toe infections. 03/01/11, 6 inches of small bowel resected just proximal to the anastomosis. 05/22/11 start oral methotrexate. 06/04/11, CT abdomen and pelvis showed small-bowel dilation to 3.4 cm, prominent thickening of the ileocolonic anastomosis. Start ustekinumab. 10/26/11, colonoscopy showed minimal improvement. MTX switched to 25mg  Olmitz.  02/20/13, colonoscopy showed I2 disease (possibly mildly improved).  Patient self stopped methotrexate due to perceived increase infectious complications.  Continued ustekinumab monotherapy.  2017, stopped ustekinumab due to incarceration.  2018, worsening symptoms, start adalimumab in prison with improvement.  08/2019, stopped adalimumab upon release from prison.  Worsening symptoms. 09/08/2020, start 6mp 75mg  daily. 12/06/2020, start adalimumab. 05/01/2021, pilonidal cyst excision.2. 06/2014, pilonidal cyst excision. 07/27/21, strep intermedius empyema of the left lung. and adalimumab stopped.  3. Bipolar disorder  4. Substance use disorder  5. 09/08/20, PCV-20  6.  09/2020, QuantiFERON gold positive. 10/28/2020, [facility] health department determined patient did not need treatment for LTBI based on 2 repeat negative tests.  7.  02/09/2021, Shingrix No. 1    SOCIAL HISTORY:  Quit smoking 10 days ago.    FAMILY HISTORY:  No colon cancer    PHYSICAL EXAM:  There were no vitals taken for this visit.  Wt Readings from Last 4 Encounters:   09/19/21 66.6 kg (146 lb 14.4  oz)   07/18/21 63.5 kg (140 lb)   07/04/21 66.3 kg (146 lb 3.2 oz)   05/30/21 68.9 kg (151 lb 12.8 oz)     CONSTITUTIONAL: awake, alert, NAD  EYES: no scleral icterus or conjunctival injection  GASTROINTESTINAL: Abdomen is soft, mild to moderate generalized abdominal tenderness, nondistended, no mass  NEUROLOGIC: oriented x 3  Perianal exam: Mild erythema in the perianal skin.  Moderate tenderness anterior and left of the anal verge.  No fluctuance or visible abscess.    TEST DATA:  1. 05/03/09, CT scan with thickening of neoterminal ileum and subcm mesenteric lymphadenopathy. No stranding, abscess, or fistula.   2.08/26/2009, EGD was normal.   3.08/26/2009, colonoscopy to the neoterminal ileum. Poor bowel prep. Edema, erythema and shallow ulcerations at ICA and neoterminal ileum. Benign-appearing mild stenosis just proximal to ICA that was not traversed.  4. 10/28/2009, small bowel follow through showed 4-hour transit time, adherent small bowel loops in the pelvis that were difficult to separate, 1.5-2 cm segment of stricture in the neoterminal ileum that was 4 mm in diameter, more proximal 2.25 cm of neoterminal ileum was mildly narrowed.   5. 05/20/10, colonoscopy to neoterminal ileum with poor prep, i3 recurrence.   6. 02/07/2011, colonoscopy to 5 cm in the terminal ileum showed a linear ulcer at the ileocolonic anastomosis and severe ulceration in the neoterminal ileum that was worse than before. Terminal ileum could not be intubated more deeply due to edema and/or stricturing.   7. 06/04/11, CT abdomen and pelvis showed small-bowel dilatation to 3.4 cm, gas and stool in colon, prominent thickening at the ileocolonic anastomosis  8. 10/26/11 Colonoscopy- ICA with linear ulceration confined to the anastamotic line. >5 medium sized aphthous ulcers with normal intervening mucosa in the neoti. Slightly improved compared to prior pre-op colonoscopy.   9. 02/20/13, colonoscopy. Normal ICA. i2 disease in the distal 6 cm of TI.  Slightly improved.  10. 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  09/12/2020, CTAP.  Gallbladder wall thickening.  Marked thickening of the terminal ileum x20 cm.  2 cm mesenteric lymphadenopathy  12.  09/12/2020, right upper quadrant ultrasound.  Gallbladder sludge, wall thickening.  13.  09/14/2020, HIDA scan.  Normal.  14. 07/18/2021, colonoscopy to TI x1 cm.  Fair prep.  Nondraining sinus tract versus fistula on perianal exam.  Edema, small superficial ulcers and mild stenosis (1 cm) at ileocolonic anastomosis.  Dilated to 13.5 mm.  Anastomosis was traversed from, but deeper intubation of the ileum was prevented by looping.     ASSESSMENT:  1. Ileal Crohn's disease.  Stricturing phenotype.  Currently mildly symptomatic with diarrhea off therapy due to recent infection.  Probably due to Crohn's ileitis.  Recommend resuming adalimumab and azathioprine after cleared by pulmonologist.  Reviewed risks including risks of infection, malignancy, hepatitis, pancreatitis, cytopenias, psoriasis, inflammatory reactions in the liver, nerves.  These medicines require intensive monitoring for toxicity.  Continue Imodium and antiemetics as needed for symptom control.  May need bile salt binder in the future to reduce diarrhea.  2.  By patient report, Imuran caused possible rash and flushing, 6-MP and methotrexate increased infections.   3.  Soon-to-be medically immune suppressed, in need of vaccination.  4.  Recent empyema of unclear etiology.  Status post chest tube and antibiotic therapy.  May be a dental source.  Follow-up with pulmonologist on Thursday as scheduled.    RECOMMENDATIONS AND PLAN:  There are no Patient Instructions on  file for this visit. as needed for nausea.  -Loperamide as needed for diarrhea (I sent in refills).  -Shingrix shingles vaccine #2 today  -Continue efforts to completely stop smoking.  -Sunscreen when outdoors as can increase risk of skin cancers.  -Telehealth visit with me in 6 months or sooner if needed

## 2021-12-28 ENCOUNTER — Ambulatory Visit: Admit: 2021-12-28 | Discharge: 2021-12-29 | Payer: MEDICARE | Attending: Gastroenterology | Primary: Gastroenterology

## 2021-12-28 DIAGNOSIS — K50912 Crohn's disease, unspecified, with intestinal obstruction: Principal | ICD-10-CM

## 2021-12-28 DIAGNOSIS — Z888 Allergy status to other drugs, medicaments and biological substances status: Secondary | ICD-10-CM | POA: Diagnosis not present

## 2021-12-28 DIAGNOSIS — Z23 Encounter for immunization: Secondary | ICD-10-CM | POA: Diagnosis not present

## 2021-12-28 DIAGNOSIS — F17210 Nicotine dependence, cigarettes, uncomplicated: Secondary | ICD-10-CM | POA: Diagnosis not present

## 2021-12-28 DIAGNOSIS — Z882 Allergy status to sulfonamides status: Secondary | ICD-10-CM | POA: Diagnosis not present

## 2021-12-28 MED ORDER — LOPERAMIDE 2 MG CAPSULE
ORAL_CAPSULE | 5 refills | 0 days | Status: CP
Start: 2021-12-28 — End: ?

## 2021-12-28 MED FILL — HUMIRA PEN CITRATE FREE 40 MG/0.4 ML: SUBCUTANEOUS | 28 days supply | Qty: 2 | Fill #0

## 2021-12-28 NOTE — Unmapped (Addendum)
-  Take the loading dose Humira shot now and then start with the maintenance shot every 2 weeks starting 1 week from today.  -Continue 75mg  per day  -Bloodwork every 3 months at [facility].  Next due in early March.  -Phenergan as needed for nausea.  -Loperamide as needed for diarrhea (I sent in refills).  -Shingrix shingles vaccine #2 today  -Continue efforts to completely stop smoking.  -Sunscreen when outdoors as can increase risk of skin cancers.  -Telehealth visit with me in 6 months or sooner if needed

## 2022-01-02 MED ORDER — PROMETHAZINE 25 MG TABLET
ORAL_TABLET | 2 refills | 0 days | Status: CP
Start: 2022-01-02 — End: ?

## 2022-01-03 NOTE — Unmapped (Signed)
refill 

## 2022-01-23 NOTE — Unmapped (Signed)
[facility] Refill Coordination Note    Specialty Medication(s) to be Shipped:   Inflammatory Disorders: Humira    Other medication(s) to be shipped: No additional medications requested for fill at this time     Dakota Townsend, DOB: Apr 27, 1980  Phone: There are no phone numbers on file.      All above HIPAA information was verified with patient.     Was a Nurse, learning disability used for this call? No    Completed refill call assessment today to schedule patient's medication shipment from the [facility] [phone]).  All relevant notes have been reviewed.     Specialty medication(s) and dose(s) confirmed: Regimen is correct and unchanged.   Changes to medications: Chadd reports no changes at this time.  Changes to insurance: No  New side effects reported not previously addressed with a pharmacist or physician: None reported  Questions for the pharmacist: No    Confirmed patient received a Conservation officer, historic buildings and a Surveyor, mining with first shipment. The patient will receive a drug information handout for each medication shipped and additional FDA Medication Guides as required.       DISEASE/MEDICATION-SPECIFIC INFORMATION        For patients on injectable medications: Patient currently has 0 doses left.  Next injection is scheduled for 1/24.    SPECIALTY MEDICATION ADHERENCE     Medication Adherence    Patient reported X missed doses in the last month: 0  Specialty Medication: HUMIRA(CF) PEN 40 mg/0.4 mL  Patient is on additional specialty medications: No                          Were doses missed due to medication being on hold? No    Humira 40/0.4 mg/ml: 0 days of medicine on hand        REFERRAL TO PHARMACIST     Referral to the pharmacist: Not needed      [facility]     Shipping address confirmed in Epic.     Delivery Scheduled: Yes, Expected medication delivery date: 01/30/22.     Medication will be delivered via UPS to the prescription address in Epic WAM.    [facility] Specialty Technician

## 2022-01-29 MED FILL — HUMIRA PEN CITRATE FREE 40 MG/0.4 ML: SUBCUTANEOUS | 28 days supply | Qty: 2 | Fill #1

## 2022-02-12 DIAGNOSIS — K50912 Crohn's disease, unspecified, with intestinal obstruction: Principal | ICD-10-CM

## 2022-02-19 ENCOUNTER — Ambulatory Visit: Payer: 59 | Admitting: Internal Medicine

## 2022-02-19 ENCOUNTER — Encounter: Payer: Self-pay | Admitting: Internal Medicine

## 2022-02-19 ENCOUNTER — Ambulatory Visit (INDEPENDENT_AMBULATORY_CARE_PROVIDER_SITE_OTHER): Payer: 59 | Admitting: Internal Medicine

## 2022-02-19 ENCOUNTER — Ambulatory Visit (HOSPITAL_COMMUNITY)
Admission: RE | Admit: 2022-02-19 | Discharge: 2022-02-19 | Disposition: A | Discharge status: Home / Self Care | Payer: 59 | Source: Ambulatory Visit | Attending: Internal Medicine | Admitting: Internal Medicine

## 2022-02-19 VITALS — BP 130/68 | HR 100 | Ht 71.0 in | Wt 156.0 lb

## 2022-02-19 DIAGNOSIS — J869 Pyothorax without fistula: Secondary | ICD-10-CM | POA: Diagnosis not present

## 2022-02-19 DIAGNOSIS — G8922 Chronic post-thoracotomy pain: Secondary | ICD-10-CM | POA: Diagnosis not present

## 2022-02-19 NOTE — Progress Notes (Deleted)
Seth Foster, male    DOB: 1980-05-31    MRN: [id number]   Brief patient profile:  41  yowm  quit smoking summer 2023  referred to pulmonary clinic in [place]  09/21/2021 by Dr Windell Norfolk  for post hosp f/u for empyema related to dental surgery complications     Date of Admission: 08/15/2021    Date of Discharge: No discharge date for patient encounter. Attending Physician: Lucious Groves, DO   Discharge Diagnosis: Empyema of the right pleural space Crohn's disease of intestine Normocytic anemia Opioid use disorder Type II bipolar Tobacco use disorder   Rt-sided complex empyema Chest tube placed 8/9 and removed 8/13.  Blood culture and pleural fluid were negative to date.  MRSA nasal swab was also negative. -Patient received 7 days of vancomycin/cefepime and transition to 3 more weeks of Augmentin after discharge. -Patient will need a repeat CT scan in 6 weeks and follow-up with pulmonology at [facility].   Opioid use disorder Patient has a history of OUD with IV opiates use in the past, reported last over 5 years ago.  Patient has used Suboxone in the past but he thought it was not helpful.  Patient is agreeable with trying Suboxone again with [facility].  -He was prescribed 7 days of oxycodone after discharge, with Robaxin and naproxen.   Normocytic anemia Thrombocytosis Iron studies on 7/16 suggest iron deficiency anemia, which could be related to his lung infection and chest tubes.  He was not started on iron supplement during this admission. -Consider supplement at follow-up if infections resolved.   EKG changes New findings QRS findings in lead 1/3 and t wave inversions in 3, could be secondary to severe pulmonary infection. Echo here showed a moderately enlarged right ventricle without elevation of right atrial pressure and an EF of 50 to 55% with global hypokinesis left ventricle. -Consider repeat echocardiogram when infection resolves.   Crohn's disease Patient will need  follow-up with GI for his immunomodulators   Tobacco use disorder -Encourage cessation.  Can offer nicotine patch if agreeable   Type 2 bipolar disorder He has been stable on Depakote and Lamictal prior to admission.  Patient was started on a short course of Xanax during this hospitalization but patient understands that he will not be prescribed benzodiazepine after discharge. -Patient will need to follow-up with a behavioral health provider at follow-up   2.  Labs / imaging needed at time of follow-up: NA   3.  Pending labs/ test needing follow-up: CBC, BMP   Follow-up Appointments: -[facility] 08/28/21, 2:45 PM with Dr. 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   Right sided complex empyema Multifocal pneumonia He was recently admitted at [facility] in [place] for right-sided empyema with cultures that grew strep intermedius.  He was discharged on 08/04/2021 after antibiotic treatment, chest tubes, and VATS decortication on Augmentin with a stop date of 09/04/2021. He returned to [facility] ED for right sided pleuritic chest pain and dyspnea. Imaging here show recurrence/persistence of the  right sided empyema.  On admission he was tachypneic and definitely uncomfortable but overall hemodynamically stable and concern for sepsis was overall low with lactic of 2->1.9. He was put on Vanc/Cefepime on 08/15/2021. PulmCrit and CT surgery were consulted.    Chest tube was placed on 8/9 and he  had 3 rounds of lytic therapy with over 1 L output from the chest tube before output decreased. Chest tube removed on 8/13.  Culture and pleural fluid culture remain negative to date.  Nasal swab MRSA was negative.  Patient was discharged with 3 more weeks of Augmentin and will follow-up with pulmonology for repeat CT chest in 6 weeks.   EKG Changes Prior Ekg from 2021, new findings QRS findings in lead 1/3 and t wave inversions in 3. Possible secondary to severe pulmonary infection.  Echo here showed a moderately enlarged right ventricle without elevation of right atrial pressure and an EF of 50 to 55% with global hypokinesis left ventricle.  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 We do recommend that there be a follow-up echocardiogram after discharge.   Transaminitis  Admitted within AST of 74 and ALT of 82.  Low concern for seeding of the liver but we monitored this and watch for any new right upper quadrant pain.  His transaminitis resolved and he did not show any signs of right upper quadrant pain that would have indicated [place] to get a right upper quadrant ultrasound during his admission.   Discharge Subjective: Patient was seen at bedside.  He appears comfortable in no acute distress.  He states that pain is reasonably controlled with oxycodone and Robaxin.  Patient is able to ambulate to the restroom by himself.  We discussed discharge plan with 3 more weeks of Augmentin and 7 days of oxycodone.  Patient has used Suboxone in the past and states that it was not helpful.  However he agrees to trying Suboxone again with the [facility].  Patient lives at home with his parents who can help him.        History of Present Illness  09/21/2021  Pulmonary/ 1st office eval/ Lucrecia Mcphearson / [place] Office  Chief Complaint  Patient presents  with   Hospitalization Follow-up    He is doing some better and has some chest tightness and feels his right lung is not working as well   Dyspnea:  since d/c walking more / more active / light yard work  Cough: not much since d/c Sleep: flat in bed/ 2 pillows  SABA use: none New area of cp x one week but improved on its own x 2 days  on naprosyn/tylenol but worse with deep breath and worse lying down  Rec The pain over your back would probably respond to zostrix cream applied four times a day.     11/14/2021  f/u ov/[place] office/Camiah Humm re: empyema  maint on no resp rx / not back on humira yet due to concerns with ongoing ? Empyema  Chief Complaint  Patient presents with   Follow-up    Having some pain and wants to know if that will belong term Would like work note clearing him to go back to work    Dyspnea:  walkathon x 20 miles/ some wt lifting  Cough: minimal yellowish  Sleeping: level/ positional pain when sleeping  SABA use: none  02: none  Covid status: none/ never  R ant cw pain/ numbness in same distribution as dermatome where chest tube placed, did not give zostrix much of a try and instead now taking meloxicam 7.5 daily per PCP and wants refills  Has mild ant /lat R cp only with deep breath/ R should pains positonal. Loss of appetite/ abd pain starting back up as in past rx'd with humira still on hold  Rec Ok to return to work full time without restrictions  Meloxicam 7.5 mg with meals daily as needed for chest pains  Zostric cream 4 x  daily x 2 weeks then stop if not convinced its helping   Please remember to go to the  x-ray department  > improved  02/19/2022  f/u ov/[place] office/Kharisma Glasner re: *** maint on ***  No chief complaint on file.   Dyspnea:  *** Cough: *** Sleeping: *** SABA use: *** 02: *** Covid status: *** Lung cancer screening: ***   No obvious day to day or daytime variability or assoc excess/ purulent sputum or mucus plugs or hemoptysis or  cp or chest tightness, subjective wheeze or overt sinus or hb symptoms.   *** without nocturnal  or early am exacerbation  of respiratory  c/o's or need for noct saba. Also denies any obvious fluctuation of symptoms with weather or environmental changes or other aggravating or alleviating factors except as outlined above   No unusual exposure hx or h/o childhood pna/ asthma or knowledge of premature birth.  Current Allergies, Complete Past Medical History, Past Surgical History, Family History, and Social History were reviewed in [employer] record.  ROS  The following are not active complaints unless bolded Hoarseness, sore throat, dysphagia, dental problems, itching, sneezing,  nasal congestion or discharge of excess mucus or purulent secretions, ear ache,   fever, chills, sweats, unintended wt loss or wt gain, classically pleuritic or exertional cp,  orthopnea pnd or arm/hand swelling  or leg swelling, presyncope, palpitations, abdominal pain, anorexia, nausea, vomiting, diarrhea  or change in bowel habits or change in bladder habits, change in stools or change in urine, dysuria, hematuria,  rash, arthralgias, visual complaints, headache, numbness, weakness or ataxia or problems with walking or coordination,  change in mood or  memory.        No outpatient medications have been marked as taking for the 02/19/22 encounter (Appointment) with Tanda Rockers, MD.              Past Medical History:  Diagnosis Date   Asthma    Chronic pain syndrome    Crohn disease ([place])        Objective:      wts  02/19/2022        ***  11/14/2021       152  10/03/2021       143    09/21/21 146 lb 9.6 oz (66.5 kg)  08/28/21 138 lb 4.8 oz (62.7 kg)  08/28/21 138 lb (62.6 kg)     Vital signs reviewed  02/19/2022  - Note at rest 02 sats  ***% on ***   General appearance:    ***    Assessment

## 2022-02-19 NOTE — Patient Instructions (Signed)
Zosxtric cream 4 x  daily x 2 weeks then if helping taper to just using at bedtime  Please remember to go to the  x-ray department  for your tests - we will call you with the results when they are available    Lung cancer screening starts at age 41  - in the meantime strongly advise stop all smoking  Follow up here is as needed

## 2022-02-19 NOTE — Assessment & Plan Note (Addendum)
Onset of symptoms was w/in weeks of L sided dental infections June 2023 -  pt wit Crohn's dz on adalimumab and 54m [facility] [place] [facility] cultures that grew strep intermedius.  He was discharged on 08/04/2021 after antibiotic treatment, chest tubes, and VATS decortication on Augmentin with a stop date of 09/04/2021.  - see readmit 8/823 for lytics - 09/21/2021 cxr stable/ ESR 12 > no change cxr 10/03/2021  - 11/14/2021 marked improvement on cxr > f/u 3 m rec  plus zostrix/ meloxicam prn for residual aches/ paresthesias  R chest  wall   Marked clinical improvement > needs f/u cxr now and LDSCT starting at age [age]x 4-5 years/ advised   Otherwise pulmonary f/u is prn

## 2022-02-19 NOTE — Assessment & Plan Note (Signed)
S/p VATS and muliple chest tubes/ lytics summer of 2023  - rec trial of Zostrix cream 09/21/2021 >>> partial response reported 02/19/2022 rec qhs dosing          Each maintenance medication was reviewed in detail including emphasizing most importantly the difference between maintenance and prns and under what circumstances the prns are to be triggered using an action plan format where appropriate.  Total time for H and P, chart review, counseling,  and generating customized AVS unique to this office visit / same day charting = 23 min

## 2022-02-19 NOTE — Progress Notes (Signed)
Seth Foster, male    DOB: 05-28-1980    MRN: [id number]   Brief patient profile:  42  yowm  quit smoking summer 2023  referred to pulmonary clinic in [place]  09/21/2021 by Dr Windell Norfolk  for post hosp f/u for empyema related to dental surgery complications     Date of Admission: 08/15/2021    Date of Discharge: No discharge date for patient encounter. Attending Physician: Lucious Groves, DO   Discharge Diagnosis: Empyema of the right pleural space Crohn's disease of intestine Normocytic anemia Opioid use disorder Type II bipolar Tobacco use disorder   Rt-sided complex empyema Chest tube placed 8/9 and removed 8/13.  Blood culture and pleural fluid were negative to date.  MRSA nasal swab was also negative. -Patient received 7 days of vancomycin/cefepime and transition to 3 more weeks of Augmentin after discharge. -Patient will need a repeat CT scan in 6 weeks and follow-up with pulmonology at [facility].   Opioid use disorder Patient has a history of OUD with IV opiates use in the past, reported last over 5 years ago.  Patient has used Suboxone in the past but he thought it was not helpful.  Patient is agreeable with trying Suboxone again with [facility].  -He was prescribed 7 days of oxycodone after discharge, with Robaxin and naproxen.   Normocytic anemia Thrombocytosis Iron studies on 7/16 suggest iron deficiency anemia, which could be related to his lung infection and chest tubes.  He was not started on iron supplement during this admission. -Consider supplement at follow-up if infections resolved.   EKG changes New findings QRS findings in lead 1/3 and t wave inversions in 3, could be secondary to severe pulmonary infection. Echo here showed a moderately enlarged right ventricle without elevation of right atrial pressure and an EF of 50 to 55% with global hypokinesis left ventricle. -Consider repeat echocardiogram when infection resolves.   Crohn's disease Patient will need  follow-up with GI for his immunomodulators   Tobacco use disorder -Encourage cessation.  Can offer nicotine patch if agreeable   Type 2 bipolar disorder He has been stable on Depakote and Lamictal prior to admission.  Patient was started on a short course of Xanax during this hospitalization but patient understands that he will not be prescribed benzodiazepine after discharge. -Patient will need to follow-up with a behavioral health provider at follow-up   2.  Labs / imaging needed at time of follow-up: NA   3.  Pending labs/ test needing follow-up: CBC, BMP   Follow-up Appointments: -[facility] 08/28/21, 2:45 PM with Dr. 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   Right sided complex empyema Multifocal pneumonia He was recently admitted at [facility] in [place] for right-sided empyema with cultures that grew strep intermedius.  He was discharged on 08/04/2021 after antibiotic treatment, chest tubes, and VATS decortication on Augmentin with a stop date of 09/04/2021. He returned to [facility] ED for right sided pleuritic chest pain and dyspnea. Imaging here show recurrence/persistence of the  right sided empyema.  On admission he was tachypneic and definitely uncomfortable but overall hemodynamically stable and concern for sepsis was overall low with lactic of 2->1.9. He was put on Vanc/Cefepime on 08/15/2021. PulmCrit and CT surgery were consulted.    Chest tube was placed on 8/9 and he  had 3 rounds of lytic therapy with over 1 L output from the chest tube before output decreased. Chest tube removed on 8/13.  Culture and pleural fluid culture remain negative to date.  Nasal swab MRSA was negative.  Patient was discharged with 3 more weeks of Augmentin and will follow-up with pulmonology for repeat CT chest in 6 weeks.   EKG Changes Prior Ekg from 2021, new findings QRS findings in lead 1/3 and t wave inversions in 3. Possible secondary to severe pulmonary infection.  Echo here showed a moderately enlarged right ventricle without elevation of right atrial pressure and an EF of 50 to 55% with global hypokinesis left ventricle.  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 We do recommend that there be a follow-up echocardiogram after discharge.   Transaminitis  Admitted within AST of 74 and ALT of 82.  Low concern for seeding of the liver but we monitored this and watch for any new right upper quadrant pain.  His transaminitis resolved and he did not show any signs of right upper quadrant pain that would have indicated [place] to get a right upper quadrant ultrasound during his admission.   Discharge Subjective: Patient was seen at bedside.  He appears comfortable in no acute distress.  He states that pain is reasonably controlled with oxycodone and Robaxin.  Patient is able to ambulate to the restroom by himself.  We discussed discharge plan with 3 more weeks of Augmentin and 7 days of oxycodone.  Patient has used Suboxone in the past and states that it was not helpful.  However he agrees to trying Suboxone again with the [facility].  Patient lives at home with his parents who can help him.        History of Present Illness  09/21/2021  Pulmonary/ 1st office eval/ [facility] Frisch / [place] Office  Chief Complaint  Patient presents  with   Hospitalization Follow-up    He is doing some better and has some chest tightness and feels his right lung is not working as well   Dyspnea:  since d/c walking more / more active / light yard work  Cough: not much since d/c Sleep: flat in bed/ 2 pillows  SABA use: none New area of cp x one week but improved on its own x 2 days  on naprosyn/tylenol but worse with deep breath and worse lying down  Rec The pain over your back would probably respond to zostrix cream applied four times a day.     11/14/2021  f/u ov/[place] office/Ronnell Makarewicz re: empyema  maint on no resp rx / not back on humira yet due to concerns with ongoing ? Empyema  Chief Complaint  Patient presents with   Follow-up    Having some pain and wants to know if that will belong term Would like work note clearing him to go back to work    Dyspnea:  walkathon x 20 miles/ some wt lifting  Cough: minimal yellowish  Sleeping: level/ positional pain when sleeping  SABA use: none  02: none  Covid status: none/ never  R ant cw pain/ numbness in same distribution as dermatome where chest tube placed, did not give zostrix much of a try and instead now taking meloxicam 7.5 daily per PCP and wants refills  Has mild ant /lat R cp only with deep breath/ R should pains positonal. Loss of appetite/ abd pain starting back up as in past rx'd with humira still on hold  Rec Ok to return to work full time without restrictions  Meloxicam 7.5 mg with meals daily as needed for chest pains  Zostric cream 4 x  daily x 2 weeks then stop if not convinced its helping      02/19/2022  f/u ov/[place] office/Rameen Quinney re: empyema  maint on nothing   Chief Complaint  Patient presents with   Follow-up    Doing well since last ov. No new concerns   Dyspnea:  not limited by breathing  Cough: minimal  Sleeping: no resp cc  SABA [id number] 02: none  Covid status: vax neg/ one infection  Still having some "pulling sensation" on R along same  dermatome as VATS and some better with zostrix but didn't know how to taper it     No obvious day to day or daytime variability or assoc excess/ purulent sputum or mucus plugs or hemoptysis or cp or chest tightness, subjective wheeze or overt sinus or hb symptoms.   Sleeping  without nocturnal  or early am exacerbation  of respiratory  c/o's or need for noct saba. Also denies any obvious fluctuation of symptoms with weather or environmental changes or other aggravating or alleviating factors except as outlined above   No unusual exposure hx or h/o childhood pna/ asthma or knowledge of premature birth.  Current Allergies, Complete Past Medical History, Past Surgical History, Family History, and Social History were reviewed in [employer] record.  ROS  The following are not active complaints unless bolded Hoarseness, sore throat, dysphagia, dental problems, itching, sneezing,  nasal congestion or discharge of excess mucus or purulent secretions, ear ache,   fever, chills, sweats, unintended wt loss or wt gain, classically pleuritic or exertional cp,  orthopnea pnd or arm/hand swelling  or leg swelling, presyncope, palpitations, abdominal pain, anorexia, nausea, vomiting, diarrhea  or change in bowel habits or change in bladder habits, change in stools or change in urine, dysuria, hematuria,  rash, arthralgias, visual complaints, headache, numbness, weakness or ataxia or problems with walking or coordination,  change in mood or  memory.        Current Meds  Medication Sig   acetaminophen (TYLENOL) 500 MG tablet Take 1,000 mg by mouth every 4 (four) hours as needed for moderate pain.   albuterol (PROVENTIL HFA;VENTOLIN HFA) 108 (90 BASE) MCG/ACT inhaler Inhale 2 puffs into the lungs every 6 (six) hours as needed for wheezing or shortness of breath.   ALPRAZolam (XANAX) 0.25 MG tablet Take 0.25 mg by mouth daily.   Buprenorphine HCl-Naloxone HCl (SUBOXONE) 8-2 MG FILM Place  under the tongue.   divalproex (DEPAKOTE ER) 500 MG 24 hr tablet Take 1,000 mg by mouth every evening.   HUMIRA PEN 40 MG/0.4ML PNKT Inject 40 mg into the skin every 14 (fourteen) days.   ibuprofen (ADVIL) 200 MG tablet Take 600-800 mg by mouth every 4 (four) hours as needed for moderate pain.   lamoTRIgine (LAMICTAL) 200 MG tablet Take 200 mg by mouth every evening.   loperamide (IMODIUM) 2 MG  capsule Take by mouth.   meloxicam (MOBIC) 7.5 MG tablet Take 1 tablet (7.5 mg total) by mouth daily as needed.   promethazine (PHENERGAN) 12.5 MG tablet Take 12.5 mg by mouth every 6 (six) hours as needed for nausea or vomiting.              Past Medical History:  Diagnosis Date   Asthma    Chronic pain syndrome    Crohn disease ([place])        Objective:      wts  02/19/2022       156   11/14/2021       152  10/03/2021       143    09/21/21 146 lb 9.6 oz (66.5 kg)  08/28/21 138 lb 4.8 oz (62.7 kg)  08/28/21 138 lb (62.6 kg)     Vital signs reviewed  02/19/2022  - Note at rest 02 sats  97% on RA   General appearance:    pleasant amb wm nad   HEENT : Oropharynx  clear/ s/p multiple tooth extractions          NECK :  without  apparent JVD/ palpable Nodes/TM    LUNGS: no acc muscle use,  Nl contour chest which is clear to A and P bilaterally without cough on insp or exp maneuvers   CV:  RRR  no s3 or murmur or increase in P2, and no edema   ABD:  soft and nontender   MS:  Nl gait/ ext warm without deformities Or obvious joint restrictions  calf tenderness, cyanosis or clubbing    SKIN: warm and dry without lesions    NEURO:  alert, approp, nl sensorium with  no motor or cerebellar deficits apparent.     Cxr rec 02/19/2022 not done    Assessment

## 2022-02-22 NOTE — Unmapped (Signed)
[facility] Refill Coordination Note    Specialty Medication(s) to be Shipped:   Inflammatory Disorders: Humira    Other medication(s) to be shipped: No additional medications requested for fill at this time     Dakota Townsend, DOB: September 11, 1980  Phone: There are no phone numbers on file.      All above HIPAA information was verified with patient.     Was a Nurse, learning disability used for this call? No    Completed refill call assessment today to schedule patient's medication shipment from the [facility] [phone]).  All relevant notes have been reviewed.     Specialty medication(s) and dose(s) confirmed: Regimen is correct and unchanged.   Changes to medications: Dondre reports no changes at this time.  Changes to insurance: No  New side effects reported not previously addressed with a pharmacist or physician: None reported  Questions for the pharmacist: No    Confirmed patient received a Conservation officer, historic buildings and a Surveyor, mining with first shipment. The patient will receive a drug information handout for each medication shipped and additional FDA Medication Guides as required.       DISEASE/MEDICATION-SPECIFIC INFORMATION        For patients on injectable medications: Patient currently has 0 doses left.  Next injection is scheduled for 2/22.    SPECIALTY MEDICATION ADHERENCE     Medication Adherence    Patient reported X missed doses in the last month: 0  Specialty Medication: HUMIRA(CF) PEN 40 mg/0.4 mL  Patient is on additional specialty medications: No              Were doses missed due to medication being on hold? No    Humira 40/0.4 mg/ml: 0 days of medicine on hand        REFERRAL TO PHARMACIST     Referral to the pharmacist: Not needed      [facility]     Shipping address confirmed in Epic.     Patient was notified of new phone menu : No    Delivery Scheduled: Yes, Expected medication delivery date: 02/27/22.     Medication will be delivered via UPS to the prescription address in Epic WAM.    Willette Pa   [facility] Specialty Technician

## 2022-02-26 MED FILL — HUMIRA PEN CITRATE FREE 40 MG/0.4 ML: SUBCUTANEOUS | 28 days supply | Qty: 2 | Fill #2

## 2022-03-16 ENCOUNTER — Ambulatory Visit: Admit: 2022-03-16 | Discharge: 2022-03-17 | Payer: MEDICARE

## 2022-03-21 DIAGNOSIS — K50912 Crohn's disease, unspecified, with intestinal obstruction: Secondary | ICD-10-CM | POA: Diagnosis not present

## 2022-03-21 NOTE — Unmapped (Signed)
[facility] Refill Coordination Note    Specialty Medication(s) to be Shipped:   Inflammatory Disorders: Humira    Other medication(s) to be shipped: No additional medications requested for fill at this time     Dakota Townsend, DOB: 29-Dec-1980  Phone: [phone] (home)       All above HIPAA information was verified with patient.     Was a Nurse, learning disability used for this call? No    Completed refill call assessment today to schedule patient's medication shipment from the [facility] [phone]).  All relevant notes have been reviewed.     Specialty medication(s) and dose(s) confirmed: Regimen is correct and unchanged.   Changes to medications: Farris reports no changes at this time.  Changes to insurance: No  New side effects reported not previously addressed with a pharmacist or physician: None reported  Questions for the pharmacist: No    Confirmed patient received a Conservation officer, historic buildings and a Surveyor, mining with first shipment. The patient will receive a drug information handout for each medication shipped and additional FDA Medication Guides as required.       DISEASE/MEDICATION-SPECIFIC INFORMATION   3     For patients on injectable medications: Patient currently has 0 doses left.  Next injection is scheduled for 3/21.    SPECIALTY MEDICATION ADHERENCE     Medication Adherence    Patient reported X missed doses in the last month: 0  Specialty Medication: HUMIRA(CF) PEN 40 mg/0.4 mL  Patient is on additional specialty medications: No              Were doses missed due to medication being on hold? No    Humira 40/0.4 mg/ml: 0 days of medicine on hand        REFERRAL TO PHARMACIST     Referral to the pharmacist: Not needed      [facility]     Shipping address confirmed in Epic.     Patient was notified of new phone menu : No    Delivery Scheduled: Yes, Expected medication delivery date: 03/23/22.     Medication will be delivered via UPS to the prescription address in Epic WAM.    Willette Pa   [facility] Specialty Technician

## 2022-03-22 LAB — C-REACTIVE PROTEIN: C-REACTIVE PROTEIN: <1 mg/L (ref 0–10)

## 2022-03-22 LAB — CBC
HEMATOCRIT: 42 % (ref 37.5–51.0)
HEMOGLOBIN: 13.7 g/dL (ref 13.0–17.7)
MEAN CORPUSCULAR HEMOGLOBIN CONC: 32.6 g/dL (ref 31.5–35.7)
MEAN CORPUSCULAR HEMOGLOBIN: 30.6 pg (ref 26.6–33.0)
MEAN CORPUSCULAR VOLUME: 94 fL (ref 79–97)
PLATELET COUNT: 308 10*3/uL (ref 150–450)
RED BLOOD CELL COUNT: 4.48 x10E6/uL (ref 4.14–5.80)
RED CELL DISTRIBUTION WIDTH: 14.5 % (ref 11.6–15.4)
WHITE BLOOD CELL COUNT: 5.9 10*3/uL (ref 3.4–10.8)

## 2022-03-22 LAB — ALT: ALT (SGPT): 19 IU/L (ref 0–44)

## 2022-03-22 MED FILL — HUMIRA PEN CITRATE FREE 40 MG/0.4 ML: SUBCUTANEOUS | 28 days supply | Qty: 2 | Fill #3

## 2022-03-26 NOTE — Unmapped (Signed)
03/26/22-had to send replacement package on Humira [facility] Approved ) due to delay in UPS delivering med will deliver:03/20-CB

## 2022-03-27 MED FILL — HUMIRA PEN CITRATE FREE 40 MG/0.4 ML: SUBCUTANEOUS | 28 days supply | Qty: 2 | Fill #4

## 2022-04-18 NOTE — Unmapped (Signed)
[facility] Refill Coordination Note    Specialty Medication(s) to be Shipped:   Inflammatory Disorders: Humira    Other medication(s) to be shipped: No additional medications requested for fill at this time     Dakota Townsend, DOB: 10-Jun-1980  Phone: [phone] (home)       All above HIPAA information was verified with patient.     Was a Nurse, learning disability used for this call? No    Completed refill call assessment today to schedule patient's medication shipment from the [facility] [phone]).  All relevant notes have been reviewed.     Specialty medication(s) and dose(s) confirmed: Regimen is correct and unchanged.   Changes to medications: Robben reports no changes at this time.  Changes to insurance: No  New side effects reported not previously addressed with a pharmacist or physician: None reported  Questions for the pharmacist: No    Confirmed patient received a Conservation officer, historic buildings and a Surveyor, mining with first shipment. The patient will receive a drug information handout for each medication shipped and additional FDA Medication Guides as required.       DISEASE/MEDICATION-SPECIFIC INFORMATION        For patients on injectable medications: Patient currently has 0 doses left.  Next injection is scheduled for 4/18.    SPECIALTY MEDICATION ADHERENCE     Medication Adherence    Patient reported X missed doses in the last month: 0  Specialty Medication: HUMIRA(CF) PEN 40 mg/0.4 mL  Patient is on additional specialty medications: No              Were doses missed due to medication being on hold? No    Humira 40/0.4 mg/ml: 0 days of medicine on hand        REFERRAL TO PHARMACIST     Referral to the pharmacist: Not needed      [facility]     Shipping address confirmed in Epic.       Delivery Scheduled: Yes, Expected medication delivery date: 04/20/22.     Medication will be delivered via UPS to the prescription address in Epic WAM.    Willette Pa   [facility] Specialty Technician

## 2022-04-19 MED FILL — HUMIRA PEN CITRATE FREE 40 MG/0.4 ML: SUBCUTANEOUS | 28 days supply | Qty: 2 | Fill #5

## 2022-04-29 MED ORDER — LOPERAMIDE 2 MG CAPSULE
ORAL_CAPSULE | 5 refills | 0 days
Start: 2022-04-29 — End: ?

## 2022-04-30 MED ORDER — LOPERAMIDE 2 MG CAPSULE
ORAL_CAPSULE | 5 refills | 0 days | Status: CP
Start: 2022-04-30 — End: ?

## 2022-05-07 DIAGNOSIS — K50912 Crohn's disease, unspecified, with intestinal obstruction: Principal | ICD-10-CM

## 2022-05-11 DIAGNOSIS — K50912 Crohn's disease, unspecified, with intestinal obstruction: Principal | ICD-10-CM

## 2022-05-11 MED ORDER — HUMIRA PEN CITRATE FREE 40 MG/0.4 ML
SUBCUTANEOUS | 2 refills | 28 days | Status: CP
Start: 2022-05-11 — End: ?
  Filled 2022-05-15: qty 2, 28d supply, fill #0

## 2022-05-11 NOTE — Unmapped (Signed)
Last visit:12/28/21    Next visit:07/05/22    Labs:03/21/22    Medication refilled: Humira refill x48m

## 2022-05-11 NOTE — Unmapped (Signed)
[facility] Refill Coordination Note    Specialty Medication(s) to be Shipped:   Inflammatory Disorders: Humira    Other medication(s) to be shipped: No additional medications requested for fill at this time     Dakota Townsend, DOB: [birth year]  Phone: [phone] (home)       All above HIPAA information was verified with patient.     Was a Nurse, learning disability used for this call? No    Completed refill call assessment today to schedule patient's medication shipment from the [facility] [phone]).  All relevant notes have been reviewed.     Specialty medication(s) and dose(s) confirmed: Regimen is correct and unchanged.   Changes to medications: Ahmere reports no changes at this time.  Changes to insurance: No  New side effects reported not previously addressed with a pharmacist or physician: None reported  Questions for the pharmacist: No    Confirmed patient received a Conservation officer, historic buildings and a Surveyor, mining with first shipment. The patient will receive a drug information handout for each medication shipped and additional FDA Medication Guides as required.       DISEASE/MEDICATION-SPECIFIC INFORMATION        For patients on injectable medications: Patient currently has 0 doses left.  Next injection is scheduled for 5/16.    SPECIALTY MEDICATION ADHERENCE     Medication Adherence    Patient reported X missed doses in the last month: 0  Specialty Medication: HUMIRA(CF) PEN 40 mg/0.4 mL  Patient is on additional specialty medications: No              Were doses missed due to medication being on hold? No    Humira 40/0.4 mg/ml: 0 days of medicine on hand        REFERRAL TO PHARMACIST     Referral to the pharmacist: Not needed      [facility]     Shipping address confirmed in Epic.       Delivery Scheduled: Yes, Expected medication delivery date: 05/16/22.  However, Rx request for refills was sent to the provider as there are none remaining.     Medication will be delivered via UPS to the prescription address in Epic WAM.    Willette Pa   [facility] Specialty Technician

## 2022-06-07 NOTE — Unmapped (Signed)
[facility] Refill Coordination Note    Specialty Medication(s) to be Shipped:   Inflammatory Disorders: Humira    Other medication(s) to be shipped: No additional medications requested for fill at this time     Dakota Townsend, DOB: [birth year]  Phone: [phone] (home)       All above HIPAA information was verified with patient.     Was a Nurse, learning disability used for this call? No    Completed refill call assessment today to schedule patient's medication shipment from the [facility] [phone]).  All relevant notes have been reviewed.     Specialty medication(s) and dose(s) confirmed: Regimen is correct and unchanged.   Changes to medications: Edwards reports no changes at this time.  Changes to insurance: No  New side effects reported not previously addressed with a pharmacist or physician: None reported  Questions for the pharmacist: No    Confirmed patient received a Conservation officer, historic buildings and a Surveyor, mining with first shipment. The patient will receive a drug information handout for each medication shipped and additional FDA Medication Guides as required.       DISEASE/MEDICATION-SPECIFIC INFORMATION        For patients on injectable medications: Patient currently has 1 doses left.  Next injection is scheduled for 5/30.    SPECIALTY MEDICATION ADHERENCE     Medication Adherence    Patient reported X missed doses in the last month: 0  Specialty Medication: HUMIRA(CF) PEN 40 mg/0.4 mL  Patient is on additional specialty medications: No              Were doses missed due to medication being on hold? No    Humira 40/0.4 mg/ml: 1 days of medicine on hand        REFERRAL TO PHARMACIST     Referral to the pharmacist: Not needed      [facility]     Shipping address confirmed in Epic.       Delivery Scheduled: Yes, Expected medication delivery date: 06/13/22.     Medication will be delivered via UPS to the prescription address in Epic WAM.    Willette Pa   [facility] Specialty Technician

## 2022-06-12 MED FILL — HUMIRA PEN CITRATE FREE 40 MG/0.4 ML: SUBCUTANEOUS | 28 days supply | Qty: 2 | Fill #1

## 2022-07-04 NOTE — Unmapped (Signed)
REASON FOR VISIT:  Crohn's    HISTORY OF PRESENT ILLNESS:  Since last visit,  03/21/2022, CBC, ALT, CRP normal    Tolerating 6mp and humira well.  75% adherent to 6mp. 100% adherent to humira. Has daily nausea x 2 months. Rare vomiting. No new meds at that time. 2-4 mushy to formed bms per day without blood.  Takes loperamide 6 pills per day.    MEDICATIONS:  has a current medication list which includes the following prescription(s): acetaminophen, albuterol, alprazolam, buprenorphine-naloxone, chlorhexidine, cyanocobalamin (vitamin b-12), divalproex er, empty container, ferrous sulfate, humira(cf) pen, humira(cf) pen [id number]-[id number], lamotrigine, levalbuterol, loperamide, mercaptopurine, methocarbamol, naproxen, promethazine, and UNABLE TO FIND.    ALLERGIES:  Allergies as of 07/05/2022 - Reviewed 06/07/2022   Allergen Reaction Noted    Acetaminophen Nausea And Vomiting 04/27/2012    Benadryl [diphenhydramine hcl] Anaphylaxis 04/27/2012    Sulfa (sulfonamide antibiotics) Hives 04/27/2012    Rozerem [ramelteon] Other (See Comments) 05/11/2014    Shellfish containing products Diarrhea and Nausea And Vomiting 06/04/2011       PAST MEDICAL HISTORY:  1. Crohn's disease diagnosed as ileocolitis in 2007, treated initially with Pentasa and prednisone. Infliximab with partial response. Imuran with no help. Infusion reaction during infliximab retreatment. Adalimumab provided no benefit. Certolizumab with mild improvement but plateaued despite once weekly  dosing. In 12/2007, CT with ileitis, internal fistula and abscess. 02/04/08, ielocolic resection (40cm ti) by Dr. Saxton Sever in [place]. 07/2008, diarrhea. 05/03/09, thickening of the neoterminal ileum on CT scan. 6-MP started 50 mg per day. 05/03/09, start adalimumab. 06/01/09, 6-MP increased to 75 mg. 07/19/09, desipramine to 100 mg per day without benefit. 11/10/2009, 6 inches of ileal resection (stricture) with primary anastomosis. 12/20/2009, reluctant to restart immunosuppression. 03/21/10, stopped desipramine due to lack of benefit and start Cymbalta. 05/25/10 Colonoscopy with i3 recurrence. Restart 6-MP 75mg , but declined adalimumab due to toe infections. 03/01/11, 6 inches of small bowel resected just proximal to the anastomosis. 05/22/11 start oral methotrexate. 06/04/11, CT abdomen and pelvis showed small-bowel dilation to 3.4 cm, prominent thickening of the ileocolonic anastomosis. Start ustekinumab. 10/26/11, colonoscopy showed minimal improvement. MTX switched to 25mg  Ridgeside.  02/20/13, colonoscopy showed I2 disease (possibly mildly improved).  Patient self stopped methotrexate due to perceived increase infectious complications.  Continued ustekinumab monotherapy.  2017, stopped ustekinumab due to incarceration.  2018, worsening symptoms, start adalimumab in prison with improvement.  08/2019, stopped adalimumab upon release from prison.  Worsening symptoms. 09/08/2020, start 6mp 75mg  daily. 12/06/2020, start adalimumab. 07/27/21, strep intermedius empyema of the left lung. and adalimumab stopped. 11/21/2021, resume 6mp 75mg . 12/06/2021, resume adalimumab.  2. 06/2014, pilonidal cyst excision.  05/01/2021, pilonidal cyst excision.  12/06/2021, resume adalimumab.  3. Bipolar disorder  4. Substance use disorder  5. 09/08/20, PCV-20  6.  09/2020, QuantiFERON gold positive. 10/28/2020, [facility] health department determined patient did not need treatment for LTBI based on 2 repeat negative tests.  7.  02/09/2021, Shingrix No. 1, #2 on 12/28/21    SOCIAL HISTORY:  Quit smoking 3 weeks ago    FAMILY HISTORY:  No colon cancer    PHYSICAL EXAM:  BP 107/79  - Pulse 84  - Temp 36.9 ??C (98.4 ??F)  - Ht 181 cm (5' 11.26)  - Wt 70.3 kg (155 lb)  - BMI 21.46 kg/m??   Wt Readings from Last 4 Encounters:   07/05/22 70.3 kg (155 lb)   12/28/21 71.7 kg (158 lb)   09/19/21 66.6 kg (146 lb 14.4 oz)  07/18/21 63.5 kg (140 lb)     CONSTITUTIONAL: awake, alert, NAD    TEST DATA:  1. 05/03/09, CT scan with thickening of neoterminal ileum and subcm mesenteric lymphadenopathy. No stranding, abscess, or fistula.   2.08/26/2009, EGD was normal.   3.08/26/2009, colonoscopy to the neoterminal ileum. Poor bowel prep. Edema, erythema and shallow ulcerations at ICA and neoterminal ileum. Benign-appearing mild stenosis just proximal to ICA that was not traversed.  4. 10/28/2009, small bowel follow through showed 4-hour transit time, adherent small bowel loops in the pelvis that were difficult to separate, 1.5-2 cm segment of stricture in the neoterminal ileum that was 4 mm in diameter, more proximal 2.25 cm of neoterminal ileum was mildly narrowed.   5. 05/20/10, colonoscopy to neoterminal ileum with poor prep, i3 recurrence.   6. 02/07/2011, colonoscopy to 5 cm in the terminal ileum showed a linear ulcer at the ileocolonic anastomosis and severe ulceration in the neoterminal ileum that was worse than before. Terminal ileum could not be intubated more deeply due to edema and/or stricturing.   7. 06/04/11, CT abdomen and pelvis showed small-bowel dilatation to 3.4 cm, gas and stool in colon, prominent thickening at the ileocolonic anastomosis  8. 10/26/11 Colonoscopy- ICA with linear ulceration confined to the anastamotic line. >5 medium sized aphthous ulcers with normal intervening mucosa in the neoti. Slightly improved compared to prior pre-op colonoscopy.   9. 02/20/13, colonoscopy. Normal ICA. i2 disease in the distal 6 cm of TI.  Slightly improved.  10. 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  09/12/2020, CTAP.  Gallbladder wall thickening.  Marked thickening of the terminal ileum x20 cm.  2 cm mesenteric lymphadenopathy  12.  09/12/2020, right upper quadrant ultrasound.  Gallbladder sludge, wall thickening.  13.  09/14/2020, HIDA scan.  Normal.  14. 07/18/2021, colonoscopy to TI x1 cm.  Fair prep.  Nondraining sinus tract versus fistula on perianal exam.  Edema, small superficial ulcers and mild stenosis (1 cm) at ileocolonic anastomosis.  Dilated to 13.5 mm.  Anastomosis was traversed from, but deeper intubation of the ileum was prevented by looping.     ASSESSMENT:  1. Ileal Crohn's disease.  Stricturing phenotype.  Currently seems to be in symptomatic remission on combination adalimumab and 6-MP.  Reviewed risks including risks of infection, malignancy, hepatitis, cytopenias.  These medicines require intensive monitoring for toxicity.  Restage Crohn's disease activity with CTE.  If significantly active disease, then optimize drug dosing.   Check serum adalimumab level today and encourage adherence to 6mp.  2. 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  Continue for now  3. Recurrent nausea. Could be medication induced but not sure which ones. Try stopping oral iron and then 6mp for 1 week to see if it resolves. Use ondansetron as needed.  4.  Medically immune suppressed, in need of vaccination.       RECOMMENDATIONS AND PLAN:  Patient Instructions   -Continue Humira every 2 weeks and 75mg  daily  -Bloodwork today.  -Bloodwork every 3 months at [facility].  -Continue efforts to maintain cessation of tobacco use  -Stop oral iron.  -Try stopping 6mp x 1 week.  If nausea improves, then let me know.  If nausea is no different, then resume 6mp.  -CT scan to revaluate Crohn's disease.  You will be contacted to schedule.  -Flu shot each October  -Try taking ondansetron for nausea instead of  promethazine since ondansetron doesn't cause drowsiness.  I'll send in rx.  -Clinic with me in 6 months or sooner if needed.

## 2022-07-05 ENCOUNTER — Ambulatory Visit: Admit: 2022-07-05 | Discharge: 2022-07-06 | Payer: MEDICARE | Attending: Gastroenterology | Primary: Gastroenterology

## 2022-07-05 DIAGNOSIS — K50912 Crohn's disease, unspecified, with intestinal obstruction: Principal | ICD-10-CM

## 2022-07-05 LAB — CBC
HEMATOCRIT: 39.7 % (ref 39.0–48.0)
HEMOGLOBIN: 13.6 g/dL (ref 12.9–16.5)
MEAN CORPUSCULAR HEMOGLOBIN CONC: 34.3 g/dL (ref 32.0–36.0)
MEAN CORPUSCULAR HEMOGLOBIN: 32 pg (ref 25.9–32.4)
MEAN CORPUSCULAR VOLUME: 93.2 fL (ref 77.6–95.7)
MEAN PLATELET VOLUME: 8.9 fL (ref 6.8–10.7)
PLATELET COUNT: 278 10*9/L (ref 150–450)
RED BLOOD CELL COUNT: 4.26 10*12/L (ref 4.26–5.60)
RED CELL DISTRIBUTION WIDTH: 14.5 % (ref 12.2–15.2)
WBC ADJUSTED: 5.4 10*9/L (ref 3.6–11.2)

## 2022-07-05 LAB — ALT: ALT (SGPT): 16 U/L (ref 10–49)

## 2022-07-05 LAB — C-REACTIVE PROTEIN: C-REACTIVE PROTEIN: <4 mg/L (ref <=10.0)

## 2022-07-05 MED ORDER — MERCAPTOPURINE 50 MG TABLET
ORAL_TABLET | 0 refills | 0 days | Status: CP
Start: 2022-07-05 — End: ?

## 2022-07-05 MED ORDER — ONDANSETRON 4 MG DISINTEGRATING TABLET
ORAL_TABLET | Freq: Three times a day (TID) | 5 refills | 30 days | Status: CP | PRN
Start: 2022-07-05 — End: ?

## 2022-07-05 NOTE — Unmapped (Addendum)
-  Continue Humira every 2 weeks and 75mg  daily  -Bloodwork today.  -Bloodwork every 3 months at [facility].  -Continue efforts to maintain cessation of tobacco use  -Stop oral iron.  -Try stopping 6mp x 1 week.  If nausea improves, then let me know.  If nausea is no different, then resume 6mp.  -CT scan to revaluate Crohn's disease.  You will be contacted to schedule.  -Flu shot each October  -Try taking ondansetron for nausea instead of promethazine since ondansetron doesn't cause drowsiness.  I'll send in rx.  -Clinic with me in 6 months or sooner if needed.

## 2022-07-05 NOTE — Unmapped (Signed)
[facility] Refill Coordination Note    Specialty Medication(s) to be Shipped:   Inflammatory Disorders: Humira 40mg /0.66ml    Other medication(s) to be shipped: No additional medications requested for fill at this time     Dakota Townsend, DOB: Apr 04, 1980  Phone: [phone] (home)       All above HIPAA information was verified with patient.     Was a Nurse, learning disability used for this call? No    Completed refill call assessment today to schedule patient's medication shipment from the [facility] [phone]).  All relevant notes have been reviewed.     Specialty medication(s) and dose(s) confirmed: Regimen is correct and unchanged.   Changes to medications: [place] reports no changes at this time.  Changes to insurance: No  New side effects reported not previously addressed with a pharmacist or physician: None reported  Questions for the pharmacist: No    Confirmed patient received a Conservation officer, historic buildings and a Surveyor, mining with first shipment. The patient will receive a drug information handout for each medication shipped and additional FDA Medication Guides as required.       DISEASE/MEDICATION-SPECIFIC INFORMATION        For patients on injectable medications: Patient currently has 1 doses left.  Next injection is scheduled for 07/05/22.    SPECIALTY MEDICATION ADHERENCE     Medication Adherence    Patient reported X missed doses in the last month: 0  Specialty Medication: HUMIRA(CF) PEN 40 mg/0.4 mL  Patient is on additional specialty medications: No  Informant: patient              Were doses missed due to medication being on hold? No    Humira 40  mg/0.70ml : 14 days of medicine on hand       REFERRAL TO PHARMACIST     Referral to the pharmacist: Not needed      [facility]     Shipping address confirmed in Epic.       Delivery Scheduled: Yes, Expected medication delivery date: 07/10/22.     Medication will be delivered via UPS to the prescription address in Epic WAM.    Jasper Loser   [facility] Specialty Technician

## 2022-07-09 LAB — ADALIMUMAB/ADALIMUMAB AB: ADALIMUMAB: 13.3 ug/mL

## 2022-07-09 MED FILL — HUMIRA PEN CITRATE FREE 40 MG/0.4 ML: SUBCUTANEOUS | 28 days supply | Qty: 2 | Fill #2

## 2022-07-24 IMAGING — CR DG CHEST 2V
2 series · 2 of 2 positions shown · non-contrast
Comparison: 08/17/2015

CLINICAL DATA: Positive TB test

EXAM:
CHEST - 2 VIEW

[w chest pa]
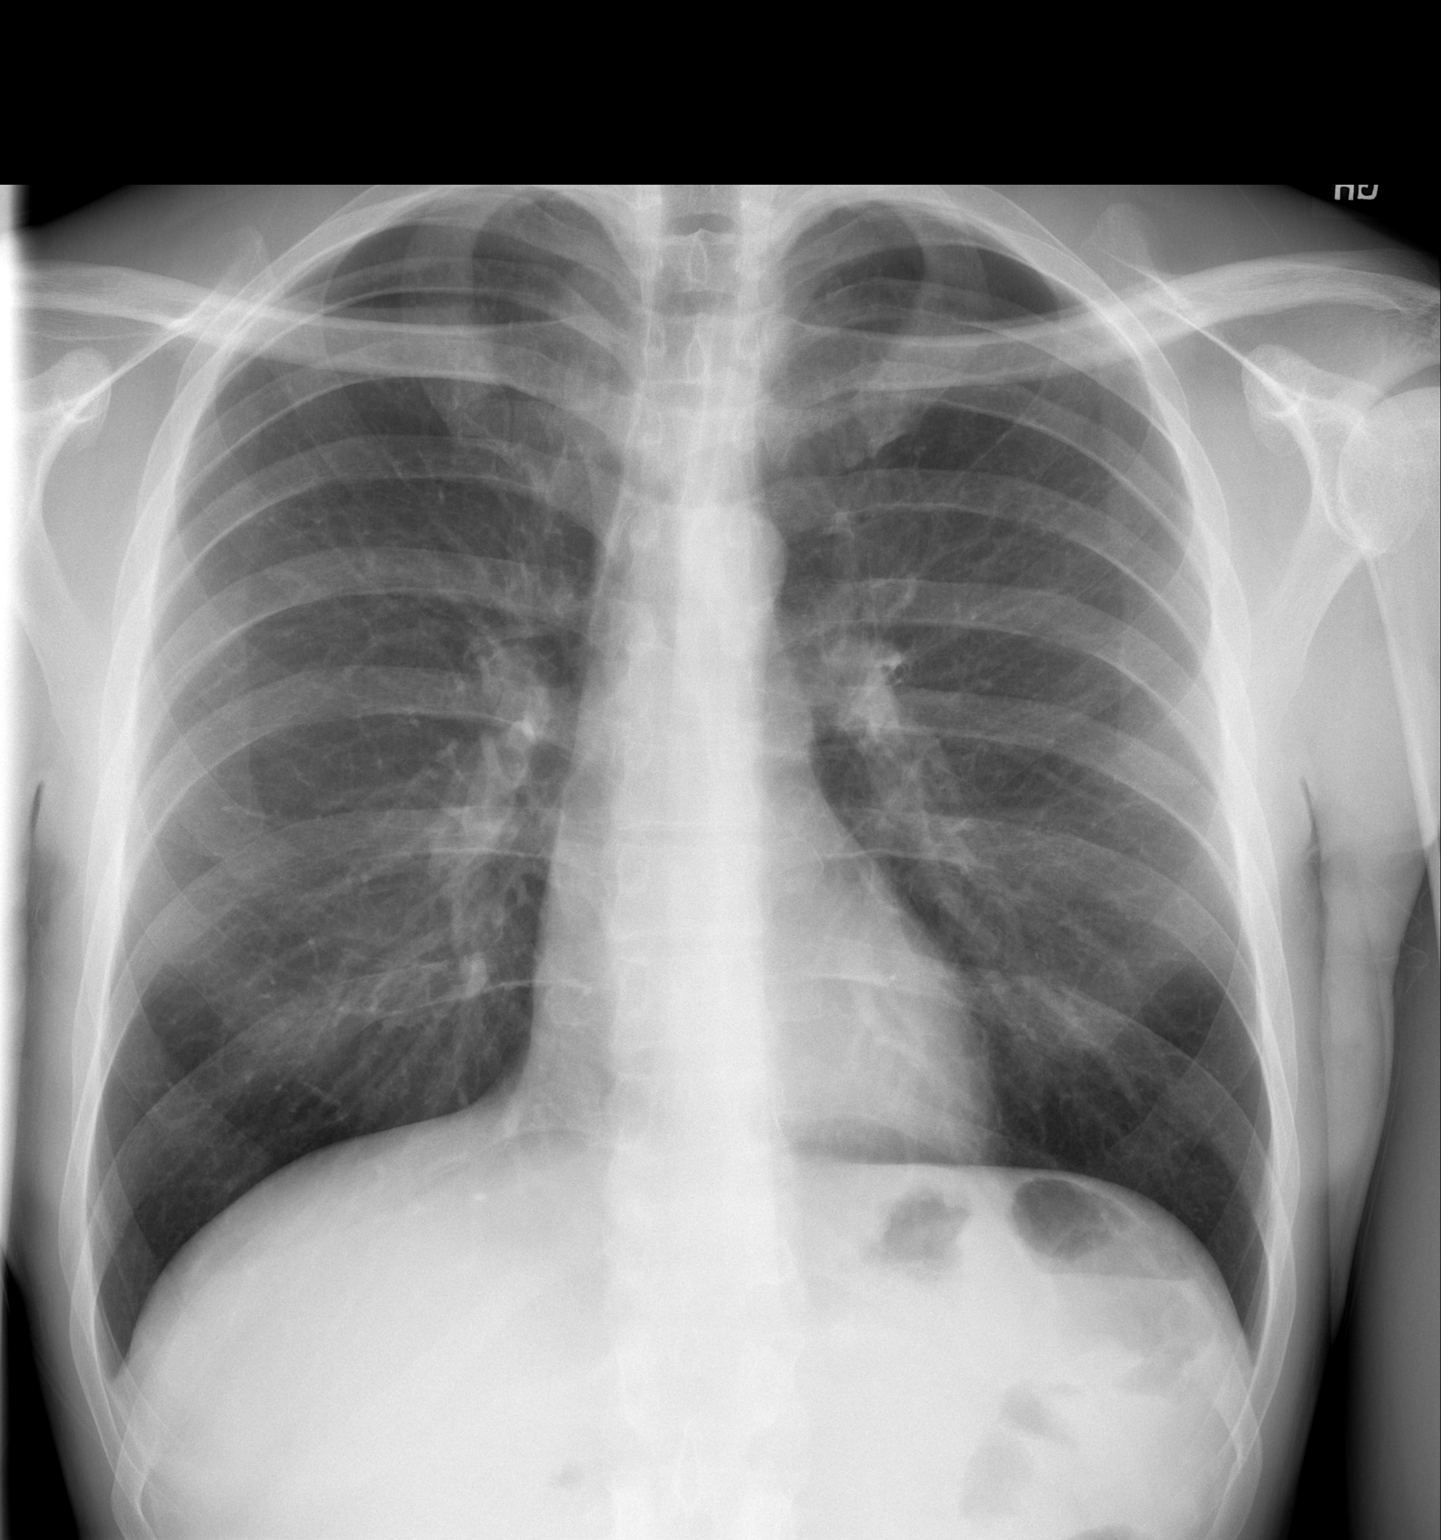

[w chest lat]
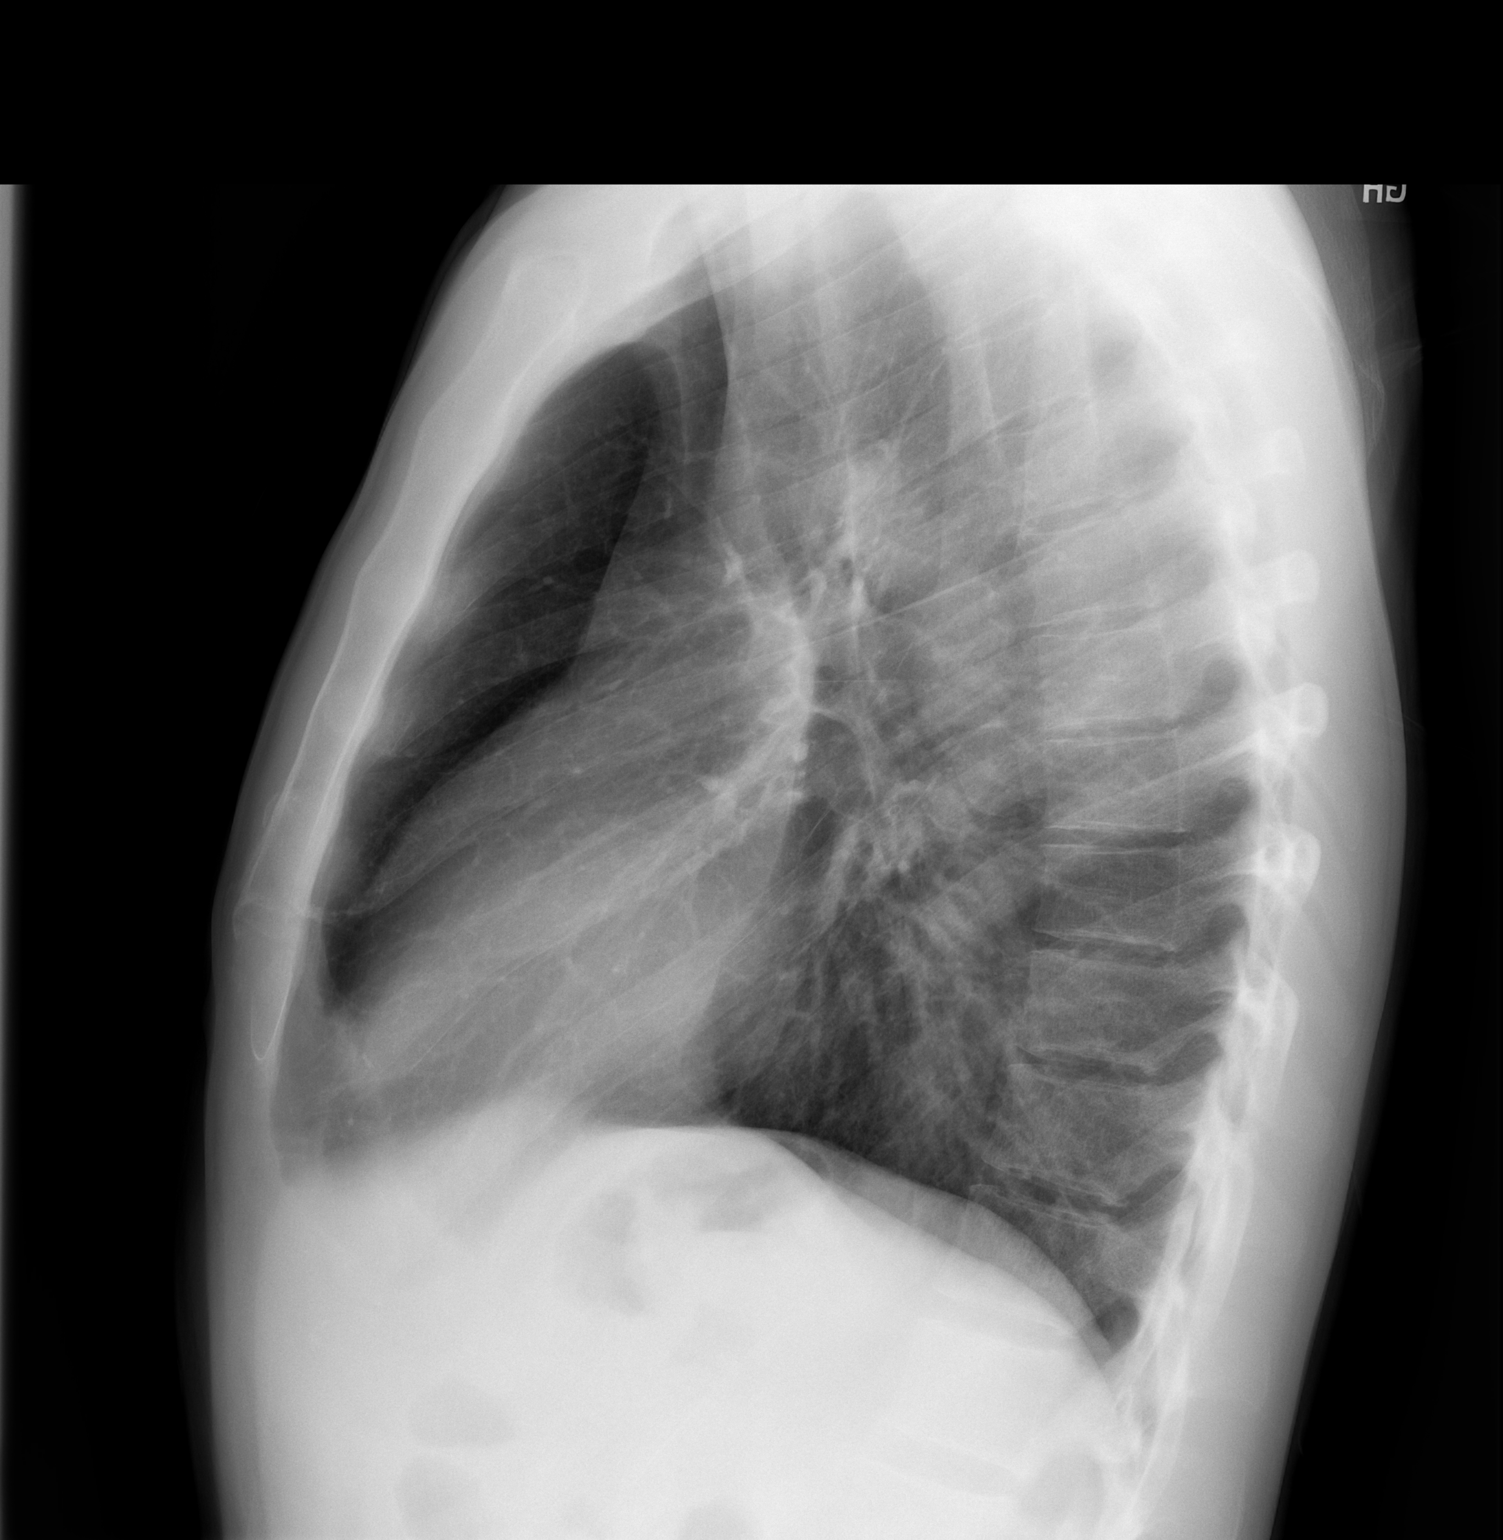

[2 of 2 positions shown; findings below may reference images not displayed]

FINDINGS: The heart size and mediastinal contours are within normal limits.
Both lungs are clear. The visualized skeletal structures are
unremarkable.
IMPRESSION: No active cardiopulmonary disease.

## 2022-07-29 ENCOUNTER — Ambulatory Visit: Admit: 2022-07-29 | Discharge: 2022-07-30 | Payer: MEDICARE

## 2022-07-29 DIAGNOSIS — K56609 Unspecified intestinal obstruction, unspecified as to partial versus complete obstruction: Secondary | ICD-10-CM | POA: Diagnosis not present

## 2022-07-29 DIAGNOSIS — K50912 Crohn's disease, unspecified, with intestinal obstruction: Secondary | ICD-10-CM | POA: Diagnosis not present

## 2022-07-29 DIAGNOSIS — K5090 Crohn's disease, unspecified, without complications: Secondary | ICD-10-CM | POA: Diagnosis not present

## 2022-07-29 MED ADMIN — iohexol (OMNIPAQUE) 350 mg iodine/mL solution 100 mL: 100 mL | INTRAVENOUS | @ 17:00:00 | Stop: 2022-07-29 | NDC 00407141498

## 2022-07-30 DIAGNOSIS — K50912 Crohn's disease, unspecified, with intestinal obstruction: Principal | ICD-10-CM

## 2022-07-31 NOTE — Unmapped (Signed)
Reviewed CT results that showed long segment 20 cm of significant ileal inflammation.  Continues to have chronic abdominal pain and intermittent nausea and vomiting that wakes him from sleep now.  Weighs 152 lbs now. Self stopped several weeks ago because he was feeling ill. Continues to take Humira once every 2 weeks.    Discussed upadacitinib, risankizumab, ustekinumab including risks and benefits.  Patient decided on upadacitinib.  Reviewed risks including infection, malignancy, heart attacks, strokes, blood clots, cytopenias, liver inflammation.      Recommend:  -Stop Humira and 6mp  -Upadacitinib 45mg  by mouth once daily  -Bloodwork at [facility] in 2 weeks and then every 12 weeks thereafter.  -Repeat CT scan at about 16 weeks to gauge response.

## 2022-08-01 DIAGNOSIS — K50912 Crohn's disease, unspecified, with intestinal obstruction: Principal | ICD-10-CM

## 2022-08-01 MED ORDER — RINVOQ 45 MG TABLET,EXTENDED RELEASE
ORAL_TABLET | Freq: Every day | ORAL | 0 refills | 84 days | Status: CP
Start: 2022-08-01 — End: ?

## 2022-08-01 MED ORDER — UPADACITINIB ER 30 MG TABLET,EXTENDED RELEASE 24 HR
ORAL_TABLET | Freq: Every day | ORAL | 0 refills | 90 days | Status: CP
Start: 2022-08-01 — End: ?
  Filled 2022-08-07: qty 28, 28d supply, fill #0

## 2022-08-01 NOTE — Unmapped (Signed)
Per Dr. Stevphen Rochester, no need for repeat TB screening.

## 2022-08-01 NOTE — Unmapped (Addendum)
MEDICATION ORDER ENTRY DOCUMENTATION    The following medication(s) have been sent to pharmacy for insurance authorization:    Medication: Upadacitinib  Dose & Frequency:standard crohns dosing loading and maitenance  Pharmacy: [facility]    TB Check: Yes Cleared by local health dept in 2022 after +quant gold result. Will confirm ok to proceed with Dr. Stevphen Rochester vs need to rescreen      Requesting Physician: Dr. Noel Gerold    Notes:  added to complete pro    Carolyn Stare, RN  [phone] office

## 2022-08-02 DIAGNOSIS — K50912 Crohn's disease, unspecified, with intestinal obstruction: Principal | ICD-10-CM

## 2022-08-03 NOTE — Unmapped (Signed)
[facility] Specialty Medication Onboarding    Specialty Medication: Rinvoq  Prior Authorization: Approved   Financial Assistance: No - copay  <$25  Final Copay/Day Supply: $0 / 28 days (LD)          $0 / 30 days (MD)    Insurance Restrictions: Yes - max 1 month supply     Notes to Pharmacist:   Credit Card on File: not applicable    The triage team has completed the benefits investigation and has determined that the patient is able to fill this medication at [facility]. Please contact the patient to complete the onboarding or follow up with the prescribing physician as needed.

## 2022-08-06 NOTE — Unmapped (Signed)
[facility]   Patient Onboarding/Medication Counseling    Switch from Humira/6MP --> Rinvoq due to increased symptoms/inflammation on recent CT (and tolerability issues w/6-MP)    Mr.Dakota Townsend is a 42 y.o. male with Chrons who I am counseling today on initiation of therapy.  I am speaking to the patient.    Was a Nurse, learning disability used for this call? No    Verified patient's date of birth / HIPAA.    Specialty medication(s) to be sent: Inflammatory Disorders: Rinvoq      Non-specialty medications/supplies to be sent: n/a      Medications not needed at this time: n/a         The patient declined counseling on missed dose instructions, goals of therapy, side effects and monitoring parameters, warnings and precautions, drug/food interactions, and storage, handling precautions, and disposal because they were counseled in clinic. The information in the declined sections below are for informational purposes only and was not discussed with patient.     We did go over administration and dosing schedule (45mg  x12 weeks, then 30mg  thereafter)      Rinvoq (upadacitinib)    Medication & Administration     Dosage:  Crohn disease: Oral: Induction: 45 mg once daily for 12 weeks; maintenance: 15 mg once daily; may increase to 30 mg once daily in patients with refractory, severe, or extensive disease.   - Pt will be on 30mg  maintenance after 45mg  induction    Lab tests required prior to treatment initiation:  Tuberculosis: Tuberculosis screening resulted in a non-reactive Quantiferon TB Gold assay. (Completed 09/08/20, Dr. Stevphen Rochester ok'd onboard w/o re-test)  Hepatitis B: Hepatitis B serology studies are complete and non-reactive. (Completed 09/08/20, Dr. Stevphen Rochester ok'd onboard w/o re-test)  Absolute lymphocyte count: ALC greater than 500/mm3. (Completed 07/27/21)  Absolute neutrophil count: ANC greater than 1,000/mm3. (Completed 07/27/21)  Hemoglobin: Hemoglobin is greater than 8 g/dL. (Completed 07/05/22)  Liver function tests: Baseline ALT and AST were evaluated and are documented in the patient chart prior to treatment initiation. (Completed AST 07/26/21. ALT 07/05/22)  Pregnancy: Patient is male: screening not applicable.      Administration: Take tablets with or without food.  Swallow tablets whole; do not chew, break, or crush.    Adherence/Missed dose instructions:  Take a missed dose as soon as you think about it.  If it has been more than 8-12 hours since your normal dosing time, skip the missed dose and go back to your normal time the following day.  Never take 2 doses at the same time or extra doses in an attempt to 'catch up' after a missed dose.    Goals of Therapy     Achieve symptom remission  Slow disease progression  Protection of remaining articular structures  Maintenance of function  Maintenance of effective psychosocial functioning    Side Effects & Monitoring Parameters     Upset stomach  Signs of a common cold - minor sore throat, runny or stuffy nose, etc.    The following side effects should be reported to the provider:  Signs of a hypersensitivity reaction - rash; hives; itching; red, swollen, blistered, or peeling skin; wheezing; tightness in the chest or throat; difficulty breathing, swallowing, or talking; swelling of the mouth, face, lips, tongue, or throat; etc.  Reduced immune function - report signs of infection such as fever; chills; body aches; very bad sore throat; ear or sinus pain; cough; more sputum or change in color of sputum; pain with  passing urine; wound that will not heal, etc.  Also at a slightly higher risk of some malignancies (mainly skin and blood cancers) due to this reduced immune function.  A new skin growth or lump  Stomach pain that is new or worse or change in bowel habits  Signs of a blood clot - chest pain or pressure; coughing up blood; shortness of breath; swelling, warmth, numbness, or pain in a leg or arm  Signs of MACE - stroke: trouble walking, speaking, and understanding, as well as paralysis or numbness of the face, arm, or leg; MI: tightness or pain in the chest, neck, back, or arms, as well as fatigue, lightheadedness, abnormal heartbeat, and anxiety      Contraindications, Warnings, & Precautions     Have your bloodwork checked as you have been told by your prescriber  Talk with your doctor if you are pregnant, planning to become pregnant, or breastfeeding - women taking this medication must use birth control while taking this drug and for some time after the last dose  Discuss the possible need for holding your dose(s) of Rinvoq?? when a planned procedure is scheduled with the prescriber as it may delay healing/recovery timeline       Drug/Food Interactions     Medication list reviewed in Epic. The patient was instructed to inform the care team before taking any new medications or supplements. No drug interactions identified.   Talk with you prescriber or pharmacist before receiving any live vaccinations while taking this medication and after you stop taking it    Storage, Handling Precautions, & Disposal     Store in the original container at room temperature and protect from moisture.      Current Medications (including OTC/herbals), Comorbidities and Allergies     Current Outpatient Medications   Medication Sig Dispense Refill    acetaminophen (TYLENOL) 500 MG tablet Take 2 tablets (1,000 mg total) by mouth every four (4) hours as needed.      albuterol HFA 90 mcg/actuation inhaler Inhale 2 puffs every four (4) hours as needed.      ALPRAZolam (XANAX) 0.25 MG tablet Take 1 tablet (0.25 mg total) by mouth daily as needed.      buprenorphine-naloxone (SUBOXONE) 8-2 mg sublingual film TAKE ONE FILM DAILY      chlorhexidine (PERIDEX) 0.12 % solution RINSE WITH 15 ML FOR 30 SECONDS TWO TIMES DAILY IN AM AND PM (SPIT AFTER USE)--USE ONLY TWO WEEKS      cyanocobalamin, vitamin B-12, 1000 MCG tablet Take 1 tablet (1,000 mcg total) by mouth daily. Taking      divalproex ER (DEPAKOTE ER) 500 MG extended released 24 hr tablet TAKE 2 TABLETS BY MOUTH WITH SUPPER DAILY      empty container Misc Use as directed with injectable medication 1 each 0    ferrous sulfate 325 (65 FE) MG tablet Take 1 tablet (325 mg total) by mouth daily.      lamoTRIgine (LAMICTAL) 200 MG tablet Take 1 tablet (200 mg total) by mouth.      levalbuterol (XOPENEX HFA) 45 mcg/actuation inhaler Inhale 1 puff every four (4) hours as needed.      loperamide (IMODIUM) 2 mg capsule TAKE 1-2 CAPSULES BY MOUTH EVERY 4 HOURS AS NEEDED FOR DIARRHEA 120 capsule 5    methocarbamoL (ROBAXIN) 500 MG tablet Take 1 tablet (500 mg total) by mouth four (4) times a day.      naproxen (NAPROSYN) 250 MG tablet Take 1  tablet (250 mg total) by mouth Three (3) times a day as needed.      ondansetron (ZOFRAN-ODT) 4 MG disintegrating tablet Dissolve 1 tablet (4 mg total) in the mouth every eight (8) hours as needed for nausea. 90 tablet 5    promethazine (PHENERGAN) 25 MG tablet 0.5-1 tab po q 8hrs prn nausea 45 tablet 2    UNABLE TO FIND Take 1 tablet by mouth in the morning. 5-Htp.      upadacitinib (RINVOQ) 30 mg tablet Take 1 tablet (30 mg total) by mouth daily. To start after loading dose is complete 90 tablet 0    upadacitinib (RINVOQ) 45 mg Tb24 Take 1 tablet (45 mg total) by mouth daily. Induction dose for 12weeks. 84 tablet 0     No current facility-administered medications for this visit.       Allergies   Allergen Reactions    Acetaminophen Nausea And Vomiting    Benadryl [Diphenhydramine Hcl] Anaphylaxis    Sulfa (Sulfonamide Antibiotics) Hives    Rozerem [Ramelteon] Other (See Comments)     Pt reports hallucinations, muscle contortions  & dry eyes    Shellfish Containing Products Diarrhea and Nausea And Vomiting       Patient Active Problem List   Diagnosis    Crohn's disease of intestine (CMS-HCC)    Tobacco use disorder    Intestinal obstruction (CMS-HCC)    Intestinal bypass or anastomosis status    Nausea with vomiting Esophageal reflux    Major depression    Marijuana use    Chronic abdominal pain    Pilonidal cyst       Reviewed and up to date in Epic.    Appropriateness of Therapy     Acute infections noted within Epic:  No active infections  Patient reported infection: None    Is medication and dose appropriate based on diagnosis and infection status? Yes    Prescription has been clinically reviewed: Yes      Baseline Quality of Life Assessment      How many days over the past month did your Chrons  keep you from your normal activities? For example, brushing your teeth or getting up in the morning. Patient declined to answer    Financial Information     Medication Assistance provided: Prior Authorization    Anticipated copay of $0 reviewed with patient. Verified delivery address.    Delivery Information     Scheduled delivery date: 08/08/22    Expected start date: 08/08/22      Medication will be delivered via UPS to the prescription address in [facility].  This shipment will not require a signature.      Explained the services we provide at [facility] and that each month we would call to set up refills.  Stressed importance of returning phone calls so that we could ensure they receive their medications in time each month.  Informed patient that we should be setting up refills 7-10 days prior to when they will run out of medication.  A pharmacist will reach out to perform a clinical assessment periodically.  Informed patient that a welcome packet, containing information about our pharmacy and other support services, a Notice of Privacy Practices, and a drug information handout will be sent.      The patient or caregiver noted above participated in the development of this care plan and knows that they can request review of or adjustments to the care plan at any  time.      Patient or caregiver verbalized understanding of the above information as well as how to contact the pharmacy at [phone] option 4 with any questions/concerns.  The pharmacy is open Monday through Friday 8:30am-4:30pm.  A pharmacist is available 24/7 via pager to answer any clinical questions they may have.    Patient Specific Needs     Does the patient have any physical, cognitive, or cultural barriers? No    Does the patient have adequate living arrangements? (i.e. the ability to store and take their medication appropriately) Yes    Did you identify any home environmental safety or security hazards? No    Patient prefers to have medications discussed with  Patient     Is the patient or caregiver able to read and understand education materials at a high school level or above? Yes    Patient's primary language is  English     Is the patient high risk? No    SOCIAL DETERMINANTS OF HEALTH     At the [facility], we have learned that life circumstances - like trouble affording food, housing, utilities, or transportation can affect the health of many of our patients.   That is why we wanted to ask: are you currently experiencing any life circumstances that are negatively impacting your health and/or quality of life? Patient declined to answer    Social Determinants of Health     Financial Resource Strain: Low Risk  (08/28/2021)    Received from [facility], [facility]    Overall Financial Resource Strain (CARDIA)     Difficulty of Paying Living Expenses: Not hard at all   Internet Connectivity: Not on file   Food Insecurity: No Food Insecurity (08/28/2021)    Received from [facility], [facility]    Hunger Vital Sign     Worried About Running Out of Food in the Last Year: Never true     Ran Out of Food in the Last Year: Never true   Tobacco Use: Medium Risk (07/05/2022)    Patient History     Smoking Tobacco Use: Former     Smokeless Tobacco Use: Never     Passive Exposure: Not on file   Housing/Utilities: Not on file   Alcohol Use: Not on file   Transportation Needs: No Transportation Needs (08/28/2021)    Received from [facility], [facility] - Transportation     Lack of Transportation (Medical): No     Lack of Transportation (Non-Medical): No   Substance Use: Not on file   Health Literacy: Not on file   Physical Activity: Inactive (08/28/2021)    Received from [facility], [facility]    Exercise Vital Sign     Days of Exercise per Week: 0 days     Minutes of Exercise per Session: 0 min   Interpersonal Safety: Unknown (08/06/2022)    Interpersonal Safety     Unsafe Where You Currently Live: Not on file     Physically Hurt by Anyone: Not on file     Abused by Anyone: Not on file   Stress: No Stress Concern Present (08/28/2021)    Received from [facility], [facility]    [facility] of Occupational Health - Occupational Stress Questionnaire     Feeling of Stress : Not at all   Intimate Partner Violence: Not At Risk (08/28/2021)    Received from [facility], [facility]    Humiliation, Afraid, Rape, and Kick questionnaire  Fear of Current or Ex-Partner: No     Emotionally Abused: No     Physically Abused: No     Sexually Abused: No   Depression: Not on file   Social Connections: Moderately Integrated (08/28/2021)    Received from [facility], [facility]    Social Connection and Isolation Panel [NHANES]     Frequency of Communication with Friends and Family: More than three times a week     Frequency of Social Gatherings with Friends and Family: More than three times a week     Attends Religious Services: More than 4 times per year     Active Member of [employer] or Organizations: No     Attends Engineer, structural: More than 4 times per year     Marital Status: Never married       Would you be willing to receive help with any of the needs that you have identified today? Not applicable       Darryl Nestle, PharmD  [facility] Specialty Pharmacist

## 2022-08-10 NOTE — Unmapped (Signed)
Patient received rinvoq shipment on 08/08/2022 from [facility] [place].  Referral placed to IBD pharmacist for follow up. Patient notified.

## 2022-08-28 NOTE — Unmapped (Signed)
I reviewed this patient case and all documentation provided by the learner and was readily available for consultation during their interaction with the patient.  I agree with the assessment and plan listed below.    Teofilo Pod, PharmD   [facility] Specialty Pharmacist      [facility] Clinical Assessment & Refill Coordination Note    Shipment #2/3 of Rinvoq 45mg  load    Dakota Townsend, DOB: 01/30/1980  Phone: [phone] (home)     All above HIPAA information was verified with patient.     Was a Nurse, learning disability used for this call? No    Specialty Medication(s):   Inflammatory Disorders: Rinvoq  - loading dose shipment 2/3     Current Outpatient Medications   Medication Sig Dispense Refill    acetaminophen (TYLENOL) 500 MG tablet Take 2 tablets (1,000 mg total) by mouth every four (4) hours as needed.      albuterol HFA 90 mcg/actuation inhaler Inhale 2 puffs every four (4) hours as needed.      ALPRAZolam (XANAX) 0.25 MG tablet Take 1 tablet (0.25 mg total) by mouth daily as needed.      buprenorphine-naloxone (SUBOXONE) 8-2 mg sublingual film TAKE ONE FILM DAILY      chlorhexidine (PERIDEX) 0.12 % solution RINSE WITH 15 ML FOR 30 SECONDS TWO TIMES DAILY IN AM AND PM (SPIT AFTER USE)--USE ONLY TWO WEEKS      cyanocobalamin, vitamin B-12, 1000 MCG tablet Take 1 tablet (1,000 mcg total) by mouth daily. Taking      divalproex ER (DEPAKOTE ER) 500 MG extended released 24 hr tablet TAKE 2 TABLETS BY MOUTH WITH SUPPER DAILY      empty container Misc Use as directed with injectable medication 1 each 0    ferrous sulfate 325 (65 FE) MG tablet Take 1 tablet (325 mg total) by mouth daily.      lamoTRIgine (LAMICTAL) 200 MG tablet Take 1 tablet (200 mg total) by mouth.      levalbuterol (XOPENEX HFA) 45 mcg/actuation inhaler Inhale 1 puff every four (4) hours as needed.      loperamide (IMODIUM) 2 mg capsule TAKE 1-2 CAPSULES BY MOUTH EVERY 4 HOURS AS NEEDED FOR DIARRHEA 120 capsule 5    methocarbamoL (ROBAXIN) 500 MG tablet Take 1 tablet (500 mg total) by mouth four (4) times a day.      naproxen (NAPROSYN) 250 MG tablet Take 1 tablet (250 mg total) by mouth Three (3) times a day as needed.      ondansetron (ZOFRAN-ODT) 4 MG disintegrating tablet Dissolve 1 tablet (4 mg total) in the mouth every eight (8) hours as needed for nausea. 90 tablet 5    promethazine (PHENERGAN) 25 MG tablet 0.5-1 tab po q 8hrs prn nausea 45 tablet 2    UNABLE TO FIND Take 1 tablet by mouth in the morning. 5-Htp.      upadacitinib (RINVOQ) 30 mg tablet Take 1 tablet (30 mg total) by mouth daily. To start after loading dose is complete 90 tablet 0    upadacitinib (RINVOQ) 45 mg Tb24 Take 1 tablet (45 mg total) by mouth daily. Induction dose for 12weeks. 84 tablet 0     No current facility-administered medications for this visit.        Changes to medications: [name] reports no changes at this time.    Allergies   Allergen Reactions    Acetaminophen Nausea And Vomiting  Benadryl [Diphenhydramine Hcl] Anaphylaxis    Sulfa (Sulfonamide Antibiotics) Hives    Rozerem [Ramelteon] Other (See Comments)     Pt reports hallucinations, muscle contortions  & dry eyes    Shellfish Containing Products Diarrhea and Nausea And Vomiting       Changes to allergies: No    SPECIALTY MEDICATION ADHERENCE     Rinvoq 45 mg: 8 doses of medicine on hand     Specialty medication(s) dose(s) confirmed: Regimen is correct and unchanged.     Are there any concerns with adherence? No    Adherence counseling provided? Not needed    CLINICAL MANAGEMENT AND INTERVENTION      Clinical Benefit Assessment:    Do you feel the medicine is effective or helping your condition?  Unclear. Patient is having changes in bowel movements and said having less loose stools, but appears to also be having slight constipation now with occasional bouts of diarrhea.     Clinical Benefit counseling provided?  Talked with patient about trying to continue LD for efficacy and they agreed.      Adverse Effects Assessment:    Are you experiencing any side effects? Yes, patient reports experiencing increased fatigue, nausea, headaches, some mild mood changed. Side effect counseling provided: Talked to patient about fatigue potentially improving after a month on therapy. Talked to pt about headache and nausea management. Informed patient to contact us/provider immediately if mood changes worsen significantly      Are you experiencing difficulty administering your medicine? No    Quality of Life Assessment:    Quality of Life    Rheumatology  Oncology  Dermatology  Cystic Fibrosis          How many days over the past month did your Crohn's Disease  keep you from your normal activities? For example, brushing your teeth or getting up in the morning. Patient declined to answer. Pt only started this therapy within past month.     Have you discussed this with your provider? Not needed    Acute Infection Status:    Acute infections noted within Epic:  No active infections  Patient reported infection: None    Therapy Appropriateness:    Is therapy appropriate based on current medication list, adverse reactions, adherence, clinical benefit and progress toward achieving therapeutic goals? Yes, therapy is appropriate and should be continued. Pharmacist will reach out to provider given pt side effect profile.     DISEASE/MEDICATION-SPECIFIC INFORMATION      Chronic Inflammatory Diseases: Have you experienced any flares in the last month? Pt declined to answer     PATIENT SPECIFIC NEEDS     Does the patient have any physical, cognitive, or cultural barriers? No    Is the patient high risk? No    Did the patient require a clinical intervention? Yes (If yes, document using the sscrphintervention smartphrase)    Does the patient require physician intervention or other additional services (i.e., nutrition, smoking cessation, social work)? No    SOCIAL DETERMINANTS OF HEALTH     At the [facility], we have learned that life circumstances - like trouble affording food, housing, utilities, or transportation can affect the health of many of our patients.   That is why we wanted to ask: are you currently experiencing any life circumstances that are negatively impacting your health and/or quality of life? Patient declined to answer    Social Determinants of Health     Financial Resource Strain: Low Risk  (  08/28/2021)    Received from [facility], [facility]    Overall Financial Resource Strain (CARDIA)     Difficulty of Paying Living Expenses: Not hard at all   Internet Connectivity: Not on file   Food Insecurity: No Food Insecurity (08/28/2021)    Received from [facility], [facility]    Hunger Vital Sign     Worried About Running Out of Food in the Last Year: Never true     Ran Out of Food in the Last Year: Never true   Tobacco Use: Medium Risk (07/05/2022)    Patient History     Smoking Tobacco Use: Former     Smokeless Tobacco Use: Never     Passive Exposure: Not on file   Housing/Utilities: Not on file   Alcohol Use: Not on file   Transportation Needs: No Transportation Needs (08/28/2021)    Received from [facility], [facility]    [facility] - Transportation     Lack of Transportation (Medical): No     Lack of Transportation (Non-Medical): No   Substance Use: Not on file   Health Literacy: Not on file   Physical Activity: Inactive (08/28/2021)    Received from [facility], [facility]    Exercise Vital Sign     Days of Exercise per Week: 0 days     Minutes of Exercise per Session: 0 min   Interpersonal Safety: Unknown (08/28/2022)    Interpersonal Safety     Unsafe Where You Currently Live: Not on file     Physically Hurt by Anyone: Not on file     Abused by Anyone: Not on file   Stress: No Stress Concern Present (08/28/2021)    Received from [facility], [facility]    [facility] - Occupational Stress Questionnaire     Feeling of Stress : Not at all Intimate Partner Violence: Not At Risk (08/28/2021)    Received from [facility], [facility]    Humiliation, Afraid, Rape, and Kick questionnaire     Fear of Current or Ex-Partner: No     Emotionally Abused: No     Physically Abused: No     Sexually Abused: No   Depression: Not on file   Social Connections: Moderately Integrated (08/28/2021)    Received from [facility], [facility]    Social Connection and Isolation Panel [NHANES]     Frequency of Communication with Friends and Family: More than three times a week     Frequency of Social Gatherings with Friends and Family: More than three times a week     Attends Religious Services: More than 4 times per year     Active Member of [employer] or Organizations: No     Attends Engineer, structural: More than 4 times per year     Marital Status: Never married       Would you be willing to receive help with any of the needs that you have identified today? Not applicable       SHIPPING     Specialty Medication(s) to be Shipped:   Inflammatory Disorders: Rinvoq    Other medication(s) to be shipped: No additional medications requested for fill at this time     Changes to insurance: No    Delivery Scheduled: Yes, Expected medication delivery date: 08/30/22.     Medication will be delivered via UPS to the confirmed prescription address in [facility].    The patient will receive a  drug information handout for each medication shipped and additional FDA Medication Guides as required.  Verified that patient has previously received a Conservation officer, historic buildings and a Surveyor, mining.    The patient or caregiver noted above participated in the development of this care plan and knows that they can request review of or adjustments to the care plan at any time.      All of the patient's questions and concerns have been addressed.    Myna Bright, [place].D Candidate   [facility] Specialty Pharmacist

## 2022-08-28 NOTE — Unmapped (Signed)
[facility] Clinical Intervention    Type of intervention: Side effect management    Medication involved: Rinvoq 45mg  daily    Problems identified:   Pt is having side effects making it unclear the efficacy of this medication. Stated he is having severe fatigue. This is self limiting and should resolve after 23mo but important to monitor  Pt states having some mood changes therefore this should be followed very closely as if declines drastically even more should hold or stop medication.   Pt also complains of nausea & headaches     Intervention performed: Continuing mediation at this time in agreement with patient Routed message to clinic and patient has follow-up appointment scheduled with clinic on 8/29 and will discuss concerns then.    Follow-up needed: Reassess after this month of loading dose if side effect profile tolerable & disease state improvement/clinical effect.    Approximate time spent: 15-20 minutes    Clinical evidence used to support intervention: Professional judgement    Result of the intervention: Prevention of an adverse drug event and Improved medication adherence     Myna Bright, Pharm.D Candidate   [facility] Specialty Pharmacist

## 2022-08-29 MED FILL — RINVOQ 45 MG TABLET,EXTENDED RELEASE: ORAL | 28 days supply | Qty: 28 | Fill #1

## 2022-09-06 ENCOUNTER — Institutional Professional Consult (permissible substitution): Admit: 2022-09-06 | Discharge: 2022-09-07 | Payer: MEDICARE

## 2022-09-06 DIAGNOSIS — K50912 Crohn's disease, unspecified, with intestinal obstruction: Principal | ICD-10-CM

## 2022-09-06 DIAGNOSIS — Z79899 Other long term (current) drug therapy: Principal | ICD-10-CM

## 2022-09-06 DIAGNOSIS — R5383 Other fatigue: Principal | ICD-10-CM

## 2022-09-06 LAB — CBC W/ AUTO DIFF
BASOPHILS ABSOLUTE COUNT: 0 10*9/L (ref 0.0–0.1)
BASOPHILS RELATIVE PERCENT: 0.5 %
EOSINOPHILS ABSOLUTE COUNT: 0.1 10*9/L (ref 0.0–0.5)
EOSINOPHILS RELATIVE PERCENT: 2.3 %
HEMATOCRIT: 38.5 % — ABNORMAL LOW (ref 39.0–48.0)
HEMOGLOBIN: 13 g/dL (ref 12.9–16.5)
LYMPHOCYTES ABSOLUTE COUNT: 2.6 10*9/L (ref 1.1–3.6)
LYMPHOCYTES RELATIVE PERCENT: 41.8 %
MEAN CORPUSCULAR HEMOGLOBIN CONC: 33.9 g/dL (ref 32.0–36.0)
MEAN CORPUSCULAR HEMOGLOBIN: 31 pg (ref 25.9–32.4)
MEAN CORPUSCULAR VOLUME: 91.4 fL (ref 77.6–95.7)
MEAN PLATELET VOLUME: 8.4 fL (ref 6.8–10.7)
MONOCYTES ABSOLUTE COUNT: 0.5 10*9/L (ref 0.3–0.8)
MONOCYTES RELATIVE PERCENT: 8.4 %
NEUTROPHILS ABSOLUTE COUNT: 2.9 10*9/L (ref 1.8–7.8)
NEUTROPHILS RELATIVE PERCENT: 47 %
PLATELET COUNT: 258 10*9/L (ref 150–450)
RED BLOOD CELL COUNT: 4.21 10*12/L — ABNORMAL LOW (ref 4.26–5.60)
RED CELL DISTRIBUTION WIDTH: 13.5 % (ref 12.2–15.2)
WBC ADJUSTED: 6.2 10*9/L (ref 3.6–11.2)

## 2022-09-06 LAB — LIPID PANEL
CHOLESTEROL/HDL RATIO SCREEN: 2.9 (ref 1.0–4.5)
CHOLESTEROL: 175 mg/dL (ref <=200)
HDL CHOLESTEROL: 61 mg/dL — ABNORMAL HIGH (ref 40–60)
LDL CHOLESTEROL CALCULATED: 91 mg/dL (ref 40–99)
NON-HDL CHOLESTEROL: 114 mg/dL (ref 70–130)
TRIGLYCERIDES: 115 mg/dL (ref 0–150)
VLDL CHOLESTEROL CAL: 23 mg/dL (ref 11–50)

## 2022-09-06 LAB — COMPREHENSIVE METABOLIC PANEL
ALBUMIN: 3.9 g/dL (ref 3.4–5.0)
ALKALINE PHOSPHATASE: 71 U/L (ref 46–116)
ALT (SGPT): 16 U/L (ref 10–49)
ANION GAP: 6 mmol/L (ref 5–14)
AST (SGOT): 17 U/L (ref <=34)
BILIRUBIN TOTAL: 0.7 mg/dL (ref 0.3–1.2)
BLOOD UREA NITROGEN: 13 mg/dL (ref 9–23)
BUN / CREAT RATIO: 13
CALCIUM: 8.9 mg/dL (ref 8.7–10.4)
CHLORIDE: 106 mmol/L (ref 98–107)
CO2: 28 mmol/L (ref 20.0–31.0)
CREATININE: 0.98 mg/dL
EGFR CKD-EPI (2021) MALE: >90 mL/min/{1.73_m2} (ref >=60)
GLUCOSE RANDOM: 86 mg/dL (ref 70–179)
POTASSIUM: 4.6 mmol/L (ref 3.4–4.8)
PROTEIN TOTAL: 6.7 g/dL (ref 5.7–8.2)
SODIUM: 140 mmol/L (ref 135–145)

## 2022-09-06 LAB — IRON PANEL
IRON SATURATION: 61 % — ABNORMAL HIGH (ref 20–55)
IRON: 192 ug/dL — ABNORMAL HIGH
TOTAL IRON BINDING CAPACITY: 313 ug/dL (ref 250–425)

## 2022-09-06 LAB — VITAMIN B12: VITAMIN B-12: 323 pg/mL (ref 211–911)

## 2022-09-06 LAB — TSH: THYROID STIMULATING HORMONE: 3.952 u[IU]/mL (ref 0.550–4.780)

## 2022-09-06 LAB — FERRITIN: FERRITIN: 79.9 ng/mL

## 2022-09-06 LAB — C-REACTIVE PROTEIN: C-REACTIVE PROTEIN: <4 mg/L (ref <=10.0)

## 2022-09-06 MED ORDER — LOPERAMIDE 2 MG CAPSULE
ORAL_CAPSULE | 5 refills | 0 days | Status: CP
Start: 2022-09-06 — End: ?

## 2022-09-06 NOTE — Unmapped (Signed)
It was a pleasure seeing you today! The following was discussed at today's visit:  Continue Rinvoq 45 mg daily  Will check labs today to monitor therapy on Rinvoq  Will check stool for infection    Please reach out if you have any questions or concerns. Thank you!    Edsel Petrin, PharmD, BCPS, CPP  Clinical Pharmacist Practitioner, GI/IBD  [phone]

## 2022-09-06 NOTE — Unmapped (Signed)
[facility]  CLINICAL PHARMACIST PRACTITIONER VISIT    09/06/2022    HPI:     I saw Dakota Townsend today for follow up after initiation of Upadacitinib for management of his Crohn's Disease at the request of Dr. Noel Gerold.    Interval History:  Dakota Townsend reports doing okay today since starting Rinvoq. He reports feeling well the first few weeks of starting Rinvoq, having one bowel movement every 2-3 days and was able to stop taking loperamide. However, in the past 1-2 weeks, he's been having 3-4 bowel movements that are watery in nature with urgency and cramping before needing to have bowel movement. He reports being more bloated and increased gas, causing more cramping pain. He reports severe fatigue and tiredness and tries to drink ginseng energy drink but makes no difference. He states his appetite comes and goes. He stopped taking loperamide when he had response to Rinvoq and has not restarted since, even with return of symptoms.      Evaluation of IBD Biologic Therapy: upadacitinib  Time on Current Therapy: Day 30 (started 7/31)     - Last Dose: 8/29     - Adherence: no missed doses     - Symptoms:  # of BM/day: 3   Stool Consistency: watery   Stool Urgency: some   Nocturnal BM: none   Blood in Stool: none   Abdominal Pain: Cramping with bowel movement and gas   Nausea/Vomiting: Nausea   Any Sickness: none   NSAID use: none   Antidiarrheal use: No recent loperamide use      - Adverse Effects: none    Other IBD Therapies:  []  5-ASA  []  Immunomodulators  []  Corticosteroids    Disease Severity Index:  [name]  General Well Being: 1 = Slightly below par  Abdominal Pain:  2 = moderate  Number of Liquid Stools Per day: 3  Abdominal Mass:  0 = None  Number of Extraintestinal Manifestations:  0   1 point each for: 1. Arthritis/arthralgias, 2. Iritis/uveitis, 3. Active perianal dz, 4. Other active fistula, 5. Erythema nodosum or pyoderma, 6. Other  Total HBI Score (0-25):  6     Score:  Remission < 5;  Mild dz 5-7;  Moderate dz 8-16;  Severe > 16         MEDICATIONS/ALLERGIES:     Current Outpatient Medications   Medication Instructions    acetaminophen (TYLENOL) 1,000 mg, Oral, Every 4 hours PRN    albuterol HFA 90 mcg/actuation inhaler 2 puffs, Inhalation, Every 4 hours PRN    ALPRAZolam (XANAX) 0.25 mg, Oral, Daily PRN    buprenorphine-naloxone (SUBOXONE) 8-2 mg sublingual film TAKE ONE FILM DAILY    chlorhexidine (PERIDEX) 0.12 % solution RINSE WITH 15 ML FOR 30 SECONDS TWO TIMES DAILY IN AM AND PM (SPIT AFTER USE)--USE ONLY TWO WEEKS    divalproex ER (DEPAKOTE ER) 500 MG extended released 24 hr tablet TAKE 2 TABLETS BY MOUTH WITH SUPPER DAILY    lamoTRIgine (LAMICTAL) 200 mg, Oral    levalbuterol (XOPENEX HFA) 45 mcg/actuation inhaler 1 puff, Inhalation, Every 4 hours PRN    loperamide (IMODIUM) 2 mg capsule TAKE 1-2 CAPSULES BY MOUTH EVERY 4 HOURS AS NEEDED FOR DIARRHEA    naproxen (NAPROSYN) 250 mg, Oral, 3 times a day PRN    ondansetron (ZOFRAN-ODT) 4 mg, orally disintegrating tablet, Every 8 hours PRN    promethazine (PHENERGAN) 25 MG tablet 0.5-1  tab po q 8hrs prn nausea    RINVOQ 45 mg, Oral, Daily (standard), Induction dose for 12weeks.    UNABLE TO FIND 1 tablet, Oral, Daily, 5-Htp    upadacitinib (RINVOQ) 30 mg, Oral, Daily (standard), To start after loading dose is complete       Allergies   Allergen Reactions    Acetaminophen Nausea And Vomiting    Benadryl [Diphenhydramine Hcl] Anaphylaxis    Sulfa (Sulfonamide Antibiotics) Hives    Rozerem [Ramelteon] Other (See Comments)     Pt reports hallucinations, muscle contortions  & dry eyes    Shellfish Containing Products Diarrhea and Nausea And Vomiting         IBD HISTORY:     Year of disease onset:  2007  Diagnosis:  Crohn's Disease  Age at onset:   4-40 yr old (A2)  Location:  Ileal (L1)  Behavior:  Nonstricturing,nonpenetrating (B1)  Perianal Dz:  No    Brief IBD Disease Course: Crohn's disease diagnosed as ileocolitis in 2007, treated initially with Pentasa and prednisone. Infliximab with partial response. Imuran with no help. Infusion reaction during infliximab retreatment. Adalimumab provided no benefit. Certolizumab with mild improvement but plateaued despite once weekly dosing. In 12/2007, CT with ileitis, internal fistula and abscess. 02/04/08, ielocolic resection (40cm ti) by Dr. [place] in [place]. 07/2008, diarrhea. 05/03/09, thickening of the neoterminal ileum on CT scan. 6-MP started 50 mg per day. 05/03/09, start adalimumab. 06/01/09, 6-MP increased to 75 mg. 07/19/09, desipramine to 100 mg per day without benefit. 11/10/2009, 6 inches of ileal resection (stricture) with primary anastomosis. 12/20/2009, reluctant to restart immunosuppression. 03/21/10, stopped desipramine due to lack of benefit and start Cymbalta. 05/25/10 Colonoscopy with i3 recurrence. Restart 6-MP 75mg , but declined adalimumab due to toe infections. 03/01/11, 6 inches of small bowel resected just proximal to the anastomosis. 05/22/11 start oral methotrexate. 06/04/11, CT abdomen and pelvis showed small-bowel dilation to 3.4 cm, prominent thickening of the ileocolonic anastomosis. Start ustekinumab. 10/26/11, colonoscopy showed minimal improvement. MTX switched to 25mg  [place]. 02/20/13, colonoscopy showed I2 disease (possibly mildly improved). Patient self stopped methotrexate due to perceived increase infectious complications. Continued ustekinumab monotherapy. 2017, stopped ustekinumab due to incarceration. 2018, worsening symptoms, start adalimumab in prison with improvement. 08/2019, stopped adalimumab upon release from prison. Worsening symptoms. 09/08/2020, start 6mp 75mg  daily. 12/06/2020, start adalimumab. 07/27/21, strep intermedius empyema of the left lung. and adalimumab stopped. 11/21/2021, resume 6mp 75mg . 12/06/2021, resume adalimumab.     Endoscopy:      Colonoscopy 07/18/21  - Nondraining perianal fistula vs. sinus tract was found on perianal exam.  - Functional end-to-end ileo-colonic anastomosis, characterized by stenosis, edema and small superficial ulceration. Dilated to 13.52mm    Imaging:    CT Abd 07/05/2022  -Long segment of distal ileum and neoterminal ileum demonstrating findings of active inflammatory Crohn's disease with stricture. No evidence of abscess or fistula.   -Mild circumferential rectal wall thickening may represent acute proctitis. Probable additional short segment strictures in the sigmoid colon without significant upstream colonic dilation.       Extraintestinal Manifestations:   []  Joint Pains:  []  Ocular:  []  Skin:  []  Oral ulcers:  []  Thrombosis:  []  PSC:  []  Other:    Prior IBD Therapy:  [x]  5-ASAs - Pentasa  [x]  Oral corticosteroids - prednisone  []  Intravenous corticosteroids  []  Antibiotics  [x]  Thiopurines - azathioprine, 6-mercaptopurine  [x]  Methotrexate  [x]  Anti-TNF therapies - infliximab, adalimumab, certolizumab  []  Anti-Integrin therapies  [x]   Anti-Interleukin therapies - ustekinumab  []  Anti-JAK therapies  []  Cyclosporine  []  Clinical trial medication  []  Other (Please specify):    IBD Health Maintenance    Vaccine Date   Influenza --   Pneumococcal 09/08/2020   COVID-19 --   Zoster 12/28/2021, 02/09/2021   Hepatitis B --       []  Iron Deficiency  Lab Results   Component Value Date    FERRITIN 32.9 09/08/2020    HGB 13.6 07/05/2022       []  Vitamin D Deficiency  No results found for: VITDTOTAL       RELEVANT LABS, DATA, INDICES:     Lab Results   Component Value Date    HBSAG Nonreactive 09/08/2020    QFTTBGOLD Positive (A) 09/08/2020       Lab Results   Component Value Date    WBC 5.4 07/05/2022    HGB 13.6 07/05/2022    HCT 39.7 07/05/2022    PLT 278 07/05/2022       Lab Results   Component Value Date    NA 139 12/16/2012    K 4.6 12/16/2012    CL 103 12/16/2012    CO2 24 12/16/2012    BUN 8 12/16/2012    CREATININE 0.84 12/16/2012    CALCIUM 9.0 12/16/2012    MG 1.8 08/18/2012    PHOS 3.7 08/18/2012       Lab Results   Component Value Date    BILITOT 0.4 12/16/2012    PROT 7.2 12/16/2012    ALBUMIN 4.0 12/16/2012    ALT 16 07/05/2022    AST 55 12/16/2012    ALKPHOS 115 09/08/2020    GGT 33 04/09/2012       Lab Results   Component Value Date    CRP <4.0 07/05/2022       Lab Results   Component Value Date    Adalimumab 13.3 07/05/2022    Adalimumab 10.2 02/09/2021         ASSESSMENT & RECOMMENDATIONS:     Crohn's Disease  Patient with initial improvement in symptoms after starting Rinvoq therapy, however recently with return of symptoms of increased bowel movements of watery consistency, increased fatigue and urgency. HBI = 6  - Continue Rinvoq 45 mg daily for now  - Rule out infectious etiology with GI Stool Pathogen Panel and C.diff  - Check CRP, stool calprotectin for ongoing inflammation  - Will check CBC, CMP, iron studies, vitamin B12, TSH  - Based on labs, will reschedule CT Abdomen sooner if needed      --------------------------------------------    Edsel Petrin, PharmD, BCPS, CPP  Clinical Pharmacist Practitioner - GI/IBD  [facility]

## 2022-09-13 MED ORDER — PREDNISONE 10 MG TABLET
ORAL_TABLET | ORAL | 0 refills | 28 days | Status: CP
Start: 2022-09-13 — End: 2022-10-11

## 2022-09-13 NOTE — Unmapped (Signed)
Called Mr. Brehm regarding recent message about labs and fatigue. Due to ongoing symptoms and workup negative for infection, will recommend prednisone taper (see below) to help with symptoms while continuing the Rinvoq, which may need a little more time to take effect. Patient states he has trouble with insomnia which certainly could be contributing to fatigue. He takes Xanax at bedtime to help with sleep so recommended he reach out to psychiatrist to see if he needs dose adjustments to help with sleep. Explained to patient that iron levels may be elevated due to his body using iron stores to make more RBCs and keep Hgb stable. Patient in agreement with starting prednisone taper and continuing Rinvoq. Will follow up with me in one month.    Prednisone Course:  Prednisone 40 mg (4 tabs) daily x 7 days  Prednisone 20 mg (2 tabs) daily x 7 days  Prednisone 10 mg (1 tab) daily x 7 days  Prednisone 5 mg (0.5 tab) daily x 7 day    Edsel Petrin, PharmD, BCPS, CPP  Clinical Pharmacist Practitioner, GI/IBD  [phone]

## 2022-09-18 NOTE — Unmapped (Signed)
[facility] Clinical Assessment & Refill Coordination Note    Shipment #3/3 for loading dose (Rinvoq 45mg )    Dakota Townsend, DOB: 30-Jan-1980  Phone: [phone] (home)     All above HIPAA information was verified with patient.     Was a Nurse, learning disability used for this call? No    Specialty Medication(s):   Inflammatory Disorders: Rinvoq     Current Outpatient Medications   Medication Sig Dispense Refill    acetaminophen (TYLENOL) 500 MG tablet Take 2 tablets (1,000 mg total) by mouth every four (4) hours as needed.      albuterol HFA 90 mcg/actuation inhaler Inhale 2 puffs every four (4) hours as needed.      ALPRAZolam (XANAX) 0.25 MG tablet Take 1 tablet (0.25 mg total) by mouth daily as needed.      buprenorphine-naloxone (SUBOXONE) 8-2 mg sublingual film TAKE ONE FILM DAILY      chlorhexidine (PERIDEX) 0.12 % solution RINSE WITH 15 ML FOR 30 SECONDS TWO TIMES DAILY IN AM AND PM (SPIT AFTER USE)--USE ONLY TWO WEEKS      divalproex ER (DEPAKOTE ER) 500 MG extended released 24 hr tablet TAKE 2 TABLETS BY MOUTH WITH SUPPER DAILY      lamoTRIgine (LAMICTAL) 200 MG tablet Take 1 tablet (200 mg total) by mouth.      levalbuterol (XOPENEX HFA) 45 mcg/actuation inhaler Inhale 1 puff every four (4) hours as needed.      loperamide (IMODIUM) 2 mg capsule TAKE 1-2 CAPSULES BY MOUTH EVERY 4 HOURS AS NEEDED FOR DIARRHEA 120 capsule 5    naproxen (NAPROSYN) 250 MG tablet Take 1 tablet (250 mg total) by mouth Three (3) times a day as needed.      ondansetron (ZOFRAN-ODT) 4 MG disintegrating tablet Dissolve 1 tablet (4 mg total) in the mouth every eight (8) hours as needed for nausea. 90 tablet 5    predniSONE (DELTASONE) 10 MG tablet Take 4 tablets (40 mg total) by mouth daily for 7 days, THEN 2 tablets (20 mg total) daily for 7 days, THEN 1 tablet (10 mg total) daily for 7 days, THEN 0.5 tablets (5 mg total) daily for 7 days. 53 tablet 0    promethazine (PHENERGAN) 25 MG tablet 0.5-1 tab po q 8hrs prn nausea 45 tablet 2    UNABLE TO FIND Take 1 tablet by mouth in the morning. 5-Htp.      upadacitinib (RINVOQ) 30 mg tablet Take 1 tablet (30 mg total) by mouth daily. To start after loading dose is complete 90 tablet 0    upadacitinib (RINVOQ) 45 mg Tb24 Take 1 tablet (45 mg total) by mouth daily. Induction dose for 12weeks. 84 tablet 0     No current facility-administered medications for this visit.        Changes to medications: Rana reports no changes at this time.    Allergies   Allergen Reactions    Acetaminophen Nausea And Vomiting    Benadryl [Diphenhydramine Hcl] Anaphylaxis    Sulfa (Sulfonamide Antibiotics) Hives    Rozerem [Ramelteon] Other (See Comments)     Pt reports hallucinations, muscle contortions  & dry eyes    Shellfish Containing Products Diarrhea and Nausea And Vomiting       Changes to allergies: No    SPECIALTY MEDICATION ADHERENCE     Rinvoq 45 mg: ~14 days of medicine on hand     Medication Adherence    Patient reported X missed doses  in the last month: 0  Specialty Medication: Rinvoq 45mg  - 1qd  Patient is on additional specialty medications: No  Patient is on more than two specialty medications: No  Informant: patient          Specialty medication(s) dose(s) confirmed: Regimen is correct and unchanged.     Are there any concerns with adherence? No    Adherence counseling provided? Not needed    CLINICAL MANAGEMENT AND INTERVENTION      Clinical Benefit Assessment:    Do you feel the medicine is effective or helping your condition? Yes    Clinical Benefit counseling provided? Not needed    Adverse Effects Assessment:    Are you experiencing any side effects? No    Are you experiencing difficulty administering your medicine? No    Quality of Life Assessment:    Quality of Life    Rheumatology  Oncology  Dermatology  Cystic Fibrosis          How many days over the past month did your CD  keep you from your normal activities? For example, brushing your teeth or getting up in the morning. Patient declined to answer    Have you discussed this with your provider? Not needed    Acute Infection Status:    Acute infections noted within Epic:  No active infections  Patient reported infection: None    Therapy Appropriateness:    Is therapy appropriate based on current medication list, adverse reactions, adherence, clinical benefit and progress toward achieving therapeutic goals? Yes, therapy is appropriate and should be continued     DISEASE/MEDICATION-SPECIFIC INFORMATION      N/A    Chronic Inflammatory Diseases: Have you experienced any flares in the last month? No  Has this been reported to your provider? Not applicable    PATIENT SPECIFIC NEEDS     Does the patient have any physical, cognitive, or cultural barriers? No    Is the patient high risk? No    Did the patient require a clinical intervention? No    Does the patient require physician intervention or other additional services (i.e., nutrition, smoking cessation, social work)? No    SOCIAL DETERMINANTS OF HEALTH     At the [facility], we have learned that life circumstances - like trouble affording food, housing, utilities, or transportation can affect the health of many of our patients.   That is why we wanted to ask: are you currently experiencing any life circumstances that are negatively impacting your health and/or quality of life? No    Social Determinants of Health     Financial Resource Strain: Low Risk  (08/28/2021)    Received from [facility], [facility]    Overall Financial Resource Strain (CARDIA)     Difficulty of Paying Living Expenses: Not hard at all   Internet Connectivity: Not on file   Food Insecurity: No Food Insecurity (08/28/2021)    Received from [facility], [facility]    Hunger Vital Sign     Worried About Running Out of Food in the Last Year: Never true     Ran Out of Food in the Last Year: Never true   Tobacco Use: High Risk (09/06/2022)    Patient History     Smoking Tobacco Use: Every Day     Smokeless Tobacco Use: Never     Passive Exposure: Not on file   Housing/Utilities: Not on file   Alcohol Use: Not on file   Transportation Needs:  No Transportation Needs (08/28/2021)    Received from [facility], [facility]    [facility] - Transportation     Lack of Transportation (Medical): No     Lack of Transportation (Non-Medical): No   Substance Use: Not on file   Health Literacy: Not on file   Physical Activity: Inactive (08/28/2021)    Received from [facility], [facility]    Exercise Vital Sign     Days of Exercise per Week: 0 days     Minutes of Exercise per Session: 0 min   Interpersonal Safety: Unknown (09/18/2022)    Interpersonal Safety     Unsafe Where You Currently Live: Not on file     Physically Hurt by Anyone: Not on file     Abused by Anyone: Not on file   Stress: No Stress Concern Present (08/28/2021)    Received from [facility], [facility]    [facility] - Occupational Stress Questionnaire     Feeling of Stress : Not at all   Intimate Partner Violence: Not At Risk (08/28/2021)    Received from [facility], [facility]    Humiliation, Afraid, Rape, and Kick questionnaire     Fear of Current or Ex-Partner: No     Emotionally Abused: No     Physically Abused: No     Sexually Abused: No   Depression: Not on file   Social Connections: Moderately Integrated (08/28/2021)    Received from [facility], [facility]    Social Connection and Isolation Panel [NHANES]     Frequency of Communication with Friends and Family: More than three times a week     Frequency of Social Gatherings with Friends and Family: More than three times a week     Attends Religious Services: More than 4 times per year     Active Member of [employer] or Organizations: No     Attends Engineer, structural: More than 4 times per year     Marital Status: Never married       Would you be willing to receive help with any of the needs that you have identified today? Not applicable       SHIPPING     Specialty Medication(s) to be Shipped:   Inflammatory Disorders: Rinvoq    Other medication(s) to be shipped: No additional medications requested for fill at this time     Changes to insurance: No    Delivery Scheduled: Yes, Expected medication delivery date: 9/13.     Medication will be delivered via UPS to the confirmed prescription address in [facility].    The patient will receive a drug information handout for each medication shipped and additional FDA Medication Guides as required.  Verified that patient has previously received a Conservation officer, historic buildings and a Surveyor, mining.    The patient or caregiver noted above participated in the development of this care plan and knows that they can request review of or adjustments to the care plan at any time.      All of the patient's questions and concerns have been addressed.    Teofilo Pod, PharmD   [facility] Specialty Pharmacist

## 2022-09-20 MED FILL — RINVOQ 45 MG TABLET,EXTENDED RELEASE: ORAL | 28 days supply | Qty: 28 | Fill #2

## 2022-09-24 DIAGNOSIS — K50912 Crohn's disease, unspecified, with intestinal obstruction: Principal | ICD-10-CM

## 2022-10-11 ENCOUNTER — Ambulatory Visit: Admit: 2022-10-11 | Discharge: 2022-10-12 | Payer: MEDICARE

## 2022-10-11 DIAGNOSIS — K50912 Crohn's disease, unspecified, with intestinal obstruction: Principal | ICD-10-CM

## 2022-10-11 MED ORDER — PREDNISONE 10 MG TABLET
ORAL_TABLET | Freq: Every day | ORAL | 0 refills | 14 days | Status: CP
Start: 2022-10-11 — End: 2022-10-25

## 2022-10-11 MED ORDER — CLINDAMYCIN 1 % LOTION
Freq: Two times a day (BID) | TOPICAL | 1 refills | 0 days | Status: CP
Start: 2022-10-11 — End: 2022-11-10

## 2022-10-11 NOTE — Unmapped (Signed)
Dakota Townsend has been contacted in regards to their refill of Rinvoq. At this time, they have declined refill due to medication being on hold. Refill assessment call date has been updated per the patient's request.

## 2022-10-11 NOTE — Unmapped (Signed)
[facility]  CLINICAL PHARMACIST PRACTITIONER VISIT    10/11/2022    HPI:     I saw Dakota Townsend today for follow up after initiation of Upadacitinib for management of his Crohn's Disease at the request of Dr. Noel Gerold.    Interval History:  Dakota Townsend reports not doing so well on Rinvoq but he is trying to remain positive. He reports on 9/22 he had 15-20 bowel movements that lasted about 6 days. He's had no appetite, eating one meal a day. He has resumed taking loperamide 5-8 doses per day depending on stool frequency. His last prednisone dose was 9/29. He reports his symptoms were much improved the first two weeks of prednisone taper and then symptoms returned when dose reduced to 20 mg. He reports no blood in the stool and some urgency with bowel movements. He reports incontinent episodes in his sleep at least 3 nights a week and is scared to fall asleep. He states cramping and bloating is present all the time. He also reports acne on his chest and back when he started prednisone in addition to Rinvoq.      Evaluation of IBD Biologic Therapy: upadacitinib  Time on Current Therapy: Day 65 (started 7/31)     - Last Dose: 10/2     - Adherence: missed a day or two     - Symptoms:  # of BM/day: 15-20   Stool Consistency: loose   Stool Urgency: some   Nocturnal BM: Yes; incontinent episodes 3x week   Blood in Stool: none   Abdominal Pain: Yes; cramping and bloating   Nausea/Vomiting: none   Any Sickness: none   NSAID use: none   Antidiarrheal use: Loperamide 5-8x day      - Adverse Effects: acne on chest and back    Other IBD Therapies:  []  5-ASA  []  Immunomodulators  [x]  Corticosteroids - prednisone taper completed 9/29    Disease Severity Index:  [name]  General Well Being: 2 = Poor  Abdominal Pain:  1 = mild  Number of Liquid Stools Per day: 10+  Abdominal Mass:  0 = None  Number of Extraintestinal Manifestations:  0   1 point each for: 1. Arthritis/arthralgias, 2. Iritis/uveitis, 3. Active perianal dz, 4. Other active fistula, 5. Erythema nodosum or pyoderma, 6. Other  Total HBI Score (0-25):  13     Score:  Remission < 5;  Mild dz 5-7;  Moderate dz 8-16;  Severe > 16       MEDICATIONS/ALLERGIES:     Current Outpatient Medications   Medication Instructions    acetaminophen (TYLENOL) 1,000 mg, Oral, Every 4 hours PRN    albuterol HFA 90 mcg/actuation inhaler 2 puffs, Inhalation, Every 4 hours PRN    ALPRAZolam (XANAX) 0.25 mg, Oral, Daily PRN    buprenorphine-naloxone (SUBOXONE) 8-2 mg sublingual film TAKE ONE FILM DAILY    clindamycin (CLEOCIN T) 1 % lotion Topical, 2 times a day (standard)    divalproex ER (DEPAKOTE ER) 500 MG extended released 24 hr tablet TAKE 2 TABLETS BY MOUTH WITH SUPPER DAILY    lamoTRIgine (LAMICTAL) 200 mg, Oral    levalbuterol (XOPENEX HFA) 45 mcg/actuation inhaler 1 puff, Inhalation, Every 4 hours PRN    loperamide (IMODIUM) 2 mg capsule TAKE 1-2 CAPSULES BY MOUTH EVERY 4 HOURS AS NEEDED FOR DIARRHEA    naproxen (NAPROSYN) 250 mg, Oral, 3 times a day PRN    ondansetron (  ZOFRAN-ODT) 4 mg, orally disintegrating tablet, Every 8 hours PRN    predniSONE (DELTASONE) 40 mg, Oral, Daily (standard)    promethazine (PHENERGAN) 25 MG tablet 0.5-1 tab po q 8hrs prn nausea    RINVOQ 45 mg, Oral, Daily (standard), Induction dose for 12weeks.    UNABLE TO FIND 1 tablet, Oral, Daily, 5-Htp    upadacitinib (RINVOQ) 30 mg, Oral, Daily (standard), To start after loading dose is complete       Allergies   Allergen Reactions    Acetaminophen Nausea And Vomiting    Benadryl [Diphenhydramine Hcl] Anaphylaxis    Sulfa (Sulfonamide Antibiotics) Hives    Rozerem [Ramelteon] Other (See Comments)     Pt reports hallucinations, muscle contortions  & dry eyes    Shellfish Containing Products Diarrhea and Nausea And Vomiting         IBD HISTORY:     Year of disease onset:  2007  Diagnosis:  Crohn's Disease  Age at onset:   2-40 yr old (A2)  Location: Ileal (L1)  Behavior:  Nonstricturing,nonpenetrating (B1)  Perianal Dz:  No     Brief IBD Disease Course:    Crohn's disease diagnosed as ileocolitis in 2007, treated initially with Pentasa and prednisone. Infliximab with partial response. Imuran with no help. Infusion reaction during infliximab retreatment. Adalimumab provided no benefit. Certolizumab with mild improvement but plateaued despite once weekly dosing. In 12/2007, CT with ileitis, internal fistula and abscess. 02/04/08, ielocolic resection (40cm ti) by Dr. Shio[place]on Sever in [place]. 07/2008, diarrhea. 05/03/09, thickening of the neoterminal ileum on CT scan. 6-MP started 50 mg per day. 05/03/09, start adalimumab. 06/01/09, 6-MP increased to 75 mg. 07/19/09, desipramine to 100 mg per day without benefit. 11/10/2009, 6 inches of ileal resection (stricture) with primary anastomosis. 12/20/2009, reluctant to restart immunosuppression. 03/21/10, stopped desipramine due to lack of benefit and start Cymbalta. 05/25/10 Colonoscopy with i3 recurrence. Restart 6-MP 75mg , but declined adalimumab due to toe infections. 03/01/11, 6 inches of small bowel resected just proximal to the anastomosis. 05/22/11 start oral methotrexate. 06/04/11, CT abdomen and pelvis showed small-bowel dilation to 3.4 cm, prominent thickening of the ileocolonic anastomosis. Start ustekinumab. 10/26/11, colonoscopy showed minimal improvement. MTX switched to 25mg  [place]. 02/20/13, colonoscopy showed I2 disease (possibly mildly improved). Patient self stopped methotrexate due to perceived increase infectious complications. Continued ustekinumab monotherapy. 2017, stopped ustekinumab due to incarceration. 2018, worsening symptoms, start adalimumab in prison with improvement. 08/2019, stopped adalimumab upon release from prison. Worsening symptoms. 09/08/2020, start 6mp 75mg  daily. 12/06/2020, start adalimumab. 07/27/21, strep intermedius empyema of the left lung. and adalimumab stopped. 11/21/2021, resume 6mp 75mg . 12/06/2021, resume adalimumab.      Endoscopy:      Colonoscopy 07/18/21  - Nondraining perianal fistula vs. sinus tract was found on perianal exam.  - Functional end-to-end ileo-colonic anastomosis, characterized by stenosis, edema and small superficial ulceration. Dilated to 13.36mm     Imaging:    CT Abd 07/05/2022  -Long segment of distal ileum and neoterminal ileum demonstrating findings of active inflammatory Crohn's disease with stricture. No evidence of abscess or fistula.   -Mild circumferential rectal wall thickening may represent acute proctitis. Probable additional short segment strictures in the sigmoid colon without significant upstream colonic dilation.         Extraintestinal Manifestations:   []  Joint Pains:  []  Ocular:  []  Skin:  []  Oral ulcers:  []  Thrombosis:  []  PSC:  []  Other:     Prior IBD Therapy:  [x]  5-ASAs -  Pentasa  [x]  Oral corticosteroids - prednisone  []  Intravenous corticosteroids  []  Antibiotics  [x]  Thiopurines - azathioprine, 6-mercaptopurine  [x]  Methotrexate  [x]  Anti-TNF therapies - infliximab, adalimumab, certolizumab  []  Anti-Integrin therapies  [x]  Anti-Interleukin therapies - ustekinumab  []  Anti-JAK therapies  []  Cyclosporine  []  Clinical trial medication  []  Other (Please specify):     IBD Health Maintenance     Vaccine Date   Influenza --   Pneumococcal 09/08/2020   COVID-19 --   Zoster 12/28/2021, 02/09/2021   Hepatitis B --         []  Iron Deficiency  Lab Results   Component Value Date    FERRITIN 79.9 09/06/2022    LABIRON 61 (H) 09/06/2022    HGB 13.0 09/06/2022       []  Vitamin D Deficiency  No results found for: VITDTOTAL       RELEVANT LABS, DATA, INDICES:     Lab Results   Component Value Date    HBSAG Nonreactive 09/08/2020    QFTTBGOLD Positive (A) 09/08/2020       Lab Results   Component Value Date    WBC 6.2 09/06/2022    HGB 13.0 09/06/2022    HCT 38.5 (L) 09/06/2022    PLT 258 09/06/2022       Lab Results   Component Value Date    NA 140 09/06/2022    K 4.6 09/06/2022    CL 106 09/06/2022    CO2 28.0 09/06/2022    BUN 13 09/06/2022    CREATININE 0.98 09/06/2022    GLU 86 09/06/2022    CALCIUM 8.9 09/06/2022    MG 1.8 08/18/2012    PHOS 3.7 08/18/2012       Lab Results   Component Value Date    BILITOT 0.7 09/06/2022    PROT 6.7 09/06/2022    ALBUMIN 3.9 09/06/2022    ALT 16 09/06/2022    AST 17 09/06/2022    ALKPHOS 71 09/06/2022    GGT 33 04/09/2012       Lab Results   Component Value Date    CRP <4.0 09/06/2022    NULL 276 (H) 09/06/2022       Lab Results   Component Value Date    Adalimumab 13.3 07/05/2022    Adalimumab 10.2 02/09/2021         ASSESSMENT & RECOMMENDATIONS:     Crohn's Disease  Patient with minimal improvement on Rinvoq with HBI score of 13. Patient had some relief while on higher doses of prednisone but once on 20 mg and less, symptoms returned. Checked labs on 8/29 and all normal with no C.diff or GI pathogens but elevated fecal calprotectin at 276  - Continue Rinvoq 45 mg daily for now  - Re-start prednisone 40 mg daily and continue until appt with Dr. Stevphen Rochester 10/14  - Will order CT Enterography to potentially get next week  - Clindamycin 1% topical twice daily on chest and back for acne    Vaccinations  - Will address at next visit; in need of flu and COVID    --------------------------------------------    Edsel Petrin, PharmD, BCPS, CPP  Clinical Pharmacist Practitioner - GI/IBD  [facility]

## 2022-10-11 NOTE — Unmapped (Addendum)
It was a pleasure seeing you today! The following was discussed at today's visit:  Will plan for CT Enterography next week  Resume prednisone 40 mg daily x 2 weeks until appt with Dr. Stevphen Rochester  Continue Rinvoq 45 mg daily for now  Start clindamycin 1% lotion twice daily for acne on chest and back  Appt with Dr. Stevphen Rochester on 10/14 @ 9:45 am    Please reach out if you have any questions or concerns. Thank you!    Edsel Petrin, PharmD, BCPS, CPP  Clinical Pharmacist Practitioner, GI/IBD  [phone]

## 2022-10-12 DIAGNOSIS — K50912 Crohn's disease, unspecified, with intestinal obstruction: Principal | ICD-10-CM

## 2022-10-12 NOTE — Unmapped (Signed)
Per Dr. Stevphen Rochester, need CTE now to evalaute for any changes in disease activity since starting Rinvoq therapy.

## 2022-10-19 NOTE — Unmapped (Signed)
Scheduled for appt and CTE on 10/22/2022, Patient verbalized understanding and in agreement with plan.

## 2022-10-22 ENCOUNTER — Ambulatory Visit: Admit: 2022-10-22 | Discharge: 2022-10-23 | Payer: MEDICARE

## 2022-10-22 ENCOUNTER — Ambulatory Visit: Admit: 2022-10-22 | Discharge: 2022-10-23 | Payer: MEDICARE | Attending: Gastroenterology | Primary: Gastroenterology

## 2022-10-22 DIAGNOSIS — K50912 Crohn's disease, unspecified, with intestinal obstruction: Principal | ICD-10-CM

## 2022-10-22 DIAGNOSIS — Z7952 Long term (current) use of systemic steroids: Secondary | ICD-10-CM | POA: Diagnosis not present

## 2022-10-22 DIAGNOSIS — R110 Nausea: Secondary | ICD-10-CM | POA: Diagnosis not present

## 2022-10-22 DIAGNOSIS — Z79899 Other long term (current) drug therapy: Secondary | ICD-10-CM | POA: Diagnosis not present

## 2022-10-22 DIAGNOSIS — K5000 Crohn's disease of small intestine without complications: Secondary | ICD-10-CM | POA: Diagnosis not present

## 2022-10-22 DIAGNOSIS — F17210 Nicotine dependence, cigarettes, uncomplicated: Secondary | ICD-10-CM | POA: Diagnosis not present

## 2022-10-22 LAB — COMPREHENSIVE METABOLIC PANEL
ALBUMIN: 3.7 g/dL (ref 3.4–5.0)
ALKALINE PHOSPHATASE: 68 U/L (ref 46–116)
ALT (SGPT): 26 U/L (ref 10–49)
ANION GAP: 6 mmol/L (ref 5–14)
AST (SGOT): 16 U/L (ref <=34)
BILIRUBIN TOTAL: 0.5 mg/dL (ref 0.3–1.2)
BLOOD UREA NITROGEN: 12 mg/dL (ref 9–23)
BUN / CREAT RATIO: 13
CALCIUM: 9.4 mg/dL (ref 8.7–10.4)
CHLORIDE: 108 mmol/L — ABNORMAL HIGH (ref 98–107)
CO2: 30.2 mmol/L (ref 20.0–31.0)
CREATININE: 0.91 mg/dL
EGFR CKD-EPI (2021) MALE: >90 mL/min/{1.73_m2} (ref >=60)
GLUCOSE RANDOM: 124 mg/dL (ref 70–179)
POTASSIUM: 4 mmol/L (ref 3.4–4.8)
PROTEIN TOTAL: 6.3 g/dL (ref 5.7–8.2)
SODIUM: 144 mmol/L (ref 135–145)

## 2022-10-22 LAB — CBC
HEMATOCRIT: 38.1 % — ABNORMAL LOW (ref 39.0–48.0)
HEMOGLOBIN: 12.6 g/dL — ABNORMAL LOW (ref 12.9–16.5)
MEAN CORPUSCULAR HEMOGLOBIN CONC: 33.1 g/dL (ref 32.0–36.0)
MEAN CORPUSCULAR HEMOGLOBIN: 31.5 pg (ref 25.9–32.4)
MEAN CORPUSCULAR VOLUME: 95.2 fL (ref 77.6–95.7)
MEAN PLATELET VOLUME: 8 fL (ref 6.8–10.7)
PLATELET COUNT: 433 10*9/L (ref 150–450)
RED BLOOD CELL COUNT: 4 10*12/L — ABNORMAL LOW (ref 4.26–5.60)
RED CELL DISTRIBUTION WIDTH: 15.2 % (ref 12.2–15.2)
WBC ADJUSTED: 11.6 10*9/L — ABNORMAL HIGH (ref 3.6–11.2)

## 2022-10-22 LAB — C-REACTIVE PROTEIN: C-REACTIVE PROTEIN: <4 mg/L (ref <=10.0)

## 2022-10-22 MED ORDER — DICYCLOMINE 10 MG CAPSULE
ORAL_CAPSULE | Freq: Four times a day (QID) | ORAL | 11 refills | 30 days | Status: CP
Start: 2022-10-22 — End: 2023-10-22

## 2022-10-22 MED ORDER — PREDNISONE 10 MG TABLET
ORAL_TABLET | 0 refills | 0 days | Status: CP
Start: 2022-10-22 — End: ?

## 2022-10-22 MED ADMIN — iohexol (OMNIPAQUE) 350 mg iodine/mL solution 100 mL: 100 mL | INTRAVENOUS | @ 20:00:00 | Stop: 2022-10-22

## 2022-10-22 NOTE — Unmapped (Signed)
REASON FOR VISIT:  Crohn's    HISTORY OF PRESENT ILLNESS:  Since last visit,  06/30/2022, self stopped 6-MP because it made him feel ill.  07/05/2022, adalimumab 13.3, CRP 4mg /L  07/29/2022, CTE.  20 cm segment of wall thickening, hyperenhancement.  Marked luminal narrowing of neo-TI at anastomosis with mild upstream bowel dilatation measuring 3.2 cm.  Questionable wall thickening and narrowing in the sigmoid colon.  1.1 cm MLN.  08/08/2022, change adalimumab/6-MP to upadacitinib 45 mg.  09/06/2022, 3-4 watery bowel movements/day.  09/06/2022, CBC, CMP, GIPP, C. difficile, CRP, ferritin, TSH, B12 all negative/normal.  Fecal calprotectin 276  09/13/2022, persistent diarrhea.  Start prednisone taper.  10/11/2022, 15-20 BMs per day after stopping prednisone and despite upadacitinib 45 mg.  Resume prednisone taper at 40 mg.    Still taking upadacitinib 45 mg daily. On prednisone 40mg  daily. Having 5-10 (solid-liquid) non bloody bms per day. Taking 4-10 loperamide tablets per day. None today. No bm today so far. Last fecal incontinence 2-3 weeks ago.  No nocturnal bms in the last week.  Has fatigue. Has generalized abdominal pain-constant-sometimes worse after eating and associated with nausea. Rare vomiting. Takes ibuprofen 1-2 doses per week and naproxen 1-2x per month.    MEDICATIONS:  has a current medication list which includes the following prescription(s): acetaminophen, albuterol, alprazolam, buprenorphine-naloxone, clindamycin, divalproex er, lamotrigine, levalbuterol, loperamide, naproxen, ondansetron, prednisone, promethazine, UNABLE TO FIND, upadacitinib, and rinvoq.    ALLERGIES:  Allergies as of 10/22/2022 - Reviewed 10/11/2022   Allergen Reaction Noted    Acetaminophen Nausea And Vomiting 04/27/2012    Benadryl [diphenhydramine hcl] Anaphylaxis 04/27/2012    Sulfa (sulfonamide antibiotics) Hives 04/27/2012    Rozerem [ramelteon] Other (See Comments) 05/11/2014    Shellfish containing products Diarrhea and Nausea And Vomiting 06/04/2011       PAST MEDICAL HISTORY:  1. Crohn's disease diagnosed as ileocolitis in 2007, treated initially with Pentasa and prednisone. Infliximab with partial response. Imuran with no help. Infusion reaction during infliximab retreatment. Adalimumab provided no benefit. Certolizumab with mild improvement but plateaued despite once weekly  dosing. In 12/2007, CT with ileitis, internal fistula and abscess. 02/04/08, ielocolic resection (40cm ti) by Dr. [place] in [place]. 07/2008, diarrhea. 05/03/09, thickening of the neoterminal ileum on CT scan. 6-MP started 50 mg per day. 05/03/09, start adalimumab. 06/01/09, 6-MP increased to 75 mg. 07/19/09, desipramine to 100 mg per day without benefit. 11/10/2009, 6 inches of ileal resection (stricture) with primary anastomosis. 12/20/2009, reluctant to restart immunosuppression. 03/21/10, stopped desipramine due to lack of benefit and start Cymbalta. 05/25/10 Colonoscopy with i3 recurrence. Restart 6-MP 75mg , but declined adalimumab due to toe infections. 03/01/11, 6 inches of small bowel resected just proximal to the anastomosis. 05/22/11 start oral methotrexate. 06/04/11, CT abdomen and pelvis showed small-bowel dilation to 3.4 cm, prominent thickening of the ileocolonic anastomosis. Start ustekinumab. 10/26/11, colonoscopy showed minimal improvement. MTX switched to 25mg  Wilson.  02/20/13, colonoscopy showed I2 disease (possibly mildly improved).  Patient self stopped methotrexate due to perceived increase infectious complications.  Continued ustekinumab monotherapy.  2017, stopped ustekinumab due to incarceration.  2018, worsening symptoms, start adalimumab in prison with improvement.  08/2019, stopped adalimumab upon release from prison.  Worsening symptoms. 09/08/2020, start 6mp 75mg  daily. 12/06/2020, start adalimumab. 07/27/21, strep intermedius empyema of the left lung. and adalimumab stopped. 11/21/2021, resume 6mp 75mg . 12/06/2021, resume adalimumab. 07/29/2022, CTE with 20 cm active ileitis and proximal dilatation. 08/08/2022, change adalimumab/6-MP to upadacitinib 45 mg.  2. 06/2014, pilonidal cyst excision.  05/01/2021, pilonidal cyst excision.  12/06/2021, resume adalimumab.  3. Bipolar disorder  4. Substance use disorder  5. 09/08/20, PCV-20  6.  09/2020, QuantiFERON gold positive. 10/28/2020, [facility] health department determined patient did not need treatment for LTBI based on 2 repeat negative tests.  7.  02/09/2021, Shingrix No. 1, #2 on 12/28/21    SOCIAL HISTORY:  Continues to smoke cigarettes    FAMILY HISTORY:  No colon cancer    PHYSICAL EXAM:  BP 113/68  - Pulse 83  - Temp 37.2 ??C (98.9 ??F)  - Ht 180.3 cm (5' 11)  - Wt 72.1 kg (159 lb)  - BMI 22.18 kg/m??   Wt Readings from Last 4 Encounters:   10/22/22 72.1 kg (159 lb)   10/11/22 74.8 kg (164 lb 12.8 oz)   09/06/22 73.9 kg (163 lb)   07/05/22 70.3 kg (155 lb)     CONSTITUTIONAL: awake, alert, NAD    TEST DATA:  1. 05/03/09, CT scan with thickening of neoterminal ileum and subcm mesenteric lymphadenopathy. No stranding, abscess, or fistula.   2.08/26/2009, EGD was normal.   3.08/26/2009, colonoscopy to the neoterminal ileum. Poor bowel prep. Edema, erythema and shallow ulcerations at ICA and neoterminal ileum. Benign-appearing mild stenosis just proximal to ICA that was not traversed.  4. 10/28/2009, small bowel follow through showed 4-hour transit time, adherent small bowel loops in the pelvis that were difficult to separate, 1.5-2 cm segment of stricture in the neoterminal ileum that was 4 mm in diameter, more proximal 2.25 cm of neoterminal ileum was mildly narrowed.   5. 05/20/10, colonoscopy to neoterminal ileum with poor prep, i3 recurrence.   6. 02/07/2011, colonoscopy to 5 cm in the terminal ileum showed a linear ulcer at the ileocolonic anastomosis and severe ulceration in the neoterminal ileum that was worse than before. Terminal ileum could not be intubated more deeply due to edema and/or stricturing. 7. 06/04/11, CT abdomen and pelvis showed small-bowel dilatation to 3.4 cm, gas and stool in colon, prominent thickening at the ileocolonic anastomosis  8. 10/26/11 Colonoscopy- ICA with linear ulceration confined to the anastamotic line. >5 medium sized aphthous ulcers with normal intervening mucosa in the neoti. Slightly improved compared to prior pre-op colonoscopy.   9. 02/20/13, colonoscopy. Normal ICA. i2 disease in the distal 6 cm of TI.  Slightly improved.  10. 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  09/12/2020, CTAP.  Gallbladder wall thickening.  Marked thickening of the terminal ileum x20 cm.  2 cm mesenteric lymphadenopathy  12.  09/12/2020, right upper quadrant ultrasound.  Gallbladder sludge, wall thickening.  13.  09/14/2020, HIDA scan.  Normal.  14. 07/18/2021, colonoscopy to TI x1 cm.  Fair prep.  Nondraining sinus tract versus fistula on perianal exam.  Edema, small superficial ulcers and mild stenosis (1 cm) at ileocolonic anastomosis.  Dilated to 13.5 mm.  Anastomosis was traversed, but deeper intubation of the ileum was prevented by looping.   15. 07/05/2022, adalimumab 13.3, CRP 4mg /L  16. 07/29/2022, CTE.  20 cm segment of wall thickening, hyperenhancement.  Marked luminal narrowing of neo-TI at anastomosis with mild upstream bowel dilatation measuring 3.2 cm.  Questionable wall thickening and narrowing in the sigmoid colon.  1.1 cm MLN.  17. 09/06/2022, Fecal calprotectin 276    ASSESSMENT:  1. Ileal Crohn's disease.  Stricturing phenotype.  Currently moderately symptomatic with diarrhea, abdominal pain.  Unclear whether he is having obstructive symptoms.  Recommend restaging CT today.  If  significantly active Crohn's disease, then consider changing upadacitinib to risankizumab.  Reviewed risks of risankizumab including risk of infection.  If minimal to no disease activity, then symptoms may be due to medications or functional GI disorder in which case we would continue upadacitinib therapy.  Plan to taper prednisone as tolerated to reduce corticosteroid exposure.  Dicyclomine as needed for abdominal pain.  Also, he needs to abstain from all NSAIDs as this could certainly cause ileitis.  2. Diarrhea.  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  Loperamide as needed for diarrhea.  3. Recurrent nausea.  Possibly due to pain medication.  Less likely due to bowel obstruction.  Treat Crohn's as above.  Ondansetron as needed.  4.  Tobacco use disorder.  Smoking cessation encouraged      RECOMMENDATIONS AND PLAN:  Patient Instructions   -CT today.  If active Crohn's, then consider switching rinvoq to skyrizi.  I will let you know if we need to do this.  -Continue prednisone and taper. I sent in refills.  -Bentyl as needed for abdominal pain.  -Avoid ibuprofen, meloxicam, naproxen, and other NSAIDs.  -Bloodwork today.  -OK to take imodium as needed for diarrhea and dicyclomine/bentyl as needed for abdominal pain.  -Continue efforts to quit smoking  -Telehealth visit with me in 4 months    I personally spent 45 minutes face-to-face and non-face-to-face in the care of this patient, which includes all pre, intra, and post visit time on the date of service.  All documented time was specific to the E/M visit and does not include any procedures that may have been performed.

## 2022-10-22 NOTE — Unmapped (Addendum)
-  CT today.  If active Crohn's, then consider switching rinvoq to skyrizi.  I will let you know if we need to do this.  -Continue prednisone and taper. I sent in refills.  -Bentyl as needed for abdominal pain.  -Avoid ibuprofen, meloxicam, naproxen, and other NSAIDs.  -Bloodwork today.  -OK to take imodium as needed for diarrhea and dicyclomine/bentyl as needed for abdominal pain.  -Continue efforts to quit smoking  -Telehealth visit with me in 4 months

## 2022-10-25 DIAGNOSIS — K50918 Crohn's disease, unspecified, with other complication: Principal | ICD-10-CM

## 2022-10-25 NOTE — Unmapped (Signed)
THERAPY PLAN ORDER ENTRY DOCUMENTATION    The following order(s) have been placed for insurance authorization:    Therapy Plan Order  Medication: Risankizumab  Dose & Frequency: crohns dosing  Diagnosis: Crohn's Disease  Treatment Center: [facility]    TB Check; Yes Cleared by local health dept in 2022 after +quant gold result. Ok to proceed per Dr. Stevphen Rochester     Requesting Physician: Dr. Noel Gerold    Notes: stop Rinvoq once able to start Skyrizim reviewed results and plan with patient per Dr. Kerry Kass request.       Carolyn Stare, RN  [phone] office

## 2022-10-25 NOTE — Unmapped (Signed)
-----   Message from Noel Gerold, MD sent at 10/24/2022  5:27 PM EDT -----  Regarding: Change upadacitinib to risankizumab  Can you please let him know that the Crohn's disease seems to be much better on his CT scan than before.  However, it is still active.  I would recommend that he change upadacitinib to risankizumab.  Would you please let him know about this and help him make the switch?  He should taper the prednisone as previously discussed.  Regarding his ongoing diarrhea, if the loperamide does not help, then he can try cholestyramine 4 g by mouth 30 minutes before each meal.    Thanks  ----- Message -----  From: Interface, Rad Results In  Sent: 10/23/2022   1:21 PM EDT  To: Luanne Bras, MD

## 2022-10-26 NOTE — Unmapped (Signed)
Per Dr. Stevphen Rochester, patient instructed to continue rinvoq for now but stop 1 week prior to first scheduled infusion.

## 2022-10-29 DIAGNOSIS — K50912 Crohn's disease, unspecified, with intestinal obstruction: Principal | ICD-10-CM

## 2022-10-29 MED ORDER — RINVOQ 45 MG TABLET,EXTENDED RELEASE
ORAL_TABLET | Freq: Every day | ORAL | 0 refills | 84 days
Start: 2022-10-29 — End: ?

## 2022-10-29 NOTE — Unmapped (Signed)
[facility] Refill Coordination Note    Specialty Medication(s) to be Shipped:   Inflammatory Disorders: Rinvoq    Other medication(s) to be shipped: No additional medications requested for fill at this time     Dakota Townsend, DOB: 01-31-1980  Phone: [phone] (home)       All above HIPAA information was verified with patient.     Was a Nurse, learning disability used for this call? No    Completed refill call assessment today to schedule patient's medication shipment from the [facility] [phone]).  All relevant notes have been reviewed.     Specialty medication(s) and dose(s) confirmed: Regimen is correct and unchanged.   Changes to medications: Dakota Townsend reports no changes at this time.  Changes to insurance: No  New side effects reported not previously addressed with a pharmacist or physician: None reported  Questions for the pharmacist: No    Confirmed patient received a Conservation officer, historic buildings and a Surveyor, mining with first shipment. The patient will receive a drug information handout for each medication shipped and additional FDA Medication Guides as required.       DISEASE/MEDICATION-SPECIFIC INFORMATION        N/A    SPECIALTY MEDICATION ADHERENCE     Medication Adherence    Patient reported X missed doses in the last month: 0  Specialty Medication: Rinvoq 30 mg tab  Patient is on additional specialty medications: No              Were doses missed due to medication being on hold? No    Humira 40/0.4 mg/ml: 05 days of medicine on hand        REFERRAL TO PHARMACIST     Referral to the pharmacist: Not needed      [facility]     Shipping address confirmed in Epic.       Delivery Scheduled: Yes, Expected medication delivery date: 10/25.  However, Rx request for refills was sent to the provider as there are none remaining.     Medication will be delivered via UPS to the prescription address in Epic WAM.    Dakota Townsend Shared [facility] Specialty Technician

## 2022-10-30 MED ORDER — RINVOQ 45 MG TABLET,EXTENDED RELEASE
ORAL_TABLET | Freq: Every day | ORAL | 0 refills | 84 days
Start: 2022-10-30 — End: ?

## 2022-10-30 NOTE — Unmapped (Signed)
Specialty Medication(s): Rinvoq    Dakota Townsend has been dis-enrolled from the [employer] [facility] specialty pharmacy services due to a change in therapy. The patient is transitioning to [place] and will be completing 3 IV induction doses prior to starting [employer] OBI.     Additional information provided to the patient: n/a    Dakota Townsend, PharmD  [facility] Specialty Pharmacist

## 2022-10-30 NOTE — Unmapped (Signed)
Skyrizi approved. Patient notified.  Request to [facility] to schedule patient for start asap, scheduled for start 11/01/2022.  Referral placed to IBD pharmacist for follow up. Patient notified.

## 2022-11-01 ENCOUNTER — Ambulatory Visit: Admit: 2022-11-01 | Discharge: 2022-11-02 | Payer: MEDICARE

## 2022-11-01 DIAGNOSIS — K50918 Crohn's disease, unspecified, with other complication: Principal | ICD-10-CM

## 2022-11-29 ENCOUNTER — Ambulatory Visit: Admit: 2022-11-29 | Discharge: 2022-11-30 | Payer: MEDICARE

## 2022-11-29 DIAGNOSIS — K50918 Crohn's disease, unspecified, with other complication: Secondary | ICD-10-CM | POA: Diagnosis not present

## 2022-12-05 DIAGNOSIS — K50912 Crohn's disease, unspecified, with intestinal obstruction: Principal | ICD-10-CM

## 2022-12-05 MED ORDER — SKYRIZI 360 MG/2.4 ML (150 MG/ML) SUBCUTANEOUS WEARABLE INJECTOR
2 refills | 0 days | Status: CP
Start: 2022-12-05 — End: ?
  Filled 2023-01-03: qty 2.4, 56d supply, fill #0

## 2022-12-10 DIAGNOSIS — K50912 Crohn's disease, unspecified, with intestinal obstruction: Principal | ICD-10-CM

## 2022-12-11 NOTE — Unmapped (Signed)
 [facility] Specialty Medication Onboarding    Specialty Medication: Careers adviser  Prior Authorization: Approved   Financial Assistance: No - copay  <$25  Final Copay/Day Supply: $0 / 56 days    Insurance Restrictions: None     Notes to Pharmacist:   Credit Card on File: not applicable    The triage team has completed the benefits investigation and has determined that the patient is able to fill this medication at [facility]. Please contact the patient to complete the onboarding or follow up with the prescribing physician as needed.

## 2022-12-11 NOTE — Unmapped (Signed)
Infusions scheduled:    Dakota Townsend Q8W approved for $0 copay.   Skyrizi requires three IV inductions doses completed at weeks 0, 4, and 8 with the maintenance subcutaneous dose starting at week 12.   Dakota Townsend completed 1st & 2nd IV induction doses on 11/01/2022 & 11/29/2022. The 3rd induction IV dose is scheduled for 12/28/2022.     Patient outreach is scheduled the week of December 23rd which is 3 weeks before first subcutaneous dose is due on 01/25/2023.  Sent patient Mychart message with link to video and communicated outreach.       Teofilo Pod, PharmD   [facility]  [address], [place], [place] [address]  Phone: [phone] - Fax. [phone]

## 2022-12-17 DIAGNOSIS — K50912 Crohn's disease, unspecified, with intestinal obstruction: Principal | ICD-10-CM

## 2022-12-28 ENCOUNTER — Ambulatory Visit: Admit: 2022-12-28 | Discharge: 2022-12-29 | Payer: MEDICARE

## 2022-12-28 DIAGNOSIS — K50918 Crohn's disease, unspecified, with other complication: Secondary | ICD-10-CM | POA: Diagnosis not present

## 2022-12-28 LAB — COMPREHENSIVE METABOLIC PANEL
ALBUMIN: 3.5 g/dL (ref 3.4–5.0)
ALKALINE PHOSPHATASE: 83 U/L (ref 46–116)
ALT (SGPT): 21 U/L (ref 10–49)
ANION GAP: 12 mmol/L (ref 5–14)
AST (SGOT): 21 U/L (ref <=34)
BILIRUBIN TOTAL: 0.2 mg/dL — ABNORMAL LOW (ref 0.3–1.2)
BLOOD UREA NITROGEN: 9 mg/dL (ref 9–23)
BUN / CREAT RATIO: 11
CALCIUM: 9.2 mg/dL (ref 8.7–10.4)
CHLORIDE: 104 mmol/L (ref 98–107)
CO2: 24 mmol/L (ref 20.0–31.0)
CREATININE: 0.8 mg/dL (ref 0.73–1.18)
EGFR CKD-EPI (2021) MALE: >90 mL/min/{1.73_m2} (ref >=60)
GLUCOSE RANDOM: 96 mg/dL (ref 70–179)
POTASSIUM: 4.4 mmol/L (ref 3.4–4.8)
PROTEIN TOTAL: 6.4 g/dL (ref 5.7–8.2)
SODIUM: 140 mmol/L (ref 135–145)

## 2022-12-28 LAB — CBC
HEMATOCRIT: 39.3 % (ref 39.0–48.0)
HEMOGLOBIN: 13.4 g/dL (ref 12.9–16.5)
MEAN CORPUSCULAR HEMOGLOBIN CONC: 34.1 g/dL (ref 32.0–36.0)
MEAN CORPUSCULAR HEMOGLOBIN: 32.2 pg (ref 25.9–32.4)
MEAN CORPUSCULAR VOLUME: 94.4 fL (ref 77.6–95.7)
MEAN PLATELET VOLUME: 8.9 fL (ref 6.8–10.7)
PLATELET COUNT: 366 10*9/L (ref 150–450)
RED BLOOD CELL COUNT: 4.16 10*12/L — ABNORMAL LOW (ref 4.26–5.60)
RED CELL DISTRIBUTION WIDTH: 13.2 % (ref 12.2–15.2)
WBC ADJUSTED: 7.2 10*9/L (ref 3.6–11.2)

## 2022-12-28 MED ORDER — EMPTY CONTAINER
3 refills | 0.00 days
Start: 2022-12-28 — End: ?

## 2022-12-28 MED ADMIN — risankizumab-rzaa (SKYRIZI) 600 mg in sodium chloride (NS) 0.9 % 100 mL IVPB: 600 mg | INTRAVENOUS | @ 16:00:00 | Stop: 2022-12-28

## 2022-12-28 NOTE — Unmapped (Signed)
Pt presents for Skyrizi infusion. Pt denies any recent infections, fevers, antibiotic use. VSS, IV placed. Labs drawn. Pt educated on side effects and verbalized understanding.     1115 Skyrizi 600mg  started to infuse over at least 1hr per order    [employer] infusion finished. Pt tolerated with no ill effects. IV flushed with D5. VSS.    Pt tolerated infusion with no ill effects. IV d/c'ed, gauze and coban applied. Pt discharged from clinic in NAD.

## 2022-12-28 NOTE — Unmapped (Signed)
[facility]    Patient Onboarding/Medication Counseling    Sent MyChart message with instructions and link to manufacture's video     Mr.Dakota Townsend is a 42 y.o. male with Crohn's who I am counseling today on initiation of therapy.  I am speaking to the patient.    Was a Nurse, learning disability used for this call? No    Verified patient's date of birth / HIPAA.    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    Administration:     Air traffic controller all supplies needed for injection on a clean, flat working surface: Plastic tray containing the On-body injector and prefilled cartridge, alcohol swab, sharps container, etc.  Remove unopened carton from the refrigerator and allow it to warm up to room temperature for at least 45 but no more than 90 minutes.  Look at the medication label - look for correct medication, correct dose, and check the expiration date  Remove the On-body injector and prefilled cartridge from plastic tray  Look at the On-body injector - check that it is intact and undamaged. Do NOT close the gray door before the prefilled cartridge is loaded  Look at the prefilled cartridge - the liquid should appear clear and colorless to slightly yellow, you may see tiny white or clear particles. Do NOT twist or remove cartridge top.  Use an alcohol swab to clean the smaller bottom tip of the prefilled cartridge and let it air dry. Do not touch the smaller bottom tip after cleaning.  Insert the smaller bottom tip into the On-Body injector first. Firmly push down until you hear a click. There may be a few drops of medicine on the back of the On-body Injector. That is normal. Close the gray door and squeeze firmly until it snaps closed.   You MUST start the injection within 5 minutes of loading the prefilled cartridge.   Select injection site - you can use the front of your thigh or your belly (but not the area 2 inches around your belly button)  Prepare injection site - wash your hands and clean the skin at the injection site with an alcohol swab and let it air dry, do not touch the injection site again before the injection  Peel the green tabs on the back of the On-body injector to expose the adhesive. This will activate the On-body injector and cause the status light to turn BLUE.   For the belly, move and hold the skin to create a firm, flat surface. You do not need to pull the skin if injecting on the front of the thighs.  When the blue light flashes, it is ready to start the injection.  Place the On-body injector onto the cleaned skin and then firmly press the gray button until  you hear a click. This will start the injection and the light will continuously flash GREEN. This may take up to 5 minutes to complete the full dose.  The On-body Injector will automatically stop when the injection is finished. You will hear beeps and the status light will change to SOLID GREEN.   Remove the On-Body Injector by carefully peeling the adhesive from your skin. Avoid touching the needle or needle cover on the back of the On-Body Injector. You will hear several beeps and the status light will turn off.   Dispose of the used On-Body Injector immediately in Engineer, water.      Adherence/Missed dose instructions:  If your injection is given more than 7 days after your scheduled injection date - consult your pharmacist for additional instructions on how to adjust your dosing schedule.    Goals of Therapy     Crohn's Disease  Achieve remission of symptoms  Maintain remission of symptoms  Minimize long-term systemic glucocorticoid use  Prevent need for surgical procedures  Maintenance of effective psychosocial functioning    Side Effects & Monitoring Parameters     Injection site reaction (redness, irritation, inflammation localized to the site of administration)  Signs of a common cold - minor sore throat, runny or stuffy nose, etc.  Felling tired/weak  Headache  Stomach, joint or back pain    The following side effects should be reported to the provider:  Signs of a hypersensitivity reaction - rash; hives; itching; red, swollen, blistered, or peeling skin; wheezing; tightness in the chest or throat; difficulty breathing, swallowing, or talking; swelling of the mouth, face, lips, tongue, or throat; etc.  Reduced immune function - report signs of infection such as fever; chills; body aches; very bad sore throat; ear or sinus pain; cough; more sputum or change in color of sputum; pain with passing urine; wound that will not heal, etc.  Also at a slightly higher risk of some malignancies (mainly skin and blood cancers) due to this reduced immune function.  In the case of signs of infection - the patient should hold the next dose of Skyrizi?? and call your primary care provider to ensure adequate medical care.  Treatment may be resumed when infection is treated and patient is asymptomatic.  Flu-like symptoms  Warm, red, or painful skin or sores on the body  Severe diarrhea or stomach pain      Contraindications, Warnings, & Precautions     Have your bloodwork checked as you have been told by your prescriber  Talk with your doctor if you are pregnant, planning to become pregnant, or breastfeeding  Discuss the possible need for holding your dose(s) of Skyrizi?? when a planned procedure is scheduled with the prescriber as it may delay healing/recovery timeline       Drug/Food Interactions     Medication list reviewed in Epic. The patient was instructed to inform the care team before taking any new medications or supplements. No drug interactions identified.   Talk with you prescriber or pharmacist before receiving any live vaccinations while taking this medication and after you stop taking it    Storage, Handling Precautions, & Disposal     Store this medication in the refrigerator.  Do not freeze   If needed, you may store at room temperature for up to 24 hours  Store in original packaging, protected from light  Do not shake  Dispose of used syringes/pens in a sharps disposal container  Current Medications (including OTC/herbals), Comorbidities and Allergies     Current Outpatient Medications   Medication Sig Dispense Refill    acetaminophen (TYLENOL) 500 MG tablet Take 2 tablets (1,000 mg total) by mouth every four (4) hours as needed.      albuterol HFA 90 mcg/actuation inhaler Inhale 2 puffs every four (4) hours as needed.      ALPRAZolam (XANAX) 0.25 MG tablet Take 1 tablet (0.25 mg total) by mouth daily as needed.      buprenorphine-naloxone (SUBOXONE) 8-2 mg sublingual film TAKE ONE FILM DAILY      dicyclomine (BENTYL) 10 mg capsule Take 1 capsule (10 mg total) by mouth Four (4) times a day (before meals and nightly). 120 capsule 11    divalproex ER (DEPAKOTE ER) 500 MG extended released 24 hr tablet TAKE 2 TABLETS BY MOUTH WITH SUPPER DAILY      lamoTRIgine (LAMICTAL) 200 MG tablet Take 1 tablet (200 mg total) by mouth.      levalbuterol (XOPENEX HFA) 45 mcg/actuation inhaler Inhale 1 puff every four (4) hours as needed.      loperamide (IMODIUM) 2 mg capsule TAKE 1-2 CAPSULES BY MOUTH EVERY 4 HOURS AS NEEDED FOR DIARRHEA 120 capsule 5    naproxen (NAPROSYN) 250 MG tablet Take 1 tablet (250 mg total) by mouth Three (3) times a day as needed.      ondansetron (ZOFRAN-ODT) 4 MG disintegrating tablet Dissolve 1 tablet (4 mg total) in the mouth every eight (8) hours as needed for nausea. 90 tablet 5    predniSONE (DELTASONE) 10 MG tablet 40mg  qday x 7d, 30mg  qday x 7d, 20mg  qday x 7d, 15mg  qday x 7d, 10mg  qday x 7d, 5mg  qd x 7d. 100 tablet 0    promethazine (PHENERGAN) 25 MG tablet 0.5-1 tab po q 8hrs prn nausea 45 tablet 2    risankizumab-rzaa (SKYRIZI) 360 mg/2.4 mL (150 mg/mL) Injt Inject the contents of 1 cartridge (360mg ) under the skin every 8 weeks 2.4 mL 2    UNABLE TO FIND Take 1 tablet by mouth in the morning. 5-Htp.       No current facility-administered medications for this visit.       Allergies   Allergen Reactions    Acetaminophen Nausea And Vomiting    Benadryl [Diphenhydramine Hcl] Anaphylaxis    Sulfa (Sulfonamide Antibiotics) Hives    Rozerem [Ramelteon] Other (See Comments)     Pt reports hallucinations, muscle contortions  & dry eyes    Shellfish Containing Products Diarrhea and Nausea And Vomiting       Patient Active Problem List   Diagnosis    Crohn's disease of intestine (CMS-HCC)    Tobacco use disorder    Intestinal obstruction (CMS-HCC)    Intestinal bypass or anastomosis status    Nausea with vomiting    Esophageal reflux    Major depression    Marijuana use    Chronic abdominal pain    Pilonidal cyst       Reviewed and up to date in Epic.    Appropriateness of Therapy     Acute infections noted within Epic:  No active infections  Patient reported infection: None    Is the medication and dose appropriate based on diagnosis, medication list, comorbidities, allergies, medical history, patient???s ability to self-administer the medication, and therapeutic goals? Yes    Prescription has been clinically reviewed: Yes      Baseline Quality of Life Assessment  How many days over the past month did your Crohn's  keep you from your normal activities? For example, brushing your teeth or getting up in the morning. Patient declined to answer    Financial Information     Medication Assistance provided: Prior Authorization    Anticipated copay of $0 reviewed with patient. Verified delivery address.    Delivery Information     Scheduled delivery date: 12/27    Expected start date: 01/25/2023      Medication will be delivered via UPS to the prescription address in [facility].  This shipment will not require a signature.      Explained the services we provide at [facility] and that each month we would call to set up refills.  Stressed importance of returning phone calls so that we could ensure they receive their medications in time each month.  Informed patient that we should be setting up refills 7-10 days prior to when they will run out of medication.  A pharmacist will reach out to perform a clinical assessment periodically.  Informed patient that a welcome packet, containing information about our pharmacy and other support services, a Notice of Privacy Practices, and a drug information handout will be sent.      The patient or caregiver noted above participated in the development of this care plan and knows that they can request review of or adjustments to the care plan at any time.      Patient or caregiver verbalized understanding of the above information as well as how to contact the pharmacy at [phone] option 4 with any questions/concerns.  The pharmacy is open Monday through Friday 8:30am-4:30pm.  A pharmacist is available 24/7 via pager to answer any clinical questions they may have.    Patient Specific Needs     Does the patient have any physical, cognitive, or cultural barriers? No    Does the patient have adequate living arrangements? (i.e. the ability to store and take their medication appropriately) Yes    Did you identify any home environmental safety or security hazards? No    Patient prefers to have medications discussed with  Patient     Is the patient or caregiver able to read and understand education materials at a high school level or above? Yes    Patient's primary language is  English     Is the patient high risk? No    SOCIAL DETERMINANTS OF HEALTH     At the [facility], we have learned that life circumstances - like trouble affording food, housing, utilities, or transportation can affect the health of many of our patients.   That is why we wanted to ask: are you currently experiencing any life circumstances that are negatively impacting your health and/or quality of life? No    Social Drivers of Health     Food Insecurity: No Food Insecurity (08/28/2021)    Received from [facility], [facility]    Hunger Vital Sign     Worried About Running Out of Food in the Last Year: Never true     Ran Out of Food in the Last Year: Never true   Internet Connectivity: Not on file   Housing/Utilities: Not on file   Tobacco Use: High Risk (11/29/2022)    Patient History     Smoking Tobacco Use: Every Day     Smokeless Tobacco Use: Never     Passive Exposure: Not on file   Transportation Needs:  No Transportation Needs (08/28/2021)    Received from [facility], [facility]    [facility] - Transportation     Lack of Transportation (Medical): No     Lack of Transportation (Non-Medical): No   Alcohol Use: Not on file   Interpersonal Safety: Not on file   Physical Activity: Inactive (08/28/2021)    Received from [facility], [facility]    Exercise Vital Sign     Days of Exercise per Week: 0 days     Minutes of Exercise per Session: 0 min   Intimate Partner Violence: Not At Risk (08/28/2021)    Received from [facility], [facility]    Humiliation, Afraid, Rape, and Kick questionnaire     Fear of Current or Ex-Partner: No     Emotionally Abused: No     Physically Abused: No     Sexually Abused: No   Stress: No Stress Concern Present (08/28/2021)    Received from [facility], [facility]    [facility] - Occupational Stress Questionnaire     Feeling of Stress : Not at all   Substance Use: Not on file (11/18/2022)   Social Connections: Moderately Integrated (08/28/2021)    Received from [facility], [facility]    Social Connection and Isolation Panel [NHANES]     Frequency of Communication with Friends and Family: More than three times a week     Frequency of Social Gatherings with Friends and Family: More than three times a week     Attends Religious Services: More than 4 times per year     Active Member of [employer] or Organizations: No     Attends Engineer, structural: More than 4 times per year     Marital Status: Never married   Physicist, medical Strain: Low Risk  (08/28/2021)    Received from [employer] [facility], [facility]    Overall Financial Resource Strain (CARDIA)     Difficulty of Paying Living Expenses: Not hard at all   Depression: Not on file   Health Literacy: Not on file       Would you be willing to receive help with any of the needs that you have identified today? Not applicable       Teofilo Pod, PharmD  [facility] Specialty Pharmacist

## 2022-12-29 NOTE — Unmapped (Signed)
REASON FOR VISIT:  Crohn's    HISTORY OF PRESENT ILLNESS:  Since last visit,  10/22/2022, CTE.  4.5 cm enhancement and mild narrowing of neo-TI with proximal dilatation up to 3.1 cm.  Short segment 2.9 cm thickening and enhancement of the rectosigmoid.  10/22/2022, CBC, CMP, CRP normal except WBC 11.6, hemoglobin 12.6  11/01/2022, change upadacitinib to risankizumab.  12/28/2022, CMP, CBC normal    On skyrizi. No side effects.  May feel slightly better in terms of gi symptoms--abdominal pain and nausea.  Seems to have breakthrough n/v/ periumbilical abd pn/diarrhea 1 week prior to skyrizi. Still with fatigue.  Has 0-10bms per day. Loose while taking 8 loperamide per day. No nocturnal bms.  Mild postprandial nausea and abdominal pain once per week.  Was severe about 1 month ago.    MEDICATIONS:  has a current medication list which includes the following prescription(s): acetaminophen, albuterol, alprazolam, buprenorphine-naloxone, dicyclomine, divalproex er, empty container, lamotrigine, levalbuterol, loperamide, naproxen, ondansetron, promethazine, skyrizi, and UNABLE TO FIND.    ALLERGIES:  Allergies as of 12/31/2022 - Reviewed 12/28/2022   Allergen Reaction Noted    Acetaminophen Nausea And Vomiting 04/27/2012    Benadryl [diphenhydramine hcl] Anaphylaxis 04/27/2012    Sulfa (sulfonamide antibiotics) Hives 04/27/2012    Rozerem [ramelteon] Other (See Comments) 05/11/2014    Shellfish containing products Diarrhea and Nausea And Vomiting 06/04/2011       PAST MEDICAL HISTORY:  1. Crohn's disease diagnosed as ileocolitis in 2007, treated initially with Pentasa and prednisone. Infliximab with partial response. Imuran with no help. Infusion reaction during infliximab retreatment. Adalimumab provided no benefit. Certolizumab with mild improvement but plateaued despite once weekly  dosing. In 12/2007, CT with ileitis, internal fistula and abscess. 02/04/08, ielocolic resection (40cm ti) by Dr. El Rito Sever in [place]. 07/2008, diarrhea. 05/03/09, thickening of the neoterminal ileum on CT scan. 6-MP started 50 mg per day. 05/03/09, start adalimumab. 06/01/09, 6-MP increased to 75 mg. 07/19/09, desipramine to 100 mg per day without benefit. 11/10/2009, 6 inches of ileal resection (stricture) with primary anastomosis. 12/20/2009, reluctant to restart immunosuppression. 03/21/10, stopped desipramine due to lack of benefit and start Cymbalta. 05/25/10 Colonoscopy with i3 recurrence. Restart 6-MP 75mg , but declined adalimumab due to toe infections. 03/01/11, 6 inches of small bowel resected just proximal to the anastomosis. 05/22/11 start oral methotrexate. 06/04/11, CT abdomen and pelvis showed small-bowel dilation to 3.4 cm, prominent thickening of the ileocolonic anastomosis. Start ustekinumab. 10/26/11, colonoscopy showed minimal improvement. MTX switched to 25mg  [place].  02/20/13, colonoscopy showed I2 disease (possibly mildly improved).  Patient self stopped methotrexate due to perceived increase infectious complications.  Continued ustekinumab monotherapy.  2017, stopped ustekinumab due to incarceration.  2018, worsening symptoms, start adalimumab in prison with improvement.  08/2019, stopped adalimumab upon release from prison.  Worsening symptoms. 09/08/2020, start 6mp 75mg  daily. 12/06/2020, start adalimumab. 07/27/21, strep intermedius empyema of the left lung. and adalimumab stopped. 11/21/2021, resume 6mp 75mg . 12/06/2021, resume adalimumab. 07/29/2022, CTE with 20 cm active ileitis and proximal dilatation. 08/08/2022, change adalimumab/6-MP to upadacitinib 45 mg. 11/01/2022, persistent disease on CT,  change upadacitinib to risankizumab.  2. 06/2014, pilonidal cyst excision.  05/01/2021, pilonidal cyst excision.  12/06/2021, resume adalimumab.  3. Bipolar disorder  4. Substance use disorder  5. 09/08/20, PCV-20  6.  09/2020, QuantiFERON gold positive. 10/28/2020, [facility] health department determined patient did not need treatment for LTBI based on 2 repeat negative tests.  7.  02/09/2021, Shingrix No. 1, #2 on 12/28/21    SOCIAL  HISTORY:  Continues to smoke cigarettes (1 pack per week)    FAMILY HISTORY:  No colon cancer    PHYSICAL EXAM:  BP 117/82  - Pulse 81  - Temp 36.4 ??C (97.6 ??F) (Temporal)  - Ht 180.3 cm (5' 11)  - Wt 74.8 kg (165 lb)  - BMI 23.01 kg/m??   Wt Readings from Last 4 Encounters:   12/31/22 74.8 kg (165 lb)   12/28/22 74.8 kg (165 lb)   11/29/22 73.7 kg (162 lb 6.4 oz)   11/01/22 70.3 kg (155 lb)     CONSTITUTIONAL: awake, alert, NAD  Abdomen: Mild RLQ ttp    TEST DATA:  1. 05/03/09, CT scan with thickening of neoterminal ileum and subcm mesenteric lymphadenopathy. No stranding, abscess, or fistula.   2.08/26/2009, EGD was normal.   3.08/26/2009, colonoscopy to the neoterminal ileum. Poor bowel prep. Edema, erythema and shallow ulcerations at ICA and neoterminal ileum. Benign-appearing mild stenosis just proximal to ICA that was not traversed.  4. 10/28/2009, small bowel follow through showed 4-hour transit time, adherent small bowel loops in the pelvis that were difficult to separate, 1.5-2 cm segment of stricture in the neoterminal ileum that was 4 mm in diameter, more proximal 2.25 cm of neoterminal ileum was mildly narrowed.   5. 05/20/10, colonoscopy to neoterminal ileum with poor prep, i3 recurrence.   6. 02/07/2011, colonoscopy to 5 cm in the terminal ileum showed a linear ulcer at the ileocolonic anastomosis and severe ulceration in the neoterminal ileum that was worse than before. Terminal ileum could not be intubated more deeply due to edema and/or stricturing.   7. 06/04/11, CT abdomen and pelvis showed small-bowel dilatation to 3.4 cm, gas and stool in colon, prominent thickening at the ileocolonic anastomosis  8. 10/26/11 Colonoscopy- ICA with linear ulceration confined to the anastamotic line. >5 medium sized aphthous ulcers with normal intervening mucosa in the neoti. Slightly improved compared to prior pre-op colonoscopy. 9. 02/20/13, colonoscopy. Normal ICA. i2 disease in the distal 6 cm of TI.  Slightly improved.  10. 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  09/12/2020, CTAP.  Gallbladder wall thickening.  Marked thickening of the terminal ileum x20 cm.  2 cm mesenteric lymphadenopathy  12.  09/12/2020, right upper quadrant ultrasound.  Gallbladder sludge, wall thickening.  13.  09/14/2020, HIDA scan.  Normal.  14. 07/18/2021, colonoscopy to TI x1 cm.  Fair prep.  Nondraining sinus tract versus fistula on perianal exam.  Edema, small superficial ulcers and mild stenosis (1 cm) at ileocolonic anastomosis.  Dilated to 13.5 mm.  Anastomosis was traversed, but deeper intubation of the ileum was prevented by looping.   15. 07/05/2022, adalimumab 13.3, CRP 4mg /L  16. 07/29/2022, CTE.  20 cm segment of wall thickening, hyperenhancement.  Marked luminal narrowing of neo-TI at anastomosis with mild upstream bowel dilatation measuring 3.2 cm.  Questionable wall thickening and narrowing in the sigmoid colon.  1.1 cm MLN.  17. 09/06/2022, Fecal calprotectin 276  18. 10/22/2022, CTE.  4.5 cm enhancement and mild narrowing of neo-TI with proximal dilatation up to 3.1 cm.  Short segment 2.9 cm thickening and enhancement of the rectosigmoid.    ASSESSMENT:  1. Ileal Crohn's disease.  Stricturing phenotype.  Currently moderately symptomatic with diarrhea and postprandial abdominal pain.  I suspect that the diarrhea is largely due to bile salt malabsorption.  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  Minimally improved since  switching to risankizumab.  However, I do not think risankizumab has had enough time to become fully effective.  I would recommend giving it 3 more months.  If postprandial symptoms have not improved by that point, then consider referral to colorectal surgeon for short segment ileal resection.  Would continue risankizumab postoperatively indefinitely. Reviewed risks including risk of infection.  2.  Intermittent diarrhea.  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  Continue loperamide for now.  Offered the patient Colestid.  However, due to his affect on absorption of the many other medications that he takes, we will hold off on starting this for now.  3. Tobacco use disorder.  Smoking cessation counseling.  4.  Chronic recurrent nausea.  Possibly due to pain medication in combination with ileal stricture.  Ondansetron as needed.  5.  Medically immune suppressed.  In need of vaccination.  Patient declined vaccines today.    RECOMMENDATIONS AND PLAN:  Patient Instructions   -Continue efforts to stop smoking  -Continue skyrizi injections every 8 weeks  -Talk with optometrist about blurry vision  -Continue loperamide as needed for diarrhea.  If desired, could try colestipol to treat diarrhea.  However, this could interfere with absorption of other oral medications.  -Zofran as needed for nausea  -If meal related abdominal bloating/pain are worse, or no better by March, then let me know and I will refer you to a surgeon to talk about the pros and cons of removing the short segment of small bowel that is inflamed and narrowed.  -Telehealth visit with me in 3 months.

## 2022-12-31 ENCOUNTER — Ambulatory Visit: Admit: 2022-12-31 | Discharge: 2023-01-01 | Payer: MEDICARE | Attending: Gastroenterology | Primary: Gastroenterology

## 2022-12-31 DIAGNOSIS — F1120 Opioid dependence, uncomplicated: Principal | ICD-10-CM

## 2022-12-31 DIAGNOSIS — K50912 Crohn's disease, unspecified, with intestinal obstruction: Principal | ICD-10-CM

## 2022-12-31 NOTE — Unmapped (Addendum)
-  Continue efforts to stop smoking  -Continue skyrizi injections every 8 weeks  -Talk with optometrist about blurry vision  -Continue loperamide as needed for diarrhea.  If desired, could try colestipol to treat diarrhea.  However, this could interfere with absorption of other oral medications.  -Zofran as needed for nausea  -If meal related abdominal bloating/pain are worse, or no better by March, then let me know and I will refer you to a surgeon to talk about the pros and cons of removing the short segment of small bowel that is inflamed and narrowed.  -Telehealth visit with me in 3 months.

## 2023-01-03 MED FILL — EMPTY CONTAINER: 120 days supply | Qty: 1 | Fill #0

## 2023-01-11 MED ORDER — LOPERAMIDE 2 MG CAPSULE
ORAL_CAPSULE | 5 refills | 0.00 days | Status: CP
Start: 2023-01-11 — End: ?

## 2023-01-11 NOTE — Unmapped (Signed)
Patient is requesting the following refill  Requested Prescriptions     Pending Prescriptions Disp Refills    loperamide (IMODIUM) 2 mg capsule [Pharmacy Med Name: LOPERAMIDE 2 MG CAPSULE] 120 capsule 5     Sig: TAKE 1-2 CAPSULES BY MOUTH EVERY 4 HOURS AS NEEDED FOR DIARRHEA       Recent Visits  Date Type Provider Dept   12/31/22 Office Visit Stevphen Rochester, Brent Bulla, MD [facility]   10/22/22 Office Visit Stevphen Rochester, Brent Bulla, MD [facility]   10/11/22 Office Visit Jackquline Berlin, CPP [facility] [place]   07/05/22 Office Visit Stevphen Rochester, Brent Bulla, MD [facility]   Showing recent visits within past 365 days and meeting all other requirements  Future Appointments  Date Type Provider Dept   01/24/23 Appointment Jackquline Berlin, CPP [facility]   03/25/23 Appointment Stevphen Rochester, Brent Bulla, MD [facility]   Showing future appointments within next 365 days and meeting all other requirements          Labs: Not applicable this refill      refills authorized

## 2023-01-14 DIAGNOSIS — K50912 Crohn's disease, unspecified, with intestinal obstruction: Principal | ICD-10-CM

## 2023-01-23 NOTE — Unmapped (Signed)
[facility]  CLINICAL PHARMACIST PRACTITIONER VISIT    01/23/2023    HPI:     I saw Dakota Townsend today for follow up after initiation of Risankizumab for management of his Crohn's Disease at the request of Dr. Noel Gerold.    Interval History:    Patient shares he hasn't been doing great overall, still is experiencing abdominal pain across his lower abdomen and on his left side that he rates as anywhere from a 2-5 out of 10 on pain scale. Has had to take more of his Suboxone to help with the pain. He has been on Skyrizi for 16 weeks now and has not had much change in bowel movement frequency and consistency, and is still having nausea and pain. This visit was conducted in conjunction with Dr. Stevphen Rochester. Please see his note for more interval history.    Patient brought Skyrizi injection into clinic to assist with first administration. OBI was self-administered into upper right thigh.      Evaluation of IBD Biologic Therapy: risankizumab  Time on Current Therapy: started 11/01/22     - Last Dose: 12/31/22     - Adherence: no missed infusions     - Symptoms: a few episodes of incontenince   # of BM/day: 3-10   Stool Consistency: Most part loose, some semi formed   Stool Urgency: yes   Nocturnal BM: A few times not consistent    Blood in Stool: Yes from hemorroids   Abdominal Pain: yes   Nausea/Vomiting: Nausea present daily    Any Sickness: Viral cold over the holidays   NSAID use: none   Antidiarrheal use: 3-4x daily, can tell that it is working (could not tell in the past)      - Adverse Effects: none    Other IBD Therapies:  []  5-ASA  []  Immunomodulators  []  Corticosteroids    Disease Severity Index:  [name]  General Well Being: 3 = Very poor  Abdominal Pain:  2 = moderate  Number of Liquid Stools Per day: 4  Abdominal Mass:  0 = None  Number of Extraintestinal Manifestations:  0   1 point each for: 1. Arthritis/arthralgias, 2. Iritis/uveitis, 3. Active perianal dz, 4. Other active fistula, 5. Erythema nodosum or pyoderma, 6. Other  Total HBI Score (0-25):  5     Score:  Remission < 5;  Mild dz 5-7;  Moderate dz 8-16;  Severe > 16       MEDICATIONS/ALLERGIES:     Current Outpatient Medications   Medication Instructions    acetaminophen (TYLENOL) 1,000 mg, Every 4 hours PRN    albuterol HFA 90 mcg/actuation inhaler 2 puffs, Every 4 hours PRN    ALPRAZolam (XANAX) 0.25 mg, Daily PRN    buprenorphine-naloxone (SUBOXONE) 8-2 mg sublingual film TAKE ONE FILM DAILY    dicyclomine (BENTYL) 10 mg, Oral, 4 times a day (ACHS)    divalproex ER (DEPAKOTE ER) 500 MG extended released 24 hr tablet TAKE 2 TABLETS BY MOUTH WITH SUPPER DAILY    empty container Misc Use as directed    lamoTRIgine (LAMICTAL) 200 mg    levalbuterol (XOPENEX HFA) 45 mcg/actuation inhaler 1 puff, Every 4 hours PRN    loperamide (IMODIUM) 2 mg capsule TAKE 1-2 CAPSULES BY MOUTH EVERY 4 HOURS AS NEEDED FOR DIARRHEA    ondansetron (ZOFRAN-ODT) 4 mg, orally disintegrating tablet, Every 8 hours PRN    risankizumab-rzaa (SKYRIZI) 360 mg/2.4 mL (  150 mg/mL) Injt Inject the contents of 1 cartridge (360mg ) under the skin every 8 weeks       Allergies   Allergen Reactions    Acetaminophen Nausea And Vomiting    Benadryl [Diphenhydramine Hcl] Anaphylaxis    Sulfa (Sulfonamide Antibiotics) Hives    Rozerem [Ramelteon] Other (See Comments)     Pt reports hallucinations, muscle contortions  & dry eyes    Shellfish Containing Products Diarrhea and Nausea And Vomiting         IBD HISTORY:     Year of disease onset:  2007  Diagnosis:  Crohn's Disease  Age at onset:   53-40 yr old (A2)  Location:  Ileal (L1)  Behavior:  Colitis  Perianal Dz:  No    Brief IBD Disease Course:    Crohn's disease diagnosed as ileocolitis in 2007, treated initially with Pentasa and prednisone. Infliximab with partial response. Imuran with no help. Infusion reaction during infliximab retreatment. Adalimumab provided no benefit. Certolizumab with mild improvement but plateaued despite once weekly  dosing. In 12/2007, CT with ileitis, internal fistula and abscess. 02/04/08, ielocolic resection (40cm ti) by Dr. Timberlane Sever in [place]. 07/2008, diarrhea. 05/03/09, thickening of the neoterminal ileum on CT scan. 6-MP started 50 mg per day. 05/03/09, start adalimumab. 06/01/09, 6-MP increased to 75 mg. 07/19/09, desipramine to 100 mg per day without benefit. 11/10/2009, 6 inches of ileal resection (stricture) with primary anastomosis. 12/20/2009, reluctant to restart immunosuppression. 03/21/10, stopped desipramine due to lack of benefit and start Cymbalta. 05/25/10 Colonoscopy with i3 recurrence. Restart 6-MP 75mg , but declined adalimumab due to toe infections. 03/01/11, 6 inches of small bowel resected just proximal to the anastomosis. 05/22/11 start oral methotrexate. 06/04/11, CT abdomen and pelvis showed small-bowel dilation to 3.4 cm, prominent thickening of the ileocolonic anastomosis. Start ustekinumab. 10/26/11, colonoscopy showed minimal improvement. MTX switched to 25mg  [place].  02/20/13, colonoscopy showed I2 disease (possibly mildly improved).  Patient self stopped methotrexate due to perceived increase infectious complications.  Continued ustekinumab monotherapy.  2017, stopped ustekinumab due to incarceration.  2018, worsening symptoms, start adalimumab in prison with improvement.  08/2019, stopped adalimumab upon release from prison.  Worsening symptoms. 09/08/2020, start 6mp 75mg  daily. 12/06/2020, start adalimumab. 07/27/21, strep intermedius empyema of the left lung. and adalimumab stopped. 11/21/2021, resume 6mp 75mg . 12/06/2021, resume adalimumab. 07/29/2022, CTE with 20 cm active ileitis and proximal dilatation. 08/08/2022, change adalimumab/6-MP to upadacitinib 45 mg. 11/01/2022, persistent disease on CT,  change upadacitinib to risankizumab.     Endoscopy:      07/18/2021, colonoscopy to TI x1 cm. Fair prep. Nondraining sinus tract versus fistula on perianal exam. Edema, small superficial ulcers and mild stenosis (1 cm) at ileocolonic anastomosis. Dilated to 13.5 mm. Anastomosis was traversed, but deeper intubation of the ileum was prevented by looping.     Imaging:     10/22/2022, CTE.  4.5 cm enhancement and mild narrowing of neo-TI with proximal dilatation up to 3.1 cm.  Short segment 2.9 cm thickening and enhancement of the rectosigmoid.     Extraintestinal Manifestations:   []  Joint Pains:  [x]  Ocular:blurry vision  []  Skin:  []  Oral ulcers:  []  Thrombosis:  []  PSC:  []  Other:    Prior IBD Therapy:  []  5-ASAs  []  Oral corticosteroids  []  Intravenous corticosteroids  []  Antibiotics  []  Thiopurines   [x]  Methotrexate  [x]  Anti-TNF therapies - infliximab, adalimumab, certolizumab  []  Anti-Integrin therapies  [x]  Anti-Interleukin therapies - ustekinumab  [x]  Anti-JAK therapies -  upadacitinib  []  Cyclosporine  []  Clinical trial medication  []  Other (Please specify):    IBD Health Maintenance    Vaccine Date   Influenza due   Pneumococcal 09/08/20   COVID-19 due   Zoster 02/09/21, 12/28/21   Hepatitis B Due       []  Iron Deficiency  Lab Results   Component Value Date    FERRITIN 79.9 09/06/2022    LABIRON 61 (H) 09/06/2022    HGB 13.4 12/28/2022       []  Vitamin D Deficiency  No results found for: VITDTOTAL       RELEVANT LABS, DATA, INDICES:     Lab Results   Component Value Date    HBSAG Nonreactive 09/08/2020    QFTTBGOLD Positive (A) 09/08/2020       Lab Results   Component Value Date    WBC 7.2 12/28/2022    HGB 13.4 12/28/2022    HCT 39.3 12/28/2022    PLT 366 12/28/2022       Lab Results   Component Value Date    NA 140 12/28/2022    K 4.4 12/28/2022    CL 104 12/28/2022    CO2 24.0 12/28/2022    BUN 9 12/28/2022    CREATININE 0.80 12/28/2022    GLU 96 12/28/2022    CALCIUM 9.2 12/28/2022    MG 1.8 08/18/2012    PHOS 3.7 08/18/2012       Lab Results   Component Value Date    BILITOT 0.2 (L) 12/28/2022    BILIDIR 0.20 11/01/2022    PROT 6.4 12/28/2022    ALBUMIN 3.5 12/28/2022    ALT 21 12/28/2022    AST 21 12/28/2022    ALKPHOS 83 12/28/2022    GGT 33 04/09/2012       Lab Results   Component Value Date    CRP <4.0 10/22/2022    NULL 276 (H) 09/06/2022       Lab Results   Component Value Date    Adalimumab 13.3 07/05/2022    Adalimumab 10.2 02/09/2021         ASSESSMENT & RECOMMENDATIONS:     Crohn's Disease  Moderate-Severe, Uncontrolled. Patient has tolerated induction therapy and started maintenance therapy of Skyrizi on 01/24/23 however has not experiencing a positive clinical response as he is still experiencing 3-10 bowel movements a day, loose consistency, using multiple antidiarrheals, and has abdominal pain. Diarrhea is likely due to bile salt malabsoprtion and pain, food intolerance like due to ileal stricture   - Will refer patient to colorectal surgery for consideration of ileal resection  - Continue with Skyrizi maintenance therapy every 8 weeks for now.     -- Follow up labs with CBC w/ diff and CMP in 6 months.  - Continue loperamide as needed    --------------------------------------------    Wannetta Sender, PharmD Candidate  [facility]

## 2023-01-24 ENCOUNTER — Ambulatory Visit: Admit: 2023-01-24 | Discharge: 2023-01-24 | Payer: MEDICARE

## 2023-01-24 ENCOUNTER — Ambulatory Visit: Admit: 2023-01-24 | Discharge: 2023-01-24 | Payer: MEDICARE | Attending: Gastroenterology | Primary: Gastroenterology

## 2023-01-24 DIAGNOSIS — K50912 Crohn's disease, unspecified, with intestinal obstruction: Principal | ICD-10-CM

## 2023-01-24 DIAGNOSIS — F1120 Opioid dependence, uncomplicated: Principal | ICD-10-CM

## 2023-01-24 NOTE — Unmapped (Signed)
-  Continue Skyrizi injections every 8 weeks  -Colonoscopy to restage distribution of Crohn's disease. Please call [phone], option 1, then option 2 to schedule.  -I will have Dr. Tammy Sours office reach out to you to schedule a clinic visit to discuss surgical options.  -Completely stop smoking.  -Maintain nutrition as best possible.  Eat a high protein diet.  -If you do have surgery, I recommend continuing Skyrizi post op  -Clinic visit with me as scheduled in March

## 2023-01-24 NOTE — Unmapped (Signed)
REASON FOR VISIT:  Crohn's ileitis    HISTORY OF PRESENT ILLNESS:  Since last visit,  Worsening blq ap over last 2 days. LLQ ap worse before bm. RLQ worse after eating. Daily abdominal pain. Sharp. Lasts 39min-2hrs. Waves. Multiple times per day. Nausea 3-4x/week. No vomiting. 1st skyrizi injection today (4 months since starting skyrizi).  Has 0-10bms per day. 1 nocturnal bm per week. Loose while taking 8 loperamide per day. No nocturnal bms.  Has 6-7 lbs unintentional weight loss.    MEDICATIONS:  has a current medication list which includes the following prescription(s): acetaminophen, albuterol, alprazolam, buprenorphine-naloxone, dicyclomine, divalproex er, empty container, lamotrigine, levalbuterol, loperamide, ondansetron, and skyrizi.    ALLERGIES:  Allergies as of 01/24/2023 - Reviewed 01/24/2023   Allergen Reaction Noted    Acetaminophen Nausea And Vomiting 04/27/2012    Benadryl [diphenhydramine hcl] Anaphylaxis 04/27/2012    Sulfa (sulfonamide antibiotics) Hives 04/27/2012    Rozerem [ramelteon] Other (See Comments) 05/11/2014    Shellfish containing products Diarrhea and Nausea And Vomiting 06/04/2011       PAST MEDICAL HISTORY:  1. Crohn's disease diagnosed as ileocolitis in 2007, treated initially with Pentasa and prednisone. Infliximab with partial response. Imuran with no help. Infusion reaction during infliximab retreatment. Adalimumab provided no benefit. Certolizumab with mild improvement but plateaued despite once weekly  dosing. In 12/2007, CT with ileitis, internal fistula and abscess. 02/04/08, ielocolic resection (40cm ti) by Dr.  Sever in [place]. 07/2008, diarrhea. 05/03/09, thickening of the neoterminal ileum on CT scan. 6-MP started 50 mg per day. 05/03/09, start adalimumab. 06/01/09, 6-MP increased to 75 mg. 07/19/09, desipramine to 100 mg per day without benefit. 11/10/2009, 6 inches of ileal resection (stricture) with primary anastomosis. 12/20/2009, reluctant to restart immunosuppression. 03/21/10, stopped desipramine due to lack of benefit and start Cymbalta. 05/25/10 Colonoscopy with i3 recurrence. Restart 6-MP 75mg , but declined adalimumab due to toe infections. 03/01/11, 6 inches of small bowel resected just proximal to the anastomosis. 05/22/11 start oral methotrexate. 06/04/11, CT abdomen and pelvis showed small-bowel dilation to 3.4 cm, prominent thickening of the ileocolonic anastomosis. Start ustekinumab. 10/26/11, colonoscopy showed minimal improvement. MTX switched to 25mg  [place].  02/20/13, colonoscopy showed I2 disease (possibly mildly improved).  Patient self stopped methotrexate due to perceived increase infectious complications.  Continued ustekinumab monotherapy.  2017, stopped ustekinumab due to incarceration.  2018, worsening symptoms, start adalimumab in prison with improvement.  08/2019, stopped adalimumab upon release from prison.  Worsening symptoms. 09/08/2020, start 6mp 75mg  daily. 12/06/2020, start adalimumab. 07/27/21, strep intermedius empyema of the left lung. and adalimumab stopped. 11/21/2021, resume 6mp 75mg . 12/06/2021, resume adalimumab. 07/29/2022, CTE with 20 cm active ileitis and proximal dilatation. 08/08/2022, change adalimumab/6-MP to upadacitinib 45 mg. 11/01/2022, persistent disease on CT,  change upadacitinib to risankizumab.  2. 06/2014, pilonidal cyst excision.  05/01/2021, pilonidal cyst excision.  12/06/2021, resume adalimumab.  3. Bipolar disorder  4. Substance use disorder  5. 09/08/20, PCV-20  6.  09/2020, QuantiFERON gold positive. 10/28/2020, [facility] health department determined patient did not need treatment for LTBI based on 2 repeat negative tests.  7.  02/09/2021, Shingrix No. 1, #2 on 12/28/21    SOCIAL HISTORY:  Continues to smoke cigarettes (1 pack per week)    FAMILY HISTORY:  No colon cancer    PHYSICAL EXAM:  There were no vitals taken for this visit.  Wt Readings from Last 4 Encounters:   01/24/23 71.7 kg (158 lb)   12/31/22 74.8 kg (165 lb)   12/28/22 74.8  kg (165 lb)   11/29/22 73.7 kg (162 lb 6.4 oz)     CONSTITUTIONAL: awake, alert, NAD  Abdomen: Mild RLQ ttp    TEST DATA:  1. 05/03/09, CT scan with thickening of neoterminal ileum and subcm mesenteric lymphadenopathy. No stranding, abscess, or fistula.   2.08/26/2009, EGD was normal.   3.08/26/2009, colonoscopy to the neoterminal ileum. Poor bowel prep. Edema, erythema and shallow ulcerations at ICA and neoterminal ileum. Benign-appearing mild stenosis just proximal to ICA that was not traversed.  4. 10/28/2009, small bowel follow through showed 4-hour transit time, adherent small bowel loops in the pelvis that were difficult to separate, 1.5-2 cm segment of stricture in the neoterminal ileum that was 4 mm in diameter, more proximal 2.25 cm of neoterminal ileum was mildly narrowed.   5. 05/20/10, colonoscopy to neoterminal ileum with poor prep, i3 recurrence.   6. 02/07/2011, colonoscopy to 5 cm in the terminal ileum showed a linear ulcer at the ileocolonic anastomosis and severe ulceration in the neoterminal ileum that was worse than before. Terminal ileum could not be intubated more deeply due to edema and/or stricturing.   7. 06/04/11, CT abdomen and pelvis showed small-bowel dilatation to 3.4 cm, gas and stool in colon, prominent thickening at the ileocolonic anastomosis  8. 10/26/11 Colonoscopy- ICA with linear ulceration confined to the anastamotic line. >5 medium sized aphthous ulcers with normal intervening mucosa in the neoti. Slightly improved compared to prior pre-op colonoscopy.   9. 02/20/13, colonoscopy. Normal ICA. i2 disease in the distal 6 cm of TI.  Slightly improved.  10. 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  09/12/2020, CTAP.  Gallbladder wall thickening.  Marked thickening of the terminal ileum x20 cm.  2 cm mesenteric lymphadenopathy  12.  09/12/2020, right upper quadrant ultrasound.  Gallbladder sludge, wall thickening.  13.  09/14/2020, HIDA scan.  Normal.  14. 07/18/2021, colonoscopy to TI x1 cm.  Fair prep.  Nondraining sinus tract versus fistula on perianal exam.  Edema, small superficial ulcers and mild stenosis (1 cm) at ileocolonic anastomosis.  Dilated to 13.5 mm.  Anastomosis was traversed, but deeper intubation of the ileum was prevented by looping.   15. 07/05/2022, adalimumab 13.3, CRP 4mg /L  16. 07/29/2022, CTE.  20 cm segment of wall thickening, hyperenhancement.  Marked luminal narrowing of neo-TI at anastomosis with mild upstream bowel dilatation measuring 3.2 cm.  Questionable wall thickening and narrowing in the sigmoid colon.  1.1 cm MLN.  17. 09/06/2022, Fecal calprotectin 276  18. 10/22/2022, CTE.  4.5 cm enhancement and mild narrowing of neo-TI with proximal dilatation up to 3.1 cm.  Short segment 2.9 cm thickening and enhancement of the rectosigmoid.    ASSESSMENT:  1. Ileal Crohn's disease.  Stricturing phenotype.  Currently moderately symptomatic with diarrhea and postprandial abdominal pain.  Some recent weight loss which might be due to an unrelated viral upper respiratory infection.  Alternatively, the weight loss could reflect worsening food intolerance from his ileal stricture.  I suspect that the diarrhea is largely due to bile salt malabsorption.  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  Minimally improved since switching to risankizumab.  Therefore, I will refer him to colorectal surgeon for consideration of short segment ileal resection.  Would continue risankizumab postoperatively indefinitely.  Reviewed risks including risk of infection.  Since it has been 18 months since his last colonoscopy, recommend restaging colonoscopy now.  2.  Intermittent diarrhea.  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  Continue loperamide for now.    3. Tobacco use disorder.  Smoking cessation counseling.  4.  Chronic recurrent nausea.  Possibly due to pain medication in combination with ileal stricture.  Ondansetron as needed.  5.  Medically immune suppressed.  In need of vaccination.  Patient declined vaccines today.    RECOMMENDATIONS AND PLAN:  Patient Instructions   -Continue Skyrizi injections every 8 weeks  -Colonoscopy to restage distribution of Crohn's disease. Please call [phone], option 1, then option 2 to schedule.  -I will have Dr. Tammy Sours office reach out to you to schedule a clinic visit to discuss surgical options.  -Completely stop smoking.  -Maintain nutrition as best possible.  Eat a high protein diet.  -If you do have surgery, I recommend continuing Skyrizi post op  -Clinic visit with me as scheduled in March

## 2023-01-25 NOTE — Unmapped (Signed)
Colonoscopy  Procedure #1     Procedure #2   [id number]  MRN   HANSEN  Endoscopist   TRUE  Is the patient's health insurance [employer], [employer], [facility]), [employer] [place], [employer], or [place]?     Urgent procedure     Are you pregnant?     Are you in the process of scheduling or awaiting results of a heart ultrasound, stress test, or catheterization to evaluate new or worsening chest pain, dizziness, or shortness of breath?     Do you take: Plavix (clopidogrel), Coumadin (warfarin), Lovenox (enoxaparin), Pradaxa (dabigatran), Effient (prasugrel), Xarelto (rivaroxaban), Eliquis (apixaban), Pletal (cilostazol), or Brilinta (ticagrelor)?          Did ordering provider indicate how long to hold this medication in the order comments?          Which of the above medications are you taking?          What is the name of the medical practice that manages this medication?          What is the name of the medical provider who manages this medication?     Do you have hemophilia, von Willebrand disease, or low platelets?     Do you have a pacemaker or implanted cardiac defibrillator?     Has a [facility] provider specified the location(s)?     Which location(s) did the [facility] provider specify?          [facility]          [facility]          [facility]     Do you see a liver specialist for chronic liver disease?     Is the procedure indication for variceal screening?     Is procedure indication for variceal banding (this does NOT include variceal screening)?     Have you had a heart attack, stroke or heart stent placement within the past 6 months?     Month of event     Year of event (ONLY ENTER LAST 2 DIGITS)        5  Height (feet)   11  Height (inches)   156  Weight (pounds)   21.8  BMI        TRUE  Did the ordering provider specify a bowel prep?   EXTENDED PREP        What bowel prep was specified?     Do you have an ostomy (bag on your stomach that collects your stool)?          Is it an ileostomy? Is it a colostomy?          Patient doesn't know.     Do you have chronic kidney disease?     Do you have chronic constipation or have you had poor quality bowel preps for past colonoscopies?     Do you have Crohn's disease or ulcerative colitis?     Have you had weight loss surgery?          When you walk around your house or grocery store, do you have to stop and rest due to shortness of breath, chest pain, or light-headedness?     Do you ever use supplemental oxygen?     Have you been hospitalized for cirrhosis of the liver or heart failure in the last 12 months?     Have you been treated for mouth or throat cancer with radiation or surgery?  Have you been told that it is difficult for doctors to insert a breathing tube in you during anesthesia?     Have you had a heart or lung transplant?          Are you on dialysis?     Do you have cirrhosis of the liver?     Do you have myasthenia gravis?     Is the patient a prisoner?   ################# ## ###################################################################################################################   MRN:  [id number]   Anticoag Review  No   Nurse Triage  No   GI clinic consult  No   Procedure(s):  Colonoscopy     0   Endoscopist:  HANSEN   Urgent:  No   Prep:  EXTENDED PREP                   --------------------------- --- ----------------------------------------------------------------------------------------------------------------------------------------------------------------------------   [facility] Locations:  [facility]   [facility]  [facility]     [place]        Requested Locations:              ################# ## ###################################################################################################################

## 2023-01-29 ENCOUNTER — Ambulatory Visit: Admit: 2023-01-29 | Discharge: 2023-01-30 | Payer: MEDICARE

## 2023-01-29 DIAGNOSIS — K50019 Crohn's disease of small intestine with unspecified complications: Principal | ICD-10-CM

## 2023-01-29 DIAGNOSIS — K50919 Crohn's disease, unspecified, with unspecified complications: Principal | ICD-10-CM

## 2023-01-29 LAB — COMPREHENSIVE METABOLIC PANEL
ALBUMIN: 3.7 g/dL (ref 3.4–5.0)
ALKALINE PHOSPHATASE: 80 U/L (ref 46–116)
ALT (SGPT): 10 U/L (ref 10–49)
ANION GAP: 12 mmol/L (ref 5–14)
AST (SGOT): 17 U/L (ref <=34)
BILIRUBIN TOTAL: 0.3 mg/dL (ref 0.3–1.2)
BLOOD UREA NITROGEN: 13 mg/dL (ref 9–23)
BUN / CREAT RATIO: 17
CALCIUM: 9.7 mg/dL (ref 8.7–10.4)
CHLORIDE: 101 mmol/L (ref 98–107)
CO2: 26.4 mmol/L (ref 20.0–31.0)
CREATININE: 0.77 mg/dL (ref 0.73–1.18)
EGFR CKD-EPI (2021) MALE: >90 mL/min/{1.73_m2} (ref >=60)
GLUCOSE RANDOM: 101 mg/dL — ABNORMAL HIGH (ref 70–99)
POTASSIUM: 4.4 mmol/L (ref 3.4–4.8)
PROTEIN TOTAL: 7 g/dL (ref 5.7–8.2)
SODIUM: 139 mmol/L (ref 135–145)

## 2023-01-29 MED ORDER — CIPROFLOXACIN 500 MG TABLET
ORAL_TABLET | 0 refills | 0.00 days | Status: CP
Start: 2023-01-29 — End: ?

## 2023-01-29 MED ORDER — BISACODYL 5 MG TABLET,DELAYED RELEASE
ORAL_TABLET | ORAL | 0 refills | 0.00 days | Status: CP
Start: 2023-01-29 — End: 2023-01-29

## 2023-01-29 MED ORDER — POLYETHYLENE GLYCOL 3350 17 GRAM/DOSE ORAL POWDER
Freq: Once | ORAL | 0 refills | 0.00 days | Status: CP
Start: 2023-01-29 — End: 2023-01-29

## 2023-01-29 MED ORDER — METRONIDAZOLE 500 MG TABLET
ORAL_TABLET | 0 refills | 0.00 days | Status: CP
Start: 2023-01-29 — End: ?

## 2023-01-29 MED ORDER — PEG 3350-ELECTROLYTES 236 GRAM-22.74 GRAM-6.74 GRAM-5.86 GRAM SOLUTION
Freq: Once | ORAL | 0 refills | 1.00 days | Status: CP
Start: 2023-01-29 — End: 2023-01-29

## 2023-01-29 NOTE — Unmapped (Addendum)
Referring Provider:  Luanne Bras, MD  [address]  [id number]  [place],  [place] [address]    Return Patient Consultation Note    Date: 01/29/23    Chief Complaint: Crohn's disease    History of Present Illness:    Dakota Townsend is a 43 y.o. male who is known to me (I previously performed trephination for pilonidal cyst).  He has Crohn's disease and has undergone several previous ileocolic resections; he had an ostomy following one of the resections.  It has been 11 years since his last procedure.  He started [employer] 16 weeks ago. Immediately after his infusion, he gets constipated and then it returns to diarrhea.  He previous was on Rinvoq, Humira (longest), Remicade (reaction), Stelara.  He was on steroids for two months, but has been off steroids for at least two months.  He reports that his weight is down ~5-10 lbs and he has been having intermittent nausea 4-5 times per week.  He has also been getting intermittently bloated.      Past Medical History:   Diagnosis Date    Crohn disease (CMS-HCC)     Pneumonia        Past Surgical History:   Procedure Laterality Date    ADENOIDECTOMY Bilateral     COLON SURGERY      eustachian Bilateral     ostomy reversal      PR COLONOSCOPY FLX DX W/COLLJ SPEC WHEN PFRMD N/A 02/20/2013    Procedure: COLONOSCOPY, FLEXIBLE, PROXIMAL TO SPLENIC FLEXURE; DIAGNOSTIC, W/WO COLLECTION SPECIMEN BY BRUSH OR WASH;  Surgeon: Theadore Nan, MD;  Location: GI PROCEDURES [facility];  Service: Gastroenterology    PR COLONOSCOPY FLX DX W/COLLJ SPEC WHEN PFRMD N/A 07/18/2021    Procedure: COLONOSCOPY, FLEXIBLE, PROXIMAL TO SPLENIC FLEXURE; DIAGNOSTIC, W/WO COLLECTION SPECIMEN BY BRUSH OR WASH;  Surgeon: Luanne Bras, MD;  Location: [facility] PROCEDURES [facility];  Service: Gastroenterology    PR REMV PILONIDAL LESION SIMPLE N/A 05/01/2021    Procedure: EXCISION OF PILONIDAL CYST OR SINUS; SIMPLE;  Surgeon: Sharyn Lull, MD;  Location: MAIN OR [place]; Service: Gastrointestinal    PR SURG DIAGNOSTIC EXAM, ANORECTAL N/A 05/01/2021    Procedure: ANORECTAL EXAM, SURGICAL, REQUIRING ANESTHESIA (GENERAL, SPINAL, OR EPIDURAL), DIAGNOSTIC;  Surgeon: Sharyn Lull, MD;  Location: MAIN OR [place];  Service: Gastrointestinal    TONSILLECTOMY          Allergies   Allergen Reactions    Acetaminophen Nausea And Vomiting    Benadryl [Diphenhydramine Hcl] Anaphylaxis    Sulfa (Sulfonamide Antibiotics) Hives    Rozerem [Ramelteon] Other (See Comments)     Pt reports hallucinations, muscle contortions  & dry eyes    Shellfish Containing Products Diarrhea and Nausea And Vomiting       Current Outpatient Medications   Medication Sig Dispense Refill    acetaminophen (TYLENOL) 500 MG tablet Take 2 tablets (1,000 mg total) by mouth every four (4) hours as needed.      albuterol HFA 90 mcg/actuation inhaler Inhale 2 puffs every four (4) hours as needed.      ALPRAZolam (XANAX) 0.25 MG tablet Take 1 tablet (0.25 mg total) by mouth daily as needed.      bisacodyl (DULCOLAX) 5 mg EC tablet Take 2 tablets (10 mg total) by mouth Take as directed. Take 2 tablets (10 mg total) as directed for bowel prep. 2 tablet 0    buprenorphine-naloxone (SUBOXONE) 8-2 mg sublingual film TAKE  ONE FILM DAILY (Patient taking differently: Taking a half strip BID)      dicyclomine (BENTYL) 10 mg capsule Take 1 capsule (10 mg total) by mouth Four (4) times a day (before meals and nightly). 120 capsule 11    divalproex ER (DEPAKOTE ER) 500 MG extended released 24 hr tablet TAKE 2 TABLETS BY MOUTH WITH SUPPER DAILY      empty container Misc Use as directed 1 each 3    lamoTRIgine (LAMICTAL) 200 MG tablet Take 1 tablet (200 mg total) by mouth.      levalbuterol (XOPENEX HFA) 45 mcg/actuation inhaler Inhale 1 puff every four (4) hours as needed.      loperamide (IMODIUM) 2 mg capsule TAKE 1-2 CAPSULES BY MOUTH EVERY 4 HOURS AS NEEDED FOR DIARRHEA 120 capsule 5    ondansetron (ZOFRAN-ODT) 4 MG disintegrating tablet Dissolve 1 tablet (4 mg total) in the mouth every eight (8) hours as needed for nausea. 90 tablet 5    polyethylene glycol (CLEARLAX) 17 gram/dose powder Take as directed for extended bowel prep. 238 g 0    polyethylene glycol (GOLYTELY) 236-22.74-6.74 gram solution Take 4,000 mL by mouth once for 1 dose. Take as directed for split bowel prep. 4000 mL 0    risankizumab-rzaa (SKYRIZI) 360 mg/2.4 mL (150 mg/mL) Injt Inject the contents of 1 cartridge (360mg ) under the skin every 8 weeks 2.4 mL 2     No current facility-administered medications for this visit.       No family history on file.    Social History     Tobacco Use    Smoking status: Every Day     Current packs/day: 0.00     Average packs/day: 1 pack/day for 24.1 years (24.1 ttl pk-yrs)     Types: Cigarettes     Start date: 06/11/1997     Last attempt to quit: 07/08/2021     Years since quitting: 1.5    Smokeless tobacco: Never    Tobacco comments:     using nicotine patch and nicotrol inhaler.   Vaping Use    Vaping status: Never Used   Substance Use Topics    Alcohol use: Yes     Alcohol/week: 5.0 standard drinks of alcohol     Types: 5 Standard drinks or equivalent per week     Comment: occassional    Drug use: Not Currently       Review of Systems:   As per HPI    Physical Exam:  BP 119/82 (BP Position: Sitting)  - Pulse 78  - Temp 36.7 ??C (98 ??F)  - Ht 180.3 cm (5' 10.98)  - Wt 72.6 kg (160 lb)  - BMI 22.33 kg/m??    General: Comfortable and in no acute distress  HEENT: Anicteric, normocephalic  CV: Regular  Resp: Breathing comfortably on room air  Abd: Soft, nontender, nondistended  Extremities: Moving appropriately  Anal exam: Deferred      CT enterography (10/22/2022)  Impression   1.  Redemonstrated sequelae of prior ileocecectomy. Redemonstration of a short segment of stratified mural hyperenhancement involving the neoterminal ileum (2:90-96), which spans a length of approximately 4.5 cm and results in at least moderate luminal narrowing with mild dilatation of the upstream small bowel (measuring up to 3.1 cm in greatest axial diameter). Again, these findings are suggestive of active inflammatory Crohn's disease with an associated stricture. No associated sinus tract, fistula, or adjacent abscess.   2.  Similar short segment of mild circumferential  wall thickening and corresponding stratified mural hyperenhancement involving the rectosigmoid colon (2:121), spans a length of approximately 2.9 cm and could potentially reflect an area of active inflammation. No definite dilatation of the upstream bowel is identified to suggest an associated stricture at this location.   3.  Other chronic or incidental findings, as described within the body of the report.         Assessment/Plan:  Cobi Tafel is a 43 y.o. male patient with ileocolic Crohn's disease.  He has had multiple previous resections and he has had an ostomy previously (most recent operation was 11 years ago).  He is scheduled for a colonoscopy on February 05, 2023.  Based on his CT enterography and symptoms, I suspect he will need a redo ileocolic resection.  We discussed the details of the procedure including the risks.  The risks discussed include but are not limited to wound infection, intra-abdominal infection, bleeding, injury to intra-abdominal structures and anastomotic leak requiring reoperation and creation of a stoma.  More general risks of surgery include pneumonia, blood clots, urinary tract infection, heart attack, stroke and death.  He understands these risks and agrees to proceed with the procedure.  We will check his labs today.  We will also await the colonoscopy to make sure that he does not have an issue in the left colon that needs to be addressed.  We will plan for consent on the day of surgery.  Total time spent: 45 minutes (review of records, history taking, examination, counseling, coordination of care, documentation).    Orlin Hilding, MD  Colorectal Surgery

## 2023-01-31 NOTE — Unmapped (Signed)
[facility] house referral form completed

## 2023-02-05 ENCOUNTER — Encounter: Admit: 2023-02-05 | Discharge: 2023-02-05 | Payer: MEDICARE

## 2023-02-05 ENCOUNTER — Inpatient Hospital Stay: Admit: 2023-02-05 | Discharge: 2023-02-05 | Payer: MEDICARE

## 2023-02-05 DIAGNOSIS — K5000 Crohn's disease of small intestine without complications: Secondary | ICD-10-CM | POA: Diagnosis not present

## 2023-02-05 DIAGNOSIS — K50812 Crohn's disease of both small and large intestine with intestinal obstruction: Secondary | ICD-10-CM | POA: Diagnosis not present

## 2023-02-05 DIAGNOSIS — Z980 Intestinal bypass and anastomosis status: Secondary | ICD-10-CM | POA: Diagnosis not present

## 2023-02-05 MED ORDER — ONDANSETRON 4 MG DISINTEGRATING TABLET
ORAL_TABLET | Freq: Three times a day (TID) | 5 refills | 30.00 days | Status: CP | PRN
Start: 2023-02-05 — End: 2023-08-04

## 2023-02-05 MED ADMIN — lidocaine (PF) (XYLOCAINE-MPF) 20 mg/mL (2 %) injection: INTRAVENOUS | @ 18:00:00 | Stop: 2023-02-05

## 2023-02-05 MED ADMIN — Propofol (DIPRIVAN) injection: INTRAVENOUS | @ 18:00:00 | Stop: 2023-02-05

## 2023-02-05 MED ADMIN — phenylephrine 20 mg in sodium chloride 0.9% 250 mL (80 mcg/mL) infusion PMB: INTRAVENOUS | @ 18:00:00 | Stop: 2023-02-05

## 2023-02-05 MED ADMIN — ePHEDrine (PF) 25 mg/5 mL (5 mg/mL) in 0.9% sodium chloride syringe Syrg: INTRAVENOUS | @ 18:00:00 | Stop: 2023-02-05

## 2023-02-11 ENCOUNTER — Inpatient Hospital Stay: Admit: 2023-02-11 | Discharge: 2023-02-11 | Disposition: A | Discharge status: Home / Self Care | Payer: MEDICARE

## 2023-02-11 ENCOUNTER — Encounter
Admit: 2023-02-11 | Discharge: 2023-02-11 | Payer: MEDICARE | Attending: Student in an Organized Health Care Education/Training Program | Primary: Student in an Organized Health Care Education/Training Program

## 2023-02-11 DIAGNOSIS — K50119 Crohn's disease of large intestine with unspecified complications: Principal | ICD-10-CM

## 2023-02-11 MED ORDER — BISACODYL 5 MG TABLET,DELAYED RELEASE
ORAL_TABLET | Freq: Once | ORAL | 0 refills | 1.00 days | Status: CP
Start: 2023-02-11 — End: 2023-02-12

## 2023-02-11 MED ORDER — METRONIDAZOLE 500 MG TABLET
ORAL_TABLET | 0 refills | 0.00 days | Status: CP
Start: 2023-02-11 — End: ?

## 2023-02-11 MED ORDER — CIPROFLOXACIN 500 MG TABLET
ORAL_TABLET | 0 refills | 0.00 days | Status: CP
Start: 2023-02-11 — End: ?

## 2023-02-11 MED ORDER — POLYETHYLENE GLYCOL 3350 17 GRAM/DOSE ORAL POWDER
Freq: Once | ORAL | 0 refills | 1.00 days | Status: CP
Start: 2023-02-11 — End: 2023-02-11
  Filled 2023-02-11: qty 4, 1d supply, fill #0
  Filled 2023-02-11: qty 238, 1d supply, fill #0

## 2023-02-14 ENCOUNTER — Encounter: Admit: 2023-02-14 | Payer: MEDICARE

## 2023-02-14 ENCOUNTER — Inpatient Hospital Stay: Admit: 2023-02-14 | Discharge: 2023-02-23 | Disposition: A | Discharge status: Home / Self Care | Payer: MEDICARE

## 2023-02-14 DIAGNOSIS — Z792 Long term (current) use of antibiotics: Secondary | ICD-10-CM | POA: Diagnosis not present

## 2023-02-14 DIAGNOSIS — Z882 Allergy status to sulfonamides status: Secondary | ICD-10-CM | POA: Diagnosis not present

## 2023-02-14 DIAGNOSIS — K50919 Crohn's disease, unspecified, with unspecified complications: Secondary | ICD-10-CM | POA: Diagnosis not present

## 2023-02-14 DIAGNOSIS — Z933 Colostomy status: Secondary | ICD-10-CM | POA: Diagnosis not present

## 2023-02-14 DIAGNOSIS — K5090 Crohn's disease, unspecified, without complications: Secondary | ICD-10-CM | POA: Diagnosis not present

## 2023-02-14 DIAGNOSIS — Z91013 Allergy to seafood: Secondary | ICD-10-CM | POA: Diagnosis not present

## 2023-02-14 DIAGNOSIS — G8929 Other chronic pain: Secondary | ICD-10-CM | POA: Diagnosis not present

## 2023-02-14 DIAGNOSIS — K50119 Crohn's disease of large intestine with unspecified complications: Secondary | ICD-10-CM | POA: Diagnosis not present

## 2023-02-14 DIAGNOSIS — D62 Acute posthemorrhagic anemia: Secondary | ICD-10-CM | POA: Diagnosis not present

## 2023-02-14 DIAGNOSIS — K50918 Crohn's disease, unspecified, with other complication: Secondary | ICD-10-CM | POA: Diagnosis not present

## 2023-02-14 DIAGNOSIS — Z9049 Acquired absence of other specified parts of digestive tract: Secondary | ICD-10-CM | POA: Diagnosis not present

## 2023-02-14 DIAGNOSIS — G8918 Other acute postprocedural pain: Secondary | ICD-10-CM | POA: Diagnosis not present

## 2023-02-14 DIAGNOSIS — Z6822 Body mass index (BMI) 22.0-22.9, adult: Secondary | ICD-10-CM | POA: Diagnosis not present

## 2023-02-14 DIAGNOSIS — Z48815 Encounter for surgical aftercare following surgery on the digestive system: Secondary | ICD-10-CM | POA: Diagnosis not present

## 2023-02-14 DIAGNOSIS — R1084 Generalized abdominal pain: Secondary | ICD-10-CM | POA: Diagnosis not present

## 2023-02-14 DIAGNOSIS — F17210 Nicotine dependence, cigarettes, uncomplicated: Secondary | ICD-10-CM | POA: Diagnosis not present

## 2023-02-14 DIAGNOSIS — K660 Peritoneal adhesions (postprocedural) (postinfection): Secondary | ICD-10-CM | POA: Diagnosis not present

## 2023-02-14 DIAGNOSIS — Z79891 Long term (current) use of opiate analgesic: Secondary | ICD-10-CM | POA: Diagnosis not present

## 2023-02-14 DIAGNOSIS — Z5971 Insufficient health insurance coverage: Secondary | ICD-10-CM | POA: Diagnosis not present

## 2023-02-14 DIAGNOSIS — R109 Unspecified abdominal pain: Secondary | ICD-10-CM | POA: Diagnosis not present

## 2023-02-14 DIAGNOSIS — E43 Unspecified severe protein-calorie malnutrition: Secondary | ICD-10-CM | POA: Diagnosis not present

## 2023-02-14 LAB — CBC
HEMATOCRIT: 36.7 % — ABNORMAL LOW (ref 39.0–48.0)
HEMOGLOBIN: 11.9 g/dL — ABNORMAL LOW (ref 12.9–16.5)
MEAN CORPUSCULAR HEMOGLOBIN CONC: 32.6 g/dL (ref 32.0–36.0)
MEAN CORPUSCULAR HEMOGLOBIN: 30.1 pg (ref 25.9–32.4)
MEAN CORPUSCULAR VOLUME: 92.6 fL (ref 77.6–95.7)
MEAN PLATELET VOLUME: 10.1 fL (ref 6.8–10.7)
PLATELET COUNT: 255 10*9/L (ref 150–450)
RED BLOOD CELL COUNT: 3.96 10*12/L — ABNORMAL LOW (ref 4.26–5.60)
RED CELL DISTRIBUTION WIDTH: 13.4 % (ref 12.2–15.2)
WBC ADJUSTED: 24.3 10*9/L — ABNORMAL HIGH (ref 3.6–11.2)

## 2023-02-14 MED ADMIN — bupivacaine (PF) (MARCAINE) 0.25 % (2.5 mg/mL) injection (PF): EPIDURAL | @ 17:00:00 | Stop: 2023-02-14

## 2023-02-14 MED ADMIN — HYDROmorphone (PF) (DILAUDID) injection Syrg 0.5 mg: .5 mg | INTRAVENOUS | @ 23:00:00 | Stop: 2023-02-14

## 2023-02-14 MED ADMIN — lactated Ringers infusion: INTRAVENOUS | @ 17:00:00 | Stop: 2023-02-14

## 2023-02-14 MED ADMIN — phenylephrine 1 mg/10 mL (100 mcg/mL) injection Syrg: INTRAVENOUS | @ 17:00:00 | Stop: 2023-02-14

## 2023-02-14 MED ADMIN — Propofol (DIPRIVAN) injection: INTRAVENOUS | @ 21:00:00 | Stop: 2023-02-14

## 2023-02-14 MED ADMIN — metroNIDAZOLE (FLAGYL) IVPB 500 mg: 500 mg | INTRAVENOUS | @ 18:00:00 | Stop: 2023-02-14

## 2023-02-14 MED ADMIN — heparin (porcine) 5,000 unit/mL injection 5,000 Units: 5000 [IU] | SUBCUTANEOUS | @ 16:00:00 | Stop: 2023-02-14

## 2023-02-14 MED ADMIN — alvimopan (ENTEREG) capsule 12 mg: 12 mg | ORAL | @ 16:00:00 | Stop: 2023-02-14

## 2023-02-14 MED ADMIN — cefTRIAXone (ROCEPHIN) 2 g in sodium chloride 0.9 % (NS) 100 mL IVPB-MBP: 2 g | INTRAVENOUS | @ 18:00:00 | Stop: 2023-02-14

## 2023-02-14 MED ADMIN — HYDROmorphone (PF) (DILAUDID) injection: INTRAVENOUS | @ 21:00:00 | Stop: 2023-02-14

## 2023-02-14 MED ADMIN — Propofol (DIPRIVAN) injection: INTRAVENOUS | @ 19:00:00 | Stop: 2023-02-14

## 2023-02-14 MED ADMIN — ondansetron (ZOFRAN) injection: INTRAVENOUS | @ 21:00:00 | Stop: 2023-02-14

## 2023-02-14 MED ADMIN — ROCuronium (ZEMURON) injection: INTRAVENOUS | @ 20:00:00 | Stop: 2023-02-14

## 2023-02-14 MED ADMIN — ROCuronium (ZEMURON) injection: INTRAVENOUS | @ 17:00:00 | Stop: 2023-02-14

## 2023-02-14 MED ADMIN — celecoxib (CeleBREX) capsule 200 mg: 200 mg | ORAL | @ 16:00:00 | Stop: 2023-02-14

## 2023-02-14 MED ADMIN — fentaNYL (PF) (SUBLIMAZE) injection: INTRAVENOUS | @ 22:00:00 | Stop: 2023-02-14

## 2023-02-14 MED ADMIN — ROCuronium (ZEMURON) injection: INTRAVENOUS | @ 18:00:00 | Stop: 2023-02-14

## 2023-02-14 MED ADMIN — ROCuronium (ZEMURON) injection: INTRAVENOUS | @ 19:00:00 | Stop: 2023-02-14

## 2023-02-14 MED ADMIN — midazolam (VERSED) injection: INTRAVENOUS | @ 17:00:00 | Stop: 2023-02-14

## 2023-02-14 MED ADMIN — ketamine (KETALAR) injection: INTRAVENOUS | @ 20:00:00 | Stop: 2023-02-14

## 2023-02-14 MED ADMIN — ketamine (KETALAR) injection: INTRAVENOUS | @ 19:00:00 | Stop: 2023-02-14

## 2023-02-14 MED ADMIN — Propofol (DIPRIVAN) injection: INTRAVENOUS | @ 22:00:00 | Stop: 2023-02-14

## 2023-02-14 MED ADMIN — fentaNYL (PF) (SUBLIMAZE) injection 25 mcg: 25 ug | INTRAVENOUS | @ 23:00:00 | Stop: 2023-02-14

## 2023-02-14 MED ADMIN — haloperidol LACTATE (HALDOL) injection: INTRAVENOUS | @ 22:00:00 | Stop: 2023-02-14

## 2023-02-14 MED ADMIN — HYDROmorphone (PF) (DILAUDID) injection: INTRAVENOUS | @ 20:00:00 | Stop: 2023-02-14

## 2023-02-14 MED ADMIN — indocyanine green (IC-GREEN) injection: INTRAVENOUS | @ 20:00:00 | Stop: 2023-02-14

## 2023-02-14 MED ADMIN — sterile water irrigation solution: @ 18:00:00 | Stop: 2023-02-14

## 2023-02-14 MED ADMIN — dexAMETHasone (DECADRON) 4 mg/mL injection: INTRAVENOUS | @ 17:00:00 | Stop: 2023-02-14

## 2023-02-14 MED ADMIN — ROCuronium (ZEMURON) injection: INTRAVENOUS | @ 21:00:00 | Stop: 2023-02-14

## 2023-02-14 MED ADMIN — sugammadex (BRIDION) injection: INTRAVENOUS | @ 22:00:00 | Stop: 2023-02-14

## 2023-02-14 MED ADMIN — ketamine (KETALAR) injection: INTRAVENOUS | @ 21:00:00 | Stop: 2023-02-14

## 2023-02-14 MED ADMIN — Propofol (DIPRIVAN) injection: INTRAVENOUS | @ 17:00:00 | Stop: 2023-02-14

## 2023-02-14 MED ADMIN — Propofol (DIPRIVAN) injection: INTRAVENOUS | @ 18:00:00 | Stop: 2023-02-14

## 2023-02-14 MED ADMIN — fentaNYL (PF) (SUBLIMAZE) injection: INTRAVENOUS | @ 17:00:00 | Stop: 2023-02-14

## 2023-02-14 MED ADMIN — HYDROmorphone (PF) (DILAUDID) injection: INTRAVENOUS | @ 22:00:00 | Stop: 2023-02-14

## 2023-02-14 MED ADMIN — ketamine (KETALAR) injection: INTRAVENOUS | @ 18:00:00 | Stop: 2023-02-14

## 2023-02-14 MED ADMIN — ROCuronium (ZEMURON) injection: INTRAVENOUS | @ 22:00:00 | Stop: 2023-02-14

## 2023-02-14 MED ADMIN — oxyCODONE (ROXICODONE) immediate release tablet 5 mg: 5 mg | ORAL | Stop: 2023-02-14

## 2023-02-14 MED ADMIN — bupivacaine LIPOSOMAL (PF) (EXPAREL) 266 mg, sodium chloride (NS) 0.9 % 1 mL infiltration injection: 266 mg | @ 17:00:00 | Stop: 2023-02-14

## 2023-02-14 MED ADMIN — lidocaine (PF) (XYLOCAINE-MPF) 20 mg/mL (2 %) injection: INTRAVENOUS | @ 17:00:00 | Stop: 2023-02-14

## 2023-02-14 MED ADMIN — pregabalin (LYRICA) capsule 100 mg: 100 mg | ORAL | @ 16:00:00 | Stop: 2023-02-14

## 2023-02-14 MED ADMIN — EXPAREL ADMINISTERED WITHIN 96 HRS - NO BUPIVACAINE FOR 96 HOURS AFTER EXPAREL: 1 | @ 17:00:00 | Stop: 2023-02-18

## 2023-02-14 NOTE — Unmapped (Signed)
Operative Note  (CSN: [id number])      Date of Surgery: 02/14/2023    Pre-op Diagnosis: Crohn's disease of colon with complication (CMS-HCC) [K50.119]    Post-op Diagnosis: same       [Note: Revisions to procedures should be made in chart - see Procedures tab.]    LAPAROSCOPY, SURGICAL; COLECTOMY, PARTIAL, WITH REMOVAL OF TERMINAL ILEUM WITH ILEOCOLOSTOMY  LAPAROSCOPY, SURGICAL, ENTEROLYSIS (FREEING OF INTESTINAL ADHESION) (SEPARATE PROCEDURE)    Performing Service: Gastrointestinal  Surgeons and Role:     * Sharyn Lull, MD - Primary     * Emilio Aspen, MD - Resident - Assisting    Assistant: None    Anesthesia: General    Estimated Blood Loss: 600 mL    Complications: None    Specimens:   ID Type Source Tests Collected by Time Destination   1 : redo ileocolic resection Tissue Abdomen SURGICAL PATHOLOGY Gus Height, MD 02/14/2023 1517        Findings: Severe stricturing at the previous ileocolic anastomosis, disease for ~30 cm of small bowel proximal to the anastomosis with deep ulcerations     Procedure:   Laparoscopic enterolysis x 120 minutes  Laparoscopic redo-ileocolic resection    Operative details:  The patient was taken to the operating room and general anesthesia was induced.  A Foley catheter was placed.  Intravenous antibiotics were administered.  The patient was positioned supine with arms tucked and all pressure points padded.  The abdomen was prepped and draped in the usual sterile fashion.  A timeout procedure was performed.     Using Veress technique the abdomen was insufflated in the left upper quadrant.  A 5 mm port was placed under direct vision in the left upper quadrant.  A camera was introduced.  The abdomen was inspected and no intra-abdominal injuries were noted.  A left lower quadrant 5 mm port was placed.  There were adhesions between the omentum and the anterior abdominal wall in multiple locations which were taken down using a combination of blunt and sharp dissection.  Additional 5 mm ports were placed in the suprapubic and infraumbilical positions.  The previous ileocolonic anastomosis and neo-terminal ileum were adherent to the right anterior wall and pelvic side wall.  This was freed using a combination of blunt and sharp dissection.  There were also some adhesions that were between the loops of small bowel.  These were freed using a combination of blunt and sharp dissection.  A total of 120 minutes was spent performing adhesiolysis.  Once bowel was freed, we were ready for exteriorization.  Total time performing enterolysis was 120 minutes.     The infraumbilical port site incision was extended for a distance of approximately 7-8 cm.  The bowel could not be exteriorized through a smaller incision because the abdominal wall was quite scarred from his previous operations.  A wound protector was placed and the transverse colon and small bowel were exteriorized.  The ileocolic anastomosis was inspected and found to be thickened and diseased for segment of approximately 5 to 10 cm.  The bowel proximal to this area also seemed to be disease.  An enterotomy was created the previous anastomosis and extended somewhat into the more proximal bowel.  There is was obviously diseased when it was opened; while there was no stricture, there was severe ulcerations.  The enterotomy was extended slowly until the mucosa appeared healthy and there was no bowel wall thickening and ulcerations.  This spanned  a distance of approximately 30 cm in total given the ileum was divided where the bowel was healthy using an 80 purple GIA stapler.  The proximal colon was divided just distal to the anastomosis using an 80 purple GIA stapler.  The intervening mesentery was divided and ligated using 2-0 Vicryl interrupted sutures.  The specimen was sent to pathology.  An enterotomy and colotomy were made at the staple lines.  Using an 80 purple GIA stapler a side-to-side functional end-to-end anastomosis was created.  The common enterocolotomy was inspected and there was some bleeding at the level of the staple line.  This was sutured which seemed to control the bleeding.  The enterotomy was closed in 2 layers and interrupted 3-0 Vicryl sutures.  The crotch of the GIA staple line was reinforced using 3-0 Vicryl interrupted sutures.  Hemostasis was confirmed.  The bowel was returned to the abdomen.  The bowel was returned to the abdomen and the anastomosis was covered with omentum.    The ports were removed.  The fascia of the midline incision was closed using 0 PDS in a running fashion.  The subcutaneous tissues were irrigated.  The skin incisions were closed using 4-0 Monocryl and glue.  The patient was then extubated and taken to recovery in good condition.  All counts were correct at the completion of the procedure.    Surgeon Notes: I was present and scrubbed for the entire procedure    Orlin Hilding, MD   Date: 02/14/2023  Time: 5:39 PM

## 2023-02-14 NOTE — Unmapped (Signed)
Brief Operative Note  (CSN: [id number])      Date of Surgery: 02/14/2023    Pre-op Diagnosis: Crohn's disease of colon with complication (CMS-HCC) [K50.119]    Post-op Diagnosis: Crohn's disease of colon with complication (CMS-HCC) [K50.119]    Procedure(s):  LAPAROSCOPY, SURGICAL; COLECTOMY, PARTIAL, WITH REMOVAL OF TERMINAL ILEUM WITH ILEOCOLOSTOMY: 98119 (CPT??)  Note: Revisions to procedures should be made in chart - see Procedures activity.    Performing Service: Gastrointestinal  Surgeons and Role:     * Sharyn Lull, MD - Primary     Emilio Aspen, MD - Resident - Assisting    Assistant: None    Findings: Moderate omental adhesions to prior midline scar, ileocolic anastomosis thickened and stenotic, distal ileum with significant intraluminal cobblestoning and ulceration, 30 cm ileum resected with prior anastomosis, ileocolic side-to-side anastomosis, 250 cm small bowel remaining     Anesthesia: General    Estimated Blood Loss: 600 mL    Complications: None    Specimens:   ID Type Source Tests Collected by Time Destination   1 : redo ileocolic resection Tissue Abdomen SURGICAL PATHOLOGY EXAM Sharyn Lull, MD 02/14/2023 1517        Implants: * No implants in log *        Emilio Aspen, MD   Date: 02/14/2023  Time: 5:16 PM

## 2023-02-14 NOTE — Unmapped (Signed)
Location of Patient Belongings on Day of Surgery          [x]  Patient Alone: Belongings in          Floor #: 3   Locker #: [id number]  *Code: 6807     Items Stored in Locker: Clothes, Jacket/Coat, and Footwear

## 2023-02-14 NOTE — Unmapped (Addendum)
 Dakota Townsend is a 43 y.o. male with a medical history significant for Crohn's disease who underwent a laparoscopic enterolysis x 120 minutes, laparoscopic redo-ileocolic resection on 02/14/2023. Intraoperatively, there was severe stricturing at the previous ileocolic anastomosis with evidence of Crohn's disease for ~30 cm of small bowel proximal to the anastomosis with deep ulcerations. He tolerated the procedure well, was extubated in the OR, and was transferred to the recovery room in stable condition, where he received routine postoperative care before being transferred to the floor.    The Acute/Chronic Pain Service was engaged early for assistance with pain management, given his Suboxone use. He was started on a ketamine infusion, along with Dilaudid PCA and breakthrough IV and oral opioids. Suboxone was also continued. Recommendations regarding pain medication were greatly appreciated and adjusted per the Pain Service's recommendations. The patient was able to void spontaneously and adequately after his urinary catheter was removed. His diet was slowly advanced and at the time of discharge, he was tolerating a regular diet. Pain medications were slowly transitioned to oral with the assistance of the Pain Service. Dakota Townsend will discharge with 3 days worth of oxycodone 15 mg Q4H PRN and Valium 5mg  PO PRN Q6H and has an appointment scheduled with his home psychiatrist who prescribes Suboxone on 02/25/23. Patient was prescribed Narcan at discharge. The patient will be discharged on prophylactic Lovenox due to having inflammatory bowel disease and the associated higher risk of VTE. The patient was taught how to administer the injections by nursing.     On day of discharge, the patient was seen and examined by the Colorectal Surgery team. The discharge plan and instructions were reviewed and all questions were answered. The patient is being discharged on 02/23/23 with appropriate outpatient clinic follow up scheduled.

## 2023-02-15 LAB — CBC
HEMATOCRIT: 28.2 % — ABNORMAL LOW (ref 39.0–48.0)
HEMOGLOBIN: 9.4 g/dL — ABNORMAL LOW (ref 12.9–16.5)
MEAN CORPUSCULAR HEMOGLOBIN CONC: 33.3 g/dL (ref 32.0–36.0)
MEAN CORPUSCULAR HEMOGLOBIN: 30.5 pg (ref 25.9–32.4)
MEAN CORPUSCULAR VOLUME: 91.5 fL (ref 77.6–95.7)
MEAN PLATELET VOLUME: 9.6 fL (ref 6.8–10.7)
PLATELET COUNT: 219 10*9/L (ref 150–450)
RED BLOOD CELL COUNT: 3.08 10*12/L — ABNORMAL LOW (ref 4.26–5.60)
RED CELL DISTRIBUTION WIDTH: 13.4 % (ref 12.2–15.2)
WBC ADJUSTED: 10.6 10*9/L (ref 3.6–11.2)

## 2023-02-15 LAB — HEPATIC FUNCTION PANEL
ALBUMIN: 2.7 g/dL — ABNORMAL LOW (ref 3.4–5.0)
ALKALINE PHOSPHATASE: 66 U/L (ref 46–116)
ALT (SGPT): 15 U/L (ref 10–49)
AST (SGOT): 15 U/L (ref <=34)
BILIRUBIN DIRECT: 0.2 mg/dL (ref 0.00–0.30)
BILIRUBIN TOTAL: 0.4 mg/dL (ref 0.3–1.2)
PROTEIN TOTAL: 5.1 g/dL — ABNORMAL LOW (ref 5.7–8.2)

## 2023-02-15 LAB — BASIC METABOLIC PANEL
ANION GAP: 8 mmol/L (ref 5–14)
BLOOD UREA NITROGEN: <5 mg/dL — ABNORMAL LOW (ref 9–23)
CALCIUM: 8.2 mg/dL — ABNORMAL LOW (ref 8.7–10.4)
CHLORIDE: 104 mmol/L (ref 98–107)
CO2: 27 mmol/L (ref 20.0–31.0)
CREATININE: 0.69 mg/dL — ABNORMAL LOW (ref 0.73–1.18)
EGFR CKD-EPI (2021) MALE: >90 mL/min/{1.73_m2} (ref >=60)
GLUCOSE RANDOM: 112 mg/dL (ref 70–179)
POTASSIUM: 3.9 mmol/L (ref 3.4–4.8)
SODIUM: 139 mmol/L (ref 135–145)

## 2023-02-15 LAB — MAGNESIUM: MAGNESIUM: 1.4 mg/dL — ABNORMAL LOW (ref 1.6–2.6)

## 2023-02-15 LAB — PHOSPHORUS: PHOSPHORUS: 4 mg/dL (ref 2.4–5.1)

## 2023-02-15 MED ADMIN — HYDROmorphone (DILAUDID) 50mg/50ml (1mg/ml) PCA CADD: INTRAVENOUS | @ 15:00:00 | Stop: 2023-03-01

## 2023-02-15 MED ADMIN — HYDROmorphone (DILAUDID) injection 2 mg: 2 mg | INTRAVENOUS | @ 15:00:00 | Stop: 2023-02-28

## 2023-02-15 MED ADMIN — oxyCODONE (ROXICODONE) immediate release tablet 10 mg: 10 mg | ORAL | @ 14:00:00 | Stop: 2023-02-25

## 2023-02-15 MED ADMIN — nicotine (NICODERM CQ) 21 mg/24 hr patch 1 patch: 1 | TRANSDERMAL | @ 19:00:00

## 2023-02-15 MED ADMIN — calcium carbonate (TUMS) chewable tablet 400 mg elem calcium: 400 mg | ORAL | @ 19:00:00

## 2023-02-15 MED ADMIN — ondansetron (ZOFRAN) injection 4 mg: 4 mg | INTRAVENOUS | @ 04:00:00

## 2023-02-15 MED ADMIN — lactated ringers bolus 1,000 mL: 1000 mL | INTRAVENOUS | @ 16:00:00 | Stop: 2023-02-15

## 2023-02-15 MED ADMIN — enoxaparin (LOVENOX) syringe 40 mg: 40 mg | SUBCUTANEOUS | @ 16:00:00

## 2023-02-15 MED ADMIN — lamoTRIgine (LaMICtal) tablet 200 mg: 200 mg | ORAL | @ 16:00:00

## 2023-02-15 MED ADMIN — lactated Ringers infusion: 125 mL/h | INTRAVENOUS | @ 01:00:00

## 2023-02-15 MED ADMIN — acetaminophen (TYLENOL) tablet 1,000 mg: 1000 mg | ORAL | @ 16:00:00

## 2023-02-15 MED ADMIN — oxyCODONE (ROXICODONE) immediate release tablet 10 mg: 10 mg | ORAL | @ 10:00:00 | Stop: 2023-02-15

## 2023-02-15 MED ADMIN — HYDROmorphone (DILAUDID) tablet 4 mg: 4 mg | ORAL | @ 01:00:00 | Stop: 2023-02-14

## 2023-02-15 MED ADMIN — acetaminophen (TYLENOL) tablet 1,000 mg: 1000 mg | ORAL | @ 10:00:00

## 2023-02-15 MED ADMIN — HYDROmorphone (DILAUDID) injection 2 mg: 2 mg | INTRAVENOUS | @ 11:00:00 | Stop: 2023-02-28

## 2023-02-15 MED ADMIN — acetaminophen (OFIRMEV) 10 mg/mL injection 1,000 mg: 1000 mg | INTRAVENOUS | @ 01:00:00 | Stop: 2023-02-14

## 2023-02-15 MED ADMIN — ketamine 500 mg in sodium chloride 0.9 % 250 mL (2 mg/mL): .1 mg/kg/h | INTRAVENOUS | @ 18:00:00 | Stop: 2023-02-22

## 2023-02-15 MED ADMIN — ketamine (KETALAR) injection: INTRAVENOUS | @ 01:00:00 | Stop: 2023-02-14

## 2023-02-15 MED ADMIN — HYDROmorphone (PF) (DILAUDID) injection 1 mg: 1 mg | INTRAVENOUS | @ 09:00:00 | Stop: 2023-02-15

## 2023-02-15 MED ADMIN — dexmedeTOMIDine (Precedex) injection: INTRAVENOUS | @ 01:00:00 | Stop: 2023-02-14

## 2023-02-15 MED ADMIN — buprenorphine-naloxone (SUBOXONE) 2-0.5 mg SL film 2 mg of buprenorphine: 1 | SUBLINGUAL | @ 16:00:00

## 2023-02-15 MED ADMIN — HYDROmorphone (DILAUDID) injection 2 mg: 2 mg | INTRAVENOUS | @ 08:00:00 | Stop: 2023-02-28

## 2023-02-15 MED ADMIN — diazePAM (VALIUM) tablet 5 mg: 5 mg | ORAL | @ 03:00:00

## 2023-02-15 MED ADMIN — HYDROmorphone (DILAUDID) injection 2 mg: 2 mg | INTRAVENOUS | @ 23:00:00 | Stop: 2023-02-28

## 2023-02-15 MED ADMIN — sodium chloride (NS) 0.9 % infusion: 10 mL/h | INTRAVENOUS | @ 16:00:00

## 2023-02-15 MED ADMIN — acetaminophen (TYLENOL) tablet 1,000 mg: 1000 mg | ORAL | @ 23:00:00

## 2023-02-15 MED ADMIN — HYDROmorphone (DILAUDID) injection 2 mg: 2 mg | INTRAVENOUS | @ 19:00:00 | Stop: 2023-02-28

## 2023-02-15 MED ADMIN — diazePAM (VALIUM) tablet 5 mg: 5 mg | ORAL | @ 08:00:00

## 2023-02-15 MED ADMIN — HYDROmorphone (DILAUDID) injection 2 mg: 2 mg | INTRAVENOUS | @ 05:00:00 | Stop: 2023-02-28

## 2023-02-15 MED ADMIN — HYDROmorphone (PF) (DILAUDID) injection 1 mg: 1 mg | INTRAVENOUS | @ 02:00:00 | Stop: 2023-02-14

## 2023-02-15 MED ADMIN — diazePAM (VALIUM) tablet 5 mg: 5 mg | ORAL | @ 15:00:00

## 2023-02-15 MED ADMIN — divalproex ER (DEPAKOTE ER) extended released 24 hr tablet 1,000 mg: 1000 mg | ORAL | @ 04:00:00

## 2023-02-15 MED ADMIN — oxyCODONE (ROXICODONE) immediate release tablet 10 mg: 10 mg | ORAL | @ 04:00:00 | Stop: 2023-02-28

## 2023-02-15 NOTE — Unmapped (Addendum)
Social Work  Psychosocial Assessment    Patient Name: Dakota Townsend   Medical Record Number: [id number]   Date of Birth: May 09, 1980  Sex: Male     Referral  Referred by: Care Manager  Reason for Referral: Other - specify in comments (suboxone/ pt with anxiety)  Comment: suboxone ,   pt with anxiety    Extended Emergency Contact Information  Primary Emergency Contact: Alvera Singh [place]  Home Phone: [phone]  Relation: Mother  Secondary Emergency Contact: Leach,Rick  Mobile Phone: [phone]  Relation: Step Parent    Per H & P  Dakota Townsend is a 43 y.o. male who is known to me (I previously performed trephination for pilonidal cyst).  He has Crohn's disease and has undergone several previous ileocolic resections; he had an ostomy following one of the resections.  It has been 11 years since his last procedure.  He started [employer] 16 weeks ago. Immediately after his infusion, he gets constipated and then it returns to diarrhea.  He previous was on Rinvoq, Humira (longest), Remicade (reaction), Stelara.  He was on steroids for two months, but has been off steroids for at least two months.  He reports that his weight is down ~5-10 lbs and he has been having intermittent nausea 4-5 times per week.  He has also been getting intermittently bloated.     SW Assessment  SW met with pt along bedside.  Pt was alert and oriented x4.  SW introduced herself and explained SW role in discharge planning.    Pt verified current address and contact number. Pt resides at home with his mother and step father. He shared that the home has a ramp for access. [facility] [employer] [facility] in April of 2021. Pt currently still on probation and ordered to pay restitution until April 08, 2023.      Pt received HH services approximately 11 years ago but he could not remember the agency. Pt denied having any equipment at home.  Pt receives $1500 a month in SSI and no food stamps (as he did not re certify in December, 2024). 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     SW reviewed SDOH's with no concerns noted.  Pt receives suboxone for pain management one year and prescribed from Dr. Kerrie Pleasure from [facility].   Pt receives regular counseling from [employer] [facility] [employer] at [phone]. Pt reports his mother and step father are currently staying at the [facility] house.  They will provide pt with transportation upon discharge.     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 He has attended [address] [facility] in [place], and [facility] in [place] for treatment.  He lived in a halfway house in [place] for a period of time when he was released from [employer] prison. He has been receiving counseling with [facility] for Bipolar Disorder and Substance Abuse.  Pt shared that he has been in sobriety for the last 2 years.  He discussed that he has had intestinal issues for many years . According to pt his first surgery was 11 years ago and currently.  Pt quit smoking in 2023. But picked it back up due to his nerves, and smokes a pack a week.      Pt would like assistance with reapplying for food stamps from [facility] office. SW will provide  in AVS. No other needs at this time.      Legal Next of Kin / Guardian / POA / Advance Directives    HCDM (patient stated preference): Leach,Carol - Mother - [phone]    HCDM, back-up (If primary HCDM is unavailable): Leach,Rick - Step Parent - [phone]    Advance Directive (Medical Treatment)  Does patient have an advance directive covering medical treatment?: Patient does not have advance directive covering medical treatment.  Reason patient does not have an advance directive covering medical treatment:: Patient needs follow-up to complete one.    Health Care Decision Maker [HCDM] (Medical & Mental Health Treatment)  Healthcare Decision Maker: Patient needs follow-up to appoint a Health Care Decision Maker.  Information offered on HCDM, Medical & Mental Health advance directives:: Patient declined information.    Advance Directive (Mental Health Treatment)  Does patient have an advance directive covering mental health treatment?: Patient does not have advance directive covering mental health treatment.  Reason patient does not have an advance directive covering mental health treatment:: Patient does not wish to complete one at this time.    Discharge Planning  Discharge Planning Information:   Type of Residence   Mailing Address:  [address]  [place] [address]    Medical Information   Past Medical History:   Diagnosis Date    Crohn disease (CMS-HCC)     History of pleural effusion 07/2021    R side    Pneumonia        Past Surgical History:   Procedure Laterality Date    ADENOIDECTOMY Bilateral     COLON SURGERY      eustachian Bilateral     ostomy reversal      PR COLONOSCOPY FLX DX W/COLLJ SPEC WHEN PFRMD N/A 02/20/2013    Procedure: COLONOSCOPY, FLEXIBLE, PROXIMAL TO SPLENIC FLEXURE; DIAGNOSTIC, W/WO COLLECTION SPECIMEN BY BRUSH OR WASH;  Surgeon: Theadore Nan, MD;  [facility] GI PROC[facility];  Service: Gastroenterology    PR COLONOSCOPY FLX DX W/COLLJ SPEC WHEN PFRMD N/A 07/18/2021    Procedure: COLONOSCOPY, FLEXIBLE, PROXIMAL TO SPLENIC FLEXURE; DIAGNOSTIC, W/WO COLLECTION SPECIMEN BY BRUSH OR WASH;  Surgeon: Luanne Bras, MD;  Location: [facility] PROC[facility];  Service: Gastroenterology    PR COLONOSCOPY FLX DX W/COLLJ SPEC WHEN PFRMD N/A 02/05/2023    Procedure: COLONOSCOPY, FLEXIBLE, PROXIMAL TO SPLENIC FLEXURE; DIAGNOSTIC, W/WO COLLECTION SPECIMEN BY BRUSH OR WASH;  Surgeon: Luanne Bras, MD;  Location: [facility];  Service: Gastroenterology    PR REMV PILONIDAL LESION SIMPLE N/A 05/01/2021    Procedure: EXCISION OF PILONIDAL CYST OR SINUS; SIMPLE;  Surgeon: Sharyn Lull, MD;  Location: MAIN OR [facility];  Service: Gastrointestinal    PR SURG DIAGNOSTIC EXAM, ANORECTAL N/A 05/01/2021    Procedure: ANORECTAL EXAM, SURGICAL, REQUIRING ANESTHESIA (GENERAL, SPINAL, OR EPIDURAL), DIAGNOSTIC;  Surgeon: Sharyn Lull, MD;  Location: MAIN OR [place];  Service: Gastrointestinal    TONSILLECTOMY         No family history on file.    Financial Information   Primary Insurance: Payor: Advertising copywriter MEDICARE ADV / Plan: UNITED HEALTHCARE DUAL COMPLETE HMO / Product Type: *No Product type* /    Secondary Insurance: Secondary Insurance  MEDICAID [place]   Prescription Coverage: Nurse, learning disability (listed above)   Preferred Pharmacy: [facility]  [facility]  [id number] - [place], [place] - [address] AT [employer]  OF [address]  [id number] - [place], [place] - [address] AT [address]    Barriers to taking medication: No    Transition Home   Transportation at time of discharge: Family/Friend's Private Vehicle    Anticipated changes related to Illness:  TBD   Services in place prior to admission: N/A   Services anticipated for DC:  TBD   Hemodialysis Prior to Admission: No    Readmission  Risk of Unplanned Readmission Score: UNPLANNED READMISSION SCORE: 15.74%  Readmitted Within the Last 30 Days?   Readmission Factors include: current reason for admission unrelated to previous admission    Social Determinants of Health  Social Drivers of Health     Food Insecurity: No Food Insecurity (02/15/2023)    Hunger Vital Sign     Worried About Running Out of Food in the Last Year: Never true     Ran Out of Food in the Last Year: Never true   Internet Connectivity: Not on file   Housing/Utilities: Low Risk  (02/15/2023)    Housing/Utilities     Within the past 12 months, have you ever stayed: outside, in a car, in a tent, in an overnight shelter, or temporarily in someone else's home (i.e. couch-surfing)?: No     Are you worried about losing your housing?: No     Within the past 12 months, have you been unable to get utilities (heat, electricity) when it was really needed?: No   Tobacco Use: High Risk (02/05/2023)    Patient History     Smoking Tobacco Use: Every Day     Smokeless Tobacco Use: Never     Passive Exposure: Not on file   Transportation Needs: No Transportation Needs (02/15/2023)    PRAPARE - Transportation     Lack of Transportation (Medical): No     Lack of Transportation (Non-Medical): No   Alcohol Use: Not on file   Interpersonal Safety: Not on file   Physical Activity: Inactive (08/28/2021)    Received from [facility], [facility]    Exercise Vital Sign     Days of Exercise per Week: 0 days     Minutes of Exercise per Session: 0 min   Intimate Partner Violence: Not At Risk (08/28/2021)    Received from [facility], [facility]    Humiliation, Afraid, Rape, and Kick questionnaire     Fear of Current or Ex-Partner: No     Emotionally Abused: No     Physically Abused: No     Sexually Abused: No   Stress: No Stress Concern Present (08/28/2021)    Received [facility], [facility]    [facility] - Occupational Stress Questionnaire     Feeling of Stress : Not at all   Substance Use: Not on file (11/18/2022)   Social Connections: Moderately Integrated (08/28/2021)    Received from [facility], [facility]    Social Connection and Isolation Panel [NHANES]     Frequency of Communication with Friends and Family: More than three times a week     Frequency of Social Gatherings with Friends and Family: More than three times a week     Attends Religious Services: More than 4 times per year     Active Member of [employer] or Organizations: No     Attends Engineer, structural: More than 4 times per year     Marital Status: Never married   Physicist, medical Strain: Low Risk  (  02/15/2023)    Overall Financial Resource Strain (CARDIA)     Difficulty of Paying Living Expenses: Not very hard   Depression: Not on file   Health Literacy: Not on file       Social History  Support Systems/Concerns: Concern for Caregiver Burnout, Patient Teacher, music Service: No Conservator, museum/gallery and Psychiatric History  Psychosocial Stressors: Coping with health challenges/recent hospitalization, Lack of Caregivers / Caregiver burn out      Psychological Issues/Information: Mental illness   Mental Illness Concerns: Patient history of mental illness          Chemical Dependency: Illicit drugs              Outpatient Providers: Primary Care Provider   Name / Contact #: : Hezzie Bump, PA-C (Sep. 05, 2022September 05, 2022 - Present)  NPI: [id number]  [phone] (Work)  [phone] Engineer, production)  Physician Assistant  Legal: No legal issues      Ability to [employer]: Unfamiliar with options/procedures for obtaining

## 2023-02-15 NOTE — Unmapped (Signed)
Inpatient Tobacco Cessation Counseling Note    This medical encounter was conducted virtually using [email or web address]  TeleHealth protocols.    I have identified myself to the patient and conveyed my credentials to Mr. Tribby  I have explained the capabilities and limitations of telemedicine and the patient/proxy and myself both agree that it is appropriate for their current circumstances/symptoms.     Contact Information  Person Contacted: Giuliano Preece Phone number: [phone]      Phone Outcome: spoke with pt  Is there someone else in the room? No.     Patient's location at the time of the telephone visit: Hospitalized at [facility]   Provider's location at the time of the telephone visit: At home, in [place]      Purpose of contact:     Pt participated in a telephone visit for tobacco cessation counseling.  Patient was admitted to hospital for Crohn's disease of colon with complication (CMS-HCC) [K50.119]. Patient consented to telephone visit given due to social isolation measures in place due to the COVID-19 pandemic.     Tobacco Use History and Assessment  Time Since Last Tobacco Use: 1 to 7 days ago  Tobacco Withdrawal (Past 24 Hours): Desire or craving to smoke  Type of Tobacco Products Used: Cigarettes  Quantity Used: 4  Quantity Per: day  Medications Used in Past Attempts: Nicotine Patch    Behavioral Assessment  Why Uses: 1. habit 2. stress-relief  Reasons to Become Tobacco Free: health  Barriers/Challenges: 1. long-standing habit 2. stress  Strategies: pt can use 1. NRT to manage cravings 2. hand-to-mouth replacements    NOTE: Pt endorses having cravings and states initially that a pack lasts him 1 week. Pt later reports he has 4-5cpd, sometimes more and that a pack lasts him closer to 4 days. SW and pt discussed recommendation to become tobacco-free in context of laparoscopic partial colectomy. SW reviewed toxic effects of smoking and pt reports he is motivated to quit and has nicotine patches at home. SW reviewed the nicotine patch has been ordered and pt can request RN put it on for him. SW and pt discussed benefits of using nicotine gum/lozenge to manage acute cravings. SW explained chew and park process associated with nicotine gum. SW and pt discussed hand-to-mouth replacements. Pt was experiencing pain during conversation and needed to speak with his nurse about medication management during call. SW provided pt with contact information, physical improvements related to tobacco cessation, and available resources (including outpatient Tobacco Treatment Program at [facility] and [facility]).    Treatment Plan  Please see below for medication recommendations in bold.   Cessation Meds Currently Using: None  Medications Recommended During Hospitalization: Patch 21mg , Lozenge 4mg , Gum 4mg   Outpatient/Discharge Medications Recommended: Patch 21mg , Lozenge 4mg , Gum 4mg   Plan to Obtain Outpatient Meds: TTS messaged providers for Rx  Patient's Plan Post Discharge/Visit: Plan to quit as soon as possible    As part of this Telephone Visit, no in-person exam was conducted.     I personally spent 13 minutes counseling the patient via telephone about tobacco cessation.  I spent an additional 9 minutes on pre- and post-visit activities.      The patient was physically located in [place] or a state in which I am permitted to provide care. The patient and/or parent/guardian understood that s/he may incur co-pays and cost sharing, and agreed to the telemedicine visit. The visit  was reasonable and appropriate under the circumstances given the patient's presentation at the time.     The patient and/or parent/guardian has been advised of the potential risks and limitations of this mode of treatment (including, but not limited to, the absence of in-person examination) and has agreed to be treated using telemedicine. The patient's/patient's family's questions regarding telemedicine have been answered.      If the visit was completed in an ambulatory setting, the patient and/or parent/guardian has also been advised to contact their provider???s office for worsening conditions, and seek emergency medical treatment and/or call 911 if the patient deems either necessary.     Visit Format/Coding: Telephone     Coding: 16109 (11-20 minutes)  Service rendered over the phone most consistent with: Tobacco cessation counseling, greater than 10 minutes (60454)      Venancio Poisson, LCSW, NCTTP  Clinical Social Worker / Tobacco Treatment Specialist  [facility]  Phone: [phone]

## 2023-02-15 NOTE — Unmapped (Deleted)
[facility] Clinical Assessment & Refill Coordination Note    Dakota Townsend, DOB: 04/17/1980  Phone: [phone] (home)     All above HIPAA information was verified with {Blank:19197::patient.,patient's caregiver, ***.,patient's family member, ***.}     Was a Nurse, learning disability used for this call? {Blank single:19197::Yes, ***. Patient language is appropriate in WAM,No}    Specialty Medication(s):   {specpharm:59087}     No current facility-administered medications for this visit.     No current outpatient medications on file.     Facility-Administered Medications Ordered in Other Visits   Medication Dose Route Frequency Provider Last Rate Last Admin      EXPAREL ADMINISTERED WITHIN 96 HRS - NO BUPIVACAINE FOR 96 HOURS AFTER EXPAREL  1 each Other Continuous Emilio Aspen, MD   1 each at 02/14/23 1221    acetaminophen (TYLENOL) tablet 1,000 mg  1,000 mg Oral Q6H SCH Emilio Aspen, MD   1,000 mg at 02/15/23 0517    albuterol (PROVENTIL HFA;VENTOLIN HFA) 90 mcg/actuation inhaler 2 puff  2 puff Inhalation Q4H PRN Emilio Aspen, MD        buprenorphine-naloxone (SUBOXONE) 2-0.5 mg SL film 2 mg of buprenorphine  1 Film Sublingual BID Rookard, Alaina A, PA        diazePAM (VALIUM) tablet 5 mg  5 mg Oral Q6H PRN Emilio Aspen, MD   5 mg at 02/15/23 0303    divalproex ER (DEPAKOTE ER) extended released 24 hr tablet 1,000 mg  1,000 mg Oral Nightly Lorre Nick, Edsel Petrin, MD/DMD   1,000 mg at 02/14/23 2305    enoxaparin (LOVENOX) syringe 40 mg  40 mg Subcutaneous Q24H SCH Emilio Aspen, MD        HYDROmorphone (DILAUDID) 50mg /63ml (1mg /ml) PCA CADD   Intravenous Continuous Rookard, Alaina A, PA        HYDROmorphone (PF) (DILAUDID) injection 1 mg  1 mg Intravenous Q3H PRN Vernie Murders, MD/DMD        Or    HYDROmorphone (DILAUDID) injection 2 mg  2 mg Intravenous Q3H PRN Vernie Murders, MD/DMD   2 mg at 02/15/23 2202    lactated ringers bolus 1,000 mL  1,000 mL Intravenous Once Nicholaus Corolla I, MD        lactated Ringers infusion  10 mL/hr Intravenous Continuous Emilio Aspen, MD        lactated Ringers infusion  125 mL/hr Intravenous Continuous Emilio Aspen, MD 125 mL/hr at 02/14/23 2029 125 mL/hr at 02/14/23 2029    lamoTRIgine (LaMICtal) tablet 200 mg  200 mg Oral Daily Emilio Aspen, MD        ondansetron (ZOFRAN-ODT) disintegrating tablet 4 mg  4 mg Oral Q6H PRN Emilio Aspen, MD        Or    ondansetron [facility]) injection 4 mg  4 mg Intravenous Q6H PRN Emilio Aspen, MD   4 mg at 02/14/23 2259    oxyCODONE (ROXICODONE) immediate release tablet 5 mg  5 mg Oral Q3H PRN Rookard, Alaina A, PA        Or    oxyCODONE (ROXICODONE) immediate release tablet 10 mg  10 mg Oral Q3H PRN Rookard, Alaina A, PA   10 mg at 02/15/23 0910        Changes to medications: {Blank:19197::Nathanuel reports starting the following medications: ***,Admir Reports stopping the following medications: ***,Silvino reports no changes at this time.}  Medication list has been reviewed and updated in Epic: {Blank single:19197::Yes,***}    Allergies   Allergen Reactions    Acetaminophen Nausea And Vomiting    Benadryl [Diphenhydramine Hcl] Anaphylaxis    Sulfa (Sulfonamide Antibiotics) Hives    Rozerem [Ramelteon] Other (See Comments)     Pt reports hallucinations, muscle contortions  & dry eyes    Shellfish Containing Products Diarrhea and Nausea And Vomiting     Can have shrimp but not scallops       Changes to allergies: {Blank:19197::Yes: ***,No}    Allergies have been reviewed and updated in Epic: {Blank single:19197::Yes,***}    SPECIALTY MEDICATION ADHERENCE     *** *** {Blank:19197::mg,mg/ml,***}: *** {Blank:19197::days,doses} of medicine on hand   *** *** {Blank:19197::mg,mg/ml,***}: *** {Blank:19197::days,doses} of medicine on hand   *** *** {Blank:19197::mg,mg/ml,***}: *** {Blank:19197::days,doses} of medicine on hand   *** *** {Blank:19197::mg,mg/ml,***}: *** {Blank:19197::days,doses} of medicine on hand   *** *** {Blank:19197::mg,mg/ml,***}: *** {Blank:19197::days,doses} of medicine on hand          Specialty medication(s) dose(s) confirmed: {Blank:19197::Regimen is correct and unchanged.,Patient reports changes to the regimen as follows: ***}     Are there any concerns with adherence? {Blank:19197::Yes: ***,No}    Adherence counseling provided? {Blank:19197::Yes: ***,Not needed}    CLINICAL MANAGEMENT AND INTERVENTION      Clinical Benefit Assessment:    Do you feel the medicine is effective or helping your condition? {Blank:19197::Yes,No,Patient declined to answer}    Clinical Benefit counseling provided? {Blank:19197::Not needed,Reasonable expectations discussed: ***,Labs from *** show evidence of clinical benefit,Progress note from *** shows evidence of clinical benefit,consulted provider regarding clinical benefit concerns,***}    Adverse Effects Assessment:    Are you experiencing any side effects? {Blank:19197::Yes, patient reports experiencing ***. Side effect counseling provided: ***,No}    Are you experiencing difficulty administering your medicine? {Blank:19197::Yes, patient reports ***. Medication administration counseling provided: ***,No}    Quality of Life Assessment:    Quality of Life    Rheumatology  Oncology  Dermatology  Cystic Fibrosis          {DiseaseSpecificQOL:73897}    Have you discussed this with your provider? {Blank:19197::Not needed,Yes,No - pharmacist will consult provider}    Acute Infection Status:    Acute infections noted within Epic:  No active infections  Patient reported infection: {Blank single:19197::None,***- patient reported to provider,***- pharmacy reported to provider}    Therapy Appropriateness:    Is therapy appropriate based on current medication list, adverse reactions, adherence, clinical benefit and progress toward achieving therapeutic goals? {Blank:19197::Yes, therapy is appropriate and should be continued,Pharmacist will consult provider}     DISEASE/MEDICATION-SPECIFIC INFORMATION      {clinicspecificinstructions:59274}    {DISEASESTATESPECIFICASSESSMENT:98894}    PATIENT SPECIFIC NEEDS     Does the patient have any physical, cognitive, or cultural barriers? {Blank single:19197::No,Yes - ***}    Is the patient high risk? {sschighriskpts:78327}    Did the patient require a clinical intervention? {Blank single:19197::No,Yes (If yes, document using the sscrphintervention smartphrase)}    Does the patient require physician intervention or other additional services (i.e., nutrition, smoking cessation, social work)? {Blank single:19197::No,Yes, ***}    Does the patient have an additional or emergency contact listed in their chart? {Blank single:19197::Yes,No, patient refused.,***}    SOCIAL DETERMINANTS OF HEALTH     At the [facility], we have learned that life circumstances - like trouble affording food, housing, utilities, or transportation can affect the health of many of our patients.   That is  why we wanted to ask: are you currently experiencing any life circumstances that are negatively impacting your health and/or quality of life? {YES/NO/PATIENTDECLINED:93004}    Social Drivers of Health     Food Insecurity: No Food Insecurity (08/28/2021)    Received from [facility], [facility]    Hunger Vital Sign     Worried About Running Out of Food in the Last Year: Never true     Ran Out of Food in the Last Year: Never true   Internet Connectivity: Not on file   Housing/Utilities: Not on file   Tobacco Use: High Risk (02/05/2023)    Patient History     Smoking Tobacco Use: Every Day     Smokeless Tobacco Use: Never     Passive Exposure: Not on file   Transportation Needs: No Transportation Needs (08/28/2021)    Received from [facility], [facility]    [facility] - Transportation Lack of Transportation (Medical): No     Lack of Transportation (Non-Medical): No   Alcohol Use: Not on file   Interpersonal Safety: Not on file   Physical Activity: Inactive (08/28/2021)    Received from [facility], [facility]    Exercise Vital Sign     Days of Exercise per Week: 0 days     Minutes of Exercise per Session: 0 min   Intimate Partner Violence: Not At Risk (08/28/2021)    Received from [facility], [facility]    Humiliation, Afraid, Rape, and Kick questionnaire     Fear of Current or Ex-Partner: No     Emotionally Abused: No     Physically Abused: No     Sexually Abused: No   Stress: No Stress Concern Present (08/28/2021)    Received from [facility], [facility]    [facility] - Occupational Stress Questionnaire     Feeling of Stress : Not at all   Substance Use: Not on file (11/18/2022)   Social Connections: Moderately Integrated (08/28/2021)    Received from [facility], [facility]    Social Connection and Isolation Panel [NHANES]     Frequency of Communication with Friends and Family: More than three times a week     Frequency of Social Gatherings with Friends and Family: More than three times a week     Attends Religious Services: More than 4 times per year     Active Member of [employer] or Organizations: No     Attends Engineer, structural: More than 4 times per year     Marital Status: Never married   Physicist, medical Strain: Low Risk  (08/28/2021)    Received from [facility], [facility]    Overall Financial Resource Strain (CARDIA)     Difficulty of Paying Living Expenses: Not hard at all   Depression: Not on file   Health Literacy: Not on file       Would you be willing to receive help with any of the needs that you have identified today? {Yes/No/Not applicable:93005}       SHIPPING     Specialty Medication(s) to be Shipped:   {specpharm:59087}    Other medication(s) to be shipped: {Blank:19197::***,No additional medications requested for fill at this time}     Changes to insurance: {Blank:19197::Yes: ***,No}    Delivery Scheduled: {Blank:19197::Yes, Expected medication delivery date: ***.,Yes, Expected medication delivery date: ***.  However, Rx request for refills was sent to the provider as there are none remaining.,Patient declined refill at  this time due to ***.,No, cannot schedule delivery at this time as there are outstanding items that need addressed.  This note has been handed off to the provider for follow up.,Due to patient insurance changes, unable to fill at [facility], please route Rx to *** specialty pharmacy}     Medication will be delivered via {Blank:19197::UPS,Next Day Courier,Same Day Courier,Clinic Courier - *** clinic,***} to the confirmed {Blank:19197::prescription,temporary} address in [facility].    The patient will receive a drug information handout for each medication shipped and additional FDA Medication Guides as required.  Verified that patient has previously received a Conservation officer, historic buildings and a Surveyor, mining.    The patient or caregiver noted above participated in the development of this care plan and knows that they can request review of or adjustments to the care plan at any time.      All of the patient's questions and concerns have been addressed.    Norva Karvonen   [facility] Specialty Pharmacist

## 2023-02-15 NOTE — Unmapped (Signed)
=  Department of Anesthesiology  Pain Medicine Division    Chronic Pain Consult Note    Requesting Attending Physician:  Sharyn Lull, MD  Service Requesting Consult:  Henreitta Leber [facility])    Assessment/Recommendations:  The patient was seen in consultation on request of Sharyn Lull, MD regarding assistance with pain management. The patient is not obtaining adequate pain relief on current medication regimen.     The patient is a 43 y.o. male w/PMH Crohn's disease s/p previous ileocolic resections, pneumonia, admitted on 02/14/23, now POD#1 s/p laparoscopic partial colectomy with removal of terminal ileum and ileocolostomy.    We were consulted for complex pain management.    Home Analgesic Medications:  Suboxone 8-2 mg SL BID  OTC APAP 1g PO q4h PRN    [place] PDMP Queried 02/15/23, Showing:  Suboxone 8-2 mg SL qty:21 on 02/11/23  Alprazolam 0.5 mg qty_28 on 02/10/22    Hospital Analgesic Medications:  APAP 1g PO q6h [facility]  Suboxone 2-0.5 mg SL BID  Hydromorphone PCA 0.3 mg/q19min (just ordered)  Hydromorphone 1-2 mg IV q3h PRN  Oxycodone 5-10 mg PO q3h PRN    PRN Medication Requirements: IV HM x3, PO oxycodone x2    Recommendations:  -The chronic pain service is a consult service and does not place orders, just makes recommendations (except ketamine and lidocaine infusions)  -Please evaluate all patients on opioids for appropriateness of prescribing narcan at discharge.  The chronic pain service can assist with this.  Nasal narcan covered by most insurance.  -Recommendations given apply to the current hospitalization and do not reflect long term recommendations.    -Continue APAP 1g PO q8h [facility]  -Continue Suboxone 2-0.5 mg SL BID, may increase to TID tomorrow (02/16/23) if needed  -Agree with starting dPCA at 0.3mg /q6 min  -Continue hydromorphone 1-2 mg IV q3h PRN  -Continue oxycodone 5-10 mg PO q3h PRN    -Patient is on a low dose ketamine infusion for pain   -Ketamine gtt #1 of 4 maximum over a 12 month period -All ketamine orders will be managed by the [facility] Chronic Pain Service only-orders can only be placed by attendings and fellows   -Ketamine requires dedicated IV (PIV or central line lumen)   -Start ketamine infusion: 0.1mg /kg/hr Actual body weight (BMI <40-Actual BW, BMI >40-IBW)   -Patient location: Step down: dose can increase every 4 hours (can decrease anytime)   -Day 0 (max-7 days). Started (date): 02/15/23   -Prior to starting: Recent EKG, CMP (creatinine, LFTs), urine pregnancy test (if applicable)   -Daily CMP (creatinine and LFTs daily) while running   -Please contact the Chronic Pain service with any questions or concerns about ketamine infusion.    Home MME: N/A - on buprenorphine  Current MME: 140    We will continue to follow.    Naloxone Rx at discharge?  Is patient on opioids? Yes.  1)Is dose >50MME?  Yes.  2) Is patient prescribed a benzodiazepine (w opioids)? No.  3)Hx of overdose?  No.  4) Hx of substance use disorder? Yes.  5) Opioids likely to last greater than a week after discharge? Yes.    If yes to 2 or more, prescribe naloxone at discharge.  Nasal narcan for most insured (Nasal narcan 4mg /actuation, prescribe 1 kit, instructions at [email or web address]).  For uninsured, chronic pain can work to assist in finding an option.  OTC nasal narcan now available at most pharmacies for around $45.    History of  Present Illness:    Reason for Consult:  complex pain management    The pain started after surgery which was on 02/14/23..  The patient describes His pain as aching.   He rates His pain level as 10/10.  The pain is constant and tends to be continuous . The symptoms do not radiate.    The patient reports ongoing generalized abdominal pain post-surgery. He has been receiving IV PRN hydromorphone and oral oxycodone with partial relief of his pain. He's having nausea but no vomiting. Has not had a bowel movement yet. He normally takes Suboxone at home, prescribed 8-2 mg SL BID but had been down-titrating his dose prior to surgery, and was down to 4 mg SL total per day right before admission. Otherwise he does not use any pain medications at home.    Analgesia Evaluation:  Pain at minimum: 7/10  Pain at maximum: 10/10     Prior to admission, the patient was on home opiate medications.  Hehas not been followed by a pain clinic.  The [place] CSRS was reviewed.    Allergies    Allergies   Allergen Reactions    Acetaminophen Nausea And Vomiting    Benadryl [Diphenhydramine Hcl] Anaphylaxis    Sulfa (Sulfonamide Antibiotics) Hives    Rozerem [Ramelteon] Other (See Comments)     Pt reports hallucinations, muscle contortions  & dry eyes    Shellfish Containing Products Diarrhea and Nausea And Vomiting     Can have shrimp but not scallops       Home Medications    Medications Prior to Admission   Medication Sig Dispense Refill Last Dose/Taking    ALPRAZolam (XANAX) 0.25 MG tablet Take 1 tablet (0.25 mg total) by mouth daily as needed.   02/14/2023 Morning    acetaminophen (TYLENOL) 500 MG tablet Take 2 tablets (1,000 mg total) by mouth every four (4) hours as needed.       albuterol HFA 90 mcg/actuation inhaler Inhale 2 puffs every four (4) hours as needed.       bisacodyl (DULCOLAX) 5 mg EC tablet Take 2 tablets (10 mg total) by mouth Take as directed. Take 2 tablets (10 mg total) as directed for bowel prep. 2 tablet 0     [EXPIRED] bisacodyl (DULCOLAX) 5 mg EC tablet Take 4 tablets (20 mg total) by mouth once for 1 dose. Take with two 8 oz glasses of clear liquid at 9:00 am the day prior to surgery. 4 tablet 0     [EXPIRED] bisacodyl (DULCOLAX) 5 mg EC tablet Take 4 tablets (20 mg total) by mouth once for 1 dose. Take with two 8 oz glasses of clear liquid at 9:00 am the day prior to surgery. 4 tablet 0     buprenorphine-naloxone (SUBOXONE) 8-2 mg sublingual film TAKE ONE FILM DAILY (Patient taking differently: Taking a half strip BID)       ciprofloxacin HCl (CIPRO) 500 MG tablet Take 500mg  (1 tablet) at 2pm and 10pm the day before surgery 2 tablet 0     ciprofloxacin HCl (CIPRO) 500 MG tablet Take 500mg  (1 tablet) at 2pm and 10pm the day before surgery 2 tablet 0     dicyclomine (BENTYL) 10 mg capsule Take 1 capsule (10 mg total) by mouth Four (4) times a day (before meals and nightly). 120 capsule 11     divalproex ER (DEPAKOTE ER) 500 MG extended released 24 hr tablet TAKE 2 TABLETS BY MOUTH WITH SUPPER DAILY  empty container Misc Use as directed 1 each 3     lamoTRIgine (LAMICTAL) 200 MG tablet Take 1 tablet (200 mg total) by mouth.       levalbuterol (XOPENEX HFA) 45 mcg/actuation inhaler Inhale 1 puff every four (4) hours as needed.       loperamide (IMODIUM) 2 mg capsule TAKE 1-2 CAPSULES BY MOUTH EVERY 4 HOURS AS NEEDED FOR DIARRHEA 120 capsule 5     metroNIDAZOLE (FLAGYL) 500 MG tablet Take 500mg  (1 tablet) at 2pm and 10pm the day before surgery 2 tablet 0     metroNIDAZOLE (FLAGYL) 500 MG tablet Take 500mg  (1 tablet) at 2pm and 10pm the day before surgery 2 tablet 0     nicotine (NICODERM CQ) 21 mg/24 hr patch Place 1 patch on the skin daily.       ondansetron (ZOFRAN-ODT) 4 MG disintegrating tablet Dissolve 1 tablet (4 mg total) in the mouth every eight (8) hours as needed for nausea. 90 tablet 5     polyethylene glycol (CLEARLAX) 17 gram/dose powder Take as directed for extended bowel prep. 238 g 0     [EXPIRED] polyethylene glycol (GLYCOLAX) 17 gram/dose powder Mix 238 g (contents of full bottle) in 64oz of Gatorade. Drink 8oz of mixture every 15 min until gone starting at 11am day prior to surgery. 238 g 0     [EXPIRED] polyethylene glycol (GOLYTELY) 236-22.74-6.74 gram solution Take 4,000 mL by mouth once for 1 dose. Take as directed for split bowel prep. 4000 mL 0     [EXPIRED] polyethylene glycol (GLYCOLAX) 17 gram/dose powder Take 238 g by mouth once for 1 dose. Mix in 64oz of Gatorade. Drink 8oz of mixture  every 15 min until gone starting at 11am day prior to surgery. 238 g 0     risankizumab-rzaa (SKYRIZI) 360 mg/2.4 mL (150 mg/mL) Injt Inject the contents of 1 cartridge (360mg ) under the skin every 8 weeks 2.4 mL 2        Inpatient Medications  Current Facility-Administered Medications   Medication Dose Route Frequency Provider Last Rate Last Admin      EXPAREL ADMINISTERED WITHIN 96 HRS - NO BUPIVACAINE FOR 96 HOURS AFTER EXPAREL  1 each Other Continuous Emilio Aspen, MD   1 each at 02/14/23 1221    acetaminophen (TYLENOL) tablet 1,000 mg  1,000 mg Oral Q6H SCH Emilio Aspen, MD   1,000 mg at 02/15/23 0517    albuterol (PROVENTIL HFA;VENTOLIN HFA) 90 mcg/actuation inhaler 2 puff  2 puff Inhalation Q4H PRN Emilio Aspen, MD        buprenorphine-naloxone (SUBOXONE) 2-0.5 mg SL film 2 mg of buprenorphine  1 Film Sublingual BID Rookard, Alaina A, PA        diazePAM (VALIUM) tablet 5 mg  5 mg Oral Q6H PRN Emilio Aspen, MD   5 mg at 02/15/23 0958    divalproex ER (DEPAKOTE ER) extended released 24 hr tablet 1,000 mg  1,000 mg Oral Nightly Lorre Nick, Edsel Petrin, MD/DMD   1,000 mg at 02/14/23 2305    enoxaparin (LOVENOX) syringe 40 mg  40 mg Subcutaneous Q24H SCH Emilio Aspen, MD        HYDROmorphone (DILAUDID) 50mg /56ml (1mg /ml) PCA CADD   Intravenous Continuous Rookard, Alaina A, PA   New Syringe/Cartridge at 02/15/23 0954    HYDROmorphone (PF) (DILAUDID) injection 1 mg  1 mg Intravenous Q3H PRN Vernie Murders, MD/DMD        Or  HYDROmorphone (DILAUDID) injection 2 mg  2 mg Intravenous Q3H PRN Vernie Murders, MD/DMD   2 mg at 02/15/23 1610    ketamine 500 mg in sodium chloride 0.9 % 250 mL (2 mg/mL)  0.1 mg/kg/hr Intravenous Continuous Cloyde Reams D, DO        lactated ringers bolus 1,000 mL  1,000 mL Intravenous Once Nicholaus Corolla I, MD        lactated Ringers infusion  10 mL/hr Intravenous Continuous Emilio Aspen, MD        lactated Ringers infusion  125 mL/hr Intravenous Continuous Emilio Aspen, MD 125 mL/hr at 02/14/23 2029 125 mL/hr at 02/14/23 2029 lamoTRIgine (LaMICtal) tablet 200 mg  200 mg Oral Daily Emilio Aspen, MD        ondansetron (ZOFRAN-ODT) disintegrating tablet 4 mg  4 mg Oral Q6H PRN Emilio Aspen, MD        Or    ondansetron [facility]) injection 4 mg  4 mg Intravenous Q6H PRN Emilio Aspen, MD   4 mg at 02/14/23 2259    oxyCODONE (ROXICODONE) immediate release tablet 5 mg  5 mg Oral Q3H PRN Rookard, Alaina A, PA        Or    oxyCODONE (ROXICODONE) immediate release tablet 10 mg  10 mg Oral Q3H PRN Rookard, Alaina A, PA   10 mg at 02/15/23 0910    sodium chloride (NS) 0.9 % infusion  10 mL/hr Intravenous Continuous Theodis Sato, DO         Past Medical History    Past Medical History:   Diagnosis Date    Crohn disease (CMS-HCC)     History of pleural effusion 07/2021    R side    Pneumonia        Past Surgical History    Past Surgical History:   Procedure Laterality Date    ADENOIDECTOMY Bilateral     COLON SURGERY      eustachian Bilateral     ostomy reversal      PR COLONOSCOPY FLX DX W/COLLJ SPEC WHEN PFRMD N/A 02/20/2013    Procedure: COLONOSCOPY, FLEXIBLE, PROXIMAL TO SPLENIC FLEXURE; DIAGNOSTIC, W/WO COLLECTION SPECIMEN BY BRUSH OR WASH;  Surgeon: Theadore Nan, MD;  Location: GI PROCEDURES [facility];  Service: Gastroenterology    PR COLONOSCOPY FLX DX W/COLLJ SPEC WHEN PFRMD N/A 07/18/2021    Procedure: COLONOSCOPY, FLEXIBLE, PROXIMAL TO SPLENIC FLEXURE; DIAGNOSTIC, W/WO COLLECTION SPECIMEN BY BRUSH OR WASH;  Surgeon: Luanne Bras, MD;  Location: [facility] PROCEDURES [facility];  Service: Gastroenterology    PR COLONOSCOPY FLX DX W/COLLJ SPEC WHEN PFRMD N/A 02/05/2023    Procedure: COLONOSCOPY, FLEXIBLE, PROXIMAL TO SPLENIC FLEXURE; DIAGNOSTIC, W/WO COLLECTION SPECIMEN BY BRUSH OR WASH;  Surgeon: Luanne Bras, MD;  Location: [facility] PROCEDURES [facility];  Service: Gastroenterology    PR REMV PILONIDAL LESION SIMPLE N/A 05/01/2021    Procedure: EXCISION OF PILONIDAL CYST OR SINUS; SIMPLE;  Surgeon: Sharyn Lull, MD;  Location: MAIN OR [place];  Service: Gastrointestinal    PR SURG DIAGNOSTIC EXAM, ANORECTAL N/A 05/01/2021    Procedure: ANORECTAL EXAM, SURGICAL, REQUIRING ANESTHESIA (GENERAL, SPINAL, OR EPIDURAL), DIAGNOSTIC;  Surgeon: Sharyn Lull, MD;  Location: MAIN OR [place];  Service: Gastrointestinal    TONSILLECTOMY       Family History    No family history on file.    Social History:  Social History     Tobacco Use  Smoking Status Every Day    Current packs/day: 0.00    Average packs/day: 1 pack/day for 24.1 years (24.1 ttl pk-yrs)    Types: Cigarettes    Start date: 06/11/1997    Last attempt to quit: 07/08/2021    Years since quitting: 1.6   Smokeless Tobacco Never   Tobacco Comments    using nicotine patch and nicotrol inhaler.     Social History     Substance and Sexual Activity   Alcohol Use Yes    Alcohol/week: 5.0 standard drinks of alcohol    Types: 5 Standard drinks or equivalent per week    Comment: occassional     Social History     Substance and Sexual Activity   Drug Use Not Currently     Review of Systems  A 12 system review of systems was negative except as noted in HPI    Pertinent Imaging/Labs:  Lab Results   Component Value Date    CREATININE 0.69 (L) 02/15/2023       Lab Results   Component Value Date    ALKPHOS 80 01/29/2023    BILITOT 0.3 01/29/2023    BILIDIR 0.20 11/01/2022    PROT 7.0 01/29/2023    ALBUMIN 3.7 01/29/2023    ALT 10 01/29/2023    AST 17 01/29/2023     Objective:     Vital Signs  Temp:  [36.3 ??C (97.3 ??F)-37.5 ??C (99.5 ??F)] 36.9 ??C (98.5 ??F)  Heart Rate:  [53-84] 66  SpO2 Pulse:  [54-86] 58  Resp:  [12-22] 16  BP: (102-151)/(78-103) 151/96  MAP (mmHg):  [95-115] 115  SpO2:  [94 %-100 %] 98 %    Physical Exam  GENERAL:  Well developed, well-nourished male and is in moderate distress.   HEAD/NECK:    Reveals normocephalic/atraumatic.   CARDIOVASCULAR:   Warm, well perfused  LUNGS:   Normal work of breathing, no supplemental 02  EXTREMITIES:  Warm, no clubbing, cyanosis, or edema was noted.  NEUROLOGIC:    The patient was alert and oriented times four with normal language, attention, cognition and memory. MAEW. Cranial nerve exam was grossly normal.   MUSCULOSKELETAL:    No deformities.  SKIN:  No obvious rashes lesions or erythema  PSY:  Appropriate affect and mood.    Problem List  Principal Problem:    Crohn's disease of colon with complication (CMS-HCC)    Cloyde Reams, DO  Pain Medicine Fellow  Department of Anesthesiology  [facility]

## 2023-02-15 NOTE — Unmapped (Signed)
Patient is A&OX4. VSS. Surgical site intact. Pain controlled with prn oxycodone and IV Dilaudid. PRN Zofran given for nausea Foley catheter in place, urine output recorded. All meds given as ordered. MIVF Infusing.  No other issue noted. Care plan reviewed    Problem: Wound  Goal: Optimal Coping  Outcome: Progressing  Goal: Optimal Functional Ability  Outcome: Progressing  Goal: Absence of Infection Signs and Symptoms  Outcome: Progressing  Goal: Improved Oral Intake  Outcome: Progressing  Goal: Optimal Pain Control and Function  Outcome: Progressing  Goal: Skin Health and Integrity  Outcome: Progressing  Goal: Optimal Wound Healing  Outcome: Progressing     Problem: Adult Inpatient Plan of Care  Goal: Plan of Care Review  Outcome: Progressing  Goal: Patient-Specific Goal (Individualized)  Outcome: Progressing  Goal: Absence of Hospital-Acquired Illness or Injury  Outcome: Progressing  Goal: Optimal Comfort and Wellbeing  Outcome: Progressing  Goal: Readiness for Transition of Care  Outcome: Progressing  Goal: Rounds/Family Conference  Outcome: Progressing     Problem: Pain Acute  Goal: Optimal Pain Control and Function  Outcome: Progressing     Problem: Fall Injury Risk  Goal: Absence of Fall and Fall-Related Injury  Outcome: Progressing

## 2023-02-15 NOTE — Unmapped (Signed)
Colorectal Surgery Progress Note    Today's Date: 02/15/2023  Admission Date: 02/14/2023  Length of Stay: Day 1, 1 Day Post-Op  Hospital Service: Henreitta Leber [facility])  Attending Physician: Sharyn Lull, MD     Assessment   Principal Problem:    Crohn's disease of colon with complication (CMS-HCC)    Dakota Townsend is a 43 y.o. male with a medical history significant for Crohn's with several previous ileocolic resections. The patient is s/p laparoscopic partial colectomy with removal of terminal ileum with ileocolostomy on 2/6.    Subjective   10/10 pain O/N. Wincing in pain during AM rounds. Feels pain is increasing his nausea. Denies vomiting.     Alterations made to pain regimen as detailed below. Consult acute pain for potential ketamine. Maintain CLD; 1L LR bolus; keep foley.   Plan   Neuro/Psych:  #Pain control  - SCH: PO tylenol 1000q6, home buprenorphine  - PRN: dPCA (0.3q6, max 12), IV dilaudid 1q3, Oxy10q3    CV/Pulm:  - HDS, oxygenating well on RA    FEN/GI:  F: 125cc/hr LR (+1L LR bolus)  E: Replete PRN  N: CLD, clear supplements    - Strict I/O's    Renal/GU:  - Maintain foley     Heme/ID:  - P/o Hgb 11.9, Hgb 2/7 9.4. CTM     Prophylaxis: prophylactic Lovenox, SCDs, out of bed/ambulate TID, IS, PPI  - Patient/family learning Lovenox injections for home  - Plan to continue Lovenox for 28 days from surgery in setting of inflammatory bowel disease history and the increased VTE risk    Access: peripheral IV    Dispo: floor status    Objective   Vital signs over the last 24 hours:  Temp:  [36.3 ??C (97.3 ??F)-37.5 ??C (99.5 ??F)] 37 ??C (98.6 ??F)  Heart Rate:  [53-84] 60  SpO2 Pulse:  [54-86] 58  Resp:  [12-22] 16  BP: (102-149)/(78-103) 131/84  MAP (mmHg):  [95-101] 98  SpO2:  [94 %-100 %] 95 %    Wt Readings from Last 5 Encounters:   02/14/23 68.4 kg (150 lb 12.7 oz)   02/11/23 68.2 kg (150 lb 5.7 oz)   02/05/23 72.6 kg (160 lb)   01/29/23 72.6 kg (160 lb)   01/24/23 71.7 kg (158 lb)      Physical Exam:  Gen: alert, in mild distress, anxious  CV: regular rate, normotensive  Pulm: breathing comfortably on room air  Abd: soft, nondistended, and appropriately tender to palpation. Surgical incisions approximated with Dermabond, clean and dry.   Ext: warm and well perfused bilaterally, no edema    Nicholaus Corolla, MD  PGY-1 Vascular Surgery

## 2023-02-15 NOTE — Unmapped (Addendum)
Adult Nutrition Assessment Note    Visit Type: MD Consult  Reason for Visit: Assessment (Nutrition)    NUTRITION INTERVENTIONS and RECOMMENDATION     Recommend Ensure Clear TID  Weigh weekly  Document PO intakes  Advance diet as medically appropriate  Once diet is advanced, recommend Ensure Plus High Protein added- vanilla or strawberry    NUTRITION ASSESSMENT     Current  nutrition therapy is appropriate although not meeting nutritional needs at this time due to clear liquid diet.   Patient would benefit from continuing oral supplement to better meet nutritional needs.  Patient not appropriate to trial regular diet today per MD- patient to continue clear liquid diet  Patient consuming ensure clear; likes regular ensures as well when not on clear liquid diet    NUTRITIONALLY RELEVANT DATA     HPI & PMH:    43 y.o. male who is known to me (I previously performed trephination for pilonidal cyst).  He has Crohn's disease and has undergone several previous ileocolic resections; he had an ostomy following one of the resections.  It has been 11 years since his last procedure.  He started [employer] 16 weeks ago. Immediately after his infusion, he gets constipated and then it returns to diarrhea.  He previous was on Rinvoq, Humira (longest), Remicade (reaction), Stelara.  He was on steroids for two months, but has been off steroids for at least two months.  He reports that his weight is down ~5-10 lbs and he has been having intermittent nausea 4-5 times per week.  He has also been getting intermittently bloated.     Nutrition History:   02/07- Patient endorses poor appetite and intake over the past year. Patient with intermittent nausea 4-5 times per week per H&P. Patient with ongoing diarrhea due to chron's disease. Patient with several previous ileocolic resections. Patient consumes  Ensure supplement as needed at home.     Medications:  Nutritionally pertinent medications reviewed and evaluated for potential food and/or medication interactions.  LR infusion at 125 ml/hr    Labs:   Nutritionally pertinent labs reviewed and include Magnesium: 1.4 mg/dL    Nutritional Needs:   Healthy balance of carbohydrate, protein, and fat.     Anthropometric Data:  Height: 180.3 cm (5' 10.98)   Admission weight: 68.4 kg (150 lb 12.7 oz)  Last recorded weight: 68.4 kg (150 lb 12.7 oz)  Date of last recorded weight: 01/28  IBW: 78.03 kg  BMI: Body mass index is 21.04 kg/m??.   Usual Body Weight:  160 lb  Weight Assessment: 8 lb/5% loss noted over the past month- considered significant  Wt Readings from Last 25 Encounters:   02/14/23 68.4 kg (150 lb 12.7 oz)   02/11/23 68.2 kg (150 lb 5.7 oz)   02/05/23 72.6 kg (160 lb)   01/29/23 72.6 kg (160 lb)   01/24/23 71.7 kg (158 lb)   12/31/22 74.8 kg (165 lb)   12/28/22 74.8 kg (165 lb)   11/29/22 73.7 kg (162 lb 6.4 oz)   11/01/22 70.3 kg (155 lb)   10/22/22 72.1 kg (159 lb)   10/11/22 74.8 kg (164 lb 12.8 oz)   09/06/22 73.9 kg (163 lb)   07/05/22 70.3 kg (155 lb)   12/28/21 71.7 kg (158 lb)   09/19/21 66.6 kg (146 lb 14.4 oz)   07/18/21 63.5 kg (140 lb)   07/04/21 66.3 kg (146 lb 3.2 oz)   05/30/21 68.9 kg (151 lb 12.8 oz)  04/05/21 69.5 kg (153 lb 3.5 oz)   02/09/21 69.2 kg (152 lb 9.6 oz)   09/08/20 69.3 kg (152 lb 12.8 oz)   06/15/14 64 kg (141 lb)   05/11/14 65.3 kg (144 lb)   04/29/14 62.6 kg (138 lb)   10/13/13 68.3 kg (150 lb 9.6 oz)       Malnutrition Assessment:  Malnutrition Assessment using AND/ASPEN or GLIM Clinical Characteristics:    Severe Protein-Calorie Malnutrition in the context of chronic illness (02/15/23 1216)  Energy Intake: < or equal to 75% of estimated energy requirement for > or equal to 1 month  Interpretation of Wt. Loss: > 7.5% x 3 month  Fat Loss: Severe  Muscle Loss: Severe  Malnutrition Score: 4            Nutrition Focused Physical Exam:  Nutrition Focused Physical Exam:  Fat Areas Examined  Upper Arm: Severe loss      Muscle Areas Examined  Temple: Severe loss  Clavicle: Severe loss  Acromion: Severe loss  Patellar: Severe loss  Anterior Thigh: Severe loss  Posterior Calf: Severe loss              Nutrition Evaluation  Overall Impressions: Severe fat loss, Severe muscle loss (02/15/23 1216)     Care plan:  Completed    Current Nutrition:  Oral intake   Nutrition Orders            Supplement Adult; Ensure Clear (Clear Liquid); # of Products PER Serving: 2 Mid-morning starting at 02/07 0900    Nutrition Therapy Clear Liquid; No Carbonation, No Straw starting at 02/06 1834            Nutritionally Pertinent Allergies, Intolerances, Sensitivities, and/or Cultural/Religious Restrictions:  include shellfish    GOALS and EVALUATION     Patient to remain NPO and/or on a clear liquid diet less than 3-4 days before diet advancement. - New    Motivation, Barriers, and Compliance:  Evaluation of motivation, barriers, and compliance completed. No concerns identified at this time.     Discharge Planning:   Monitor for potential discharge needs with multi-disciplinary team.         Follow-Up Parameters:   1-2 times per week (and more frequent as indicated)    Alesia Morin, RD, LDN

## 2023-02-15 NOTE — Unmapped (Incomplete Revision)
Social Work  Psychosocial Assessment    Patient Name: Dakota Townsend   Medical Record Number: [id number]   Date of Birth: September 22, 1980  Sex: Male     Referral  Referred by: Care Manager  Reason for Referral: Other - specify in comments (suboxone/ pt with anxiety)  Comment: suboxone ,   pt with anxiety    Extended Emergency Contact Information  Primary Emergency Contact: Alvera Singh [place] of [place]  Home Phone: [phone]  Relation: Mother  Secondary Emergency Contact: Leach,Rick  Mobile Phone: [phone]  Relation: Step Parent    Per H & P  Dakota Townsend is a 43 y.o. male who is known to me (I previously performed trephination for pilonidal cyst).  He has Crohn's disease and has undergone several previous ileocolic resections; he had an ostomy following one of the resections.  It has been 11 years since his last procedure.  He started [employer] 16 weeks ago. Immediately after his infusion, he gets constipated and then it returns to diarrhea.  He previous was on Rinvoq, Humira (longest), Remicade (reaction), Stelara.  He was on steroids for two months, but has been off steroids for at least two months.  He reports that his weight is down ~5-10 lbs and he has been having intermittent nausea 4-5 times per week.  He has also been getting intermittently bloated.     SW Assessment  SW met with pt along bedside.     Legal Next of Kin / Guardian / POA / Advance Directives    HCDM (patient stated preference): Leach,Carol - Mother - [phone]    HCDM, back-up (If primary HCDM is unavailable): Leach,Rick - Step Parent - [phone]    Advance Directive (Medical Treatment)  Does patient have an advance directive covering medical treatment?: Patient does not have advance directive covering medical treatment.  Reason patient does not have an advance directive covering medical treatment:: Patient needs follow-up to complete one.    Health Care Decision Maker [HCDM] (Medical & Mental Health Treatment)  Healthcare Decision Maker: Patient needs follow-up to appoint a Health Care Decision Maker.  Information offered on HCDM, Medical & Mental Health advance directives:: Patient declined information.    Advance Directive (Mental Health Treatment)  Does patient have an advance directive covering mental health treatment?: Patient does not have advance directive covering mental health treatment.  Reason patient does not have an advance directive covering mental health treatment:: Patient does not wish to complete one at this time.    Discharge Planning  Discharge Planning Information:   Type of Residence   Mailing Address:  [address]  [place] [address]    Medical Information   Past Medical History:   Diagnosis Date    Crohn disease (CMS-HCC)     History of pleural effusion 07/2021    R side    Pneumonia        Past Surgical History:   Procedure Laterality Date    ADENOIDECTOMY Bilateral     COLON SURGERY      eustachian Bilateral     ostomy reversal      PR COLONOSCOPY FLX DX W/COLLJ SPEC WHEN PFRMD N/A 02/20/2013    Procedure: COLONOSCOPY, FLEXIBLE, PROXIMAL TO SPLENIC FLEXURE; DIAGNOSTIC, W/WO COLLECTION SPECIMEN BY BRUSH OR WASH;  Surgeon: Theadore Nan, MD;  Location: GI PROCEDURES [facility];  Service: Gastroenterology    PR COLONOSCOPY FLX DX W/COLLJ SPEC WHEN PFRMD N/A 07/18/2021    Procedure: COLONOSCOPY, FLEXIBLE, PROXIMAL TO  SPLENIC FLEXURE; DIAGNOSTIC, W/WO COLLECTION SPECIMEN BY BRUSH OR WASH;  Surgeon: Luanne Bras, MD;  Location: [facility];  Service: Gastroenterology    PR COLONOSCOPY FLX DX W/COLLJ SPEC WHEN PFRMD N/A 02/05/2023    Procedure: COLONOSCOPY, FLEXIBLE, PROXIMAL TO SPLENIC FLEXURE; DIAGNOSTIC, W/WO COLLECTION SPECIMEN BY BRUSH OR WASH;  Surgeon: Luanne Bras, MD;  Location: [facility]ED[facility];  Service: Gastroenterology    PR REMV PILONIDAL LESION SIMPLE N/A 05/01/2021    Procedure: EXCISION OF PILONIDAL CYST OR SINUS; SIMPLE;  Surgeon: Sharyn Lull, MD;  Location: MAIN OR [place];  Service: Gastrointestinal    PR SURG DIAGNOSTIC EXAM, ANORECTAL N/A 05/01/2021    Procedure: ANORECTAL EXAM, SURGICAL, REQUIRING ANESTHESIA (GENERAL, SPINAL, OR EPIDURAL), DIAGNOSTIC;  Surgeon: Sharyn Lull, MD;  Location: MAIN OR [place];  Service: Gastrointestinal    TONSILLECTOMY         No family history on file.    Financial Information   Primary Insurance: Payor: Advertising copywriter MEDICARE ADV / Plan: UNITED HEALTHCARE DUAL COMPLETE HMO / Product Type: *No Product type* /    Secondary Insurance: Secondary Insurance  MEDICAID [place]   Prescription Coverage: Nurse, learning disability (listed above)   Preferred Pharmacy: [facility]  [facility]  [id number] - [place], [place] - [address]  [id number] - [place], [place] - [address] AT [address]    Barriers to taking medication: No    Transition Home   Transportation at time of discharge: Family/Friend's Private Vehicle    Anticipated changes related to Illness:  TBD   Services in place prior to admission: N/A   Services anticipated for DC:  TBD   Hemodialysis Prior to Admission: No    Readmission  Risk of Unplanned Readmission Score: UNPLANNED READMISSION SCORE: 15.74%  Readmitted Within the Last 30 Days?   Readmission Factors include: current reason for admission unrelated to previous admission    Social Determinants of Health  Social Drivers of Health     Food Insecurity: No Food Insecurity (02/15/2023)    Hunger Vital Sign     Worried About Running Out of Food in the Last Year: Never true     Ran Out of Food in the Last Year: Never true   Internet Connectivity: Not on file   Housing/Utilities: Low Risk  (02/15/2023)    Housing/Utilities     Within the past 12 months, have you ever stayed: outside, in a car, in a tent, in an overnight shelter, or temporarily in someone else's home (i.e. couch-surfing)?: No     Are you worried about losing your housing?: No     Within the past 12 months, have you been unable to get utilities (heat, electricity) when it was really needed?: No   Tobacco Use: High Risk (02/05/2023)    Patient History     Smoking Tobacco Use: Every Day     Smokeless Tobacco Use: Never     Passive Exposure: Not on file   Transportation Needs: No Transportation Needs (02/15/2023)    PRAPARE - Transportation     Lack of Transportation (Medical): No     Lack of Transportation (Non-Medical): No   Alcohol Use: Not on file   Interpersonal Safety: Not on file   Physical Activity: Inactive (08/28/2021)    Received from [facility], [facility]    Exercise Vital Sign  Days of Exercise per Week: 0 days     Minutes of Exercise per Session: 0 min   Intimate Partner Violence: Not At Risk (08/28/2021)    Received from [facility], [facility]    Humiliation, Afraid, Rape, and Kick questionnaire     Fear of Current or Ex-Partner: No     Emotionally Abused: No     Physically Abused: No     Sexually Abused: No   Stress: No Stress Concern Present (08/28/2021)    Received from [facility], [facility]    [facility] of Occupational Health - Occupational Stress Questionnaire     Feeling of Stress : Not at all   Substance Use: Not on file (11/18/2022)   Social Connections: Moderately Integrated (08/28/2021)    Received from [facility], [facility]    Social Connection and Isolation Panel [NHANES]     Frequency of Communication with Friends and Family: More than three times a week     Frequency of Social Gatherings with Friends and Family: More than three times a week     Attends Religious Services: More than 4 times per year     Active Member of Clubs or Organizations: No     Attends Engineer, structural: More than 4 times per year     Marital Status: Never married   Physicist, medical Strain: Low Risk  (02/15/2023)    Overall Financial Resource Strain (CARDIA)     Difficulty of Paying Living Expenses: Not very hard   Depression: Not on file   Health Literacy: Not on file       Social History  Support Systems/Concerns: Concern for Caregiver Burnout, Patient Teacher, music Service: No Conservator, museum/gallery and Psychiatric History  Psychosocial Stressors: Coping with health challenges/recent hospitalization, Lack of Caregivers / Caregiver burn out      Psychological Issues/Information: Mental illness   Mental Illness Concerns: Patient history of mental illness          Chemical Dependency: Illicit drugs              Outpatient Providers: Primary Care Provider   Name / Contact #: : Hezzie Bump, PA-C (Sep. 05, 2022September 05, 2022 - Present)  NPI: [id number]  [phone] (Work)  [phone] Engineer, production)  Physician Assistant  Legal: No legal issues      Ability to [employer]: Unfamiliar with options/procedures for obtaining

## 2023-02-15 NOTE — Unmapped (Signed)
Pt following care plan without incident. Pt reports positive pain control. Pt seen by chronic pain this shift. Pt ordered a ketamine drip and a pca. Pt continues to request prn iv dilaudid. Pt is alert and oriented x4. Pt was educated on home surgical site care.     Problem: Wound  Goal: Optimal Coping  Outcome: Ongoing - Unchanged  Goal: Optimal Functional Ability  Outcome: Ongoing - Unchanged  Goal: Absence of Infection Signs and Symptoms  Outcome: Ongoing - Unchanged  Goal: Improved Oral Intake  Outcome: Ongoing - Unchanged  Goal: Optimal Pain Control and Function  Outcome: Ongoing - Unchanged  Goal: Skin Health and Integrity  Outcome: Ongoing - Unchanged  Intervention: Optimize Skin Protection  Recent Flowsheet Documentation  Taken 02/15/2023 0745 by Patrcia Dolly, RN  Pressure Reduction Techniques: frequent weight shift encouraged  Pressure Reduction Devices: pressure-redistributing mattress utilized  Goal: Optimal Wound Healing  Outcome: Ongoing - Unchanged     Problem: Adult Inpatient Plan of Care  Goal: Plan of Care Review  Outcome: Ongoing - Unchanged  Goal: Patient-Specific Goal (Individualized)  Outcome: Ongoing - Unchanged  Goal: Absence of Hospital-Acquired Illness or Injury  Outcome: Ongoing - Unchanged  Intervention: Identify and Manage Fall Risk  Recent Flowsheet Documentation  Taken 02/15/2023 0745 by Patrcia Dolly, RN  Safety Interventions:   low bed   fall reduction program maintained   nonskid shoes/slippers when out of bed  Intervention: Prevent Skin Injury  Recent Flowsheet Documentation  Taken 02/15/2023 0745 by Patrcia Dolly, RN  Positioning for Skin: Supine/Back  Device Skin Pressure Protection: absorbent pad utilized/changed  Intervention: Prevent and Manage VTE (Venous Thromboembolism) Risk  Recent Flowsheet Documentation  Taken 02/15/2023 0745 by Patrcia Dolly, RN  Anti-Embolism Device Type: SCD, Knee  Anti-Embolism Device Status: On  Anti-Embolism Device Location: BLE  Goal: Optimal Comfort and Wellbeing  Outcome: Ongoing - Unchanged  Goal: Readiness for Transition of Care  Outcome: Ongoing - Unchanged  Goal: Rounds/Family Conference  Outcome: Ongoing - Unchanged     Problem: Pain Acute  Goal: Optimal Pain Control and Function  Outcome: Ongoing - Unchanged     Problem: Fall Injury Risk  Goal: Absence of Fall and Fall-Related Injury  Outcome: Ongoing - Unchanged  Intervention: Promote Injury-Free Environment  Recent Flowsheet Documentation  Taken 02/15/2023 0745 by Patrcia Dolly, RN  Safety Interventions:   low bed   fall reduction program maintained   nonskid shoes/slippers when out of bed     Problem: Malnutrition  Goal: Improved Nutritional Intake  Outcome: Ongoing - Unchanged

## 2023-02-16 LAB — HEPATIC FUNCTION PANEL
ALBUMIN: 2.8 g/dL — ABNORMAL LOW (ref 3.4–5.0)
ALKALINE PHOSPHATASE: 61 U/L (ref 46–116)
ALT (SGPT): 15 U/L (ref 10–49)
AST (SGOT): 15 U/L (ref <=34)
BILIRUBIN DIRECT: 0.2 mg/dL (ref 0.00–0.30)
BILIRUBIN TOTAL: 0.5 mg/dL (ref 0.3–1.2)
PROTEIN TOTAL: 5.3 g/dL — ABNORMAL LOW (ref 5.7–8.2)

## 2023-02-16 LAB — BASIC METABOLIC PANEL
ANION GAP: 5 mmol/L (ref 5–14)
BLOOD UREA NITROGEN: <5 mg/dL — ABNORMAL LOW (ref 9–23)
CALCIUM: 8.5 mg/dL — ABNORMAL LOW (ref 8.7–10.4)
CHLORIDE: 103 mmol/L (ref 98–107)
CO2: 32 mmol/L — ABNORMAL HIGH (ref 20.0–31.0)
CREATININE: 0.71 mg/dL — ABNORMAL LOW (ref 0.73–1.18)
EGFR CKD-EPI (2021) MALE: >90 mL/min/{1.73_m2} (ref >=60)
GLUCOSE RANDOM: 94 mg/dL (ref 70–179)
POTASSIUM: 4 mmol/L (ref 3.4–4.8)
SODIUM: 140 mmol/L (ref 135–145)

## 2023-02-16 LAB — CBC
HEMATOCRIT: 24.6 % — ABNORMAL LOW (ref 39.0–48.0)
HEMOGLOBIN: 8.2 g/dL — ABNORMAL LOW (ref 12.9–16.5)
MEAN CORPUSCULAR HEMOGLOBIN CONC: 33.2 g/dL (ref 32.0–36.0)
MEAN CORPUSCULAR HEMOGLOBIN: 30.7 pg (ref 25.9–32.4)
MEAN CORPUSCULAR VOLUME: 92.2 fL (ref 77.6–95.7)
MEAN PLATELET VOLUME: 9.6 fL (ref 6.8–10.7)
PLATELET COUNT: 183 10*9/L (ref 150–450)
RED BLOOD CELL COUNT: 2.67 10*12/L — ABNORMAL LOW (ref 4.26–5.60)
RED CELL DISTRIBUTION WIDTH: 13.3 % (ref 12.2–15.2)
WBC ADJUSTED: 8.3 10*9/L (ref 3.6–11.2)

## 2023-02-16 LAB — PHOSPHORUS: PHOSPHORUS: 2.7 mg/dL (ref 2.4–5.1)

## 2023-02-16 LAB — MAGNESIUM: MAGNESIUM: 1.6 mg/dL (ref 1.6–2.6)

## 2023-02-16 MED ADMIN — HYDROmorphone (DILAUDID) 50mg/50ml (1mg/ml) PCA CADD: INTRAVENOUS | @ 08:00:00 | Stop: 2023-02-16

## 2023-02-16 MED ADMIN — HYDROmorphone (DILAUDID) injection 2 mg: 2 mg | INTRAVENOUS | @ 16:00:00 | Stop: 2023-02-28

## 2023-02-16 MED ADMIN — acetaminophen (TYLENOL) tablet 1,000 mg: 1000 mg | ORAL | @ 07:00:00

## 2023-02-16 MED ADMIN — acetaminophen (TYLENOL) tablet 1,000 mg: 1000 mg | ORAL

## 2023-02-16 MED ADMIN — HYDROmorphone (DILAUDID) injection 2 mg: 2 mg | INTRAVENOUS | @ 11:00:00 | Stop: 2023-02-28

## 2023-02-16 MED ADMIN — HYDROmorphone (DILAUDID) injection 2 mg: 2 mg | INTRAVENOUS | @ 23:00:00 | Stop: 2023-02-28

## 2023-02-16 MED ADMIN — HYDROmorphone (DILAUDID) injection 2 mg: 2 mg | INTRAVENOUS | @ 07:00:00 | Stop: 2023-02-16

## 2023-02-16 MED ADMIN — oxyCODONE (ROXICODONE) immediate release tablet 10 mg: 10 mg | ORAL | @ 14:00:00 | Stop: 2023-02-16

## 2023-02-16 MED ADMIN — oxyCODONE (ROXICODONE) immediate release tablet 10 mg: 10 mg | ORAL | @ 02:00:00 | Stop: 2023-02-25

## 2023-02-16 MED ADMIN — nicotine (NICODERM CQ) 21 mg/24 hr patch 1 patch: 1 | TRANSDERMAL | @ 14:00:00

## 2023-02-16 MED ADMIN — buprenorphine-naloxone (SUBOXONE) 2-0.5 mg SL film 2 mg of buprenorphine: 1 | SUBLINGUAL | @ 14:00:00 | Stop: 2023-02-16

## 2023-02-16 MED ADMIN — enoxaparin (LOVENOX) syringe 40 mg: 40 mg | SUBCUTANEOUS | @ 14:00:00

## 2023-02-16 MED ADMIN — divalproex ER (DEPAKOTE ER) extended released 24 hr tablet 1,000 mg: 1000 mg | ORAL | @ 02:00:00

## 2023-02-16 MED ADMIN — acetaminophen (TYLENOL) tablet 1,000 mg: 1000 mg | ORAL | @ 17:00:00

## 2023-02-16 MED ADMIN — acetaminophen (TYLENOL) tablet 1,000 mg: 1000 mg | ORAL | @ 11:00:00

## 2023-02-16 MED ADMIN — HYDROmorphone (DILAUDID) injection 2 mg: 2 mg | INTRAVENOUS | @ 20:00:00 | Stop: 2023-02-28

## 2023-02-16 MED ADMIN — HYDROmorphone (DILAUDID) injection 2 mg: 2 mg | INTRAVENOUS | @ 04:00:00 | Stop: 2023-02-28

## 2023-02-16 MED ADMIN — diazePAM (VALIUM) tablet 5 mg: 5 mg | ORAL | @ 02:00:00

## 2023-02-16 MED ADMIN — oxyCODONE (ROXICODONE) immediate release tablet 15 mg: 15 mg | ORAL | @ 22:00:00 | Stop: 2023-02-25

## 2023-02-16 MED ADMIN — buprenorphine-naloxone (SUBOXONE) 2-0.5 mg SL film 2 mg of buprenorphine: 1 | SUBLINGUAL | @ 02:00:00

## 2023-02-16 MED ADMIN — diazePAM (VALIUM) tablet 5 mg: 5 mg | ORAL | @ 17:00:00

## 2023-02-16 MED ADMIN — lamoTRIgine (LaMICtal) tablet 200 mg: 200 mg | ORAL | @ 14:00:00

## 2023-02-16 MED ADMIN — oxyCODONE (ROXICODONE) immediate release tablet 15 mg: 15 mg | ORAL | @ 18:00:00 | Stop: 2023-02-25

## 2023-02-16 MED ADMIN — buprenorphine-naloxone (SUBOXONE) 2-0.5 mg SL film 2 mg of buprenorphine: 1 | SUBLINGUAL | @ 23:00:00

## 2023-02-16 MED ADMIN — ketamine 500 mg in sodium chloride 0.9 % 250 mL (2 mg/mL): .15 mg/kg/h | INTRAVENOUS | @ 12:00:00 | Stop: 2023-02-22

## 2023-02-16 NOTE — Unmapped (Signed)
Colorectal Surgery Progress Note    Today's Date: 02/16/2023  Admission Date: 02/14/2023  Length of Stay: Day 2, 2 Days Post-Op  Hospital Service: Henreitta Leber [facility])  Attending Physician: Sharyn Lull, MD     Assessment   Principal Problem:    Crohn's disease of colon with complication (CMS-HCC)  Active Problems:    Tobacco use disorder    Dakota Townsend is a 43 y.o. male with a medical history significant for Crohn's with several previous ileocolic resections. The patient is s/p laparoscopic partial colectomy with removal of terminal ileum with ileocolostomy on 2/6.    Subjective   Continues to complain of significant pain but overall improved from yesterday. No passage of flatus but hungry. Would like to ambulate today in the halls.   Plan   Neuro/Psych:  #Pain control  - SCH: PO tylenol 1000q6, home buprenorphine  - PRN: dPCA (0.3q6, max 12), IV dilaudid 1q3 (breakthrough only), [id number]  - Ketamine gtt (chronic pain managing)     CV/Pulm:  - HDS, oxygenating well on RA    FEN/GI:  F: ML  E: Replete PRN  N: Regular diet + protein supplements   - Strict I/O's    Renal/GU:  - TOV today     Heme/ID:  - Afebrile, WBC WNL  - Hgb 8.2 today, continue to monitor     Prophylaxis: prophylactic Lovenox, SCDs, out of bed/ambulate TID, IS, PPI  - Patient/family learning Lovenox injections for home  - Plan to continue Lovenox for 28 days from surgery in setting of inflammatory bowel disease history and the increased VTE risk    Access: peripheral IV    Dispo: floor status    Objective   Vital signs over the last 24 hours:  Temp:  [36.6 ??C (97.9 ??F)-37.1 ??C (98.8 ??F)] 36.6 ??C (97.9 ??F)  Heart Rate:  [67-104] 93  Resp:  [16-18] 18  BP: (106-127)/(65-75) 112/66  MAP (mmHg):  [77-85] 79  SpO2:  [93 %-98 %] 93 %    Wt Readings from Last 5 Encounters:   02/14/23 68.4 kg (150 lb 12.7 oz)   02/11/23 68.2 kg (150 lb 5.7 oz)   02/05/23 72.6 kg (160 lb)   01/29/23 72.6 kg (160 lb)   01/24/23 71.7 kg (158 lb)      Physical Exam:  Gen: alert, in mild distress, anxious  CV: regular rate, normotensive  Pulm: breathing comfortably on room air  Abd: soft, nondistended, and appropriately tender to palpation. Surgical incisions approximated with Dermabond, clean and dry.   Ext: warm and well perfused bilaterally, no edema

## 2023-02-16 NOTE — Unmapped (Signed)
Pt alert and oriented*4. Vital signs stable. His pain is not managed even with with ketamine drip, PCA and PRN's, on call provider notified. No complained of nausea and vomiting. Diet tolerated well. Foley removed this morning, TOV progressing. Surgical site CDI. Safety measures in place. Will continue to monitor.  Problem: Wound  Goal: Optimal Coping  Outcome: Progressing  Goal: Optimal Functional Ability  Outcome: Progressing  Goal: Absence of Infection Signs and Symptoms  Outcome: Progressing  Goal: Improved Oral Intake  Outcome: Progressing  Goal: Optimal Pain Control and Function  Outcome: Progressing  Goal: Skin Health and Integrity  Outcome: Progressing  Goal: Optimal Wound Healing  Outcome: Progressing     Problem: Adult Inpatient Plan of Care  Goal: Plan of Care Review  Outcome: Progressing  Goal: Patient-Specific Goal (Individualized)  Outcome: Progressing  Goal: Absence of Hospital-Acquired Illness or Injury  Outcome: Progressing  Goal: Optimal Comfort and Wellbeing  Outcome: Progressing  Goal: Readiness for Transition of Care  Outcome: Progressing  Goal: Rounds/Family Conference  Outcome: Progressing     Problem: Pain Acute  Goal: Optimal Pain Control and Function  Outcome: Progressing     Problem: Fall Injury Risk  Goal: Absence of Fall and Fall-Related Injury  Outcome: Progressing     Problem: Malnutrition  Goal: Improved Nutritional Intake  Outcome: Progressing

## 2023-02-16 NOTE — Unmapped (Signed)
Department of Anesthesiology  Pain Medicine Division    Chronic Pain Follow Up Inpatient Consult Note    Requesting Attending Physician:  Sharyn Lull, MD  Service Requesting Consult:  Henreitta Le[facility])    The patient is a 43 y.o. male w/PMH Crohn's disease s/p previous ileocolic resections, pneumonia, admitted on 02/14/23, now POD#2 s/p laparoscopic partial colectomy with removal of terminal ileum and ileocolostomy on 02/14/23.     We were consulted for complex pain management.    Home Analgesic Medications:  Suboxone 8-2 mg SL BID  OTC APAP 1g PO q4h PRN    Interval Events: None    PRN Medication Requirements: IV HM x7, PO oxycodone x3    Vital Sign Trends: Vitally stable, afebrile.    Labs: No notable derangements (creatinine, LFTs for ketamine infusion, glucose levels)    -The chronic pain service is a consult service and does not place orders, just makes recommendations (except ketamine and lidocaine infusions)   -Please evaluate all patients on opioids for appropriateness of prescribing narcan at discharge.  The chronic pain service can assist with this.  Nasal narcan is covered by most insurances.  -Recommendations given apply to the current hospitalization and do not reflect long term recommendations.    -Continue APAP 1g PO q8h [facility]  -Increase Suboxone 2-0.5 mg SL TID  -Wean Hydromorphone PCA to 0.3mg /q10 min  -Continue hydromorphone 1-2 mg IV q3h PRN  -Continue oxycodone 5-10 mg PO q3h PRN     -Patient is on a low dose ketamine infusion for pain              -Ketamine gtt #1 of 4 maximum over a 12 month period               -All ketamine orders will be managed by the [facility] Chronic Pain Service only-orders can only be placed by attendings and fellows              -Ketamine requires dedicated IV (PIV or central line lumen)              -Increase ketamine infusion: 0.15mg /kg/hr Actual body weight (BMI <40-Actual BW, BMI >40-IBW)              -Patient location: Step down: dose can increase every 4 hours (can decrease anytime)              -Day 1 (max-7 days). Started (date): 02/15/23              -Prior to starting: Recent EKG, CMP (creatinine, LFTs), urine pregnancy test (if applicable)              -Daily CMP (creatinine and LFTs daily) while running              -Please contact the Chronic Pain service with any questions or concerns about ketamine infusion.    Home MME: N/A - on Suboxone  Current MME: 919.5    We will continue to follow.    Naloxone Rx at discharge?  Is patient on opioids? Yes.  1)Is dose >50MME?  Yes.  2) Is patient prescribed a benzodiazepine (w opioids)? No.  3)Hx of overdose?  No.  4) Hx of substance use disorder? Yes.  5) Opioids likely to last greater than a week after discharge? Yes.    If yes to 2 or more, prescribe naloxone at discharge.  Nasal narcan for most insured (Nasal narcan 4mg /actuation, prescribe 1 kit, instructions at [email or web address]).  For uninsured, chronic pain can work to assist in finding an option.  OTC nasal narcan now available at most pharmacies for around $45.    Interim History  There were no Acute Events Overnight.  The patient is obtaining adequate pain relief on current medication regimen and feels that their pain is Somewhat controlled    Today patient reports pain is better-controlled since starting the hydromorphone PCA and the ketamine infusion. No nausea or vomiting. Has not had a bowel movement yet. No notable S/E from pain medications. No new complaints or concerns w/r/t pain.    Analgesia Evaluation:  Pain at minimum: 5/10  Pain at maximum: 10/10    Inpatient Medications  Current Facility-Administered Medications   Medication Dose Route Frequency Provider Last Rate Last Admin      EXPAREL ADMINISTERED WITHIN 96 HRS - NO BUPIVACAINE FOR 96 HOURS AFTER EXPAREL  1 each Other Continuous Emilio Aspen, MD   1 each at 02/14/23 1221    acetaminophen (TYLENOL) tablet 1,000 mg  1,000 mg Oral Q6H SCH Emilio Aspen, MD   1,000 mg at 02/16/23 0627    albuterol (PROVENTIL HFA;VENTOLIN HFA) 90 mcg/actuation inhaler 2 puff  2 puff Inhalation Q4H PRN Emilio Aspen, MD        buprenorphine-naloxone (SUBOXONE) 2-0.5 mg SL film 2 mg of buprenorphine  1 Film Sublingual BID Rookard, Alaina A, PA   2 mg of buprenorphine at 02/16/23 0854    calcium carbonate (TUMS) chewable tablet 400 mg elem calcium  400 mg elem calcium Oral TID PRN Rookard, Alaina A, PA   400 mg elem calcium at 02/15/23 1413    diazePAM (VALIUM) tablet 5 mg  5 mg Oral Q6H PRN Emilio Aspen, MD   5 mg at 02/15/23 2100    divalproex ER (DEPAKOTE ER) extended released 24 hr tablet 1,000 mg  1,000 mg Oral Nightly Lorre Nick, Edsel Petrin, MD/DMD   1,000 mg at 02/15/23 2056    enoxaparin (LOVENOX) syringe 40 mg  40 mg Subcutaneous Q24H SCH Emilio Aspen, MD   40 mg at 02/16/23 4540    HYDROmorphone (DILAUDID) 50mg /85ml (1mg /ml) PCA CADD   Intravenous Continuous Rookard, Alaina A, PA   New Syringe/Cartridge at 02/16/23 0235    HYDROmorphone (PF) (DILAUDID) injection 1 mg  1 mg Intravenous Q3H PRN Zappas, Barbie Haggis, MD        Or    HYDROmorphone (DILAUDID) injection 2 mg  2 mg Intravenous Q3H PRN Zappas, Barbie Haggis, MD   2 mg at 02/16/23 9811    ketamine 500 mg in sodium chloride 0.9 % 250 mL (2 mg/mL)  0.15 mg/kg/hr Intravenous Continuous Theodis Sato, DO 3.42 mL/hr at 02/16/23 0717 0.1 mg/kg/hr at 02/16/23 9147    lactated Ringers infusion  10 mL/hr Intravenous Continuous Emilio Aspen, MD        lamoTRIgine (LaMICtal) tablet 200 mg  200 mg Oral Daily Emilio Aspen, MD   200 mg at 02/16/23 8295    nicotine (NICODERM CQ) 21 mg/24 hr patch 1 patch  1 patch Transdermal Daily Rookard, Alaina A, PA   1 patch at 02/16/23 0854    nicotine polacrilex (NICORETTE) gum 4 mg  4 mg Buccal Q1H PRN Nicholaus Corolla I, MD        nicotine polacrilex (NICORETTE) lozenge 4 mg  4 mg Buccal Q1H PRN Nicholaus Corolla I, MD        ondansetron (ZOFRAN-ODT) disintegrating tablet 4 mg  4 mg Oral Q6H PRN Emilio Aspen, MD Or    ondansetron [facility]) injection 4 mg  4 mg Intravenous Q6H PRN Emilio Aspen, MD   4 mg at 02/14/23 2259    oxyCODONE (ROXICODONE) immediate release tablet 10 mg  10 mg Oral Q3H PRN Zappas, Barbie Haggis, MD        Or    oxyCODONE (ROXICODONE) immediate release tablet 15 mg  15 mg Oral Q3H PRN Zappas, Barbie Haggis, MD        sodium chloride (NS) 0.9 % infusion  10 mL/hr Intravenous Continuous Cloyde Reams D, DO 10 mL/hr at 02/15/23 1112 10 mL/hr at 02/15/23 1112     Objective:     Vital Signs    Temp:  [36.6 ??C (97.9 ??F)-37.1 ??C (98.8 ??F)] 36.6 ??C (97.9 ??F)  Heart Rate:  [67-104] 93  Resp:  [16-18] 18  BP: (106-127)/(65-75) 112/66  MAP (mmHg):  [77-85] 79  SpO2:  [93 %-98 %] 93 %    Physical Exam  GENERAL:  Well developed, well-nourished male and is in mild distress.   HEAD/NECK:    Reveals normocephalic/atraumatic.   CARDIOVASCULAR:   Warm, well perfused  LUNGS:   Normal work of breathing, no supplemental 02  EXTREMITIES:  Warm, no clubbing, cyanosis, or edema was noted.  NEUROLOGIC:    The patient was alert and oriented times four with normal language, attention, cognition and memory. MAEW. Cranial nerve exam was grossly normal.   MUSCULOSKELETAL:    No deformities.  SKIN:  No obvious rashes lesions or erythema  PSY:  Appropriate affect and mood.    Test Results    Lab Results   Component Value Date    CREATININE 0.71 (L) 02/16/2023     Lab Results   Component Value Date    ALKPHOS 61 02/16/2023    BILITOT 0.5 02/16/2023    BILIDIR 0.20 02/16/2023    PROT 5.3 (L) 02/16/2023    ALBUMIN 2.8 (L) 02/16/2023    ALT 15 02/16/2023    AST 15 02/16/2023     Problem List    Principal Problem:    Crohn's disease of colon with complication (CMS-HCC)  Active Problems:    Tobacco use disorder    Cloyde Reams, DO  Pain Medicine Fellow  Department of Anesthesiology  [place] [facility] [place]

## 2023-02-16 NOTE — Unmapped (Signed)
Patient is A&OX4. VSS. Surgical site intact. Continuous Ketamine drip, PCA Dilaudid and PRN Oxycodone and IV Dilaudid for pain control. Foley catheter in place, urine output recorded. No other issue noted. Care plan reviewed    Problem: Wound  Goal: Optimal Coping  Outcome: Progressing  Goal: Optimal Functional Ability  Outcome: Progressing  Goal: Absence of Infection Signs and Symptoms  Outcome: Progressing  Goal: Improved Oral Intake  Outcome: Progressing  Goal: Optimal Pain Control and Function  Outcome: Progressing  Goal: Skin Health and Integrity  Outcome: Progressing  Goal: Optimal Wound Healing  Outcome: Progressing     Problem: Adult Inpatient Plan of Care  Goal: Plan of Care Review  Outcome: Progressing  Goal: Patient-Specific Goal (Individualized)  Outcome: Progressing  Goal: Absence of Hospital-Acquired Illness or Injury  Outcome: Progressing  Goal: Optimal Comfort and Wellbeing  Outcome: Progressing  Goal: Readiness for Transition of Care  Outcome: Progressing  Goal: Rounds/Family Conference  Outcome: Progressing     Problem: Pain Acute  Goal: Optimal Pain Control and Function  Outcome: Progressing     Problem: Fall Injury Risk  Goal: Absence of Fall and Fall-Related Injury  Outcome: Progressing     Problem: Malnutrition  Goal: Improved Nutritional Intake  Outcome: Progressing

## 2023-02-16 NOTE — Unmapped (Signed)
Unit nurse was able to insert the PIV.    Thanks you,    Melba Coon

## 2023-02-17 LAB — BASIC METABOLIC PANEL
ANION GAP: 9 mmol/L (ref 5–14)
BLOOD UREA NITROGEN: 5 mg/dL — ABNORMAL LOW (ref 9–23)
BUN / CREAT RATIO: 7
CALCIUM: 7.9 mg/dL — ABNORMAL LOW (ref 8.7–10.4)
CHLORIDE: 101 mmol/L (ref 98–107)
CO2: 30 mmol/L (ref 20.0–31.0)
CREATININE: 0.7 mg/dL — ABNORMAL LOW (ref 0.73–1.18)
EGFR CKD-EPI (2021) MALE: >90 mL/min/{1.73_m2} (ref >=60)
GLUCOSE RANDOM: 100 mg/dL (ref 70–179)
POTASSIUM: 3.6 mmol/L (ref 3.4–4.8)
SODIUM: 140 mmol/L (ref 135–145)

## 2023-02-17 LAB — CBC
HEMATOCRIT: 22.2 % — ABNORMAL LOW (ref 39.0–48.0)
HEMOGLOBIN: 7.6 g/dL — ABNORMAL LOW (ref 12.9–16.5)
MEAN CORPUSCULAR HEMOGLOBIN CONC: 34.4 g/dL (ref 32.0–36.0)
MEAN CORPUSCULAR HEMOGLOBIN: 31.6 pg (ref 25.9–32.4)
MEAN CORPUSCULAR VOLUME: 91.8 fL (ref 77.6–95.7)
MEAN PLATELET VOLUME: 10 fL (ref 6.8–10.7)
PLATELET COUNT: 180 10*9/L (ref 150–450)
RED BLOOD CELL COUNT: 2.42 10*12/L — ABNORMAL LOW (ref 4.26–5.60)
RED CELL DISTRIBUTION WIDTH: 13.4 % (ref 12.2–15.2)
WBC ADJUSTED: 7.3 10*9/L (ref 3.6–11.2)

## 2023-02-17 LAB — HEPATIC FUNCTION PANEL
ALBUMIN: 2.8 g/dL — ABNORMAL LOW (ref 3.4–5.0)
ALKALINE PHOSPHATASE: 61 U/L (ref 46–116)
ALT (SGPT): 11 U/L (ref 10–49)
AST (SGOT): 12 U/L (ref <=34)
BILIRUBIN DIRECT: <0.1 mg/dL (ref 0.00–0.30)
BILIRUBIN TOTAL: 0.2 mg/dL — ABNORMAL LOW (ref 0.3–1.2)
PROTEIN TOTAL: 5.3 g/dL — ABNORMAL LOW (ref 5.7–8.2)

## 2023-02-17 LAB — C-REACTIVE PROTEIN: C-REACTIVE PROTEIN: 105 mg/L — ABNORMAL HIGH (ref <=10.0)

## 2023-02-17 LAB — PHOSPHORUS: PHOSPHORUS: 4.1 mg/dL (ref 2.4–5.1)

## 2023-02-17 LAB — MAGNESIUM: MAGNESIUM: 1.5 mg/dL — ABNORMAL LOW (ref 1.6–2.6)

## 2023-02-17 MED ADMIN — enoxaparin (LOVENOX) syringe 40 mg: 40 mg | SUBCUTANEOUS | @ 14:00:00

## 2023-02-17 MED ADMIN — oxyCODONE (ROXICODONE) immediate release tablet 15 mg: 15 mg | ORAL | @ 03:00:00 | Stop: 2023-02-25

## 2023-02-17 MED ADMIN — HYDROmorphone (DILAUDID) injection 2 mg: 2 mg | INTRAVENOUS | @ 01:00:00 | Stop: 2023-02-28

## 2023-02-17 MED ADMIN — oxyCODONE (ROXICODONE) immediate release tablet 15 mg: 15 mg | ORAL | @ 12:00:00 | Stop: 2023-02-25

## 2023-02-17 MED ADMIN — buprenorphine-naloxone (SUBOXONE) 2-0.5 mg SL film 2 mg of buprenorphine: 1 | SUBLINGUAL | @ 17:00:00

## 2023-02-17 MED ADMIN — HYDROmorphone (DILAUDID) injection 2 mg: 2 mg | INTRAVENOUS | @ 05:00:00 | Stop: 2023-02-28

## 2023-02-17 MED ADMIN — acetaminophen (TYLENOL) tablet 1,000 mg: 1000 mg | ORAL | @ 10:00:00

## 2023-02-17 MED ADMIN — oxyCODONE (ROXICODONE) immediate release tablet 15 mg: 15 mg | ORAL | @ 23:00:00 | Stop: 2023-02-25

## 2023-02-17 MED ADMIN — buprenorphine-naloxone (SUBOXONE) 2-0.5 mg SL film 2 mg of buprenorphine: 1 | SUBLINGUAL | @ 10:00:00

## 2023-02-17 MED ADMIN — HYDROmorphone (DILAUDID) injection 2 mg: 2 mg | INTRAVENOUS | @ 14:00:00 | Stop: 2023-02-28

## 2023-02-17 MED ADMIN — gabapentin (NEURONTIN) capsule 300 mg: 300 mg | ORAL | @ 19:00:00

## 2023-02-17 MED ADMIN — buprenorphine-naloxone (SUBOXONE) 2-0.5 mg SL film 2 mg of buprenorphine: 1 | SUBLINGUAL | @ 23:00:00

## 2023-02-17 MED ADMIN — divalproex ER (DEPAKOTE ER) extended released 24 hr tablet 1,000 mg: 1000 mg | ORAL | @ 01:00:00

## 2023-02-17 MED ADMIN — gabapentin (NEURONTIN) capsule 300 mg: 300 mg | ORAL | @ 14:00:00

## 2023-02-17 MED ADMIN — acetaminophen (TYLENOL) tablet 1,000 mg: 1000 mg | ORAL | @ 17:00:00

## 2023-02-17 MED ADMIN — HYDROmorphone (DILAUDID) injection 2 mg: 2 mg | INTRAVENOUS | @ 19:00:00 | Stop: 2023-02-28

## 2023-02-17 MED ADMIN — HYDROmorphone (DILAUDID) 50mg/50ml (1mg/ml) PCA CADD: INTRAVENOUS | @ 11:00:00 | Stop: 2023-03-01

## 2023-02-17 MED ADMIN — ketamine 500 mg in sodium chloride 0.9 % 250 mL (2 mg/mL): .15 mg/kg/h | INTRAVENOUS | Stop: 2023-02-22

## 2023-02-17 MED ADMIN — lamoTRIgine (LaMICtal) tablet 200 mg: 200 mg | ORAL | @ 14:00:00

## 2023-02-17 MED ADMIN — HYDROmorphone (DILAUDID) injection 2 mg: 2 mg | INTRAVENOUS | @ 10:00:00 | Stop: 2023-02-28

## 2023-02-17 MED ADMIN — nicotine (NICODERM CQ) 21 mg/24 hr patch 1 patch: 1 | TRANSDERMAL | @ 14:00:00

## 2023-02-17 MED ADMIN — acetaminophen (TYLENOL) tablet 1,000 mg: 1000 mg | ORAL | @ 23:00:00

## 2023-02-17 MED ADMIN — diazePAM (VALIUM) tablet 5 mg: 5 mg | ORAL | @ 12:00:00

## 2023-02-17 MED ADMIN — diazePAM (VALIUM) tablet 5 mg: 5 mg | ORAL | @ 03:00:00

## 2023-02-17 NOTE — Unmapped (Signed)
Pt alert and oriented*4. Vital signs stable. ON ketamine drip and PCA, rate of ketamine drip increased , pt verbalize pain better controlled than yesterday. No complained of nausea and vomiting. Diet tolerated well. Surgical site CDI. Voiding adequate. Medications provided as per order. Safety measures in place. Will continue to monitor.   Problem: Wound  Goal: Optimal Coping  Outcome: Progressing  Goal: Optimal Functional Ability  Outcome: Progressing  Goal: Absence of Infection Signs and Symptoms  Outcome: Progressing  Goal: Improved Oral Intake  Outcome: Progressing  Goal: Optimal Pain Control and Function  Outcome: Progressing  Goal: Skin Health and Integrity  Outcome: Progressing  Goal: Optimal Wound Healing  Outcome: Progressing     Problem: Adult Inpatient Plan of Care  Goal: Plan of Care Review  Outcome: Progressing  Goal: Patient-Specific Goal (Individualized)  Outcome: Progressing  Goal: Absence of Hospital-Acquired Illness or Injury  Outcome: Progressing  Goal: Optimal Comfort and Wellbeing  Outcome: Progressing  Goal: Readiness for Transition of Care  Outcome: Progressing  Goal: Rounds/Family Conference  Outcome: Progressing     Problem: Pain Acute  Goal: Optimal Pain Control and Function  Outcome: Progressing     Problem: Fall Injury Risk  Goal: Absence of Fall and Fall-Related Injury  Outcome: Progressing     Problem: Malnutrition  Goal: Improved Nutritional Intake  Outcome: Progressing

## 2023-02-17 NOTE — Unmapped (Signed)
Problem: Wound  Goal: Optimal Coping  Outcome: Progressing  Goal: Optimal Functional Ability  Outcome: Progressing  Goal: Absence of Infection Signs and Symptoms  Outcome: Progressing  Goal: Improved Oral Intake  Outcome: Progressing  Goal: Optimal Pain Control and Function  Outcome: Progressing  Goal: Skin Health and Integrity  Outcome: Progressing  Goal: Optimal Wound Healing  Outcome: Progressing     Problem: Adult Inpatient Plan of Care  Goal: Plan of Care Review  Outcome: Progressing  Goal: Patient-Specific Goal (Individualized)  Outcome: Progressing  Goal: Absence of Hospital-Acquired Illness or Injury  Outcome: Progressing  Goal: Optimal Comfort and Wellbeing  Outcome: Progressing  Goal: Readiness for Transition of Care  Outcome: Progressing  Goal: Rounds/Family Conference  Outcome: Progressing     Problem: Pain Acute  Goal: Optimal Pain Control and Function  Outcome: Progressing     Problem: Fall Injury Risk  Goal: Absence of Fall and Fall-Related Injury  Outcome: Progressing     Problem: Malnutrition  Goal: Improved Nutritional Intake  Outcome: Progressing

## 2023-02-17 NOTE — Unmapped (Signed)
Department of Anesthesiology  Pain Medicine Division    Chronic Pain Follow Up Inpatient Consult Note    Requesting Attending Physician:  Sharyn Lull, MD  Service Requesting Consult:  Henreitta Leber [facility])    The patient is a 43 y.o. male w/PMH Crohn's disease s/p previous ileocolic resections, pneumonia, admitted on 02/14/23, now POD#2 s/p laparoscopic partial colectomy with removal of terminal ileum and ileocolostomy on 02/14/23.     We were consulted for complex pain management.    Home Analgesic Medications:  Suboxone 8-2 mg SL BID  OTC APAP 1g PO q4h PRN    Interval Events: None    PRN Medication Requirements: IV HM x6, PO oxycodone x3    Vital Sign Trends: Vitally stable, afebrile.    Labs: No notable derangements (creatinine, LFTs for ketamine infusion, glucose levels)    -The chronic pain service is a consult service and does not place orders, just makes recommendations (except ketamine and lidocaine infusions)   -Please evaluate all patients on opioids for appropriateness of prescribing narcan at discharge.  The chronic pain service can assist with this.  Nasal narcan is covered by most insurances.  -Recommendations given apply to the current hospitalization and do not reflect long term recommendations.    -Continue APAP 1g PO q8h [facility]  -Continue Suboxone 2-0.5 mg SL TID  -Continue Hydromorphone PCA 0.3mg /q10 min  -Continue hydromorphone 1-2 mg IV q3h PRN  -Continue oxycodone 5-10 mg PO q3h PRN     -Patient is on a low dose ketamine infusion for pain              -Ketamine gtt #1 of 4 maximum over a 12 month period               -All ketamine orders will be managed by the [facility] Chronic Pain Service only-orders can only be placed by attendings and fellows              -Ketamine requires dedicated IV (PIV or central line lumen)              -Increase ketamine infusion: 0.25 mg/kg/hr Actual body weight (BMI <40-Actual BW, BMI >40-IBW)              -Patient location: Step down: dose can increase every 4 hours (can decrease anytime)              -Day 2 (max-7 days). Started (date): 02/15/23              -Prior to starting: Recent EKG, CMP (creatinine, LFTs), urine pregnancy test (if applicable)              -Daily CMP (creatinine and LFTs daily) while running              -Please contact the Chronic Pain service with any questions or concerns about ketamine infusion.    Home MME: N/A - on Suboxone  Current MME: 919->675 today  PCA: 28.5 mg/24hr    We will continue to follow.    Naloxone Rx at discharge?  Is patient on opioids? Yes.  1)Is dose >50MME?  Yes.  2) Is patient prescribed a benzodiazepine (w opioids)? No.  3)Hx of overdose?  No.  4) Hx of substance use disorder? Yes.  5) Opioids likely to last greater than a week after discharge? Yes.    If yes to 2 or more, prescribe naloxone at discharge.  Nasal narcan for most insured (Nasal narcan 4mg /actuation, prescribe 1  kit, instructions at [email or web address]).  For uninsured, chronic pain can work to assist in finding an option.  OTC nasal narcan now available at most pharmacies for around $45.    Interim History  There were no Acute Events Overnight.  The patient is obtaining adequate pain relief on current medication regimen and feels that their pain is Poorly controlled    Today patient reports pain is worse compared to yesterday. The ketamine infusion was mildly increased. He is frustrated about the time it's taking to receive his PRN medications. No side effects from the ketamine or other pain medications.    Analgesia Evaluation:  Pain at minimum: 5/10  Pain at maximum: 10/10    Inpatient Medications  Current Facility-Administered Medications   Medication Dose Route Frequency Provider Last Rate Last Admin      EXPAREL ADMINISTERED WITHIN 96 HRS - NO BUPIVACAINE FOR 96 HOURS AFTER EXPAREL  1 each Other Continuous Emilio Aspen, MD   1 each at 02/14/23 1221    acetaminophen (TYLENOL) tablet 1,000 mg  1,000 mg Oral Q6H SCH Emilio Aspen, MD   1,000 mg at 02/17/23 0528    albuterol (PROVENTIL HFA;VENTOLIN HFA) 90 mcg/actuation inhaler 2 puff  2 puff Inhalation Q4H PRN Emilio Aspen, MD        buprenorphine-naloxone (SUBOXONE) 2-0.5 mg SL film 2 mg of buprenorphine  1 Film Sublingual TID Dawson Bills, MD   2 mg of buprenorphine at 02/17/23 0528    calcium carbonate (TUMS) chewable tablet 400 mg elem calcium  400 mg elem calcium Oral TID PRN Rookard, Alaina A, PA   400 mg elem calcium at 02/15/23 1413    diazePAM (VALIUM) tablet 5 mg  5 mg Oral Q6H PRN Emilio Aspen, MD   5 mg at 02/17/23 0658    divalproex ER (DEPAKOTE ER) extended released 24 hr tablet 1,000 mg  1,000 mg Oral Nightly Lorre Nick, Edsel Petrin, MD/DMD   1,000 mg at 02/16/23 2023    enoxaparin (LOVENOX) syringe 40 mg  40 mg Subcutaneous Q24H SCH Emilio Aspen, MD   40 mg at 02/17/23 8295    gabapentin (NEURONTIN) capsule 300 mg  300 mg Oral TID Billey Chang, MD   300 mg at 02/17/23 6213    HYDROmorphone (DILAUDID) 50mg /75ml (1mg /ml) PCA CADD   Intravenous Continuous Dawson Bills, MD   New Syringe/Cartridge at 02/17/23 0542    HYDROmorphone (PF) (DILAUDID) injection 1 mg  1 mg Intravenous Q3H PRN Zappas, Barbie Haggis, MD        Or    HYDROmorphone (DILAUDID) injection 2 mg  2 mg Intravenous Q3H PRN Zappas, Barbie Haggis, MD   2 mg at 02/17/23 0865    ketamine 500 mg in sodium chloride 0.9 % 250 mL (2 mg/mL)  0.25 mg/kg/hr Intravenous Continuous Karma Greaser, MD 5.13 mL/hr at 02/16/23 2300 0.15 mg/kg/hr at 02/16/23 2300    lactated Ringers infusion  10 mL/hr Intravenous Continuous Emilio Aspen, MD        lamoTRIgine (LaMICtal) tablet 200 mg  200 mg Oral Daily Emilio Aspen, MD   200 mg at 02/17/23 7846    nicotine (NICODERM CQ) 21 mg/24 hr patch 1 patch  1 patch Transdermal Daily Rookard, Alaina A, PA   1 patch at 02/17/23 0852    nicotine polacrilex (NICORETTE) gum 4 mg  4 mg Buccal Q1H PRN Ok Anis, MD        nicotine polacrilex (NICORETTE)  lozenge 4 mg  4 mg Buccal Q1H PRN Nicholaus Corolla I, MD        ondansetron (ZOFRAN-ODT) disintegrating tablet 4 mg  4 mg Oral Q6H PRN Emilio Aspen, MD        Or    ondansetron [facility]) injection 4 mg  4 mg Intravenous Q6H PRN Emilio Aspen, MD   4 mg at 02/14/23 2259    oxyCODONE (ROXICODONE) immediate release tablet 10 mg  10 mg Oral Q3H PRN Zappas, Barbie Haggis, MD        Or    oxyCODONE (ROXICODONE) immediate release tablet 15 mg  15 mg Oral Q3H PRN Zappas, Barbie Haggis, MD   15 mg at 02/17/23 0981    sodium chloride (NS) 0.9 % infusion  10 mL/hr Intravenous Continuous Cloyde Reams D, DO 10 mL/hr at 02/15/23 1112 10 mL/hr at 02/15/23 1112     Objective:     Vital Signs    Temp:  [36.5 ??C (97.7 ??F)-36.9 ??C (98.4 ??F)] 36.5 ??C (97.7 ??F)  Heart Rate:  [76-99] 82  Resp:  [16-18] 16  BP: (106-136)/(61-77) 136/77  MAP (mmHg):  [77-93] 93  SpO2:  [96 %-99 %] 96 %    Physical Exam  GENERAL:  Well developed, well-nourished male and is in mild distress.   HEAD/NECK:    Reveals normocephalic/atraumatic.   CARDIOVASCULAR:   Warm, well perfused  LUNGS:   Normal work of breathing, no supplemental 02  EXTREMITIES:  Warm, no clubbing, cyanosis, or edema was noted.  NEUROLOGIC:    The patient was alert and oriented times four with normal language, attention, cognition and memory. MAEW. Cranial nerve exam was grossly normal.   MUSCULOSKELETAL:    No deformities.  SKIN:  No obvious rashes lesions or erythema  PSY:  Appropriate affect and mood.    Test Results    Lab Results   Component Value Date    CREATININE 0.70 (L) 02/17/2023     Lab Results   Component Value Date    ALKPHOS 61 02/17/2023    BILITOT 0.2 (L) 02/17/2023    BILIDIR <0.10 02/17/2023    PROT 5.3 (L) 02/17/2023    ALBUMIN 2.8 (L) 02/17/2023    ALT 11 02/17/2023    AST 12 02/17/2023     Problem List    Principal Problem:    Crohn's disease of colon with complication (CMS-HCC)  Active Problems:    Tobacco use disorder    Cloyde Reams, DO  Pain Medicine Fellow  Department of Anesthesiology  [facility]

## 2023-02-17 NOTE — Unmapped (Signed)
Colorectal Surgery Progress Note    Today's Date: 02/17/2023  Admission Date: 02/14/2023  Length of Stay: Day 3, 3 Days Post-Op  Hospital Service: Henreitta Leber [facility])  Attending Physician: Sharyn Lull, MD     Assessment   Principal Problem:    Crohn's disease of colon with complication (CMS-HCC)  Active Problems:    Tobacco use disorder    Dakota Townsend is a 43 y.o. male with a medical history significant for Crohn's with several previous ileocolic resections. The patient is s/p laparoscopic partial colectomy with removal of terminal ileum with ileocolostomy on 2/6.    Subjective   Again complaining of significant pain. Ketamine increased yesterday with plan to increase again today. UOP adequate, passed TOV yesterday. Not ambulating 2/2 pain. Not passing flatus but no nausea.     Plan   Neuro/Psych:  #Pain control  - SCH: PO tylenol 1000q6, gabapentin 300 TID  home buprenorphine  - PRN: dPCA (0.3q6, max 12), IV dilaudid 1q3 (breakthrough only), [id number]  - Ketamine gtt (chronic pain managing)     CV/Pulm:  - HDS, oxygenating well on RA    FEN/GI:  F: ML  E: Replete PRN  N: Regular diet + protein supplements   - Strict I/O's    Renal/GU:  - TOV today     Heme/ID:  - Afebrile, WBC WNL  - Hgb 7.6 today, continue to monitor     Prophylaxis: prophylactic Lovenox, SCDs, out of bed/ambulate TID, IS, PPI  - Patient/family learning Lovenox injections for home  - Plan to continue Lovenox for 28 days from surgery in setting of inflammatory bowel disease history and the increased VTE risk    Access: peripheral IV    Dispo: floor status    Objective   Vital signs over the last 24 hours:  Temp:  [36.5 ??C (97.7 ??F)-36.9 ??C (98.4 ??F)] 36.5 ??C (97.7 ??F)  Heart Rate:  [76-99] 82  Resp:  [16-18] 17  BP: (106-136)/(61-77) 136/77  MAP (mmHg):  [77-93] 93  SpO2:  [96 %-99 %] 96 %    Wt Readings from Last 5 Encounters:   02/14/23 68.4 kg (150 lb 12.7 oz)   02/11/23 68.2 kg (150 lb 5.7 oz)   02/05/23 72.6 kg (160 lb) 01/29/23 72.6 kg (160 lb)   01/24/23 71.7 kg (158 lb)      Physical Exam:  Gen: alert, in mild distress, anxious  CV: regular rate, normotensive  Pulm: breathing comfortably on room air  Abd: soft, nondistended, and diffusely tender to palpation. Surgical incisions approximated with Dermabond, clean and dry.   Ext: warm and well perfused bilaterally, no edema

## 2023-02-18 LAB — CBC
HEMATOCRIT: 25.4 % — ABNORMAL LOW (ref 39.0–48.0)
HEMOGLOBIN: 8.4 g/dL — ABNORMAL LOW (ref 12.9–16.5)
MEAN CORPUSCULAR HEMOGLOBIN CONC: 33.1 g/dL (ref 32.0–36.0)
MEAN CORPUSCULAR HEMOGLOBIN: 30.5 pg (ref 25.9–32.4)
MEAN CORPUSCULAR VOLUME: 92.3 fL (ref 77.6–95.7)
MEAN PLATELET VOLUME: 9.6 fL (ref 6.8–10.7)
PLATELET COUNT: 233 10*9/L (ref 150–450)
RED BLOOD CELL COUNT: 2.75 10*12/L — ABNORMAL LOW (ref 4.26–5.60)
RED CELL DISTRIBUTION WIDTH: 13.6 % (ref 12.2–15.2)
WBC ADJUSTED: 6.2 10*9/L (ref 3.6–11.2)

## 2023-02-18 LAB — PHOSPHORUS: PHOSPHORUS: 3.7 mg/dL (ref 2.4–5.1)

## 2023-02-18 LAB — BASIC METABOLIC PANEL
ANION GAP: 8 mmol/L (ref 5–14)
BLOOD UREA NITROGEN: 5 mg/dL — ABNORMAL LOW (ref 9–23)
BUN / CREAT RATIO: 7
CALCIUM: 8.5 mg/dL — ABNORMAL LOW (ref 8.7–10.4)
CHLORIDE: 103 mmol/L (ref 98–107)
CO2: 30 mmol/L (ref 20.0–31.0)
CREATININE: 0.71 mg/dL — ABNORMAL LOW (ref 0.73–1.18)
EGFR CKD-EPI (2021) MALE: >90 mL/min/{1.73_m2} (ref >=60)
GLUCOSE RANDOM: 99 mg/dL (ref 70–179)
POTASSIUM: 4.2 mmol/L (ref 3.4–4.8)
SODIUM: 141 mmol/L (ref 135–145)

## 2023-02-18 LAB — HEPATIC FUNCTION PANEL
ALBUMIN: 2.8 g/dL — ABNORMAL LOW (ref 3.4–5.0)
ALKALINE PHOSPHATASE: 78 U/L (ref 46–116)
ALT (SGPT): 18 U/L (ref 10–49)
AST (SGOT): 22 U/L (ref <=34)
BILIRUBIN DIRECT: 0.1 mg/dL (ref 0.00–0.30)
BILIRUBIN TOTAL: 0.3 mg/dL (ref 0.3–1.2)
PROTEIN TOTAL: 5.7 g/dL (ref 5.7–8.2)

## 2023-02-18 LAB — MAGNESIUM: MAGNESIUM: 1.7 mg/dL (ref 1.6–2.6)

## 2023-02-18 MED ADMIN — HYDROmorphone (DILAUDID) injection 2 mg: 2 mg | INTRAVENOUS | @ 10:00:00 | Stop: 2023-02-28

## 2023-02-18 MED ADMIN — diazePAM (VALIUM) tablet 5 mg: 5 mg | ORAL | @ 19:00:00

## 2023-02-18 MED ADMIN — acetaminophen (TYLENOL) tablet 1,000 mg: 1000 mg | ORAL | @ 18:00:00

## 2023-02-18 MED ADMIN — oxyCODONE (ROXICODONE) immediate release tablet 15 mg: 15 mg | ORAL | @ 20:00:00 | Stop: 2023-02-25

## 2023-02-18 MED ADMIN — diazePAM (VALIUM) tablet 5 mg: 5 mg | ORAL | @ 02:00:00

## 2023-02-18 MED ADMIN — diazePAM (VALIUM) tablet 5 mg: 5 mg | ORAL | @ 09:00:00

## 2023-02-18 MED ADMIN — acetaminophen (TYLENOL) tablet 1,000 mg: 1000 mg | ORAL | @ 11:00:00

## 2023-02-18 MED ADMIN — oxyCODONE (ROXICODONE) immediate release tablet 15 mg: 15 mg | ORAL | @ 04:00:00 | Stop: 2023-02-25

## 2023-02-18 MED ADMIN — acetaminophen (TYLENOL) tablet 1,000 mg: 1000 mg | ORAL | @ 04:00:00

## 2023-02-18 MED ADMIN — magnesium hydroxide (MILK OF MAGNESIA) oral suspension: 30 mL | ORAL | @ 14:00:00 | Stop: 2023-02-18

## 2023-02-18 MED ADMIN — buprenorphine-naloxone (SUBOXONE) 2-0.5 mg SL film 2 mg of buprenorphine: 1 | SUBLINGUAL | @ 18:00:00

## 2023-02-18 MED ADMIN — enoxaparin (LOVENOX) syringe 40 mg: 40 mg | SUBCUTANEOUS | @ 14:00:00

## 2023-02-18 MED ADMIN — gabapentin (NEURONTIN) capsule 300 mg: 300 mg | ORAL | @ 18:00:00

## 2023-02-18 MED ADMIN — HYDROmorphone (DILAUDID) 50mg/50ml (1mg/ml) PCA CADD: INTRAVENOUS | @ 22:00:00 | Stop: 2023-03-01

## 2023-02-18 MED ADMIN — HYDROmorphone (DILAUDID) injection 2 mg: 2 mg | INTRAVENOUS | @ 23:00:00 | Stop: 2023-02-28

## 2023-02-18 MED ADMIN — gabapentin (NEURONTIN) capsule 300 mg: 300 mg | ORAL | @ 14:00:00

## 2023-02-18 MED ADMIN — oxyCODONE (ROXICODONE) immediate release tablet 15 mg: 15 mg | ORAL | @ 08:00:00 | Stop: 2023-02-25

## 2023-02-18 MED ADMIN — acetaminophen (TYLENOL) tablet 1,000 mg: 1000 mg | ORAL | @ 23:00:00

## 2023-02-18 MED ADMIN — buprenorphine-naloxone (SUBOXONE) 2-0.5 mg SL film 2 mg of buprenorphine: 1 | SUBLINGUAL | @ 23:00:00

## 2023-02-18 MED ADMIN — gabapentin (NEURONTIN) capsule 300 mg: 300 mg | ORAL | @ 04:00:00

## 2023-02-18 MED ADMIN — lamoTRIgine (LaMICtal) tablet 200 mg: 200 mg | ORAL | @ 14:00:00

## 2023-02-18 MED ADMIN — HYDROmorphone (DILAUDID) injection 2 mg: 2 mg | INTRAVENOUS | @ 14:00:00 | Stop: 2023-02-28

## 2023-02-18 MED ADMIN — buprenorphine-naloxone (SUBOXONE) 2-0.5 mg SL film 2 mg of buprenorphine: 1 | SUBLINGUAL | @ 10:00:00

## 2023-02-18 MED ADMIN — HYDROmorphone (DILAUDID) injection 2 mg: 2 mg | INTRAVENOUS | @ 18:00:00 | Stop: 2023-02-28

## 2023-02-18 MED ADMIN — nicotine (NICODERM CQ) 21 mg/24 hr patch 1 patch: 1 | TRANSDERMAL | @ 14:00:00

## 2023-02-18 MED ADMIN — oxyCODONE (ROXICODONE) immediate release tablet 15 mg: 15 mg | ORAL | @ 11:00:00 | Stop: 2023-02-25

## 2023-02-18 MED ADMIN — divalproex ER (DEPAKOTE ER) extended released 24 hr tablet 1,000 mg: 1000 mg | ORAL | @ 01:00:00

## 2023-02-18 MED ADMIN — HYDROmorphone (DILAUDID) injection 2 mg: 2 mg | INTRAVENOUS | @ 01:00:00 | Stop: 2023-02-28

## 2023-02-18 NOTE — Unmapped (Addendum)
Agency Name: [facility] Department of Health and Human/Social Services Address: [phone], [place], [place] [address] Phone: [phone] Website: [email or web address] Service(s) Offered: Sales executive, [facility] program    Agency Name: [employer] Address: [address] [place], [place] [address] Phone: [phone] Website: [email or web address] Services Offered: Transportation for a fee.    Recovery Assistance [facility]  [address]  [place], [place] [address]  Mailing Address:  [address]  [place] [address]  Phone: [phone]  Hours  Monday through Friday  8 a.m. to 5 p.m.

## 2023-02-18 NOTE — Unmapped (Signed)
Pt A&Ox4, VSS, on ketamine and pca. Pt complains of pain was given prns. No Bm overnight. Call bell within reach.  Problem: Wound  Goal: Optimal Coping  Outcome: Progressing  Goal: Optimal Functional Ability  Outcome: Progressing  Intervention: Optimize Functional Ability  Recent Flowsheet Documentation  Taken 02/17/2023 2000 by Zenaida Niece, RN  Activity Management: ambulated in room  Goal: Absence of Infection Signs and Symptoms  Outcome: Progressing  Goal: Improved Oral Intake  Outcome: Progressing  Goal: Optimal Pain Control and Function  Outcome: Progressing  Goal: Skin Health and Integrity  Outcome: Progressing  Intervention: Optimize Skin Protection  Recent Flowsheet Documentation  Taken 02/17/2023 2000 by Zenaida Niece, RN  Activity Management: ambulated in room  Goal: Optimal Wound Healing  Outcome: Progressing     Problem: Adult Inpatient Plan of Care  Goal: Plan of Care Review  Outcome: Progressing  Goal: Patient-Specific Goal (Individualized)  Outcome: Progressing  Goal: Absence of Hospital-Acquired Illness or Injury  Outcome: Progressing  Intervention: Prevent Skin Injury  Recent Flowsheet Documentation  Taken 02/17/2023 2000 by Zenaida Niece, RN  Positioning for Skin: Supine/Back  Goal: Optimal Comfort and Wellbeing  Outcome: Progressing  Goal: Readiness for Transition of Care  Outcome: Progressing  Goal: Rounds/Family Conference  Outcome: Progressing     Problem: Pain Acute  Goal: Optimal Pain Control and Function  Outcome: Progressing     Problem: Fall Injury Risk  Goal: Absence of Fall and Fall-Related Injury  Outcome: Progressing     Problem: Malnutrition  Goal: Improved Nutritional Intake  Outcome: Progressing

## 2023-02-18 NOTE — Unmapped (Signed)
Department of Anesthesiology  Pain Medicine Division    Chronic Pain Follow Up Inpatient Consult Note    Requesting Attending Physician:  Sharyn Lull, MD  Service Requesting Consult:  [facility]re[facility] Le[facility])    The patient is a 43 y.o. male w/PMH Crohn's disease s/p previous ileocolic resections, pneumonia, admitted on 02/14/23, now POD#2 s/p laparoscopic partial colectomy with removal of terminal ileum and ileocolostomy on 02/14/23.     We were consulted for complex pain management.    Home Analgesic Medications:  Suboxone 8-2 mg SL BID  OTC APAP 1g PO q4h PRN    Interval Events: None    PRN Medication Requirements: IV HM x6, PO oxycodone x3    Vital Sign Trends: Vitally stable, afebrile.    Labs: No notable derangements (creatinine, LFTs for ketamine infusion, glucose levels)    -The chronic pain service is a consult service and does not place orders, just makes recommendations (except ketamine and lidocaine infusions)   -Please evaluate all patients on opioids for appropriateness of prescribing narcan at discharge.  The chronic pain service can assist with this.  Nasal narcan is covered by most insurances.  -Recommendations given apply to the current hospitalization and do not reflect long term recommendations.    -Continue APAP 1g PO q8h [facility]  -Continue Suboxone 2-0.5 mg SL TID  -Continue Hydromorphone PCA 0.3mg /q10 min  -Continue hydromorphone 2 mg IV q3h PRN  -Continue oxycodone 15 mg PO q3h PRN     -Patient is on a low dose ketamine infusion for pain              -Ketamine gtt #1 of 4 maximum over a 12 month period               -All ketamine orders will be managed by the [facility] Chronic Pain Service only-orders can only be placed by attendings and fellows              -Ketamine requires dedicated IV (PIV or central line lumen)              -Continue ketamine infusion: 0.25 mg/kg/hr Actual body weight (BMI <40-Actual BW, BMI >40-IBW)              -Patient location: Step down: dose can increase every 4 hours (can decrease anytime)              -Day 3 (max-7 days). Started (date): 02/15/23              -Prior to starting: Recent EKG, CMP (creatinine, LFTs), urine pregnancy test (if applicable)              -Daily CMP (creatinine and LFTs daily) while running              -Please contact the Chronic Pain service with any questions or concerns about ketamine infusion.    Home MME: N/A - on Suboxone  Current MME: 675>640.5 today  PCA: 25.2 mg/24hr    We will continue to follow.    Naloxone Rx at discharge?  Is patient on opioids? Yes.  1)Is dose >50MME?  Yes.  2) Is patient prescribed a benzodiazepine (w opioids)? No.  3)Hx of overdose?  No.  4) Hx of substance use disorder? Yes.  5) Opioids likely to last greater than a week after discharge? Yes.    If yes to 2 or more, prescribe naloxone at discharge.  Nasal narcan for most insured (Nasal narcan 4mg /actuation, prescribe 1  kit, instructions at [email or web address]).  For uninsured, chronic pain can work to assist in finding an option.  OTC nasal narcan now available at most pharmacies for around $45.    Interim History  There were no Acute Events Overnight.  The patient is obtaining adequate pain relief on current medication regimen and feels that their pain is Controlled    Pt's ketamine was not increased by nursing to the recommended order of 0.25mg /kg/hr. This was adjusted this am and pt states he can tell a difference and that his pain is much better. Pt reports asking for pain meds and using his pump throughout the night. Would like to try to decrease the amount of IV opioids he uses. He states he's amazed at how quickly he seems to feel better compared with how he felt during his last surgery 11 years ago. He contributes this to the ketamine.No side effects from the ketamine or other pain medications, however states he hasn't had a BM yet. Plans on getting up and walking more today.    Analgesia Evaluation:  Pain at minimum: 2/10  Pain at maximum: 10/10    Inpatient Medications  Current Facility-Administered Medications   Medication Dose Route Frequency Provider Last Rate Last Admin      EXPAREL ADMINISTERED WITHIN 96 HRS - NO BUPIVACAINE FOR 96 HOURS AFTER EXPAREL  1 each Other Continuous Emilio Aspen, MD   1 each at 02/14/23 1221    acetaminophen (TYLENOL) tablet 1,000 mg  1,000 mg Oral Q6H SCH Emilio Aspen, MD   1,000 mg at 02/18/23 0620    albuterol (PROVENTIL HFA;VENTOLIN HFA) 90 mcg/actuation inhaler 2 puff  2 puff Inhalation Q4H PRN Emilio Aspen, MD        buprenorphine-naloxone (SUBOXONE) 2-0.5 mg SL film 2 mg of buprenorphine  1 Film Sublingual TID Dawson Bills, MD   2 mg of buprenorphine at 02/18/23 0500    calcium carbonate (TUMS) chewable tablet 400 mg elem calcium  400 mg elem calcium Oral TID PRN Rookard, Alaina A, PA   400 mg elem calcium at 02/15/23 1413    diazePAM (VALIUM) tablet 5 mg  5 mg Oral Q6H PRN Emilio Aspen, MD   5 mg at 02/18/23 0330    divalproex ER (DEPAKOTE ER) extended released 24 hr tablet 1,000 mg  1,000 mg Oral Nightly Lorre Nick, Edsel Petrin, MD/DMD   1,000 mg at 02/17/23 2029    enoxaparin (LOVENOX) syringe 40 mg  40 mg Subcutaneous Q24H SCH Emilio Aspen, MD   40 mg at 02/17/23 3295    gabapentin (NEURONTIN) capsule 300 mg  300 mg Oral TID Billey Chang, MD   300 mg at 02/17/23 2251    HYDROmorphone (DILAUDID) 50mg /8ml (1mg /ml) PCA CADD   Intravenous Continuous Dawson Bills, MD   New Syringe/Cartridge at 02/17/23 0542    HYDROmorphone (PF) (DILAUDID) injection 1 mg  1 mg Intravenous Q3H PRN Zappas, Barbie Haggis, MD        Or    HYDROmorphone (DILAUDID) injection 2 mg  2 mg Intravenous Q3H PRN Zappas, Barbie Haggis, MD   2 mg at 02/18/23 0454    ketamine 500 mg in sodium chloride 0.9 % 250 mL (2 mg/mL)  0.25 mg/kg/hr Intravenous Continuous Karma Greaser, MD 8.55 mL/hr at 02/17/23 0852 0.25 mg/kg/hr at 02/17/23 1884    lactated Ringers infusion  10 mL/hr Intravenous Continuous Emilio Aspen, MD lamoTRIgine (LaMICtal) tablet 200 mg  200 mg Oral Daily  Emilio Aspen, MD   200 mg at 02/17/23 0981    magnesium hydroxide (MILK OF MAGNESIA) oral suspension  30 mL Oral Once Nicholaus Corolla I, MD        nicotine (NICODERM CQ) 21 mg/24 hr patch 1 patch  1 patch Transdermal Daily Rookard, Alaina A, PA   1 patch at 02/17/23 0852    nicotine polacrilex (NICORETTE) gum 4 mg  4 mg Buccal Q1H PRN Nicholaus Corolla I, MD        nicotine polacrilex (NICORETTE) lozenge 4 mg  4 mg Buccal Q1H PRN Nicholaus Corolla I, MD        ondansetron (ZOFRAN-ODT) disintegrating tablet 4 mg  4 mg Oral Q6H PRN Emilio Aspen, MD        Or    ondansetron [facility]) injection 4 mg  4 mg Intravenous Q6H PRN Emilio Aspen, MD   4 mg at 02/14/23 2259    oxyCODONE (ROXICODONE) immediate release tablet 10 mg  10 mg Oral Q3H PRN Zappas, Barbie Haggis, MD        Or    oxyCODONE (ROXICODONE) immediate release tablet 15 mg  15 mg Oral Q3H PRN Zappas, Barbie Haggis, MD   15 mg at 02/18/23 0622    sodium chloride (NS) 0.9 % infusion  10 mL/hr Intravenous Continuous Cloyde Reams D, DO 10 mL/hr at 02/15/23 1112 10 mL/hr at 02/15/23 1112     Objective:     Vital Signs    Temp:  [36.5 ??C (97.7 ??F)-36.8 ??C (98.2 ??F)] 36.5 ??C (97.7 ??F)  Heart Rate:  [81-88] 86  Resp:  [17-18] 18  BP: (116-136)/(68-79) 123/72  MAP (mmHg):  [79-93] 85  SpO2:  [96 %-100 %] 98 %    Physical Exam  GENERAL:  Well developed, well-nourished male and is in mild distress.   HEAD/NECK:    Reveals normocephalic/atraumatic.   CARDIOVASCULAR:   Warm, well perfused  LUNGS:   Normal work of breathing, no supplemental 02  EXTREMITIES:  Warm, no clubbing, cyanosis, or edema was noted.  NEUROLOGIC:    The patient was alert and oriented times four with normal language, attention, cognition and memory. MAEW. Cranial nerve exam was grossly normal.   MUSCULOSKELETAL:    No deformities.  SKIN:  No obvious rashes lesions or erythema  PSY:  Appropriate affect and mood.    Test Results    Lab Results Component Value Date    CREATININE 0.71 (L) 02/18/2023     Lab Results   Component Value Date    ALKPHOS 78 02/18/2023    BILITOT 0.3 02/18/2023    BILIDIR 0.10 02/18/2023    PROT 5.7 02/18/2023    ALBUMIN 2.8 (L) 02/18/2023    ALT 18 02/18/2023    AST 22 02/18/2023     Problem List    Principal Problem:    Crohn's disease of colon with complication (CMS-HCC)  Active Problems:    Tobacco use disorder    Stark Klein, MD  Addiction Medicine Fellow

## 2023-02-18 NOTE — Unmapped (Signed)
Pt following care plan without incident. Pt reports positive pain control. Pt continues to request all of his prn pain medications on schedule.      Problem: Wound  Goal: Optimal Coping  Outcome: Ongoing - Unchanged  Goal: Optimal Functional Ability  Outcome: Ongoing - Unchanged  Goal: Absence of Infection Signs and Symptoms  Outcome: Ongoing - Unchanged  Goal: Improved Oral Intake  Outcome: Ongoing - Unchanged  Goal: Optimal Pain Control and Function  Outcome: Ongoing - Unchanged  Goal: Skin Health and Integrity  Outcome: Ongoing - Unchanged  Intervention: Optimize Skin Protection  Recent Flowsheet Documentation  Taken 02/18/2023 0753 by Patrcia Dolly, RN  Pressure Reduction Techniques: frequent weight shift encouraged  Pressure Reduction Devices: pressure-redistributing mattress utilized  Goal: Optimal Wound Healing  Outcome: Ongoing - Unchanged     Problem: Adult Inpatient Plan of Care  Goal: Plan of Care Review  Outcome: Ongoing - Unchanged  Goal: Patient-Specific Goal (Individualized)  Outcome: Ongoing - Unchanged  Goal: Absence of Hospital-Acquired Illness or Injury  Outcome: Ongoing - Unchanged  Intervention: Identify and Manage Fall Risk  Recent Flowsheet Documentation  Taken 02/18/2023 0753 by Patrcia Dolly, RN  Safety Interventions:   low bed   fall reduction program maintained   nonskid shoes/slippers when out of bed  Intervention: Prevent Skin Injury  Recent Flowsheet Documentation  Taken 02/18/2023 0753 by Patrcia Dolly, RN  Positioning for Skin: Supine/Back  Device Skin Pressure Protection: absorbent pad utilized/changed  Intervention: Prevent and Manage VTE (Venous Thromboembolism) Risk  Recent Flowsheet Documentation  Taken 02/18/2023 0753 by Patrcia Dolly, RN  Anti-Embolism Device Type: SCD, Knee  Anti-Embolism Device Status: On  Anti-Embolism Device Location: BLE  Goal: Optimal Comfort and Wellbeing  Outcome: Ongoing - Unchanged  Goal: Readiness for Transition of Care  Outcome: Ongoing - Unchanged  Goal: Rounds/Family Conference  Outcome: Ongoing - Unchanged     Problem: Pain Acute  Goal: Optimal Pain Control and Function  Outcome: Ongoing - Unchanged     Problem: Fall Injury Risk  Goal: Absence of Fall and Fall-Related Injury  Outcome: Ongoing - Unchanged  Intervention: Promote Injury-Free Environment  Recent Flowsheet Documentation  Taken 02/18/2023 0753 by Patrcia Dolly, RN  Safety Interventions:   low bed   fall reduction program maintained   nonskid shoes/slippers when out of bed     Problem: Malnutrition  Goal: Improved Nutritional Intake  Outcome: Ongoing - Unchanged

## 2023-02-18 NOTE — Unmapped (Signed)
Addendum  created 02/18/23 0843 by Olegario Messier, MD    Clinical Note Signed, Intraprocedure Event edited

## 2023-02-18 NOTE — Unmapped (Signed)
Colorectal Surgery Progress Note    Today's Date: 02/18/2023  Admission Date: 02/14/2023  Length of Stay: Day 4, 4 Days Post-Op  Hospital Service: Henreitta Leber [facility])  Attending Physician: Sharyn Lull, MD     Assessment   Principal Problem:    Crohn's disease of colon with complication (CMS-HCC)  Active Problems:    Tobacco use disorder    Dakota Townsend is a 43 y.o. male with a medical history significant for Crohn's with several previous ileocolic resections. The patient is s/p laparoscopic partial colectomy with removal of terminal ileum with ileocolostomy on 2/6.    Subjective   NAEON. Expressing pain is tolerable but is hindering his ambulation. Chronic pain managing.   Hiccups have stopped. Denies N/V. (-) Gas, (-) BM. Ordered 1x dose milk mag.   Tolerating regular diet.     Plan   Neuro/Psych:  #Pain control  - SCH: PO tylenol 1000q6, gabapentin 300 TID  home buprenorphine  - PRN: dPCA (0.3q6, max 7.2), IV dilaudid 1/2q3 (breakthrough only), Oxy10/15q3, valium 5q6  - Ketamine gtt (chronic pain managing)     CV/Pulm:  - HDS, oxygenating well on RA    FEN/GI:  F: ML  E: Replete PRN  N: Regular diet + protein supplements   - Strict I/O's    Renal/GU:  - TOV today     Heme/ID:  - Afebrile, WBC WNL  - Hgb 8.4 today, continue to monitor     Prophylaxis: prophylactic Lovenox, SCDs, out of bed/ambulate TID, IS, PPI  - Patient/family learning Lovenox injections for home  - Plan to continue Lovenox for 28 days from surgery in setting of inflammatory bowel disease history and the increased VTE risk    Access: peripheral IV    Dispo: floor status    Objective   Vital signs over the last 24 hours:  Temp:  [36.5 ??C (97.7 ??F)-36.8 ??C (98.2 ??F)] 36.5 ??C (97.7 ??F)  Heart Rate:  [81-88] 86  Resp:  [17-18] 18  BP: (116-136)/(68-79) 123/72  MAP (mmHg):  [79-93] 85  SpO2:  [96 %-100 %] 98 %    Wt Readings from Last 5 Encounters:   02/14/23 68.4 kg (150 lb 12.7 oz)   02/11/23 68.2 kg (150 lb 5.7 oz)   02/05/23 72.6 kg (160 lb)   01/29/23 72.6 kg (160 lb)   01/24/23 71.7 kg (158 lb)      Physical Exam:  Gen: alert, tired appearing, in no acute distress  CV: regular rate, normotensive  Pulm: breathing comfortably on room air  Abd: soft, nondistended, and diffusely tender to palpation. Surgical incisions approximated with Dermabond, clean and dry.  Ext: warm and well perfused bilaterally, no edema    Nicholaus Corolla, MD  PGY-1 Vascular Surgery

## 2023-02-19 LAB — HEPATIC FUNCTION PANEL
ALBUMIN: 2.9 g/dL — ABNORMAL LOW (ref 3.4–5.0)
ALKALINE PHOSPHATASE: 102 U/L (ref 46–116)
ALT (SGPT): 44 U/L (ref 10–49)
AST (SGOT): 55 U/L — ABNORMAL HIGH (ref <=34)
BILIRUBIN DIRECT: <0.1 mg/dL (ref 0.00–0.30)
BILIRUBIN TOTAL: 0.2 mg/dL — ABNORMAL LOW (ref 0.3–1.2)
PROTEIN TOTAL: 5.7 g/dL (ref 5.7–8.2)

## 2023-02-19 MED ADMIN — buprenorphine-naloxone (SUBOXONE) 2-0.5 mg SL film 2 mg of buprenorphine: 1 | SUBLINGUAL | @ 11:00:00

## 2023-02-19 MED ADMIN — buprenorphine-naloxone (SUBOXONE) 2-0.5 mg SL film 2 mg of buprenorphine: 1 | SUBLINGUAL | @ 23:00:00

## 2023-02-19 MED ADMIN — oxyCODONE (ROXICODONE) immediate release tablet 15 mg: 15 mg | ORAL | @ 21:00:00 | Stop: 2023-02-24

## 2023-02-19 MED ADMIN — oxyCODONE (ROXICODONE) immediate release tablet 15 mg: 15 mg | ORAL | @ 18:00:00 | Stop: 2023-02-24

## 2023-02-19 MED ADMIN — nicotine (NICODERM CQ) 21 mg/24 hr patch 1 patch: 1 | TRANSDERMAL | @ 15:00:00

## 2023-02-19 MED ADMIN — HYDROmorphone (DILAUDID) injection 2 mg: 2 mg | INTRAVENOUS | @ 09:00:00 | Stop: 2023-02-28

## 2023-02-19 MED ADMIN — oxyCODONE (ROXICODONE) immediate release tablet 15 mg: 15 mg | ORAL | @ 04:00:00 | Stop: 2023-02-25

## 2023-02-19 MED ADMIN — acetaminophen (TYLENOL) tablet 1,000 mg: 1000 mg | ORAL | @ 23:00:00

## 2023-02-19 MED ADMIN — gabapentin (NEURONTIN) capsule 300 mg: 300 mg | ORAL | @ 03:00:00

## 2023-02-19 MED ADMIN — oxyCODONE (ROXICODONE) immediate release tablet 15 mg: 15 mg | ORAL | @ 01:00:00 | Stop: 2023-02-25

## 2023-02-19 MED ADMIN — divalproex ER (DEPAKOTE ER) extended released 24 hr tablet 1,000 mg: 1000 mg | ORAL | @ 03:00:00

## 2023-02-19 MED ADMIN — enoxaparin (LOVENOX) syringe 40 mg: 40 mg | SUBCUTANEOUS | @ 15:00:00

## 2023-02-19 MED ADMIN — oxyCODONE (ROXICODONE) immediate release tablet 15 mg: 15 mg | ORAL | @ 15:00:00 | Stop: 2023-02-19

## 2023-02-19 MED ADMIN — oxyCODONE (ROXICODONE) immediate release tablet 15 mg: 15 mg | ORAL | @ 23:00:00 | Stop: 2023-02-24

## 2023-02-19 MED ADMIN — HYDROmorphone (DILAUDID) injection 2 mg: 2 mg | INTRAVENOUS | @ 20:00:00 | Stop: 2023-02-28

## 2023-02-19 MED ADMIN — oxyCODONE (ROXICODONE) immediate release tablet 15 mg: 15 mg | ORAL | @ 11:00:00 | Stop: 2023-02-19

## 2023-02-19 MED ADMIN — HYDROmorphone (DILAUDID) injection 2 mg: 2 mg | INTRAVENOUS | @ 13:00:00 | Stop: 2023-02-28

## 2023-02-19 MED ADMIN — diazePAM (VALIUM) tablet 5 mg: 5 mg | ORAL | @ 16:00:00

## 2023-02-19 MED ADMIN — lamoTRIgine (LaMICtal) tablet 200 mg: 200 mg | ORAL | @ 15:00:00

## 2023-02-19 MED ADMIN — diazePAM (VALIUM) tablet 5 mg: 5 mg | ORAL | @ 08:00:00

## 2023-02-19 MED ADMIN — buprenorphine-naloxone (SUBOXONE) 2-0.5 mg SL film 2 mg of buprenorphine: 1 | SUBLINGUAL | @ 18:00:00

## 2023-02-19 MED ADMIN — HYDROmorphone (DILAUDID) injection 2 mg: 2 mg | INTRAVENOUS | @ 16:00:00 | Stop: 2023-02-28

## 2023-02-19 MED ADMIN — acetaminophen (TYLENOL) tablet 1,000 mg: 1000 mg | ORAL | @ 18:00:00

## 2023-02-19 MED ADMIN — HYDROmorphone (DILAUDID) injection 2 mg: 2 mg | INTRAVENOUS | @ 06:00:00 | Stop: 2023-02-28

## 2023-02-19 MED ADMIN — acetaminophen (TYLENOL) tablet 1,000 mg: 1000 mg | ORAL | @ 11:00:00

## 2023-02-19 MED ADMIN — gabapentin (NEURONTIN) capsule 300 mg: 300 mg | ORAL | @ 13:00:00

## 2023-02-19 MED ADMIN — oxyCODONE (ROXICODONE) immediate release tablet 15 mg: 15 mg | ORAL | @ 07:00:00 | Stop: 2023-02-19

## 2023-02-19 MED ADMIN — gabapentin (NEURONTIN) capsule 300 mg: 300 mg | ORAL | @ 20:00:00

## 2023-02-19 MED ADMIN — HYDROmorphone (DILAUDID) injection 2 mg: 2 mg | INTRAVENOUS | @ 03:00:00 | Stop: 2023-02-28

## 2023-02-19 NOTE — Unmapped (Signed)
Pt alert and oriented, VSS. Pt c/o constant abd pains, PRNS given as needed overnight. Dilaudid PCA and Ketamine remains infusing overnight. Abd surgical site OTA CDI. Pt tolerating oral diet, denies n/v overnight. Pt stated he had a BM overnight. UTA due to pt stated he flushed BM. Pt stated (+) flatulence. Pt remains free of falls, bed in lowest position, call bell and PCA buttons within reach.    Problem: Pain Acute  Goal: Optimal Pain Control and Function  Outcome: Ongoing - Unchanged     Problem: Malnutrition  Goal: Improved Nutritional Intake  Outcome: Ongoing - Unchanged     Problem: Adult Inpatient Plan of Care  Goal: Plan of Care Review  Outcome: Progressing  Goal: Patient-Specific Goal (Individualized)  Outcome: Progressing  Goal: Absence of Hospital-Acquired Illness or Injury  Outcome: Progressing     Problem: Fall Injury Risk  Goal: Absence of Fall and Fall-Related Injury  Outcome: Progressing

## 2023-02-19 NOTE — Unmapped (Signed)
Colorectal Surgery Progress Note    Today's Date: 02/19/2023  Admission Date: 02/14/2023  Length of Stay: Day 5, 5 Days Post-Op  Hospital Service: [facility]  Attending Physician: Sharyn Lull, MD     Assessment   Principal Problem:    Crohn's disease of colon with complication (CMS-HCC)  Active Problems:    Tobacco use disorder    Dakota Townsend is a 43 y.o. male with a medical history significant for Crohn's with several previous ileocolic resections. The patient is s/p laparoscopic partial colectomy with removal of terminal ileum with ileocolostomy on 2/6.    Subjective   NAEON. Walked 1.5 laps yesterday.   Denies N/V. (-) Gas, (+) BM. Tolerating regular diet.   Plan to wean down pain regimen.     Plan   Neuro/Psych:  #Pain control  - SCH: PO tylenol 1000q6, gabapentin 300 TID  home buprenorphine  - PRN: dPCA (0.3q6, max 7.2), IV dilaudid 1/2q3 (breakthrough only), Oxy10/15q3, valium 5q6  - Ketamine gtt (chronic pain managing)     CV/Pulm:  - HDS, oxygenating well on RA    FEN/GI:  F: ML  E: Replete PRN  N: Regular diet + protein supplements   - Strict I/O's    Renal/GU:  - TOV today     Heme/ID:  - Afebrile, WBC WNL  - Hgb 8.4 today, continue to monitor     Prophylaxis: prophylactic Lovenox, SCDs, out of bed/ambulate TID, IS, PPI  - Patient/family learning Lovenox injections for home  - Plan to continue Lovenox for 28 days from surgery in setting of inflammatory bowel disease history and the increased VTE risk    Access: peripheral IV    Dispo: floor status    Objective   Vital signs over the last 24 hours:  Temp:  [36.6 ??C (97.9 ??F)-36.9 ??C (98.4 ??F)] 36.6 ??C (97.9 ??F)  Heart Rate:  [76-98] 89  Resp:  [16-18] 18  BP: (106-131)/(55-78) 111/59  MAP (mmHg):  [71-91] 75  SpO2:  [93 %-98 %] 95 %    Wt Readings from Last 5 Encounters:   02/14/23 68.4 kg (150 lb 12.7 oz)   02/11/23 68.2 kg (150 lb 5.7 oz)   02/05/23 72.6 kg (160 lb)   01/29/23 72.6 kg (160 lb)   01/24/23 71.7 kg (158 lb) Physical Exam:  Gen: alert, tired appearing, in no acute distress  CV: regular rate, normotensive  Pulm: breathing comfortably on room air  Abd: soft, nondistended, and diffusely tender to palpation. Surgical incisions approximated with Dermabond, clean and dry.  Ext: warm and well perfused bilaterally, no edema    Nicholaus Corolla, MD  PGY-1 Vascular Surgery

## 2023-02-19 NOTE — Unmapped (Signed)
Patient AOx4, VSS, afebrile. Patient complain of severe abdominal pain all day and muscle spasms, and he called around the clock and received all his PRN pain medication and Valium, reporting very little relieve of symptoms

## 2023-02-19 NOTE — Unmapped (Signed)
 Department of Anesthesiology  Pain Medicine Division    Chronic Pain Follow Up Inpatient Consult Note    Requesting Attending Physician:  Sharyn Lull, MD  Service Requesting Consult:  Henreitta Leber [facility])    The patient is a 43 y.o. male w/PMH Crohn's disease s/p previous ileocolic resections, pneumonia, admitted on 02/14/23, now POD#5 s/p laparoscopic partial colectomy with removal of terminal ileum and ileocolostomy on 02/14/23.     We were consulted for complex pain management.    Home Analgesic Medications:  Suboxone 8-2 mg SL BID  OTC APAP 1g PO q4h PRN    Interval Events: None    PRN Medication Requirements: IV HM x6, PO oxycodone x3    Vital Sign Trends: Vitally stable, afebrile.    Labs: No notable derangements (creatinine, LFTs for ketamine infusion, glucose levels)    -The chronic pain service is a consult service and does not place orders, just makes recommendations (except ketamine and lidocaine infusions)   -Please evaluate all patients on opioids for appropriateness of prescribing narcan at discharge.  The chronic pain service can assist with this.  Nasal narcan is covered by most insurances.  -Recommendations given apply to the current hospitalization and do not reflect long term recommendations.    -Continue APAP 1g PO q8h [facility]  -Continue Suboxone 2-0.5 mg SL TID  -Decrease Hydromorphone PCA to  0.2mg /q15 min  -Continue hydromorphone 2 mg IV q3h PRN  -Continue oxycodone 15 mg po q6h SCH  -Continue oxycodone 10-15 mg PO q6h PRN     -Patient is on a low dose ketamine infusion for pain              -Ketamine gtt #1 of 4 maximum over a 12 month period               -All ketamine orders will be managed by the [facility] Chronic Pain Service only-orders can only be placed by attendings and fellows              -Ketamine requires dedicated IV (PIV or central line lumen)              -Continue ketamine infusion: 0.25 mg/kg/hr Actual body weight (BMI <40-Actual BW, BMI >40-IBW)              -Patient location: Step down: dose can increase every 4 hours (can decrease anytime)              -Day 4 (max-7 days). Started (date): 02/15/23              -Prior to starting: Recent EKG, CMP (creatinine, LFTs), urine pregnancy test (if applicable)              -Daily CMP (creatinine and LFTs daily) while running              -Please contact the Chronic Pain service with any questions or concerns about ketamine infusion.    Home MME: N/A - on Suboxone  Current MME: 640.5>625.5 today  PCA: 25.2 mg/24hr    We will continue to follow.    Naloxone Rx at discharge?  Is patient on opioids? Yes.  1)Is dose >50MME?  Yes.  2) Is patient prescribed a benzodiazepine (w opioids)? No.  3)Hx of overdose?  No.  4) Hx of substance use disorder? Yes.  5) Opioids likely to last greater than a week after discharge? Yes.    If yes to 2 or more, prescribe naloxone at discharge.  Nasal narcan for most insured (Nasal narcan 4mg /actuation, prescribe 1 kit, instructions at [email or web address]).  For uninsured, chronic pain can work to assist in finding an option.  OTC nasal narcan now available at most pharmacies for around $45.    Interim History  There were no Acute Events Overnight.  The patient is obtaining adequate pain relief on current medication regimen and feels that their pain is Somewhat controlled    Pt states pain ok at this time, however states yesterday am he had a hard time getting his pain meds and he got behind. States he asked at 10:45 (when it was due) and didn't get it until 1pm. Pt states he only got out of bed 1 time due to this. Concerned about not getting his PRN meds when asked.     Discussed going down on PCA and scheduling a po pain med. He is also worried that he will not get his valium because the nurse will not give it to him the same time as his pain meds. Told him we would discuss this with nursing.   Has had 2 BMs and planning on trying to get up walking again today.       Analgesia Evaluation:  Pain at minimum: 2/10  Pain at maximum: 8/10    Inpatient Medications  Current Facility-Administered Medications   Medication Dose Route Frequency Provider Last Rate Last Admin    acetaminophen (TYLENOL) tablet 1,000 mg  1,000 mg Oral Q6H SCH Emilio Aspen, MD   1,000 mg at 02/19/23 0621    albuterol (PROVENTIL HFA;VENTOLIN HFA) 90 mcg/actuation inhaler 2 puff  2 puff Inhalation Q4H PRN Emilio Aspen, MD        buprenorphine-naloxone (SUBOXONE) 2-0.5 mg SL film 2 mg of buprenorphine  1 Film Sublingual TID Dawson Bills, MD   2 mg of buprenorphine at 02/19/23 0622    calcium carbonate (TUMS) chewable tablet 400 mg elem calcium  400 mg elem calcium Oral TID PRN Rookard, Alaina A, PA   400 mg elem calcium at 02/15/23 1413    diazePAM (VALIUM) tablet 5 mg  5 mg Oral Q6H PRN Emilio Aspen, MD   5 mg at 02/19/23 0306    divalproex ER (DEPAKOTE ER) extended released 24 hr tablet 1,000 mg  1,000 mg Oral Nightly Lorre Nick, Edsel Petrin, MD/DMD   1,000 mg at 02/18/23 2159    enoxaparin (LOVENOX) syringe 40 mg  40 mg Subcutaneous Q24H SCH Emilio Aspen, MD   40 mg at 02/18/23 0844    gabapentin (NEURONTIN) capsule 300 mg  300 mg Oral TID Billey Chang, MD   300 mg at 02/19/23 0754    HYDROmorphone (DILAUDID) 50mg /74ml (1mg /ml) PCA CADD   Intravenous Continuous Dawson Bills, MD   New Syringe/Cartridge at 02/18/23 1716    HYDROmorphone (PF) (DILAUDID) injection 1 mg  1 mg Intravenous Q3H PRN Zappas, Barbie Haggis, MD        Or    HYDROmorphone (DILAUDID) injection 2 mg  2 mg Intravenous Q3H PRN Zappas, Barbie Haggis, MD   2 mg at 02/19/23 0754    ketamine 500 mg in sodium chloride 0.9 % 250 mL (2 mg/mL)  0.25 mg/kg/hr Intravenous Continuous Karma Greaser, MD 8.55 mL/hr at 02/17/23 0852 0.25 mg/kg/hr at 02/17/23 8469    lactated Ringers infusion  10 mL/hr Intravenous Continuous Emilio Aspen, MD        lamoTRIgine (LaMICtal) tablet 200 mg  200 mg Oral Daily  Emilio Aspen, MD   200 mg at 02/18/23 0845    nicotine (NICODERM CQ) 21 mg/24 hr patch 1 patch  1 patch Transdermal Daily Rookard, Alaina A, PA   1 patch at 02/18/23 0846    nicotine polacrilex (NICORETTE) gum 4 mg  4 mg Buccal Q1H PRN Nicholaus Corolla I, MD        nicotine polacrilex (NICORETTE) lozenge 4 mg  4 mg Buccal Q1H PRN Nicholaus Corolla I, MD        ondansetron (ZOFRAN-ODT) disintegrating tablet 4 mg  4 mg Oral Q6H PRN Emilio Aspen, MD        Or    ondansetron [facility]) injection 4 mg  4 mg Intravenous Q6H PRN Emilio Aspen, MD   4 mg at 02/14/23 2259    oxyCODONE (ROXICODONE) immediate release tablet 10 mg  10 mg Oral Q3H PRN Zappas, Barbie Haggis, MD        Or    oxyCODONE (ROXICODONE) immediate release tablet 15 mg  15 mg Oral Q3H PRN Zappas, Barbie Haggis, MD   15 mg at 02/19/23 0981    sodium chloride (NS) 0.9 % infusion  10 mL/hr Intravenous Continuous Cloyde Reams D, DO 10 mL/hr at 02/15/23 1112 10 mL/hr at 02/15/23 1112     Objective:     Vital Signs    Temp:  [36.6 ??C (97.9 ??F)-36.9 ??C (98.4 ??F)] 36.6 ??C (97.9 ??F)  Heart Rate:  [76-98] 89  Resp:  [16-18] 18  BP: (106-131)/(55-78) 111/59  MAP (mmHg):  [71-91] 75  SpO2:  [93 %-98 %] 95 %    Physical Exam  GENERAL:  Well developed, well-nourished male and is in mild distress.   HEAD/NECK:    Reveals normocephalic/atraumatic.   CARDIOVASCULAR:   Warm, well perfused  LUNGS:   Normal work of breathing, no supplemental 02  EXTREMITIES:  Warm, no clubbing, cyanosis, or edema was noted.  NEUROLOGIC:    The patient was alert and oriented times four with normal language, attention, cognition and memory. MAEW. Cranial nerve exam was grossly normal.   MUSCULOSKELETAL:    No deformities.  SKIN:  No obvious rashes lesions or erythema  PSY:  Appropriate affect and mood.    Test Results    Lab Results   Component Value Date    CREATININE 0.71 (L) 02/18/2023     Lab Results   Component Value Date    ALKPHOS 102 02/19/2023    BILITOT 0.2 (L) 02/19/2023    BILIDIR <0.10 02/19/2023    PROT 5.7 02/19/2023    ALBUMIN 2.9 (L) 02/19/2023 ALT 44 02/19/2023    AST 55 (H) 02/19/2023     Problem List    Principal Problem:    Crohn's disease of colon with complication (CMS-HCC)  Active Problems:    Tobacco use disorder    Stark Klein, MD  Addiction Medicine Fellow

## 2023-02-20 LAB — BASIC METABOLIC PANEL
ANION GAP: 10 mmol/L (ref 5–14)
BLOOD UREA NITROGEN: 7 mg/dL — ABNORMAL LOW (ref 9–23)
BUN / CREAT RATIO: 10
CALCIUM: 8.3 mg/dL — ABNORMAL LOW (ref 8.7–10.4)
CHLORIDE: 105 mmol/L (ref 98–107)
CO2: 26 mmol/L (ref 20.0–31.0)
CREATININE: 0.72 mg/dL — ABNORMAL LOW (ref 0.73–1.18)
EGFR CKD-EPI (2021) MALE: >90 mL/min/{1.73_m2} (ref >=60)
GLUCOSE RANDOM: 96 mg/dL (ref 70–179)
POTASSIUM: 4.4 mmol/L (ref 3.4–4.8)
SODIUM: 141 mmol/L (ref 135–145)

## 2023-02-20 LAB — CBC
HEMATOCRIT: 24.1 % — ABNORMAL LOW (ref 39.0–48.0)
HEMOGLOBIN: 7.9 g/dL — ABNORMAL LOW (ref 12.9–16.5)
MEAN CORPUSCULAR HEMOGLOBIN CONC: 32.7 g/dL (ref 32.0–36.0)
MEAN CORPUSCULAR HEMOGLOBIN: 30.2 pg (ref 25.9–32.4)
MEAN CORPUSCULAR VOLUME: 92.5 fL (ref 77.6–95.7)
MEAN PLATELET VOLUME: 9 fL (ref 6.8–10.7)
PLATELET COUNT: 273 10*9/L (ref 150–450)
RED BLOOD CELL COUNT: 2.61 10*12/L — ABNORMAL LOW (ref 4.26–5.60)
RED CELL DISTRIBUTION WIDTH: 13.6 % (ref 12.2–15.2)
WBC ADJUSTED: 5.5 10*9/L (ref 3.6–11.2)

## 2023-02-20 LAB — HEPATIC FUNCTION PANEL
ALBUMIN: 2.9 g/dL — ABNORMAL LOW (ref 3.4–5.0)
ALKALINE PHOSPHATASE: 119 U/L — ABNORMAL HIGH (ref 46–116)
ALT (SGPT): 47 U/L (ref 10–49)
AST (SGOT): 44 U/L — ABNORMAL HIGH (ref <=34)
BILIRUBIN DIRECT: <0.1 mg/dL (ref 0.00–0.30)
BILIRUBIN TOTAL: 0.2 mg/dL — ABNORMAL LOW (ref 0.3–1.2)
PROTEIN TOTAL: 5.7 g/dL (ref 5.7–8.2)

## 2023-02-20 LAB — PHOSPHORUS: PHOSPHORUS: 4.4 mg/dL (ref 2.4–5.1)

## 2023-02-20 LAB — MAGNESIUM: MAGNESIUM: 2 mg/dL (ref 1.6–2.6)

## 2023-02-20 MED ADMIN — diazePAM (VALIUM) tablet 5 mg: 5 mg | ORAL | @ 12:00:00

## 2023-02-20 MED ADMIN — nicotine (NICODERM CQ) 21 mg/24 hr patch 1 patch: 1 | TRANSDERMAL | @ 14:00:00

## 2023-02-20 MED ADMIN — gabapentin (NEURONTIN) capsule 300 mg: 300 mg | ORAL | @ 18:00:00

## 2023-02-20 MED ADMIN — buprenorphine-naloxone (SUBOXONE) 2-0.5 mg SL film 2 mg of buprenorphine: 1 | SUBLINGUAL | @ 17:00:00

## 2023-02-20 MED ADMIN — gabapentin (NEURONTIN) capsule 300 mg: 300 mg | ORAL | @ 14:00:00

## 2023-02-20 MED ADMIN — HYDROmorphone (DILAUDID) injection 2 mg: 2 mg | INTRAVENOUS | @ 22:00:00 | Stop: 2023-02-28

## 2023-02-20 MED ADMIN — diazePAM (VALIUM) tablet 5 mg: 5 mg | ORAL | @ 23:00:00

## 2023-02-20 MED ADMIN — divalproex ER (DEPAKOTE ER) extended released 24 hr tablet 1,000 mg: 1000 mg | ORAL | @ 02:00:00

## 2023-02-20 MED ADMIN — oxyCODONE (ROXICODONE) immediate release tablet 15 mg: 15 mg | ORAL | @ 06:00:00 | Stop: 2023-02-24

## 2023-02-20 MED ADMIN — oxyCODONE (ROXICODONE) immediate release tablet 15 mg: 15 mg | ORAL | @ 04:00:00 | Stop: 2023-02-24

## 2023-02-20 MED ADMIN — HYDROmorphone (DILAUDID) injection 2 mg: 2 mg | INTRAVENOUS | @ 01:00:00 | Stop: 2023-02-28

## 2023-02-20 MED ADMIN — ketamine 500 mg in sodium chloride 0.9 % 250 mL (2 mg/mL): .25 mg/kg/h | INTRAVENOUS | @ 04:00:00 | Stop: 2023-02-22

## 2023-02-20 MED ADMIN — HYDROmorphone (DILAUDID) injection 2 mg: 2 mg | INTRAVENOUS | @ 07:00:00 | Stop: 2023-02-28

## 2023-02-20 MED ADMIN — HYDROmorphone (DILAUDID) injection 2 mg: 2 mg | INTRAVENOUS | @ 18:00:00 | Stop: 2023-02-28

## 2023-02-20 MED ADMIN — oxyCODONE (ROXICODONE) immediate release tablet 15 mg: 15 mg | ORAL | @ 23:00:00 | Stop: 2023-02-24

## 2023-02-20 MED ADMIN — HYDROmorphone (DILAUDID) injection 2 mg: 2 mg | INTRAVENOUS | @ 04:00:00 | Stop: 2023-02-28

## 2023-02-20 MED ADMIN — oxyCODONE (ROXICODONE) immediate release tablet 15 mg: 15 mg | ORAL | @ 17:00:00 | Stop: 2023-02-24

## 2023-02-20 MED ADMIN — oxyCODONE (ROXICODONE) immediate release tablet 15 mg: 15 mg | ORAL | @ 14:00:00 | Stop: 2023-02-24

## 2023-02-20 MED ADMIN — acetaminophen (TYLENOL) tablet 1,000 mg: 1000 mg | ORAL | @ 17:00:00

## 2023-02-20 MED ADMIN — oxyCODONE (ROXICODONE) immediate release tablet 15 mg: 15 mg | ORAL | @ 20:00:00 | Stop: 2023-02-24

## 2023-02-20 MED ADMIN — gabapentin (NEURONTIN) capsule 300 mg: 300 mg | ORAL | @ 02:00:00

## 2023-02-20 MED ADMIN — diazePAM (VALIUM) tablet 5 mg: 5 mg | ORAL | @ 02:00:00

## 2023-02-20 MED ADMIN — acetaminophen (TYLENOL) tablet 1,000 mg: 1000 mg | ORAL | @ 23:00:00

## 2023-02-20 MED ADMIN — HYDROmorphone (DILAUDID) injection 2 mg: 2 mg | INTRAVENOUS | @ 16:00:00 | Stop: 2023-02-28

## 2023-02-20 MED ADMIN — buprenorphine-naloxone (SUBOXONE) 2-0.5 mg SL film 2 mg of buprenorphine: 1 | SUBLINGUAL | @ 23:00:00

## 2023-02-20 MED ADMIN — lamoTRIgine (LaMICtal) tablet 200 mg: 200 mg | ORAL | @ 14:00:00

## 2023-02-20 MED ADMIN — acetaminophen (TYLENOL) tablet 1,000 mg: 1000 mg | ORAL | @ 04:00:00

## 2023-02-20 MED ADMIN — buprenorphine-naloxone (SUBOXONE) 2-0.5 mg SL film 2 mg of buprenorphine: 1 | SUBLINGUAL | @ 11:00:00

## 2023-02-20 MED ADMIN — enoxaparin (LOVENOX) syringe 40 mg: 40 mg | SUBCUTANEOUS | @ 14:00:00

## 2023-02-20 MED ADMIN — HYDROmorphone (DILAUDID) injection 2 mg: 2 mg | INTRAVENOUS | @ 10:00:00 | Stop: 2023-02-28

## 2023-02-20 MED ADMIN — acetaminophen (TYLENOL) tablet 1,000 mg: 1000 mg | ORAL | @ 11:00:00

## 2023-02-20 MED ADMIN — HYDROmorphone (DILAUDID) injection 2 mg: 2 mg | INTRAVENOUS | @ 13:00:00 | Stop: 2023-02-28

## 2023-02-20 MED ADMIN — oxyCODONE (ROXICODONE) immediate release tablet 15 mg: 15 mg | ORAL | @ 11:00:00 | Stop: 2023-02-24

## 2023-02-20 NOTE — Unmapped (Signed)
 Department of Anesthesiology  Pain Medicine Division    Chronic Pain Follow Up Inpatient Consult Note    Requesting Attending Physician:  Sharyn Lull, MD  Service Requesting Consult:  Henreitta Leber [facility])    The patient is a 43 y.o. male w/PMH Crohn's disease s/p previous ileocolic resections, pneumonia, admitted on 02/14/23, now POD#6 s/p laparoscopic partial colectomy with removal of terminal ileum and ileocolostomy on 02/14/23.     We were consulted for complex pain management.    Home Analgesic Medications:  Suboxone 8-2 mg SL BID  OTC APAP 1g PO q4h PRN    Interval Events: None    PRN Medication Requirements: IV HM x 7, PO oxycodone x 3    Vital Sign Trends: Vitally stable, afebrile.    Labs: No notable derangements (creatinine, LFTs for ketamine infusion, glucose levels)    -The chronic pain service is a consult service and does not place orders, just makes recommendations (except ketamine and lidocaine infusions)   -Please evaluate all patients on opioids for appropriateness of prescribing narcan at discharge.  The chronic pain service can assist with this.  Nasal narcan is covered by most insurances.  -Recommendations given apply to the current hospitalization and do not reflect long term recommendations.    -Continue APAP 1g PO q8h [facility]  -Continue Suboxone 2-0.5 mg SL TID  -Discontinue Hydromorphone PCA   -Continue hydromorphone 2 mg IV q3h PRN  -Continue oxycodone 15 mg po q6h [facility]  -Continue oxycodone 10-15 mg PO q6h PRN     -Patient is on a low dose ketamine infusion for pain              -Ketamine gtt #1 of 4 maximum over a 12 month period               -All ketamine orders will be managed by the [facility] Chronic Pain Service only-orders can only be placed by attendings and fellows              -Ketamine requires dedicated IV (PIV or central line lumen)              -Continue ketamine infusion: 0.25 mg/kg/hr Actual body weight (BMI <40-Actual BW, BMI >40-IBW)              -Patient location: Step down: dose can increase every 4 hours (can decrease anytime)              -Day 5 (max-7 days). Started (date): 02/15/23              -Prior to starting: Recent EKG, CMP (creatinine, LFTs), urine pregnancy test (if applicable)              -Daily CMP (creatinine and LFTs daily) while running              -Please contact the Chronic Pain service with any questions or concerns about ketamine infusion.    Home MME: N/A - on Suboxone  Current MME: 625.5> 538.5 today (+suboxone)  PCA: 11.4mg Derl Barrow    We will continue to follow.    Naloxone Rx at discharge?  Is patient on opioids? Yes.  1)Is dose >50MME?  Yes.  2) Is patient prescribed a benzodiazepine (w opioids)? No.  3)Hx of overdose?  No.  4) Hx of substance use disorder? Yes.  5) Opioids likely to last greater than a week after discharge? Yes.    If yes to 2 or more, prescribe naloxone at discharge.  Nasal narcan for most insured (Nasal narcan 4mg /actuation, prescribe 1 kit, instructions at [email or web address]).  For uninsured, chronic pain can work to assist in finding an option.  OTC nasal narcan now available at most pharmacies for around $45.    Interim History  There were no Acute Events Overnight.  The patient is obtaining adequate pain relief on current medication regimen and feels that their pain is Somewhat controlled    Pt states he's still hurting. States he couldn't remember talking about scheduling oxycodone yesterday. States he's a bit frustrated with his stay however glad he had ketamine and pain service. Concerned about going home Friday and what pain meds he will be discharged on. Follows with [facility] psychiatry for his suboxone and the day before he is discharged needs to make an appointment for a home zoom visit. Discussed discontinuing his PCA, however keeping the prn meds, since he's to be discharged in 2 days. He's not happy about this but states whatever you think. Encouraged to take pain meds prior to PT/OT visits.     Analgesia Evaluation:  Pain at minimum: 6/10  Pain at maximum: 8/10    Inpatient Medications  Current Facility-Administered Medications   Medication Dose Route Frequency Provider Last Rate Last Admin    acetaminophen (TYLENOL) tablet 1,000 mg  1,000 mg Oral Q6H SCH Emilio Aspen, MD   1,000 mg at 02/20/23 0623    albuterol (PROVENTIL HFA;VENTOLIN HFA) 90 mcg/actuation inhaler 2 puff  2 puff Inhalation Q4H PRN Emilio Aspen, MD        buprenorphine-naloxone (SUBOXONE) 2-0.5 mg SL film 2 mg of buprenorphine  1 Film Sublingual TID Dawson Bills, MD   2 mg of buprenorphine at 02/20/23 0622    calcium carbonate (TUMS) chewable tablet 400 mg elem calcium  400 mg elem calcium Oral TID PRN Rookard, Alaina A, PA   400 mg elem calcium at 02/15/23 1413    diazePAM (VALIUM) tablet 5 mg  5 mg Oral Q6H PRN Emilio Aspen, MD   5 mg at 02/20/23 0647    divalproex ER (DEPAKOTE ER) extended released 24 hr tablet 1,000 mg  1,000 mg Oral Nightly Lorre Nick, Edsel Petrin, MD/DMD   1,000 mg at 02/19/23 2107    enoxaparin (LOVENOX) syringe 40 mg  40 mg Subcutaneous Q24H SCH Emilio Aspen, MD   40 mg at 02/19/23 0941    gabapentin (NEURONTIN) capsule 300 mg  300 mg Oral TID Billey Chang, MD   300 mg at 02/19/23 2107    HYDROmorphone (DILAUDID) 50mg /66ml (1mg /ml) PCA CADD   Intravenous Continuous Nicholaus Corolla I, MD   Restarted at 02/19/23 1315    HYDROmorphone (PF) (DILAUDID) injection 1 mg  1 mg Intravenous Q3H PRN Zappas, Barbie Haggis, MD        Or    HYDROmorphone (DILAUDID) injection 2 mg  2 mg Intravenous Q3H PRN Zappas, Barbie Haggis, MD   2 mg at 02/20/23 1610    ketamine 500 mg in sodium chloride 0.9 % 250 mL (2 mg/mL)  0.25 mg/kg/hr Intravenous Continuous Karma Greaser, MD 8.55 mL/hr at 02/19/23 2235 0.25 mg/kg/hr at 02/19/23 2235    lactated Ringers infusion  10 mL/hr Intravenous Continuous Emilio Aspen, MD        lamoTRIgine (LaMICtal) tablet 200 mg  200 mg Oral Daily Emilio Aspen, MD   200 mg at 02/19/23 0941    nicotine (NICODERM CQ) 21 mg/24 hr patch 1 patch  1 patch  Transdermal Daily Rookard, Alaina A, PA   1 patch at 02/19/23 0940    nicotine polacrilex (NICORETTE) gum 4 mg  4 mg Buccal Q1H PRN Nicholaus Corolla I, MD        nicotine polacrilex (NICORETTE) lozenge 4 mg  4 mg Buccal Q1H PRN Nicholaus Corolla I, MD        ondansetron (ZOFRAN-ODT) disintegrating tablet 4 mg  4 mg Oral Q6H PRN Emilio Aspen, MD        Or    ondansetron [facility]) injection 4 mg  4 mg Intravenous Q6H PRN Emilio Aspen, MD   4 mg at 02/14/23 2259    oxyCODONE (ROXICODONE) immediate release tablet 10 mg  10 mg Oral Q6H PRN Nicholaus Corolla I, MD        Or    oxyCODONE (ROXICODONE) immediate release tablet 15 mg  15 mg Oral Q6H PRN Nicholaus Corolla I, MD   15 mg at 02/20/23 0036    oxyCODONE (ROXICODONE) immediate release tablet 15 mg  15 mg Oral Q6H Nicholaus Corolla I, MD   15 mg at 02/20/23 3474    sodium chloride (NS) 0.9 % infusion  10 mL/hr Intravenous Continuous Cloyde Reams D, DO 10 mL/hr at 02/15/23 1112 10 mL/hr at 02/15/23 1112     Objective:     Vital Signs    Temp:  [36.3 ??C (97.3 ??F)-36.7 ??C (98.1 ??F)] 36.7 ??C (98.1 ??F)  Heart Rate:  [75-87] 75  Resp:  [16-18] 16  BP: (116-134)/(54-74) 120/68  MAP (mmHg):  [74-90] 84  SpO2:  [96 %-99 %] 98 %    Physical Exam  GENERAL:  Well developed, well-nourished male and is in mild distress.   HEAD/NECK:    Reveals normocephalic/atraumatic.   CARDIOVASCULAR:   Warm, well perfused  LUNGS:   Normal work of breathing, no supplemental 02  EXTREMITIES:  Warm, no clubbing, cyanosis, or edema was noted.  NEUROLOGIC:    The patient was alert and oriented times four with normal language, attention, cognition and memory. MAEW. Cranial nerve exam was grossly normal.   MUSCULOSKELETAL:    No deformities.  SKIN:  No obvious rashes lesions or erythema  PSY:  Appropriate affect and mood.    Test Results    Lab Results   Component Value Date    CREATININE 0.72 (L) 02/20/2023     Lab Results   Component Value Date    ALKPHOS 119 (H) 02/20/2023    BILITOT 0.2 (L) 02/20/2023    BILIDIR <0.10 02/20/2023    PROT 5.7 02/20/2023    ALBUMIN 2.9 (L) 02/20/2023    ALT 47 02/20/2023    AST 44 (H) 02/20/2023     Problem List    Principal Problem:    Crohn's disease of colon with complication (CMS-HCC)  Active Problems:    Tobacco use disorder    Stark Klein, MD  Addiction Medicine Fellow

## 2023-02-20 NOTE — Unmapped (Signed)
Patient A&Ox4. Vitals stable. Incisions c/d/I. Pain 6-8/10 all shift, not controlled with current regimen. Ketamine drip in place, PCA in place. Ambulated around unit independently. Patient was ambulating in hallway when he started to feel dizzy/lightheaded, assisted patient back to room and back to bed. Adequate urine output. No BM this shift, patient states he is passing gas. Call light and bedside table within reach. Bed low and locked. Pt resting. Plan of care continued.    Goal: Absence of Infection Signs and Symptoms  Outcome: Progressing  Goal: Improved Oral Intake  Outcome: Progressing  Goal: Optimal Pain Control and Function  Outcome: Progressing  Goal: Skin Health and Integrity  Outcome: Progressing  Goal: Optimal Wound Healing  Outcome: Progressing     Problem: Adult Inpatient Plan of Care  Goal: Plan of Care Review  Outcome: Progressing  Goal: Patient-Specific Goal (Individualized)  Outcome: Progressing  Goal: Absence of Hospital-Acquired Illness or Injury  Outcome: Progressing  Goal: Optimal Comfort and Wellbeing  Outcome: Progressing  Goal: Readiness for Transition of Care  Outcome: Progressing  Goal: Rounds/Family Conference  Outcome: Progressing     Problem: Pain Acute  Goal: Optimal Pain Control and Function  Outcome: Progressing     Problem: Fall Injury Risk  Goal: Absence of Fall and Fall-Related Injury  Outcome: Progressing     Problem: Malnutrition  Goal: Improved Nutritional Intake  Outcome: Progressing

## 2023-02-20 NOTE — Unmapped (Signed)
VSS. Patient able to make his needs known. SBA with mobility, refused to walked with the rehab team this AM. Denies any N/V this shift, tolerating diet well. Adequate urine output. PCA was discontinued today. Pain is manage with scheduled Oxy, PRN Oxy & IV dilaudid, given several times this shift. Reported had a BM today. Call bell within reach. Will continue the plan of care.   Problem: Wound  Goal: Optimal Coping  Outcome: Progressing  Goal: Optimal Functional Ability  Outcome: Progressing  Goal: Absence of Infection Signs and Symptoms  Outcome: Progressing  Goal: Improved Oral Intake  Outcome: Progressing  Goal: Optimal Pain Control and Function  Outcome: Progressing  Goal: Skin Health and Integrity  Outcome: Progressing  Intervention: Optimize Skin Protection  Recent Flowsheet Documentation  Taken 02/20/2023 0845 by Lyndon Code D, RN  Pressure Reduction Techniques: frequent weight shift encouraged  Pressure Reduction Devices: pressure-redistributing mattress utilized  Skin Protection: incontinence pads utilized  Goal: Optimal Wound Healing  Outcome: Progressing     Problem: Adult Inpatient Plan of Care  Goal: Plan of Care Review  Outcome: Progressing  Goal: Patient-Specific Goal (Individualized)  Outcome: Progressing  Goal: Absence of Hospital-Acquired Illness or Injury  Outcome: Progressing  Intervention: Identify and Manage Fall Risk  Recent Flowsheet Documentation  Taken 02/20/2023 0845 by Lyndon Code D, RN  Safety Interventions:   lighting adjusted for tasks/safety   low bed   fall reduction program maintained  Intervention: Prevent Skin Injury  Recent Flowsheet Documentation  Taken 02/20/2023 0845 by Elio Forget, RN  Positioning for Skin: Supine/Back  Skin Protection: incontinence pads utilized  Goal: Optimal Comfort and Wellbeing  Outcome: Progressing  Goal: Readiness for Transition of Care  Outcome: Progressing  Goal: Rounds/Family Conference  Outcome: Progressing     Problem: Pain Acute  Goal: Optimal Pain Control and Function  Outcome: Progressing     Problem: Fall Injury Risk  Goal: Absence of Fall and Fall-Related Injury  Outcome: Progressing  Intervention: Promote Injury-Free Environment  Recent Flowsheet Documentation  Taken 02/20/2023 0845 by Lyndon Code D, RN  Safety Interventions:   lighting adjusted for tasks/safety   low bed   fall reduction program maintained     Problem: Malnutrition  Goal: Improved Nutritional Intake  Outcome: Progressing

## 2023-02-20 NOTE — Unmapped (Signed)
 Colorectal Surgery Progress Note    Today's Date: 02/20/2023  Admission Date: 02/14/2023  Length of Stay: Day 6, 6 Days Post-Op  Hospital Service: Henreitta Leber [facility])  Attending Physician: Sharyn Lull, MD     Assessment   Principal Problem:    Crohn's disease of colon with complication (CMS-HCC)  Active Problems:    Tobacco use disorder    Dakota Townsend is a 43 y.o. male with a medical history significant for Crohn's with several previous ileocolic resections. The patient is s/p laparoscopic partial colectomy with removal of terminal ileum with ileocolostomy on 2/6.    Subjective   NAEON. 3x BMs. Walking. Denies N/V. Tolerating regular diet.   Continue to work on weaning pain regimen with chronic pain team.     Plan   Neuro/Psych:  #Pain control  - SCH: PO tylenol 1000q6, gabapentin 300 TID  home buprenorphine, [id number]  - PRN: dPCA (0.2q15, max 3.2), IV dilaudid 1/2q3 (breakthrough only), Oxy10/15q6, valium 5q6  - Ketamine gtt (chronic pain managing)     CV/Pulm:  - HDS, oxygenating well on RA    FEN/GI:  F: ML  E: Replete PRN  N: Regular diet + protein supplements   - Strict I/O's    Renal/GU:  - Voiding spontaneously, UOP adequate     Heme/ID:  - Afebrile, WBC WNL  - Hgb 7.9 today, continue to monitor     Prophylaxis: prophylactic Lovenox, SCDs, out of bed/ambulate TID, IS, PPI  - Patient/family learning Lovenox injections for home  - Plan to continue Lovenox for 28 days from surgery in setting of inflammatory bowel disease history and the increased VTE risk    Access: peripheral IV    Dispo: floor status    Objective   Vital signs over the last 24 hours:  Temp:  [36.3 ??C (97.3 ??F)-36.7 ??C (98.1 ??F)] 36.7 ??C (98.1 ??F)  Heart Rate:  [75-87] 75  Resp:  [16-18] 16  BP: (116-134)/(54-74) 120/68  MAP (mmHg):  [74-90] 84  SpO2:  [96 %-99 %] 98 %    Wt Readings from Last 5 Encounters:   02/14/23 68.4 kg (150 lb 12.7 oz)   02/11/23 68.2 kg (150 lb 5.7 oz)   02/05/23 72.6 kg (160 lb)   01/29/23 72.6 kg (160 lb)   01/24/23 71.7 kg (158 lb)      Physical Exam:  Gen: alert, tired appearing, in no acute distress  CV: regular rate, normotensive  Pulm: breathing comfortably on room air  Abd: soft, nondistended, and diffusely tender to palpation. Surgical incisions approximated with Dermabond, clean and dry.  Ext: warm and well perfused bilaterally, no edema    Nicholaus Corolla, MD  PGY-1 Vascular Surgery

## 2023-02-21 LAB — BASIC METABOLIC PANEL
ANION GAP: 10 mmol/L (ref 5–14)
BLOOD UREA NITROGEN: 8 mg/dL — ABNORMAL LOW (ref 9–23)
BUN / CREAT RATIO: 11
CALCIUM: 8.5 mg/dL — ABNORMAL LOW (ref 8.7–10.4)
CHLORIDE: 105 mmol/L (ref 98–107)
CO2: 27 mmol/L (ref 20.0–31.0)
CREATININE: 0.75 mg/dL (ref 0.73–1.18)
EGFR CKD-EPI (2021) MALE: >90 mL/min/{1.73_m2} (ref >=60)
GLUCOSE RANDOM: 89 mg/dL (ref 70–179)
POTASSIUM: 4.4 mmol/L (ref 3.4–4.8)
SODIUM: 142 mmol/L (ref 135–145)

## 2023-02-21 LAB — HEPATIC FUNCTION PANEL
ALBUMIN: 2.8 g/dL — ABNORMAL LOW (ref 3.4–5.0)
ALKALINE PHOSPHATASE: 136 U/L — ABNORMAL HIGH (ref 46–116)
ALT (SGPT): 54 U/L — ABNORMAL HIGH (ref 10–49)
AST (SGOT): 47 U/L — ABNORMAL HIGH (ref <=34)
BILIRUBIN DIRECT: <0.1 mg/dL (ref 0.00–0.30)
BILIRUBIN TOTAL: <0.2 mg/dL — ABNORMAL LOW (ref 0.3–1.2)
PROTEIN TOTAL: 5.6 g/dL — ABNORMAL LOW (ref 5.7–8.2)

## 2023-02-21 MED ADMIN — oxyCODONE (ROXICODONE) immediate release tablet 15 mg: 15 mg | ORAL | @ 05:00:00 | Stop: 2023-02-24

## 2023-02-21 MED ADMIN — acetaminophen (TYLENOL) tablet 1,000 mg: 1000 mg | ORAL | @ 05:00:00

## 2023-02-21 MED ADMIN — oxyCODONE (ROXICODONE) immediate release tablet 15 mg: 15 mg | ORAL | @ 14:00:00 | Stop: 2023-02-24

## 2023-02-21 MED ADMIN — buprenorphine-naloxone (SUBOXONE) 2-0.5 mg SL film 2 mg of buprenorphine: 1 | SUBLINGUAL | @ 17:00:00

## 2023-02-21 MED ADMIN — gabapentin (NEURONTIN) capsule 300 mg: 300 mg | ORAL | @ 13:00:00

## 2023-02-21 MED ADMIN — buprenorphine-naloxone (SUBOXONE) 2-0.5 mg SL film 2 mg of buprenorphine: 1 | SUBLINGUAL | @ 23:00:00

## 2023-02-21 MED ADMIN — HYDROmorphone (DILAUDID) injection 2 mg: 2 mg | INTRAVENOUS | @ 13:00:00 | Stop: 2023-02-21

## 2023-02-21 MED ADMIN — nicotine (NICODERM CQ) 21 mg/24 hr patch 1 patch: 1 | TRANSDERMAL | @ 13:00:00

## 2023-02-21 MED ADMIN — lamoTRIgine (LaMICtal) tablet 200 mg: 200 mg | ORAL | @ 13:00:00

## 2023-02-21 MED ADMIN — oxyCODONE (ROXICODONE) immediate release tablet 15 mg: 15 mg | ORAL | @ 23:00:00 | Stop: 2023-02-24

## 2023-02-21 MED ADMIN — acetaminophen (TYLENOL) tablet 1,000 mg: 1000 mg | ORAL | @ 17:00:00

## 2023-02-21 MED ADMIN — HYDROmorphone (DILAUDID) injection 2 mg: 2 mg | INTRAVENOUS | @ 04:00:00 | Stop: 2023-02-28

## 2023-02-21 MED ADMIN — HYDROmorphone (PF) (DILAUDID) injection 1 mg: 1 mg | INTRAVENOUS | Stop: 2023-02-28

## 2023-02-21 MED ADMIN — diazePAM (VALIUM) tablet 5 mg: 5 mg | ORAL | @ 17:00:00

## 2023-02-21 MED ADMIN — diazePAM (VALIUM) tablet 5 mg: 5 mg | ORAL

## 2023-02-21 MED ADMIN — buprenorphine-naloxone (SUBOXONE) 2-0.5 mg SL film 2 mg of buprenorphine: 1 | SUBLINGUAL | @ 11:00:00

## 2023-02-21 MED ADMIN — acetaminophen (TYLENOL) tablet 1,000 mg: 1000 mg | ORAL | @ 11:00:00

## 2023-02-21 MED ADMIN — oxyCODONE (ROXICODONE) immediate release tablet 15 mg: 15 mg | ORAL | @ 02:00:00 | Stop: 2023-02-24

## 2023-02-21 MED ADMIN — HYDROmorphone (DILAUDID) injection 2 mg: 2 mg | INTRAVENOUS | @ 07:00:00 | Stop: 2023-02-21

## 2023-02-21 MED ADMIN — HYDROmorphone (DILAUDID) injection 2 mg: 2 mg | INTRAVENOUS | @ 01:00:00 | Stop: 2023-02-28

## 2023-02-21 MED ADMIN — HYDROmorphone (DILAUDID) injection 2 mg: 2 mg | INTRAVENOUS | @ 10:00:00 | Stop: 2023-02-21

## 2023-02-21 MED ADMIN — oxyCODONE (ROXICODONE) immediate release tablet 15 mg: 15 mg | ORAL | @ 17:00:00 | Stop: 2023-02-24

## 2023-02-21 MED ADMIN — ketamine 500 mg in sodium chloride 0.9 % 250 mL (2 mg/mL): .25 mg/kg/h | INTRAVENOUS | @ 05:00:00 | Stop: 2023-02-22

## 2023-02-21 MED ADMIN — oxyCODONE (ROXICODONE) immediate release tablet 15 mg: 15 mg | ORAL | @ 08:00:00 | Stop: 2023-02-24

## 2023-02-21 MED ADMIN — oxyCODONE (ROXICODONE) immediate release tablet 15 mg: 15 mg | ORAL | @ 11:00:00 | Stop: 2023-02-24

## 2023-02-21 MED ADMIN — diazePAM (VALIUM) tablet 5 mg: 5 mg | ORAL | @ 11:00:00

## 2023-02-21 MED ADMIN — gabapentin (NEURONTIN) capsule 300 mg: 300 mg | ORAL | @ 20:00:00

## 2023-02-21 MED ADMIN — gabapentin (NEURONTIN) capsule 300 mg: 300 mg | ORAL | @ 02:00:00

## 2023-02-21 MED ADMIN — divalproex ER (DEPAKOTE ER) extended released 24 hr tablet 1,000 mg: 1000 mg | ORAL | @ 02:00:00

## 2023-02-21 MED ADMIN — acetaminophen (TYLENOL) tablet 1,000 mg: 1000 mg | ORAL | @ 23:00:00

## 2023-02-21 MED ADMIN — oxyCODONE (ROXICODONE) immediate release tablet 15 mg: 15 mg | ORAL | @ 21:00:00 | Stop: 2023-02-24

## 2023-02-21 MED ADMIN — HYDROmorphone (PF) (DILAUDID) injection 1 mg: 1 mg | INTRAVENOUS | @ 20:00:00 | Stop: 2023-02-28

## 2023-02-21 MED ADMIN — HYDROmorphone (DILAUDID) injection 2 mg: 2 mg | INTRAVENOUS | @ 17:00:00 | Stop: 2023-02-21

## 2023-02-21 MED ADMIN — diazePAM (VALIUM) tablet 5 mg: 5 mg | ORAL | @ 05:00:00

## 2023-02-21 MED ADMIN — enoxaparin (LOVENOX) syringe 40 mg: 40 mg | SUBCUTANEOUS | @ 13:00:00

## 2023-02-21 NOTE — Unmapped (Signed)
A & O x 4. Able to make his needs known. Pain is manage with scheduled Oxy, continuous Ketamine, PRN Oxy, IV dilaudid, PRN given several times this shift. Adequate urine output this shift. ABD incision is intact. Tolerating diet, no N/V reported this shift. VS are WNL. Call bell within reach. Family at bedside.   Problem: Wound  Goal: Optimal Coping  Outcome: Progressing  Goal: Optimal Functional Ability  Outcome: Progressing  Goal: Absence of Infection Signs and Symptoms  Outcome: Progressing  Goal: Improved Oral Intake  Outcome: Progressing  Goal: Optimal Pain Control and Function  Outcome: Progressing  Goal: Skin Health and Integrity  Outcome: Progressing  Intervention: Optimize Skin Protection  Recent Flowsheet Documentation  Taken 02/21/2023 0800 by Lyndon Code D, RN  Pressure Reduction Techniques: frequent weight shift encouraged  Pressure Reduction Devices: pressure-redistributing mattress utilized  Skin Protection: incontinence pads utilized  Goal: Optimal Wound Healing  Outcome: Progressing     Problem: Adult Inpatient Plan of Care  Goal: Plan of Care Review  Outcome: Progressing  Goal: Patient-Specific Goal (Individualized)  Outcome: Progressing  Goal: Absence of Hospital-Acquired Illness or Injury  Outcome: Progressing  Intervention: Identify and Manage Fall Risk  Recent Flowsheet Documentation  Taken 02/21/2023 0800 by Lyndon Code D, RN  Safety Interventions:   fall reduction program maintained   low bed   lighting adjusted for tasks/safety  Intervention: Prevent Skin Injury  Recent Flowsheet Documentation  Taken 02/21/2023 0800 by Elio Forget, RN  Positioning for Skin: Supine/Back  Skin Protection: incontinence pads utilized  Goal: Optimal Comfort and Wellbeing  Outcome: Progressing  Goal: Readiness for Transition of Care  Outcome: Progressing  Goal: Rounds/Family Conference  Outcome: Progressing     Problem: Fall Injury Risk  Goal: Absence of Fall and Fall-Related Injury  Outcome: Progressing  Intervention: Promote Scientist, clinical (histocompatibility and immunogenetics) Documentation  Taken 02/21/2023 0800 by Lyndon Code D, RN  Safety Interventions:   fall reduction program maintained   low bed   lighting adjusted for tasks/safety     Problem: Pain Acute  Goal: Optimal Pain Control and Function  Outcome: Progressing     Problem: Malnutrition  Goal: Improved Nutritional Intake  Outcome: Progressing

## 2023-02-21 NOTE — Unmapped (Signed)
 Pain Psychology Contact Note:        Patient: Dakota Townsend ([id number])   Supervising Clinical Psychologist: Caroline More, PhD  Psychology Intern: Barbette Or, MA   Date: February 21, 2023   Time: 20 minutes   Time of Day: 11:30 AM  Method: Inpatient face-to-face      Mr. Ekholm is known to [place] from referral for brief consult from the IPS team. The intern met with Mr. Molina today inpatient to discuss pain coping and ongoing anxiety.      Objective and Behavioral Observations:   Mr. Lortie was laying in bed when the psychology intern entered the room with the IPS fellow. Mr. Boardley was agreeable to meeting for a brief session today. Mr. Rodkey reported ongoing anxiety specific to upcoming discharge home and medication changes. Specifically, Mr. Mckey reported that he does not understand why he is not being given a longer prescription for Oxycodone for pain management after discharge. He described that, after previous surgeries 11 years ago, he was given up to a month of pain medication post-op. Mr. Haag reported that he would like to discharge to the [facility] house prior to going home as he feels he will leave with poorly controlled pain.     Mr. Miklos did express agreement with current discharge plan from the hospital (possibly Saturday) and medication changes despite ongoing frustration. He reported that he currently sees a counselor and psychiatrist for ongoing MH concerns and medication management. He plans to meet with his counselor today at 2pm virtually to discuss his emotions around discharge. We discussed possible coping strategies for pain upon returning home including continuing to walk as he can and participating in previously enjoyable activities (e.g., reading philosophy books and Manga). The benefit of returning home sooner with more resources for improving coping was highlighted to Mr. Tillis. He was also encouraged to continue to see his local MH counselor for ongoing MH concerns. His father returned the room shortly after beginning the visit, and Mr. Umar did not wish to continue.     Supportive counseling and CBT were employed during the visit. We discussed coping with ongoing post-op pain, and he was given space to vocalize frustration with medication changes. The benefits of discharge home specific to pain management were highlighted to Mr. Hagedorn. Mr. Zumbro plans to utilize discussed coping strategies upon returning home, and he will meet with his current [facility] provider today.     Mental Status Exam:   Appearance: No concerns noted   Motor: No concerns noted   Speech/language: No concerns noted     Assessment:   Mr. Bertoli was responsive throughout the entirety of the session but made minimal eye contact. He appeared frustrated with congruent affect. He was willing to discuss ongoing anxiety around discharge and medication adjustments. He denied SI/HI. He plans to utilize identified coping strategies and will meet with his current therapist today.     Plan: No follow-up necessary. Mr. Buys is currently followed by an outpatient therapist and psychiatrist for [facility] concerns and pain management.      Barbette Or, MA   Psychology Predoctoral Intern      Caroline More, PhD

## 2023-02-21 NOTE — Unmapped (Signed)
 Department of Anesthesiology  Pain Medicine Division    Chronic Pain Follow Up Inpatient Consult Note    Requesting Attending Physician:  Sharyn Lull, MD  Service Requesting Consult:  Henreitta Leber [facility])    The patient is a 43 y.o. male w/PMH Crohn's disease s/p previous ileocolic resections, pneumonia, admitted on 02/14/23, now POD#7 s/p laparoscopic partial colectomy with removal of terminal ileum and ileocolostomy on 02/14/23.     We were consulted for complex pain management.    Home Analgesic Medications:  Suboxone 8-2 mg SL BID  OTC APAP 1g PO q4h PRN    Interval Events: None    PRN Medication Requirements: IV HM x 7, PO oxycodone x 3    Vital Sign Trends: Vitally stable, afebrile.    Labs: No notable derangements , LFTs min elevated    -The chronic pain service is a consult service and does not place orders, just makes recommendations (except ketamine and lidocaine infusions)   -Please evaluate all patients on opioids for appropriateness of prescribing narcan at discharge.  The chronic pain service can assist with this.  Nasal narcan is covered by most insurances.  -Recommendations given apply to the current hospitalization and do not reflect long term recommendations.    -Continue APAP 1g PO q8h [facility]  -Continue Suboxone 2-0.5 mg SL TID  -Decrease hydromorphone to 1 mg IV q3h PRN  -Continue oxycodone 15 mg po q6h [facility]  -Continue oxycodone 10-15 mg PO q6h PRN     -Patient is on a low dose ketamine infusion for pain              -Ketamine gtt #1 of 4 maximum over a 12 month period               -All ketamine orders will be managed by the [facility] Chronic Pain Service only-orders can only be placed by attendings and fellows              -Ketamine requires dedicated IV (PIV or central line lumen)              -Continue ketamine infusion: 0.25 mg/kg/hr Actual body weight (BMI <40-Actual BW, BMI >40-IBW)              -Patient location: Step down: dose can increase every 4 hours (can decrease anytime) -Day 6 (max-7 days). Started (date): 02/15/23              -Prior to starting: Recent EKG, CMP (creatinine, LFTs), urine pregnancy test (if applicable)              -Daily CMP (creatinine and LFTs daily) while running              -Please contact the Chronic Pain service with any questions or concerns about ketamine infusion.    Home MME: N/A - on Suboxone  Current MME: 538.5> 420 today (+suboxone)      We will continue to follow.    Naloxone Rx at discharge?  Is patient on opioids? Yes.  1)Is dose >50MME?  Yes.  2) Is patient prescribed a benzodiazepine (w opioids)? No.  3)Hx of overdose?  No.  4) Hx of substance use disorder? Yes.  5) Opioids likely to last greater than a week after discharge? Yes.    If yes to 2 or more, prescribe naloxone at discharge.  Nasal narcan for most insured (Nasal narcan 4mg /actuation, prescribe 1 kit, instructions at [email or web address]).  For uninsured, chronic pain can work  to assist in finding an option.  OTC nasal narcan now available at most pharmacies for around $45.    Interim History  There were no Acute Events Overnight.  The patient is obtaining adequate pain relief on current medication regimen and feels that their pain is Somewhat controlled    Pt states the pain is worse with cramping and he has had 8 BM yesterday. States he's trying to bear through the pain. Understands that he needs to get off the IV meds. Discussed trying to use oral over IV if possible. Agrees to speak with psychology intern.     Analgesia Evaluation:  Pain at minimum: 6/10  Pain at maximum: 8/10    Inpatient Medications  Current Facility-Administered Medications   Medication Dose Route Frequency Provider Last Rate Last Admin    acetaminophen (TYLENOL) tablet 1,000 mg  1,000 mg Oral Q6H SCH Emilio Aspen, MD   1,000 mg at 02/21/23 0628    albuterol (PROVENTIL HFA;VENTOLIN HFA) 90 mcg/actuation inhaler 2 puff  2 puff Inhalation Q4H PRN Emilio Aspen, MD        buprenorphine-naloxone (SUBOXONE) 2-0.5 mg SL film 2 mg of buprenorphine  1 Film Sublingual TID Dawson Bills, MD   2 mg of buprenorphine at 02/21/23 1610    calcium carbonate (TUMS) chewable tablet 400 mg elem calcium  400 mg elem calcium Oral TID PRN Rookard, Alaina A, PA   400 mg elem calcium at 02/15/23 1413    diazePAM (VALIUM) tablet 5 mg  5 mg Oral Q6H PRN Emilio Aspen, MD   5 mg at 02/21/23 9604    divalproex ER (DEPAKOTE ER) extended released 24 hr tablet 1,000 mg  1,000 mg Oral Nightly Lorre Nick, Edsel Petrin, MD/DMD   1,000 mg at 02/20/23 2108    enoxaparin (LOVENOX) syringe 40 mg  40 mg Subcutaneous Q24H SCH Emilio Aspen, MD   40 mg at 02/21/23 5409    gabapentin (NEURONTIN) capsule 300 mg  300 mg Oral TID Billey Chang, MD   300 mg at 02/21/23 8119    HYDROmorphone (PF) (DILAUDID) injection 1 mg  1 mg Intravenous Q3H PRN Zappas, Barbie Haggis, MD        Or    HYDROmorphone (DILAUDID) injection 2 mg  2 mg Intravenous Q3H PRN Zappas, Barbie Haggis, MD   2 mg at 02/21/23 0813    ketamine 500 mg in sodium chloride 0.9 % 250 mL (2 mg/mL)  0.25 mg/kg/hr Intravenous Continuous Karma Greaser, MD 8.55 mL/hr at 02/21/23 0010 0.25 mg/kg/hr at 02/21/23 0010    lactated Ringers infusion  10 mL/hr Intravenous Continuous Emilio Aspen, MD        lamoTRIgine (LaMICtal) tablet 200 mg  200 mg Oral Daily Emilio Aspen, MD   200 mg at 02/21/23 0816    nicotine (NICODERM CQ) 21 mg/24 hr patch 1 patch  1 patch Transdermal Daily Rookard, Alaina A, PA   1 patch at 02/21/23 1478    nicotine polacrilex (NICORETTE) gum 4 mg  4 mg Buccal Q1H PRN Nicholaus Corolla I, MD        nicotine polacrilex (NICORETTE) lozenge 4 mg  4 mg Buccal Q1H PRN Nicholaus Corolla I, MD        ondansetron (ZOFRAN-ODT) disintegrating tablet 4 mg  4 mg Oral Q6H PRN Emilio Aspen, MD        Or    ondansetron [facility]) injection 4 mg  4 mg Intravenous Q6H PRN  Emilio Aspen, MD   4 mg at 02/14/23 2259    oxyCODONE (ROXICODONE) immediate release tablet 10 mg  10 mg Oral Q6H PRN Nicholaus Corolla I, MD        Or    oxyCODONE (ROXICODONE) immediate release tablet 15 mg  15 mg Oral Q6H PRN Nicholaus Corolla I, MD   15 mg at 02/21/23 0981    oxyCODONE (ROXICODONE) immediate release tablet 15 mg  15 mg Oral Q6H Nicholaus Corolla I, MD   15 mg at 02/21/23 1914    sodium chloride (NS) 0.9 % infusion  10 mL/hr Intravenous Continuous Cloyde Reams D, DO 10 mL/hr at 02/15/23 1112 10 mL/hr at 02/15/23 1112     Objective:     Vital Signs    Temp:  [36.2 ??C (97.2 ??F)-36.8 ??C (98.2 ??F)] 36.8 ??C (98.2 ??F)  Heart Rate:  [76-81] 79  Resp:  [16-21] 16  BP: (102-134)/(54-81) 102/59  MAP (mmHg):  [70-95] 72  SpO2:  [95 %-100 %] 96 %    Physical Exam  GENERAL:  Well developed, well-nourished male and is in mild distress.   HEAD/NECK:    Reveals normocephalic/atraumatic.   CARDIOVASCULAR:   Warm, well perfused  LUNGS:   Normal work of breathing, no supplemental 02  EXTREMITIES:  Warm, no clubbing, cyanosis, or edema was noted.  NEUROLOGIC:    The patient was alert and oriented times four with normal language, attention, cognition and memory. MAEW. Cranial nerve exam was grossly normal.   MUSCULOSKELETAL:    No deformities.  SKIN:  No obvious rashes lesions or erythema  PSY:  Appropriate affect and mood.    Test Results    Lab Results   Component Value Date    CREATININE 0.72 (L) 02/20/2023     Lab Results   Component Value Date    ALKPHOS 136 (H) 02/21/2023    BILITOT <0.2 (L) 02/21/2023    BILIDIR <0.10 02/21/2023    PROT 5.6 (L) 02/21/2023    ALBUMIN 2.8 (L) 02/21/2023    ALT 54 (H) 02/21/2023    AST 47 (H) 02/21/2023     Problem List    Principal Problem:    Crohn's disease of colon with complication (CMS-HCC)  Active Problems:    Tobacco use disorder    Stark Klein, MD  Addiction Medicine Fellow

## 2023-02-21 NOTE — Unmapped (Signed)
Patient A&Ox4. Vitals stable. Incisions c/d/I. Pain 8/10 all shift, somewhat controlled with current regimen. Adequate urine output. 2 BMs this shift. Ambulated around unit independently. Call light and bedside table within reach. Bed low and locked. Pt resting. Plan of care continued.        Problem: Wound  Goal: Optimal Coping  Outcome: Progressing  Goal: Optimal Functional Ability  Outcome: Progressing  Goal: Absence of Infection Signs and Symptoms  Outcome: Progressing  Goal: Improved Oral Intake  Outcome: Progressing  Goal: Optimal Pain Control and Function  Outcome: Progressing  Goal: Skin Health and Integrity  Outcome: Progressing  Goal: Optimal Wound Healing  Outcome: Progressing     Problem: Adult Inpatient Plan of Care  Goal: Plan of Care Review  Outcome: Progressing  Goal: Patient-Specific Goal (Individualized)  Outcome: Progressing  Goal: Absence of Hospital-Acquired Illness or Injury  Outcome: Progressing  Goal: Optimal Comfort and Wellbeing  Outcome: Progressing  Goal: Readiness for Transition of Care  Outcome: Progressing  Goal: Rounds/Family Conference  Outcome: Progressing     Problem: Pain Acute  Goal: Optimal Pain Control and Function  Outcome: Progressing     Problem: Fall Injury Risk  Goal: Absence of Fall and Fall-Related Injury  Outcome: Progressing     Problem: Malnutrition  Goal: Improved Nutritional Intake  Outcome: Progressing

## 2023-02-21 NOTE — Unmapped (Signed)
 Colorectal Surgery Progress Note    Today's Date: 02/21/2023  Admission Date: 02/14/2023  Length of Stay: Day 7, 7 Days Post-Op  Hospital Service: Henreitta Leber [facility])  Attending Physician: Sharyn Lull, MD     Assessment   Principal Problem:    Crohn's disease of colon with complication (CMS-HCC)  Active Problems:    Tobacco use disorder    Dakota Townsend is a 43 y.o. male with a medical history significant for Crohn's with several previous ileocolic resections. The patient is s/p laparoscopic partial colectomy with removal of terminal ileum with ileocolostomy on 2/6.    Subjective   NAEON. Drowsy during AM rounds. Discussed with patient need to make an appointment with his pain clinic on day of discharge.   3x BMs. Walking. Denies N/V. Tolerating regular diet.   Continue to work on weaning pain regimen with chronic pain team.     Plan   Neuro/Psych:  #Pain control  - SCH: PO tylenol 1000q6, gabapentin 300 TID  home buprenorphine, [id number]  - PRN: IV dilaudid 1/2q3 (breakthrough only), Oxy10/15q6, valium 5q6  - Ketamine gtt (chronic pain managing)    - Daily CMP needed    CV/Pulm:  - HDS, oxygenating well on RA    FEN/GI:  F: ML  E: Replete PRN  N: Regular diet + protein supplements   - Strict I/O's    Renal/GU:  - Voiding spontaneously, UOP adequate     Heme/ID:  - Afebrile, WBC WNL  - Hgb 7.9 today, continue to monitor     Prophylaxis: prophylactic Lovenox, SCDs, out of bed/ambulate TID, IS, PPI  - Patient/family learning Lovenox injections for home  - Plan to continue Lovenox for 28 days from surgery in setting of inflammatory bowel disease history and the increased VTE risk    Access: peripheral IV    Dispo: floor status    Objective   Vital signs over the last 24 hours:  Temp:  [36.2 ??C (97.2 ??F)-36.8 ??C (98.2 ??F)] 36.8 ??C (98.2 ??F)  Heart Rate:  [76-81] 79  Resp:  [16-21] 16  BP: (102-134)/(54-81) 102/59  MAP (mmHg):  [70-95] 72  SpO2:  [95 %-100 %] 96 %    Wt Readings from Last 5 Encounters: 02/14/23 68.4 kg (150 lb 12.7 oz)   02/11/23 68.2 kg (150 lb 5.7 oz)   02/05/23 72.6 kg (160 lb)   01/29/23 72.6 kg (160 lb)   01/24/23 71.7 kg (158 lb)      Physical Exam:  Gen: alert, tired appearing, in no acute distress  CV: regular rate, normotensive  Pulm: breathing comfortably on room air  Abd: soft, nondistended, and diffusely tender to palpation. Surgical incisions approximated with Dermabond, clean and dry.  Ext: warm and well perfused bilaterally, no edema    Dakota Corolla, MD  PGY-1 Vascular Surgery

## 2023-02-22 LAB — CBC
HEMATOCRIT: 25.5 % — ABNORMAL LOW (ref 39.0–48.0)
HEMOGLOBIN: 8.5 g/dL — ABNORMAL LOW (ref 12.9–16.5)
MEAN CORPUSCULAR HEMOGLOBIN CONC: 33.2 g/dL (ref 32.0–36.0)
MEAN CORPUSCULAR HEMOGLOBIN: 30.4 pg (ref 25.9–32.4)
MEAN CORPUSCULAR VOLUME: 91.6 fL (ref 77.6–95.7)
MEAN PLATELET VOLUME: 8.8 fL (ref 6.8–10.7)
PLATELET COUNT: 376 10*9/L (ref 150–450)
RED BLOOD CELL COUNT: 2.78 10*12/L — ABNORMAL LOW (ref 4.26–5.60)
RED CELL DISTRIBUTION WIDTH: 13.4 % (ref 12.2–15.2)
WBC ADJUSTED: 7.3 10*9/L (ref 3.6–11.2)

## 2023-02-22 LAB — BASIC METABOLIC PANEL
ANION GAP: 13 mmol/L (ref 5–14)
BLOOD UREA NITROGEN: 11 mg/dL (ref 9–23)
BUN / CREAT RATIO: 11
CALCIUM: 8.7 mg/dL (ref 8.7–10.4)
CHLORIDE: 103 mmol/L (ref 98–107)
CO2: 24 mmol/L (ref 20.0–31.0)
CREATININE: 0.99 mg/dL (ref 0.73–1.18)
EGFR CKD-EPI (2021) MALE: >90 mL/min/{1.73_m2} (ref >=60)
GLUCOSE RANDOM: 104 mg/dL (ref 70–179)
POTASSIUM: 4.2 mmol/L (ref 3.4–4.8)
SODIUM: 140 mmol/L (ref 135–145)

## 2023-02-22 LAB — HEPATIC FUNCTION PANEL
ALBUMIN: 3.1 g/dL — ABNORMAL LOW (ref 3.4–5.0)
ALKALINE PHOSPHATASE: 161 U/L — ABNORMAL HIGH (ref 46–116)
ALT (SGPT): 59 U/L — ABNORMAL HIGH (ref 10–49)
AST (SGOT): 44 U/L — ABNORMAL HIGH (ref <=34)
BILIRUBIN DIRECT: <0.1 mg/dL (ref 0.00–0.30)
BILIRUBIN TOTAL: <0.2 mg/dL — ABNORMAL LOW (ref 0.3–1.2)
PROTEIN TOTAL: 6 g/dL (ref 5.7–8.2)

## 2023-02-22 LAB — MAGNESIUM: MAGNESIUM: 1.7 mg/dL (ref 1.6–2.6)

## 2023-02-22 LAB — PHOSPHORUS: PHOSPHORUS: 5.1 mg/dL (ref 2.4–5.1)

## 2023-02-22 MED ORDER — ONDANSETRON 4 MG DISINTEGRATING TABLET
ORAL_TABLET | Freq: Three times a day (TID) | 5 refills | 4.00 days | PRN
Start: 2023-02-22 — End: ?

## 2023-02-22 MED ORDER — NICOTINE (POLACRILEX) 4 MG GUM
BUCCAL | 0 refills | 5.00 days | PRN
Start: 2023-02-22 — End: 2023-03-24

## 2023-02-22 MED ORDER — NICOTINE 21 MG/24 HR DAILY TRANSDERMAL PATCH
MEDICATED_PATCH | TRANSDERMAL | 0 refills | 28.00 days
Start: 2023-02-22 — End: ?

## 2023-02-22 MED ORDER — NICOTINE (POLACRILEX) 4 MG BUCCAL LOZENGE
BUCCAL | 0 refills | 5.00 days | PRN
Start: 2023-02-22 — End: 2023-03-24

## 2023-02-22 MED ORDER — ACETAMINOPHEN 500 MG TABLET
ORAL_TABLET | Freq: Four times a day (QID) | ORAL | 0 refills | 10.00 days
Start: 2023-02-22 — End: 2023-03-04

## 2023-02-22 MED ADMIN — diazePAM (VALIUM) tablet 5 mg: 5 mg | ORAL | @ 22:00:00

## 2023-02-22 MED ADMIN — divalproex ER (DEPAKOTE ER) extended released 24 hr tablet 1,000 mg: 1000 mg | ORAL | @ 01:00:00

## 2023-02-22 MED ADMIN — oxyCODONE (ROXICODONE) immediate release tablet 15 mg: 15 mg | ORAL | @ 03:00:00 | Stop: 2023-02-24

## 2023-02-22 MED ADMIN — oxyCODONE (ROXICODONE) immediate release tablet 15 mg: 15 mg | ORAL | @ 23:00:00 | Stop: 2023-02-27

## 2023-02-22 MED ADMIN — buprenorphine-naloxone (SUBOXONE) 2-0.5 mg SL film 2 mg of buprenorphine: 1 | SUBLINGUAL | @ 11:00:00

## 2023-02-22 MED ADMIN — oxyCODONE (ROXICODONE) immediate release tablet 15 mg: 15 mg | ORAL | @ 12:00:00 | Stop: 2023-02-22

## 2023-02-22 MED ADMIN — acetaminophen (TYLENOL) tablet 1,000 mg: 1000 mg | ORAL | @ 05:00:00

## 2023-02-22 MED ADMIN — enoxaparin (LOVENOX) syringe 40 mg: 40 mg | SUBCUTANEOUS | @ 15:00:00

## 2023-02-22 MED ADMIN — gabapentin (NEURONTIN) capsule 300 mg: 300 mg | ORAL | @ 03:00:00

## 2023-02-22 MED ADMIN — acetaminophen (TYLENOL) tablet 1,000 mg: 1000 mg | ORAL | @ 12:00:00

## 2023-02-22 MED ADMIN — lamoTRIgine (LaMICtal) tablet 200 mg: 200 mg | ORAL | @ 15:00:00

## 2023-02-22 MED ADMIN — oxyCODONE (ROXICODONE) immediate release tablet 20 mg: 20 mg | ORAL | @ 21:00:00 | Stop: 2023-02-27

## 2023-02-22 MED ADMIN — HYDROmorphone (PF) (DILAUDID) injection 1 mg: 1 mg | INTRAVENOUS | @ 06:00:00 | Stop: 2023-02-22

## 2023-02-22 MED ADMIN — ketamine 500 mg in sodium chloride 0.9 % 250 mL (2 mg/mL): .25 mg/kg/h | INTRAVENOUS | @ 13:00:00 | Stop: 2023-02-22

## 2023-02-22 MED ADMIN — buprenorphine-naloxone (SUBOXONE) 2-0.5 mg SL film 2 mg of buprenorphine: 1 | SUBLINGUAL | @ 23:00:00

## 2023-02-22 MED ADMIN — diazePAM (VALIUM) tablet 5 mg: 5 mg | ORAL | @ 06:00:00 | Stop: 2023-02-22

## 2023-02-22 MED ADMIN — diazePAM (VALIUM) tablet 5 mg: 5 mg | ORAL | @ 16:00:00

## 2023-02-22 MED ADMIN — acetaminophen (TYLENOL) tablet 1,000 mg: 1000 mg | ORAL | @ 23:00:00

## 2023-02-22 MED ADMIN — HYDROmorphone (PF) (DILAUDID) injection 1 mg: 1 mg | INTRAVENOUS | @ 09:00:00 | Stop: 2023-02-22

## 2023-02-22 MED ADMIN — oxyCODONE (ROXICODONE) immediate release tablet 15 mg: 15 mg | ORAL | @ 15:00:00 | Stop: 2023-02-27

## 2023-02-22 MED ADMIN — nicotine (NICODERM CQ) 21 mg/24 hr patch 1 patch: 1 | TRANSDERMAL | @ 15:00:00

## 2023-02-22 MED ADMIN — gabapentin (NEURONTIN) capsule 300 mg: 300 mg | ORAL | @ 21:00:00

## 2023-02-22 MED ADMIN — gabapentin (NEURONTIN) capsule 300 mg: 300 mg | ORAL | @ 15:00:00

## 2023-02-22 MED ADMIN — oxyCODONE (ROXICODONE) immediate release tablet 15 mg: 15 mg | ORAL | @ 17:00:00 | Stop: 2023-02-27

## 2023-02-22 MED ADMIN — buprenorphine-naloxone (SUBOXONE) 2-0.5 mg SL film 2 mg of buprenorphine: 1 | SUBLINGUAL | @ 17:00:00

## 2023-02-22 MED ADMIN — oxyCODONE (ROXICODONE) immediate release tablet 15 mg: 15 mg | ORAL | @ 05:00:00 | Stop: 2023-02-22

## 2023-02-22 MED ADMIN — oxyCODONE (ROXICODONE) immediate release tablet 15 mg: 15 mg | ORAL | @ 09:00:00 | Stop: 2023-02-22

## 2023-02-22 MED ADMIN — HYDROmorphone (PF) (DILAUDID) injection 1 mg: 1 mg | INTRAVENOUS | @ 03:00:00 | Stop: 2023-02-28

## 2023-02-22 NOTE — Unmapped (Signed)
 Nurse reviewed Lovenox teaching with patient. Patient stated that he feels comfortable administering injections in legs.  New pain medication regimen was reviewed with patient and was administered as prescribed.        Problem: Wound  Goal: Optimal Coping  Outcome: Progressing  Goal: Optimal Functional Ability  Outcome: Progressing  Goal: Absence of Infection Signs and Symptoms  Outcome: Progressing     Problem: Adult Inpatient Plan of Care  Goal: Absence of Hospital-Acquired Illness or Injury  Outcome: Progressing  Goal: Optimal Comfort and Wellbeing  Outcome: Progressing

## 2023-02-22 NOTE — Unmapped (Signed)
 Colorectal Surgery Progress Note    Today's Date: 02/22/2023  Admission Date: 02/14/2023  Length of Stay: Day 8, 8 Days Post-Op  Hospital Service: Henreitta Leber [facility])  Attending Physician: Sharyn Lull, MD     Assessment   Principal Problem:    Crohn's disease of colon with complication (CMS-HCC)  Active Problems:    Tobacco use disorder    Dakota Townsend is a 43 y.o. male with a medical history significant for Crohn's with several previous ileocolic resections. The patient is s/p laparoscopic partial colectomy with removal of terminal ileum with ileocolostomy on 2/6.    Subjective     Tolerating regular diet with 2 BMs. Did not walk yesterday. Tolerated weaning IV HM yesterday.     Plan   Neuro/Psych:  #Pain control  - SCH: PO tylenol 1000q6, gabapentin 300 TID  home buprenorphine, [id number]  - PRN: DC IV dilaudid 1/2q3 (breakthrough only),  Increase Oxy 15/20q6, valium 5q6  - dc Ketamine gtt (chronic pain managing) day 7/7    - Daily CMP needed    CV/Pulm:  - HDS, oxygenating well on RA    FEN/GI:  F: ML  E: Replete PRN  N: Regular diet + protein supplements   - Strict I/O's    Renal/GU:  - Voiding spontaneously, UOP adequate     Heme/ID:  - Afebrile, WBC WNL  - Hgb 8.5 today, continue to monitor     Prophylaxis: prophylactic Lovenox, SCDs, out of bed/ambulate TID, IS, PPI  - Patient/family learning Lovenox injections for home  - Plan to continue Lovenox for 28 days from surgery in setting of inflammatory bowel disease history and the increased VTE risk    Access: peripheral IV    Dispo: floor status  - Plan for discharge home tomorrow with [facility] stay Saturday evening    Emilio Aspen, MD  [facility] General Surgery, PGY5    Objective   Vital signs over the last 24 hours:  Temp:  [36.5 ??C (97.7 ??F)-36.8 ??C (98.2 ??F)] 36.5 ??C (97.7 ??F)  Heart Rate:  [78-89] 80  Resp:  [14-16] 16  BP: (99-127)/(56-71) 99/59  MAP (mmHg):  [70-85] 80  SpO2:  [95 %-100 %] 95 %    Wt Readings from Last 5 Encounters:   02/14/23 68.4 kg (150 lb 12.7 oz)   02/11/23 68.2 kg (150 lb 5.7 oz)   02/05/23 72.6 kg (160 lb)   01/29/23 72.6 kg (160 lb)   01/24/23 71.7 kg (158 lb)      Physical Exam:  Gen: alert, tired appearing, in no acute distress  CV: regular rate, normotensive  Pulm: breathing comfortably on room air  Abd: soft, nondistended, and diffusely tender to palpation. Surgical incisions approximated with Dermabond, clean and dry.  Ext: warm and well perfused bilaterally, no edema

## 2023-02-22 NOTE — Unmapped (Signed)
 Department of Anesthesiology  Pain Medicine Division    Chronic Pain Follow Up Inpatient Consult Note    Requesting Attending Physician:  Sharyn Lull, MD  Service Requesting Consult:  Henreitta Leber [facility])    The patient is a 43 y.o. male w/PMH Crohn's disease s/p previous ileocolic resections, pneumonia, admitted on 02/14/23, now POD#8 s/p laparoscopic partial colectomy with removal of terminal ileum and ileocolostomy on 02/14/23.     We were consulted for complex pain management.    Home Analgesic Medications:  Suboxone 8-2 mg SL BID  OTC APAP 1g PO q4h PRN    Interval Events: None    PRN Medication Requirements: IV HM x 7, PO oxycodone x 4    Vital Sign Trends: Vitally stable, afebrile.    Labs: No notable derangements , LFTs slightly elevated    -The chronic pain service is a consult service and does not place orders, just makes recommendations (except ketamine and lidocaine infusions)   -Please evaluate all patients on opioids for appropriateness of prescribing narcan at discharge.  The chronic pain service can assist with this.  Nasal narcan is covered by most insurances.  -Recommendations given apply to the current hospitalization and do not reflect long term recommendations.    -Continue APAP 1g PO q8h [facility]  -Continue Suboxone 2-0.5 mg SL TID  -Discontinue hydromorphone to 1 mg IV q3h PRN  -Continue oxycodone 15 mg po q6h SCH  -Increase PRN oxycodone to 15-20 mg PO q6h PRN     -Patient is on a low dose ketamine infusion for pain: This will discontinue today.               -Ketamine gtt #1 of 4 maximum over a 12 month period               -All ketamine orders will be managed by the [facility] Chronic Pain Service only-orders can only be placed by attendings and fellows              -Ketamine requires dedicated IV (PIV or central line lumen)              -ketamine infusion: 0.25 mg/kg/hr Actual body weight (BMI <40-Actual BW, BMI >40-IBW) will discontinue at 1313              -Patient location: Step down: dose can increase every 4 hours (can decrease anytime)              -Day 7(max-7 days). Started (date): 02/15/23              -Prior to starting: Recent EKG, CMP (creatinine, LFTs), urine pregnancy test (if applicable)              -Daily CMP (creatinine and LFTs daily) while running              -Please contact the Chronic Pain service with any questions or concerns about ketamine infusion.    Home MME: N/A - on Suboxone  Current MME: 420 > 315 today (+suboxone)      We will continue to follow.    Naloxone Rx at discharge?  Is patient on opioids? Yes.  1)Is dose >50MME?  Yes.  2) Is patient prescribed a benzodiazepine (w opioids)? No.  3)Hx of overdose?  No.  4) Hx of substance use disorder? Yes.  5) Opioids likely to last greater than a week after discharge? Yes.    If yes to 2 or more, prescribe naloxone at  discharge.  Nasal narcan for most insured (Nasal narcan 4mg /actuation, prescribe 1 kit, instructions at [email or web address]).  For uninsured, chronic pain can work to assist in finding an option.  OTC nasal narcan now available at most pharmacies for around $45.    Interim History  There were no Acute Events Overnight.  The patient is obtaining adequate pain relief on current medication regimen and feels that their pain is Somewhat controlled    Pt was sleepy during the visit. States his pain is still there. Understands that the IV pain meds are going to be discontinued today and the ketamine. Denies any questions. Spoke with psychology intern yesterday and his mental health provider (virtually).    Analgesia Evaluation:  Pain at minimum: 6/10  Pain at maximum: 8/10    Inpatient Medications  Current Facility-Administered Medications   Medication Dose Route Frequency Provider Last Rate Last Admin    acetaminophen (TYLENOL) tablet 1,000 mg  1,000 mg Oral Q6H SCH Emilio Aspen, MD   1,000 mg at 02/22/23 0650    albuterol (PROVENTIL HFA;VENTOLIN HFA) 90 mcg/actuation inhaler 2 puff  2 puff Inhalation Q4H PRN Emilio Aspen, MD        buprenorphine-naloxone (SUBOXONE) 2-0.5 mg SL film 2 mg of buprenorphine  1 Film Sublingual TID Dawson Bills, MD   2 mg of buprenorphine at 02/22/23 0559    calcium carbonate (TUMS) chewable tablet 400 mg elem calcium  400 mg elem calcium Oral TID PRN Rookard, Alaina A, PA   400 mg elem calcium at 02/15/23 1413    diazePAM (VALIUM) tablet 5 mg  5 mg Oral Q6H PRN Emilio Aspen, MD   5 mg at 02/22/23 0042    divalproex ER (DEPAKOTE ER) extended released 24 hr tablet 1,000 mg  1,000 mg Oral Nightly Lorre Nick, Edsel Petrin, MD/DMD   1,000 mg at 02/21/23 2020    enoxaparin (LOVENOX) syringe 40 mg  40 mg Subcutaneous Q24H SCH Emilio Aspen, MD   40 mg at 02/21/23 8119    gabapentin (NEURONTIN) capsule 300 mg  300 mg Oral TID Billey Chang, MD   300 mg at 02/21/23 2134    HYDROmorphone (PF) (DILAUDID) injection 1 mg  1 mg Intravenous Q3H PRN Zappas, Barbie Haggis, MD   1 mg at 02/22/23 0330    ketamine 500 mg in sodium chloride 0.9 % 250 mL (2 mg/mL)  0.25 mg/kg/hr Intravenous Continuous Karma Greaser, MD 8.55 mL/hr at 02/22/23 0741 0.25 mg/kg/hr at 02/22/23 0741    lactated Ringers infusion  10 mL/hr Intravenous Continuous Emilio Aspen, MD   Stopped at 02/22/23 0741    lamoTRIgine (LaMICtal) tablet 200 mg  200 mg Oral Daily Emilio Aspen, MD   200 mg at 02/21/23 0816    nicotine (NICODERM CQ) 21 mg/24 hr patch 1 patch  1 patch Transdermal Daily Rookard, Alaina A, PA   1 patch at 02/21/23 1478    nicotine polacrilex (NICORETTE) gum 4 mg  4 mg Buccal Q1H PRN Nicholaus Corolla I, MD        nicotine polacrilex (NICORETTE) lozenge 4 mg  4 mg Buccal Q1H PRN Nicholaus Corolla I, MD        ondansetron (ZOFRAN-ODT) disintegrating tablet 4 mg  4 mg Oral Q6H PRN Emilio Aspen, MD        Or    ondansetron [facility]) injection 4 mg  4 mg Intravenous Q6H PRN Emilio Aspen, MD   4 mg at  02/14/23 2259    oxyCODONE (ROXICODONE) immediate release tablet 10 mg  10 mg Oral Q6H PRN Nicholaus Corolla I, MD        Or    oxyCODONE (ROXICODONE) immediate release tablet 15 mg  15 mg Oral Q6H PRN Nicholaus Corolla I, MD   15 mg at 02/22/23 0406    oxyCODONE (ROXICODONE) immediate release tablet 15 mg  15 mg Oral Q6H Nicholaus Corolla I, MD   15 mg at 02/22/23 0650    sodium chloride (NS) 0.9 % infusion  10 mL/hr Intravenous Continuous Cloyde Reams D, DO 10 mL/hr at 02/15/23 1112 10 mL/hr at 02/15/23 1112     Objective:     Vital Signs    Temp:  [36.5 ??C (97.7 ??F)-36.8 ??C (98.2 ??F)] 36.5 ??C (97.7 ??F)  Heart Rate:  [78-89] 80  Resp:  [14-16] 16  BP: (100-127)/(56-71) 125/64  MAP (mmHg):  [70-85] 80  SpO2:  [97 %-100 %] 100 %    Physical Exam  GENERAL:  Well developed, well-nourished male and is in mild distress.   HEAD/NECK:    Reveals normocephalic/atraumatic.   CARDIOVASCULAR:   Warm, well perfused  LUNGS:   Normal work of breathing, no supplemental 02  EXTREMITIES:  Warm, no clubbing, cyanosis, or edema was noted.  NEUROLOGIC:    The patient was alert and oriented times four with normal language, attention, cognition and memory. MAEW. Cranial nerve exam was grossly normal.   MUSCULOSKELETAL:    No deformities.  SKIN:  No obvious rashes lesions or erythema  PSY:  Appropriate affect and mood.    Test Results    Lab Results   Component Value Date    CREATININE 0.99 02/22/2023     Lab Results   Component Value Date    ALKPHOS 161 (H) 02/22/2023    BILITOT <0.2 (L) 02/22/2023    BILIDIR <0.10 02/22/2023    PROT 6.0 02/22/2023    ALBUMIN 3.1 (L) 02/22/2023    ALT 59 (H) 02/22/2023    AST 44 (H) 02/22/2023     Problem List    Principal Problem:    Crohn's disease of colon with complication (CMS-HCC)  Active Problems:    Tobacco use disorder    Stark Klein, MD  Addiction Medicine Fellow

## 2023-02-23 MED ORDER — ACETAMINOPHEN 500 MG TABLET
ORAL_TABLET | Freq: Four times a day (QID) | ORAL | 0 refills | 4.00 days | Status: CP
Start: 2023-02-23 — End: ?

## 2023-02-23 MED ORDER — NICOTINE (POLACRILEX) 4 MG GUM
BUCCAL | 0 refills | 5.00 days | Status: CP | PRN
Start: 2023-02-23 — End: 2023-03-25
  Filled 2023-02-23: qty 110, 5d supply, fill #0

## 2023-02-23 MED ORDER — GABAPENTIN 300 MG CAPSULE
ORAL_CAPSULE | Freq: Three times a day (TID) | ORAL | 0 refills | 14.00 days | Status: CP
Start: 2023-02-23 — End: 2023-03-09
  Filled 2023-02-23: qty 42, 14d supply, fill #0

## 2023-02-23 MED ORDER — NALOXONE 4 MG/ACTUATION NASAL SPRAY
0 refills | 0.00 days | Status: CP
Start: 2023-02-23 — End: ?
  Filled 2023-02-23: qty 2, 1d supply, fill #0

## 2023-02-23 MED ORDER — DIAZEPAM 5 MG TABLET
ORAL_TABLET | Freq: Four times a day (QID) | ORAL | 0 refills | 3.00 days | Status: CP | PRN
Start: 2023-02-23 — End: 2023-02-26
  Filled 2023-02-23: qty 12, 3d supply, fill #0

## 2023-02-23 MED ORDER — NICOTINE 21 MG/24 HR DAILY TRANSDERMAL PATCH
MEDICATED_PATCH | TRANSDERMAL | 0 refills | 28.00 days | Status: CP
Start: 2023-02-23 — End: ?
  Filled 2023-02-23: qty 28, 28d supply, fill #0

## 2023-02-23 MED ORDER — NICOTINE (POLACRILEX) 4 MG BUCCAL LOZENGE
BUCCAL | 0 refills | 3.00 days | Status: CP | PRN
Start: 2023-02-23 — End: 2023-03-25
  Filled 2023-02-23: qty 72, 3d supply, fill #0

## 2023-02-23 MED ORDER — ONDANSETRON 4 MG DISINTEGRATING TABLET
ORAL_TABLET | Freq: Three times a day (TID) | 5 refills | 4.00 days | Status: CP | PRN
Start: 2023-02-23 — End: ?

## 2023-02-23 MED ORDER — OXYCODONE 15 MG TABLET
ORAL_TABLET | ORAL | 0 refills | 3.00 days | Status: CP | PRN
Start: 2023-02-23 — End: 2023-02-26
  Filled 2023-02-23: qty 18, 3d supply, fill #0

## 2023-02-23 MED ADMIN — nicotine (NICODERM CQ) 21 mg/24 hr patch 1 patch: 1 | TRANSDERMAL | @ 13:00:00 | Stop: 2023-02-23

## 2023-02-23 MED ADMIN — oxyCODONE (ROXICODONE) immediate release tablet 15 mg: 15 mg | ORAL | @ 11:00:00 | Stop: 2023-02-23

## 2023-02-23 MED ADMIN — acetaminophen (TYLENOL) tablet 1,000 mg: 1000 mg | ORAL | @ 05:00:00 | Stop: 2023-02-23

## 2023-02-23 MED ADMIN — buprenorphine-naloxone (SUBOXONE) 2-0.5 mg SL film 2 mg of buprenorphine: 1 | SUBLINGUAL | @ 17:00:00 | Stop: 2023-02-23

## 2023-02-23 MED ADMIN — acetaminophen (TYLENOL) tablet 1,000 mg: 1000 mg | ORAL | @ 17:00:00 | Stop: 2023-02-23

## 2023-02-23 MED ADMIN — lamoTRIgine (LaMICtal) tablet 200 mg: 200 mg | ORAL | @ 13:00:00 | Stop: 2023-02-23

## 2023-02-23 MED ADMIN — buprenorphine-naloxone (SUBOXONE) 2-0.5 mg SL film 2 mg of buprenorphine: 1 | SUBLINGUAL | @ 11:00:00 | Stop: 2023-02-23

## 2023-02-23 MED ADMIN — oxyCODONE (ROXICODONE) immediate release tablet 15 mg: 15 mg | ORAL | @ 17:00:00 | Stop: 2023-02-23

## 2023-02-23 MED ADMIN — oxyCODONE (ROXICODONE) immediate release tablet 20 mg: 20 mg | ORAL | @ 09:00:00 | Stop: 2023-02-23

## 2023-02-23 MED ADMIN — diazePAM (VALIUM) tablet 5 mg: 5 mg | ORAL | @ 16:00:00 | Stop: 2023-02-23

## 2023-02-23 MED ADMIN — oxyCODONE (ROXICODONE) immediate release tablet 20 mg: 20 mg | ORAL | @ 15:00:00 | Stop: 2023-02-23

## 2023-02-23 MED ADMIN — diazePAM (VALIUM) tablet 5 mg: 5 mg | ORAL | @ 10:00:00 | Stop: 2023-02-23

## 2023-02-23 MED ADMIN — oxyCODONE (ROXICODONE) immediate release tablet 20 mg: 20 mg | ORAL | @ 03:00:00 | Stop: 2023-02-27

## 2023-02-23 MED ADMIN — diazePAM (VALIUM) tablet 5 mg: 5 mg | ORAL | @ 04:00:00

## 2023-02-23 MED ADMIN — oxyCODONE (ROXICODONE) immediate release tablet 15 mg: 15 mg | ORAL | @ 05:00:00 | Stop: 2023-02-23

## 2023-02-23 MED ADMIN — gabapentin (NEURONTIN) capsule 300 mg: 300 mg | ORAL | @ 03:00:00

## 2023-02-23 MED ADMIN — enoxaparin (LOVENOX) syringe 40 mg: 40 mg | SUBCUTANEOUS | @ 13:00:00 | Stop: 2023-02-23

## 2023-02-23 MED ADMIN — acetaminophen (TYLENOL) tablet 1,000 mg: 1000 mg | ORAL | @ 11:00:00 | Stop: 2023-02-23

## 2023-02-23 MED ADMIN — gabapentin (NEURONTIN) capsule 300 mg: 300 mg | ORAL | @ 13:00:00 | Stop: 2023-02-23

## 2023-02-23 MED ADMIN — divalproex ER (DEPAKOTE ER) extended released 24 hr tablet 1,000 mg: 1000 mg | ORAL | @ 03:00:00

## 2023-02-23 NOTE — Unmapped (Signed)
 Department of Anesthesiology  Pain Medicine Division    Chronic Pain Follow Up Inpatient Consult Note    Requesting Attending Physician:  Sharyn Lull, MD  Service Requesting Consult:  Henreitta Leber [facility])    The patient is a 43 y.o. male w/PMH Crohn's disease s/p previous ileocolic resections, pneumonia, admitted on 02/14/23, now POD#9 s/p laparoscopic partial colectomy with removal of terminal ileum and ileocolostomy on 02/14/23. We were consulted for complex pain management.     We spoke at length with Dakota Townsend about his discharge plan with regard to opioid prescribing. He has had great success transitioning off of IV pain medication, tolerating large decreases in MMEs administered daily. Because this has gone well, we recommend a gradual taper of oral opioids as below. Unfortunately there is risk associated with discharging him on a relatively high dose of oral oxycodone in conjunction with a benzodiazepine, and we worry that he is at risk of complication. We counseled him extensively about this and ensured that he knew not to take his oxycodone and diazepam simultaneously. We also confirmed that he had a prescription for Narcan and that he and his mother both knew the indications and method for administration. We discussed this with the primary team, who are also aware and separately spent time conversing with Dakota Townsend and his mother about the same.    -The chronic pain service is a consult service and does not place orders, just makes recommendations (except ketamine and lidocaine infusions)   -Please evaluate all patients on opioids for appropriateness of prescribing narcan at discharge.  The chronic pain service can assist with this.  Nasal narcan is covered by most insurances.  -Recommendations given apply to the current hospitalization and do not reflect long term recommendations.    While inpatient:  - Continue APAP 1g PO q8h [facility]  - Continue Suboxone 2-0.5 mg SL TID  - Continue gabapentin 300 mg TID  - Continue oxycodone 15 mg po q6h SCH  - Continue oxycodone to 15-20 mg PO q6h PRN  - Lamictal, Valium per primary team    Upon discharge, recommend:  - starting with PO oxycodone 15 mg q4h. Tomorrow (2/16) patient can take 15 mg q6h; the day afterwards (2/17), 15 mg q8h, continuing to reduce intake by one dose daily until fully weaned.  - we anticipate that the patient may adjust this regimen and his suboxone dose when meeting with his outpatient provider on Monday  - please discharge with intranasal naloxone PRN    Home MME: N/A - on Suboxone  Current MME: 315 > 200 + suboxone (2/14 0700 - 2/15 0700)    We will continue to follow.    Naloxone Rx at discharge?  Is patient on opioids? Yes.  1)Is dose >50MME?  Yes.  2) Is patient prescribed a benzodiazepine (w opioids)? No.  3)Hx of overdose?  No.  4) Hx of substance use disorder? Yes.  5) Opioids likely to last greater than a week after discharge? Yes.    If yes to 2 or more, prescribe naloxone at discharge.  Nasal narcan for most insured (Nasal narcan 4mg /actuation, prescribe 1 kit, instructions at [email or web address]).  For uninsured, chronic pain can work to assist in finding an option.  OTC nasal narcan now available at most pharmacies for around $45.    Interim History  There were no Acute Events Overnight.  The patient is obtaining adequate pain relief on current medication regimen and feels that their pain is Somewhat controlled  Our conversation with Dakota Townsend today revolved around discharge planning. The surgical team is able to prescribe three days of opioids to take home. He is very anxious about being in pain after discharge and informed us in great detail about his success with scheduling pain medication rigorously, in addition to sharing his distress about reported variability in time between doses experienced during this hospitalization. He also asked whether he could be sent home with a higher dose in case he needs it.    We empathized with him and expressed our support, but expressed our worries about the safety of sending him (or anyone) home with such high doses of a dangerous medication. This risk is compounded by the team's plan to discharge him on Valium, which augments the risk of opioid-associated morbidity from respiratory depression. Dakota Townsend was understanding and shared with [place] his hope to be off of oxycodone as soon as possible with a return to suboxone monotherapy. Fortunately, he has a meeting with his probation-mandated addiction medicine/mental health provider, who prescribes his suboxone, on Monday.     PRN use entailed:  - 5 x 15mg  oxy q6h  - 4 x 5mg  PO Valium q6h    Analgesia Evaluation:  Pain at minimum: 7/10  Pain at maximum: 9/10    Inpatient Medications  Current Facility-Administered Medications   Medication Dose Route Frequency Provider Last Rate Last Admin    acetaminophen (TYLENOL) tablet 1,000 mg  1,000 mg Oral Q6H SCH Emilio Aspen, MD   1,000 mg at 02/23/23 0604    albuterol (PROVENTIL HFA;VENTOLIN HFA) 90 mcg/actuation inhaler 2 puff  2 puff Inhalation Q4H PRN Emilio Aspen, MD        buprenorphine-naloxone (SUBOXONE) 2-0.5 mg SL film 2 mg of buprenorphine  1 Film Sublingual TID Dawson Bills, MD   2 mg of buprenorphine at 02/23/23 0604    calcium carbonate (TUMS) chewable tablet 400 mg elem calcium  400 mg elem calcium Oral TID PRN Rookard, Alaina A, PA   400 mg elem calcium at 02/15/23 1413    diazePAM (VALIUM) tablet 5 mg  5 mg Oral Q6H PRN Rookard, Alaina A, PA   5 mg at 02/23/23 0459    divalproex ER (DEPAKOTE ER) extended released 24 hr tablet 1,000 mg  1,000 mg Oral Nightly Lorre Nick, Edsel Petrin, MD/DMD   1,000 mg at 02/22/23 2158    enoxaparin (LOVENOX) syringe 40 mg  40 mg Subcutaneous Q24H SCH Emilio Aspen, MD   40 mg at 02/23/23 4259    gabapentin (NEURONTIN) capsule 300 mg  300 mg Oral TID Billey Chang, MD   300 mg at 02/23/23 5638    lamoTRIgine (LaMICtal) tablet 200 mg  200 mg Oral Daily Emilio Aspen, MD   200 mg at 02/23/23 7564    nicotine (NICODERM CQ) 21 mg/24 hr patch 1 patch  1 patch Transdermal Daily Rookard, Alaina A, PA   1 patch at 02/23/23 0826    nicotine polacrilex (NICORETTE) gum 4 mg  4 mg Buccal Q1H PRN Nicholaus Corolla I, MD        nicotine polacrilex (NICORETTE) lozenge 4 mg  4 mg Buccal Q1H PRN Nicholaus Corolla I, MD        ondansetron (ZOFRAN-ODT) disintegrating tablet 4 mg  4 mg Oral Q6H PRN Emilio Aspen, MD        Or    ondansetron [facility]) injection 4 mg  4 mg Intravenous Q6H PRN Emilio Aspen,  MD   4 mg at 02/14/23 2259    oxyCODONE (ROXICODONE) immediate release tablet 15 mg  15 mg Oral Q6H PRN Rookard, Alaina A, PA   15 mg at 02/22/23 1024    Or    oxyCODONE (ROXICODONE) immediate release tablet 20 mg  20 mg Oral Q6H PRN Rookard, Alaina A, PA   20 mg at 02/23/23 0359    oxyCODONE (ROXICODONE) immediate release tablet 15 mg  15 mg Oral Q6H Rookard, Alaina A, PA   15 mg at 02/23/23 0604     Objective:     Vital Signs    Temp:  [36.5 ??C (97.7 ??F)] 36.5 ??C (97.7 ??F)  Heart Rate:  [73-98] 98  Resp:  [16-17] 16  BP: (96-111)/(59-74) 104/69  MAP (mmHg):  [75-83] 81  SpO2:  [95 %-99 %] 97 %    Physical Exam  GENERAL:  Thin male curled on side in hospital bed in mild distress.   HEAD/NECK:    Reveals normocephalic/atraumatic.   CARDIOVASCULAR:   Warm, well perfused  LUNGS:   Normal work of breathing, no supplemental 02  EXTREMITIES:  Warm, no clubbing, cyanosis, or edema was noted.  NEUROLOGIC:    The patient was alert and oriented times four with normal language, attention, cognition and memory. MAEW. Cranial nerve exam was grossly normal.   MUSCULOSKELETAL:    No deformities.  SKIN:  No obvious rashes lesions or erythema  PSY:  Anxious affect, mood congruent.    Test Results    Lab Results   Component Value Date    CREATININE 0.99 02/22/2023     Lab Results   Component Value Date    ALKPHOS 161 (H) 02/22/2023    BILITOT <0.2 (L) 02/22/2023    BILIDIR <0.10 02/22/2023    PROT 6.0 02/22/2023    ALBUMIN 3.1 (L) 02/22/2023    ALT 59 (H) 02/22/2023    AST 44 (H) 02/22/2023     Problem List    Principal Problem:    Crohn's disease of colon with complication (CMS-HCC)  Active Problems:    Tobacco use disorder

## 2023-02-23 NOTE — Unmapped (Signed)
 No acute events. VS Afebrile. Pain managed with PRN med and scheduled oxy. Voiding. Ambulating halls. Endorses watery BM.  AAOX4. Free from falls or injury.  Problem: Wound  Goal: Optimal Coping  Outcome: Progressing  Goal: Optimal Functional Ability  Outcome: Progressing  Intervention: Optimize Functional Ability  Recent Flowsheet Documentation  Taken 02/23/2023 0200 by Jackolyn Confer, RN  Activity Management: ambulated outside room  Goal: Absence of Infection Signs and Symptoms  Outcome: Progressing  Goal: Improved Oral Intake  Outcome: Progressing  Goal: Optimal Pain Control and Function  Outcome: Progressing  Goal: Skin Health and Integrity  Outcome: Progressing  Intervention: Optimize Skin Protection  Recent Flowsheet Documentation  Taken 02/23/2023 0200 by Jackolyn Confer, RN  Activity Management: ambulated outside room  Goal: Optimal Wound Healing  Outcome: Progressing     Problem: Adult Inpatient Plan of Care  Goal: Plan of Care Review  Outcome: Progressing  Goal: Patient-Specific Goal (Individualized)  Outcome: Progressing  Goal: Absence of Hospital-Acquired Illness or Injury  Outcome: Progressing  Intervention: Identify and Manage Fall Risk  Recent Flowsheet Documentation  Taken 02/22/2023 2030 by Jackolyn Confer, RN  Safety Interventions:   lighting adjusted for tasks/safety   low bed  Intervention: Prevent Skin Injury  Recent Flowsheet Documentation  Taken 02/22/2023 2030 by Jackolyn Confer, RN  Positioning for Skin: Supine/Back  Goal: Optimal Comfort and Wellbeing  Outcome: Progressing  Goal: Readiness for Transition of Care  Outcome: Progressing  Goal: Rounds/Family Conference  Outcome: Progressing     Problem: Pain Acute  Goal: Optimal Pain Control and Function  Outcome: Progressing     Problem: Fall Injury Risk  Goal: Absence of Fall and Fall-Related Injury  Outcome: Progressing  Intervention: Promote Injury-Free Environment  Recent Flowsheet Documentation  Taken 02/22/2023 2030 by Jackolyn Confer, RN  Safety Interventions:   lighting adjusted for tasks/safety   low bed     Problem: Malnutrition  Goal: Improved Nutritional Intake  Outcome: Progressing

## 2023-02-23 NOTE — Unmapped (Signed)
 [facility] Lower Gastrointestinal Surgery  Discharge Summary       Patient Information: Cylas Falzone  [birth year]  [id number]   Code Status: Full Code   PCP:  Bonnetta Barry, [facility]   Primary Surgeon:  Primary: Sharyn Lull, MD  Resident - Assisting: Emilio Aspen, MD       Admission Date: 02/14/2023    Discharge Date: 02/23/2023    Discharge Attending: Sharyn Lull, MD   Discharge Service:  Henreitta Leber [facility])   Discharge To:  Home      Primary Discharge Diagnosis  Principal Problem:    Crohn's disease of colon with complication (CMS-HCC)  Active Problems:    Tobacco use disorder    Hospital Course  Maui Nochum Fenter is a 43 y.o. male with a medical history significant for Crohn's disease who underwent a laparoscopic enterolysis x 120 minutes, laparoscopic redo-ileocolic resection on 02/14/2023. Intraoperatively, there was severe stricturing at the previous ileocolic anastomosis with evidence of Crohn's disease for ~30 cm of small bowel proximal to the anastomosis with deep ulcerations. He tolerated the procedure well, was extubated in the OR, and was transferred to the recovery room in stable condition, where he received routine postoperative care before being transferred to the floor.    The Acute/Chronic Pain Service was engaged early for assistance with pain management, given his Suboxone use. He was started on a ketamine infusion, along with Dilaudid PCA and breakthrough IV and oral opioids. Suboxone was also continued. Recommendations regarding pain medication were greatly appreciated and adjusted per the Pain Service's recommendations. The patient was able to void spontaneously and adequately after his urinary catheter was removed. His diet was slowly advanced and at the time of discharge, he was tolerating a regular diet. Pain medications were slowly transitioned to oral with the assistance of the Pain Service. Mr. Delduca will discharge with 3 days worth of oxycodone 15 mg Q4H PRN and Valium 5mg  PO PRN Q6H and has an appointment scheduled with his home psychiatrist who prescribes Suboxone on 02/25/23. Patient was prescribed Narcan at discharge. The patient will be discharged on prophylactic Lovenox due to having inflammatory bowel disease and the associated higher risk of VTE. The patient was taught how to administer the injections by nursing.     On day of discharge, the patient was seen and examined by the Colorectal Surgery team. The discharge plan and instructions were reviewed and all questions were answered. The patient is being discharged on 02/23/23 with appropriate outpatient clinic follow up scheduled.      Day of Discharge Note  Assessment and Discharge Day Services:  The patient was seen and examined by the [facility]) team on the day of discharge. Vital signs and laboratory values were assessed. Surgical wounds were inspected. The discharge plan and instructions were reviewed and all questions were answered. Pain plan was discussed with both patient and the Chronic pain team.     Subjective:  No acute events overnight. The patient reports adequate pain control and denies fever or chills. The patient continues to pass bowel movements and is tolerating a regular diet without nausea or abdominal pain.    Objective:   BP 104/69  - Pulse 98  - Temp 36.5 ??C (97.7 ??F) (Oral)  - Resp 16  - Ht 180.3 cm (5' 10.98)  - Wt 68.4 kg (150 lb 12.7 oz)  - SpO2 97%  - BMI 21.04 kg/m??   Body mass index is  21.04 kg/m??.    General Appearance: alert, awake, well appearing, in no acute distress.  Abdomen: soft, nondistended, and nontender to palpation. Surgical incisions approximated with Dermabond, clean and dry.    Condition at Discharge: Good. The patient is back to their functional baseline and is self-managing all activites of daily living.    Severe Protein-Calorie Malnutrition in the context of chronic illness (02/15/23 1216)  Energy Intake: < or equal to 75% of estimated energy requirement for > or equal to 1 month  Interpretation of Wt. Loss: > 7.5% x 3 month  Fat Loss: Severe  Muscle Loss: Severe  Malnutrition Score: 4    Discharge Medications     Your Medication List        STOP taking these medications      ALPRAZolam 0.5 MG 24 hr tablet  Commonly known as: XANAX XR     loperamide 2 mg capsule  Commonly known as: IMODIUM     SKYRIZI 360 mg/2.4 mL (150 mg/mL) Injt  Generic drug: risankizumab-rzaa            START taking these medications      diazePAM 5 MG tablet  Commonly known as: VALIUM  Take 1 tablet (5 mg total) by mouth every six (6) hours as needed for up to 3 days.     enoxaparin 40 mg/0.4 mL Syrg  Commonly known as: LOVENOX  Inject 0.4 mL (40 mg total) under the skin daily for 28 days.  Start taking on: February 24, 2023     gabapentin 300 MG capsule  Commonly known as: NEURONTIN  Take 1 capsule (300 mg total) by mouth Three (3) times a day for 14 days.     naloxone 3 mg/actuation nasal spray  Commonly known as: NARCAN/RIVIVE  One spray in either nostril once for known/suspected opioid overdose. May repeat every 2-3 minutes in alternating nostril til EMS arrives     nicotine polacrilex 4 MG gum  Commonly known as: NICORETTE  Apply 1 each (4 mg total) to cheek every hour as needed for smoking cessation.     nicotine polacrilex 4 MG lozenge  Commonly known as: NICORETTE  Apply 1 lozenge (4 mg total) to cheek every hour as needed for smoking cessation.     oxyCODONE 15 MG immediate release tablet  Commonly known as: ROXICODONE  Take 1 tablet (15 mg total) by mouth every four (4) hours as needed for pain for up to 3 days.            CHANGE how you take these medications      acetaminophen 500 MG tablet  Commonly known as: TYLENOL  Take 2 tablets (1,000 mg total) by mouth every six (6) hours.  What changed:   when to take this  reasons to take this     buprenorphine-naloxone 8-2 mg sublingual film  Commonly known as: SUBOXONE  Place 1 Film (8 mg of buprenorphine total) under the tongue Three (3) times a day for 3 days.  What changed: See the new instructions.            CONTINUE taking these medications      divalproex ER 500 MG extended released 24 hr tablet  Commonly known as: DEPAKOTE ER  TAKE 2 TABLETS BY MOUTH WITH SUPPER DAILY     empty container Misc  Use as directed     lamoTRIgine 200 MG tablet  Commonly known as: LaMICtal  Take 1 tablet (200 mg total) by mouth.  nicotine 21 mg/24 hr patch  Commonly known as: NICODERM CQ  Place 1 patch on the skin daily.     ondansetron 4 MG disintegrating tablet  Commonly known as: ZOFRAN-ODT  Dissolve 1 tablet (4 mg total) in the mouth every eight (8) hours as needed for nausea.            Discharge Instructions  What to expect after discharge from Colorectal Surgery    You had the following surgery: Laparoscopic enterolysis x 120 minutes, laparoscopic redo-ileocolic resection on 02/14/2023     Your attending surgeon is: Dr. Milbert Coulter    To contact the [facility] Multidisciplinary Surgery Clinic (GI Surgery), call: [phone]    To contact the GI Surgery Nurse Coordinator, call: Everlene Balls at [phone]      What???s next? Your recovery continues at home  Recovery does not end once you leave the hospital. At your next place of recovery, you will continue to make progress - your appetite and energy level will slowly improve. You may have an occasional ???bad day??? where you don???t feel as good as you did the day before, but then have a few ???good days??? to follow. Overall, you should be improving. If you think you are having too many bad days, something may be wrong and we want to hear from you.    Activity and Diet  You should continue walking, climbing stairs, and you may ride in a car.  You should avoid strenuous activity, heavy exercise, or heavy lifting (more than 10-15 pounds) for 2 weeks after surgery.  You can shower after discharge, but do not submerge surgical incisions or wounds in any water for 2 weeks.  We will discuss when you can return to full activities, without restrictions, at your follow up visit.  You should not drive if you are taking prescription pain medications (if you were driving prior to coming to the hospital). We will discuss when you can drive again at your follow up visit.  You can continue to consume a regular diet after discharge, avoiding fresh fruits and vegetables, until you are seen at your follow up appointment.    Surgical Wound Care  Wounds closed with skin glue:   Use regular soap and water.   Avoid rubbing the incisions and pulling the glue.   Pat dry when you are finished.   The glue will start to peel and fall off when the wound is healed.    Bowel Movements  Your bowel function may be unpredictable at first, but will become more predictable with time.    Constipation: You should try to have a bowel movement at least every other day. If two days pass without one, take an ounce of milk of magnesia (available over-the-counter). If there is no result, repeat this dose in six hours. If there is no result within 24 hours of repeating the dose, call your Nurse Coordinator.  Diarrhea: Diarrhea can also be a concern. If you were having loose bowel movements in the hospital and now you are having them more frequently, and you are not taking stool softeners or laxatives, call your Nurse Coordinator. If you are having watery bowel movements and you are taking stool softeners or laxatives, you should stop taking these medications. If your symptoms don???t improve within 24 hours, call your Nurse Coordinator.    Lovenox Injections After Surgery  You are being discharged home on Lovenox injections (generic name is enoxaparin). You should take this as directed to help prevent blood  clots after your surgery.  Below are step-by-step instructions for administering the injection:   Gather your prefilled syringe and an alcohol wipe or cotton ball dipped in alcohol. Wash and dry your hands.  Sit or lie in a position that lets you see your belly.  Choose a site on the right or left side of your belly, at least 2 inches from your belly button.  Clean the injection site with the alcohol wipe or cotton ball. Let it dry.  Remove the cap from the needle.  Hold the syringe like a pencil in one hand, keeping your fingers off the plunger. You may see an air bubble. It's okay. Unless your doctor tells you to, you don't have to remove the bubble.  With your other hand, slightly pinch a fold of skin at the injection site between your fingers and thumb.  Hold the syringe at a 90-degree angle to your skin so the needle is pointing straight at the injection site.  Quickly push the needle all the way into the pinched-up fold of skin. Then push the plunger all the way in, so that the medicine empties out of the syringe. As you're giving the shot, keep holding the fold of skin so that you don't inject the medicine into muscle.  Pull the needle straight out and let go of the skin.  Point the needle away from you. Follow the manufacturer instructions for safely disposing of the needle and syringe. Don't use the same needle more than one time. Throw away the needle and the syringe in a safe place, such as a special container for needles.  If you bleed a little, apply pressure over the shot area with your finger, a cotton ball, or a piece of gauze. To help avoid bruising, do not rub the area.  Slightly change the spot where you give the shot each time you do it.    Medications  This After Visit Summary contains a list of all medications that you may have started, need to stop, or will need to change when you get home. Please call your Nurse Coordinator if you have any questions about your medication list.   You will still have some pain and discomfort when you are discharged, this is normal and expected. As time passes and you become more active, this will slowly improve.   If you are still having pain at the time of discharge, you can continue to take Tylenol (available over-the-counter) every 4-6 hours following the instructions on the bottle. This will help to reduce the amount of prescription medication you will need to control your pain during recovery. If you have been told that you should not take Tylenol by any of your doctors, please call our office to discuss this medication before you take it.  At discharge, you will likely receive a prescription for pain medication that is expected to last you for at least a few days. You should start to decrease how often you are taking this medication over the first few days at home. If you think you will run out of pain medications before your follow-up appointment and believe you need more, please call to discuss with your Nurse Coordinator.    Reasons To Call After Discharge  You have a fever, with a temperature more than 101.5 F.  You have new nausea and/or vomiting that you did not have in the hospital.  You have worsening nausea and/or vomiting than what you were experiencing in the hospital.  You  are not eating or drinking because of having nausea and/or vomiting.  Your pain is not improving and is getting worse.  You feel dehydrated. Signs of dehydration include feeling light-headed, dizzy, or weak; your urine is very dark and you are only voiding in small volumes; your mouth/tongue feel dry despite drinking water; you are experiencing a racing heart rate and/or low blood pressure.  If you have surgical incisions that are turning red, have opened up or separated, have new drainage, or are excessively bleeding.  You have any new or lingering concerns and you are not sure if you should be worried about them.    Who To Contact After Discharge  For all questions/concerns during business hours (Monday - Friday between 8:00 AM - 4:30 PM), call your Nurse Coordinator.   If you are unable to reach your Nurse Coordinator directly during business hours, you may call the [facility] Surgery Clinic (GI Surgery) at [phone].  For urgent concerns after business hours, call the [facility] Operator at [phone] and ask for the Surgical Resident On Call.  You will be directed to a surgery resident who is likely not familiar with your specific care, but can help you deal with issues that cannot wait until regular business hours. Please be aware that the person is responding to many in-hospital emergencies and patient issues, and may not answer your phone call immediately.    Follow Up Appointment  You will receive a follow up clinic appointment to see your surgeon, usually within 2-3 weeks of hospital discharge. The purpose of this visit is to make sure you are healing and recovering.   If you do not have an appointment day and time listed on your discharge paperwork, you should call your Nurse Coordinator or the [facility] Multidisciplinary Surgery Clinic - GI Surgery within 3 days to schedule it.  Please make an appointment to see your primary care provider and pain management provider within 4-10 days after discharge to discuss your recent surgery.  It is important for you to follow up with your primary care provider for continued care. Please bring all discharge papers and information that you received from [facility] to that visit.                          [place] [facility]   Resources for Becoming and Staying Tobacco Free      While you were in the hospital, you had a phone conversation with a tobacco treatment specialist. Additional information and resources are included here to support you in your journey to becoming and staying tobacco and nicotine free.      Physical Improvements Following Smoking Cessation   SHORT-TERM   20 Minutes after a smoker quits:   Blood pressure drops to a level close to that before the last cigarette.   Temperature of hands and feet increases to normal.   8 hours after a smoker quits:   Carbon monoxide level in the blood drops to normal.   24 hours after a smoker quits:    Chance of heart attack decreases.    9 months or less after a smoker quits:   Circulation improves.   Lung function increases by up to 30%.   Coughing, sinus congestion, fatigue, and shortness of breath decrease.   Cilia regain normal function in the lungs, increasing the ability to handle mucus, clean the lungs, and reduce infection.   LONG-TERM   1 year after a  smoker quits:   Excess risk of coronary heart disease is half that of a smoker.   5 years after a smoker quits:   Stroke risk is reduced to that of a non-smoker.   10 years after a smoker quits:   Lung cancer death rate is about half that of a continuing smoker   Risk of cancer of the mouth, throat, esophagus, bladder, kidneys, and pancreas decreases.   15 years after a smoker quits:   Risk of coronary heart disease is that of a non-smoker.           Physical Improvements Following Smokeless Tobacco Cessation   In addition to the above improvements associated with quitting smoking, the following improvements are specifically associated with quitting smokeless tobacco such as dip, snuff, and chew.   24 hours after quitting smokeless tobacco:    Breath begins to smell better.   Sense of taste and smell begin to return.   2 to 6 weeks after quitting smokeless tobacco:    Appearance of oral tissue returns to normal.   3 months after quitting smokeless tobacco:    Melanosis of the gums begins to improve.   5 years after quitting smokeless tobacco:    Risk of developing oral cancer is reduced by 50%.         Vaping: Common Myths & Facts   Myth # 1: Vaping is better for your health than smoking cigarettes.   FACT: Vaping exposes the lungs to a variety of chemicals, including those added to e-liquids, that become toxic during the heating/vaporizing process. Nicotine replacement therapy, like nicotine patches, have been proven to be the safest and most effective alternative for nicotine delivery.      Myth # 2: Vaping is an effective way to quit smoking.   FACT: The amount of nicotine in vapes is not regulated and can contribute to increased cravings to smoke.  Nicotine replacement therapy in combination with smoking cessation counseling is the most effective approach to becoming tobacco and nicotine free.      Myth # 3: Vaping will save me money.   FACT: Vapes and vape accessories come at a wide range of costs and need to be replaced frequently.  In the end, switching to disposable or refillable vapes does not cost less than other tobacco products.  There are several ways to access affordable and/or free nicotine placement therapy products including through prescription, pharmacy assistance plans, and [facility].      Resources   [facility] offers comprehensive in-person and telehealth tobacco treatment counseling services. Services include medication management, supportive counseling, and additional referral resources. Visit [place] at the [facility] building [phone] [place] [address]). Open to everyone, and parking is free. Call [phone] to schedule an appointment or [phone] for general information. Services are billable by insurance.                        Call 1-800-QUIT-NOW (6102349920), Text READY to 200-400, or go to www.[facility]uitline[facility].com      All [place] residents are eligible for evidence-based tobacco use treatment provided by [facility]. Services are free, confidential, and available at your convenience. Trained counselors are available 24 hours a day, 7 days a week. Services are available in both [place] and [place].           Note: services can change, this summary was last updated 08/12/2020.      Call 1-800-QUIT-NOW (781-813-5724),  Text READY to 200-400, or go to www.QuitlineNC.com                  Tobacco Cessation Medications      Nicotine Replacement Therapy (NRT):   Nicotine is the addictive part of tobacco products, but not the most dangerous part. There are 7,000 other toxins in cigarettes, including carbon monoxide, that cause disease.   People do not generally become addicted to medication. NRT is not addictive in the same way tobacco products are.   Medications are safe and effective.   Recommended use is 3 months minimum.   Some combinations work better than single medications.   Long-acting medications like the nicotine patch provide continuous treatment for withdrawal symptoms.   PLUS   Short-acting medications like nicotine gum and lozenge help people cope with breakthrough cravings.      ? Nicotine Patch     Place patch anywhere above the waist, preferably upper arm.   Each day: discard old patch and apply new patch to the opposite arm.   Apply hydrocortisone cream if skin irritation develops. Call provider if rash develops.   Side effects may include skin irritation, headache, insomnia, abnormal/vivid dreams.   If patch causes sleep disturbance, remove patch at bedtime and replace each morning.      ? Nicotine Gum    Chew gum slowly until it becomes soft, then park in cheek when peppery taste or tingling sensation begins.   When taste or tingling goes away, begin chewing again.   Use until nicotine is gone (taste or tingle does not return, usually 30 minutes).   Park in different areas of the mouth. Nicotine is absorbed through the lining of the mouth.   Use enough to control cravings, up to 24 pieces per day (if used alone).   Avoid eating or drinking for 15 minutes before using and during use.   Side effects may include mouth/jaw soreness, hiccups, indigestion, hypersalivation.    If gum is chewed constantly rather than parked in cheek, additional side effects may include lightheadedness, nausea/vomiting, and mouth/throat irritation.      ? Nicotine Lozenge       Allow to dissolve slowly in mouth (20-30 minutes). Do not chew or swallow. Nicotine release may cause a warm tingling sensation.   Occasionally rotate to different areas of the mouth.   Use enough to control cravings, up to 20 lozenges per day (if used alone).   Avoid eating or drinking for 15 minutes before using and during use.   Side effects may include nausea, hiccups, cough, heartburn, headache, gas, insomnia.      Other cessation medications include nicotine nasal spray, Chantix (Varenicline), and Wellbutrin (Bupropion). These medications are available by prescription only. If you are interested in learning more about these medications or obtaining a prescription, please speak with your healthcare provider or reach out to the [facility] Treatment Program.            Web Resources   Free online support programs can help you track your progress and share experiences with others who are quitting.   www.becomeanex.org - create a customized quit plan and connect with an online community of individuals who use or have used tobacco.    www.smokefree.gov  - live chat with trained counselors and explore an array of resources to help you become tobacco free.   TightMarket.nl.aspx - use this step-by-step guide to get started on your journey to become tobacco free.  Resources and Referrals    Agency Name: [facility] [employer] and Human/Social Services Address: [phone], [place], [place] [address] Phone: [phone] Website: www.[email or web address].us Service(s) Offered: Sales executive, [facility] program    Agency Name: [employer] Address: [address] [place], [place] [address] Phone: [phone] Website: www.[place].com Services Offered: Transportation for a fee.    Recovery Assistance [facility]  [address]  [place], [place] [address]  Mailing Address:  [address]  [place] [address]  Phone: [phone]  Hours  Monday through Friday  8 a.m. to 5 p.m.               Pending Test Results  Pending Labs       Order Current Status    ABO/RH Collected (02/14/23 1207)          Future Appointments:  Appointments which have been scheduled for you      Mar 06, 2023 10:00 AM  (Arrive by 9:30 AM)  RETURN  GENERAL with Sharyn Lull, MD  [facility] MULTISPECIALTY SURGERY GI SURGERY [facility] [place] REGION) [address]  [place] [address]  [phone]        Mar 25, 2023 9:30 AM  (Arrive by 9:15 AM)  RETURN VIDEO DIRECT LINK with Luanne Bras, MD  [facility] [place] ([place])  Arrive at: This is a Video Visit [address]  [facility] 1 through 4  [place] [address]  [phone]   A direct link will be sent to you by your provider at the time of your video appointment. Please do NOT go to the clinic.     For your video visit, you will need a computer with a working camera, speaker and microphone, a smartphone, or a tablet with internet access.

## 2023-02-24 MED ORDER — ENOXAPARIN 40 MG/0.4 ML SUBCUTANEOUS SYRINGE
SUBCUTANEOUS | 0 refills | 28.00 days | Status: CP
Start: 2023-02-24 — End: 2023-03-24
  Filled 2023-02-23: qty 11.2, 28d supply, fill #0

## 2023-02-25 NOTE — Unmapped (Addendum)
 Telephone call from pt. He is concerned that he was switched from xanax to valium and also that suboxone was increased. He was also discharged with only 3 days supply of pain meds. He does not have an appt with dr. Lauris Chroman psychiatry as documented on discharge summary and so he is concerned that he will be out of medications. He also states that dr. Kerrie Pleasure does not prescribe narcotics and they didn't understand why suboxone was increased and why he was switched from xanax to valium. Advised that this was recommendation from chronic pain team. He also gave permision to speak to [facility]. I spoke to [employer] and she will schedule appt for him with Dr. Kerrie Pleasure. Advised that I will speak to dr. Milbert Coulter and get back with him.

## 2023-02-28 DIAGNOSIS — K50119 Crohn's disease of large intestine with unspecified complications: Secondary | ICD-10-CM | POA: Diagnosis not present

## 2023-02-28 DIAGNOSIS — K50912 Crohn's disease, unspecified, with intestinal obstruction: Secondary | ICD-10-CM | POA: Diagnosis not present

## 2023-03-05 NOTE — Unmapped (Signed)
 Postoperative follow up visit    Dakota Townsend underwent a redo ileocolic resection on 02/13/2013.  There was a stricture at the previous ileocolic anastomosis with 30 cm of diseased bowel with associated ulcerations.  His hospital course was relatively uncomplicated, though pain control was a significant issue.  He was discharged to home on postoperative day 9 in good condition.  He was discharged with 3 days of pain medication as his psychiatrists prescribe his pain medication.  Today he reports that he is doing well overall.  He is having regular bowel movements.  He continues to report pain but is being maintained on Suboxone alone    BP 113/83 (BP Position: Sitting, BP Cuff Size: Large)  - Pulse 83  - Temp 36.2 ??C (97.1 ??F) (Temporal)  - Ht 180.3 cm (5' 11)  - Wt 67.2 kg (148 lb 1.6 oz)  - BMI 20.66 kg/m??    General: comfortable and in no acute distress  Abdomen: soft, NT, ND  Incisions: healed, no evidence of infection     -Overall Dakota Townsend doing well  -Resume Cristy Folks (next dose is due in March, discussed with Dr Stevphen Rochester)  -Return to clinic as needed    Orlin Hilding, MD  Colorectal Surgery

## 2023-03-06 ENCOUNTER — Ambulatory Visit: Admit: 2023-03-06 | Discharge: 2023-03-07 | Payer: MEDICARE

## 2023-03-06 DIAGNOSIS — K50019 Crohn's disease of small intestine with unspecified complications: Principal | ICD-10-CM

## 2023-03-06 DIAGNOSIS — Z09 Encounter for follow-up examination after completed treatment for conditions other than malignant neoplasm: Secondary | ICD-10-CM | POA: Diagnosis not present

## 2023-03-07 DIAGNOSIS — K50912 Crohn's disease, unspecified, with intestinal obstruction: Principal | ICD-10-CM

## 2023-03-07 MED ORDER — SKYRIZI 360 MG/2.4 ML (150 MG/ML) SUBCUTANEOUS WEARABLE INJECTOR
2 refills | 0.00 days
Start: 2023-03-07 — End: ?

## 2023-03-07 NOTE — Unmapped (Signed)
 [facility] Clinical Assessment & Refill Coordination Note    Dakota Townsend, DOB: 11/20/1980  Phone: [phone] (home)     All above HIPAA information was verified with patient.     Was a Nurse, learning disability used for this call? No    Specialty Medication(s):   Inflammatory Disorders: Skyrizi     Current Outpatient Medications   Medication Sig Dispense Refill    acetaminophen (TYLENOL) 500 MG tablet Take 2 tablets (1,000 mg total) by mouth every six (6) hours. 30 tablet 0    ALPRAZolam (XANAX) 0.5 MG tablet Take 1 tablet (0.5 mg total) by mouth two (2) times a day.      buprenorphine-naloxone (SUBOXONE) 8-2 mg sublingual film DISSOLVE 3 FILMS UNDER THE TONGUE ONCE DAILY      divalproex ER (DEPAKOTE ER) 500 MG extended released 24 hr tablet TAKE 2 TABLETS BY MOUTH WITH SUPPER DAILY      empty container Misc Use as directed 1 each 3    enoxaparin (LOVENOX) 40 mg/0.4 mL Syrg Inject 0.4 mL (40 mg total) under the skin daily for 28 days. 11.2 mL 0    gabapentin (NEURONTIN) 300 MG capsule Take 1 capsule (300 mg total) by mouth Three (3) times a day for 14 days. 42 capsule 0    lamoTRIgine (LAMICTAL) 200 MG tablet Take 1 tablet (200 mg total) by mouth.      naloxone (NARCAN) 4 mg nasal spray One spray in either nostril once for known/suspected opioid overdose. May repeat every 2-3 minutes in alternating nostril til EMS arrives 2 each 0    nicotine (NICODERM CQ) 21 mg/24 hr patch Place 1 patch on the skin daily. 28 patch 0    nicotine polacrilex (NICORETTE MINI) lozenge 4 MG Apply 1 lozenge (4 mg total) to cheek every hour as needed for smoking cessation. 72 each 0    nicotine polacrilex (NICORETTE) 4 MG gum Apply 1 each (4 mg total) to cheek every hour as needed for smoking cessation. 110 each 0    ondansetron (ZOFRAN-ODT) 4 MG disintegrating tablet Dissolve 1 tablet (4 mg total) in the mouth every eight (8) hours as needed for nausea. 12 tablet 5     No current facility-administered medications for this visit.        Changes to medications: Dakota Townsend reports no changes at this time.    Medication list has been reviewed and updated in Epic: Yes    Allergies   Allergen Reactions    Acetaminophen Nausea And Vomiting    Benadryl [Diphenhydramine Hcl] Anaphylaxis    Sulfa (Sulfonamide Antibiotics) Hives    Rozerem [Ramelteon] Other (See Comments)     Pt reports hallucinations, muscle contortions  & dry eyes    Shellfish Containing Products Diarrhea and Nausea And Vomiting     Can have shrimp but not scallops       Changes to allergies: No    Allergies have been reviewed and updated in Epic: Yes    SPECIALTY MEDICATION ADHERENCE     Skyrizi 360  mg/2.59mL : 0 doses of medicine on hand   Medication Adherence    Patient reported X missed doses in the last month: 0  Specialty Medication: Skyrizi 360mg /2.87mL -1q8w  Patient is on additional specialty medications: No  Informant: patient          Specialty medication(s) dose(s) confirmed: Regimen is correct and unchanged.     Are there any concerns with adherence? No  Adherence counseling provided? Not needed    CLINICAL MANAGEMENT AND INTERVENTION      Clinical Benefit Assessment:    Do you feel the medicine is effective or helping your condition? Yes    Clinical Benefit counseling provided? Not needed    Adverse Effects Assessment:    Are you experiencing any side effects? No    Are you experiencing difficulty administering your medicine? No    Quality of Life Assessment:    Quality of Life    Rheumatology  Oncology  Dermatology  Cystic Fibrosis          How many days over the past month did your crohn's  keep you from your normal activities? For example, brushing your teeth or getting up in the morning. 0    Have you discussed this with your provider? Not needed    Acute Infection Status:    Acute infections noted within Epic:  No active infections    Patient reported infection: None    Therapy Appropriateness:    Is therapy appropriate based on current medication list, adverse reactions, adherence, clinical benefit and progress toward achieving therapeutic goals? Yes, therapy is appropriate and should be continued     Clinical Intervention:    Was an intervention completed as part of this clinical assessment? No    DISEASE/MEDICATION-SPECIFIC INFORMATION      For patients on injectable medications: Patient currently has 0 doses left.  Next injection is scheduled for 3/13.    Chronic Inflammatory Diseases: Have you experienced any flares in the last month? No  Has this been reported to your provider? Not applicable    PATIENT SPECIFIC NEEDS     Does the patient have any physical, cognitive, or cultural barriers? No    Is the patient high risk? No    Does the patient require physician intervention or other additional services (i.e., nutrition, smoking cessation, social work)? No    Does the patient have an additional or emergency contact listed in their chart? Yes    SOCIAL DETERMINANTS OF HEALTH     At the [facility], we have learned that life circumstances - like trouble affording food, housing, utilities, or transportation can affect the health of many of our patients.   That is why we wanted to ask: are you currently experiencing any life circumstances that are negatively impacting your health and/or quality of life? No    Social Drivers of Health     Food Insecurity: No Food Insecurity (02/15/2023)    Hunger Vital Sign     Worried About Running Out of Food in the Last Year: Never true     Ran Out of Food in the Last Year: Never true   Internet Connectivity: Not on file   Housing/Utilities: Low Risk  (02/15/2023)    Housing/Utilities     Within the past 12 months, have you ever stayed: outside, in a car, in a tent, in an overnight shelter, or temporarily in someone else's home (i.e. couch-surfing)?: No     Are you worried about losing your housing?: No     Within the past 12 months, have you been unable to get utilities (heat, electricity) when it was really needed?: No Tobacco Use: High Risk (03/06/2023)    Patient History     Smoking Tobacco Use: Every Day     Smokeless Tobacco Use: Never     Passive Exposure: Not on file   Transportation Needs: No Transportation Needs (02/15/2023)  PRAPARE - Therapist, art (Medical): No     Lack of Transportation (Non-Medical): No   Alcohol Use: Not on file   Interpersonal Safety: Not on file   Physical Activity: Inactive (08/28/2021)    Received from [facility], [facility]    Exercise Vital Sign     Days of Exercise per Week: 0 days     Minutes of Exercise per Session: 0 min   Intimate Partner Violence: Not At Risk (08/28/2021)    Received from [facility], [facility]    Humiliation, Afraid, Rape, and Kick questionnaire     Fear of Current or Ex-Partner: No     Emotionally Abused: No     Physically Abused: No     Sexually Abused: No   Stress: No Stress Concern Present (08/28/2021)    Received from [facility], [facility]    [employer] of Occupational Health - Occupational Stress Questionnaire     Feeling of Stress : Not at all   Substance Use: Not on file (11/18/2022)   Social Connections: Moderately Integrated (08/28/2021)    Received from [facility], [facility]    Social Connection and Isolation Panel [NHANES]     Frequency of Communication with Friends and Family: More than three times a week     Frequency of Social Gatherings with Friends and Family: More than three times a week     Attends Religious Services: More than 4 times per year     Active Member of [employer] or Organizations: No     Attends Engineer, structural: More than 4 times per year     Marital Status: Never married   Physicist, medical Strain: Low Risk  (02/15/2023)    Overall Financial Resource Strain (CARDIA)     Difficulty of Paying Living Expenses: Not very hard   Depression: Not on file   Health Literacy: Not on file       Would you be willing to receive help with any of the needs that you have identified today? Not applicable SHIPPING     Specialty Medication(s) to be Shipped:   Inflammatory Disorders: Skyrizi    Other medication(s) to be shipped: No additional medications requested for fill at this time     Changes to insurance: No    Delivery Scheduled: Yes, Expected medication delivery date: 3/5.     Medication will be delivered via UPS to the confirmed prescription address in [facility].    The patient will receive a drug information handout for each medication shipped and additional FDA Medication Guides as required.  Verified that patient has previously received a Conservation officer, historic buildings and a Surveyor, mining.    The patient or caregiver noted above participated in the development of this care plan and knows that they can request review of or adjustments to the care plan at any time.      All of the patient's questions and concerns have been addressed.    Teofilo Pod, PharmD   [facility] Specialty Pharmacist

## 2023-03-08 DIAGNOSIS — K50912 Crohn's disease, unspecified, with intestinal obstruction: Principal | ICD-10-CM

## 2023-03-08 MED ORDER — RISANKIZUMAB-RZAA 360 MG/2.4 ML (150 MG/ML) SUBCUT WEARABLE INJECTOR
SUBCUTANEOUS | 3 refills | 0.00 days | Status: CP
Start: 2023-03-08 — End: ?
  Filled 2023-03-12: qty 2.4, 56d supply, fill #0

## 2023-03-08 MED ORDER — SKYRIZI 360 MG/2.4 ML (150 MG/ML) SUBCUTANEOUS WEARABLE INJECTOR
2 refills | 0.00 days | Status: CP
Start: 2023-03-08 — End: ?

## 2023-03-08 NOTE — Unmapped (Signed)
 Per note from Dr. Milbert Coulter, patient is to resume skyrizi post op. Refills sent.

## 2023-03-08 NOTE — Unmapped (Signed)
 MEDICATION ORDER ENTRY DOCUMENTATION    The following medication(s) have been sent to pharmacy for insurance authorization:    Medication: risankizumab OBI  Dose & Frequency: 360 mg SQ every 8 weeks  Pharmacy: [facility]       Requesting Physician: Dr. Noel Gerold    Notes: medication discontinued during inpatient discharge med rec but patient to continue taking Skyrizi post-surgery due to complicated, severe Crohn's disease      Edsel Petrin, PharmD, BCPS, CPP  GI/IBD - Clinical Pharmacist Practitioner

## 2023-03-11 DIAGNOSIS — K50912 Crohn's disease, unspecified, with intestinal obstruction: Principal | ICD-10-CM

## 2023-03-11 MED ORDER — GABAPENTIN 300 MG CAPSULE
ORAL_CAPSULE | Freq: Three times a day (TID) | ORAL | 0 refills | 7.00 days | Status: CP
Start: 2023-03-11 — End: 2023-03-18

## 2023-03-22 NOTE — Unmapped (Signed)
 REASON FOR VISIT:  Crohn's ileitis    HISTORY OF PRESENT ILLNESS:  Since last visit,  02/05/2023, colonoscopy to ICA.  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  Path showed stricturing with deep ulcerations.  Postop recovery complicated by pain.  02/22/2023, CMP, CBC normal except hemoglobin 8.5, albumin 3.1, AST 44, ALT 59, alk phos 161.    Last skyrizi dose 2 months ago. Next dose due today. Currently, still recovering from surgery. Asking about gabapentin refill x 1 week for abdominal pain that has worsened since stopping gabapentin a few days ago. Periumbilical, burning, stinging, itching, constant, but worse with movement, better with rest. Not related to food or bms. No n/v. Stable wt. Eating well. Overall feels like surgery has improved prior obstructive symptoms. Has 1 bm every 1-2 days. Not taking antidiarrheals.    MEDICATIONS:  has a current medication list which includes the following prescription(s): acetaminophen, alprazolam, buprenorphine-naloxone, divalproex er, empty container, enoxaparin, lamotrigine, naloxone, nicotine, nicotine polacrilex, nicotine polacrilex, ondansetron, skyrizi, risankizumab-rzaa, and gabapentin.    ALLERGIES:  Allergies as of 03/25/2023 - Reviewed 03/22/2023   Allergen Reaction Noted    Acetaminophen Nausea And Vomiting 04/27/2012    Benadryl [diphenhydramine hcl] Anaphylaxis 04/27/2012    Sulfa (sulfonamide antibiotics) Hives 04/27/2012    Rozerem [ramelteon] Other (See Comments) 05/11/2014    Shellfish containing products Diarrhea and Nausea And Vomiting 06/04/2011       PAST MEDICAL HISTORY:  1. Crohn's disease diagnosed as ileocolitis in 2007, treated initially with Pentasa and prednisone. Infliximab with partial response. Imuran with no help. Infusion reaction during infliximab retreatment. Adalimumab provided no benefit. Certolizumab with mild improvement but plateaued despite once weekly  dosing. In 12/2007, CT with ileitis, internal fistula and abscess. 02/04/08, ielocolic resection (40cm ti) by Dr. [address] Sever in [place]. 07/2008, diarrhea. 05/03/09, thickening of the neoterminal ileum on CT scan. 6-MP started 50 mg per day. 05/03/09, start adalimumab. 06/01/09, 6-MP increased to 75 mg. 07/19/09, desipramine to 100 mg per day without benefit. 11/10/2009, 6 inches of ileal resection (stricture) with primary anastomosis. 12/20/2009, reluctant to restart immunosuppression. 03/21/10, stopped desipramine due to lack of benefit and start Cymbalta. 05/25/10 Colonoscopy with i3 recurrence. Restart 6-MP 75mg , but declined adalimumab due to toe infections. 03/01/11, 6 inches of small bowel resected just proximal to the anastomosis. 05/22/11 start oral methotrexate. 06/04/11, CT abdomen and pelvis showed small-bowel dilation to 3.4 cm, prominent thickening of the ileocolonic anastomosis. Start ustekinumab. 10/26/11, colonoscopy showed minimal improvement. MTX switched to 25mg  Cricket.  02/20/13, colonoscopy showed I2 disease (possibly mildly improved).  Patient self stopped methotrexate due to perceived increase infectious complications.  Continued ustekinumab monotherapy.  2017, stopped ustekinumab due to incarceration.  2018, worsening symptoms, start adalimumab in prison with improvement.  08/2019, stopped adalimumab upon release from prison.  Worsening symptoms. 09/08/2020, start 6mp 75mg  daily. 12/06/2020, start adalimumab. 07/27/21, strep intermedius empyema of the left lung. and adalimumab stopped. 11/21/2021, resume 6mp 75mg . 12/06/2021, resume adalimumab. 07/29/2022, CTE with 20 cm active ileitis and proximal dilatation. 08/08/2022, change adalimumab/6-MP to upadacitinib 45 mg. 11/01/2022, persistent disease on CT,  change upadacitinib to risankizumab. 02/14/2023, ileocolonic resection (30 cm TI).  Path showed stricturing with deep ulcerations.  2. 06/2014, pilonidal cyst excision.  05/01/2021, pilonidal cyst excision.  12/06/2021, resume adalimumab.  3. Bipolar disorder  4. Substance use disorder  5. 09/08/20, PCV-20  6.  09/2020, QuantiFERON gold positive. 10/28/2020, [facility] health department determined patient did not need treatment for  LTBI based on 2 repeat negative tests.  7.  02/09/2021, Shingrix No. 1, #2 on 12/28/21    SOCIAL HISTORY:  Continues to smoke cigarettes (1 pack per week)    FAMILY HISTORY:  No colon cancer    PHYSICAL EXAM:  There were no vitals taken for this visit.  Wt Readings from Last 4 Encounters:   03/06/23 67.2 kg (148 lb 1.6 oz)   02/14/23 68.4 kg (150 lb 12.7 oz)   02/11/23 68.2 kg (150 lb 5.7 oz)   02/05/23 72.6 kg (160 lb)     CONSTITUTIONAL: awake, alert, NAD    TEST DATA:  1. 05/03/09, CT scan with thickening of neoterminal ileum and subcm mesenteric lymphadenopathy. No stranding, abscess, or fistula.   2.08/26/2009, EGD was normal.   3.08/26/2009, colonoscopy to the neoterminal ileum. Poor bowel prep. Edema, erythema and shallow ulcerations at ICA and neoterminal ileum. Benign-appearing mild stenosis just proximal to ICA that was not traversed.  4. 10/28/2009, small bowel follow through showed 4-hour transit time, adherent small bowel loops in the pelvis that were difficult to separate, 1.5-2 cm segment of stricture in the neoterminal ileum that was 4 mm in diameter, more proximal 2.25 cm of neoterminal ileum was mildly narrowed.   5. 05/20/10, colonoscopy to neoterminal ileum with poor prep, i3 recurrence.   6. 02/07/2011, colonoscopy to 5 cm in the terminal ileum showed a linear ulcer at the ileocolonic anastomosis and severe ulceration in the neoterminal ileum that was worse than before. Terminal ileum could not be intubated more deeply due to edema and/or stricturing.   7. 06/04/11, CT abdomen and pelvis showed small-bowel dilatation to 3.4 cm, gas and stool in colon, prominent thickening at the ileocolonic anastomosis  8. 10/26/11 Colonoscopy- ICA with linear ulceration confined to the anastamotic line. >5 medium sized aphthous ulcers with normal intervening mucosa in the neoti. Slightly improved compared to prior pre-op colonoscopy.   9. 02/20/13, colonoscopy. Normal ICA. i2 disease in the distal 6 cm of TI.  Slightly improved.  10. 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  09/12/2020, CTAP.  Gallbladder wall thickening.  Marked thickening of the terminal ileum x20 cm.  2 cm mesenteric lymphadenopathy  12.  09/12/2020, right upper quadrant ultrasound.  Gallbladder sludge, wall thickening.  13.  09/14/2020, HIDA scan.  Normal.  14. 07/18/2021, colonoscopy to TI x1 cm.  Fair prep.  Nondraining sinus tract versus fistula on perianal exam.  Edema, small superficial ulcers and mild stenosis (1 cm) at ileocolonic anastomosis.  Dilated to 13.5 mm.  Anastomosis was traversed, but deeper intubation of the ileum was prevented by looping.   15. 07/05/2022, adalimumab 13.3, CRP 4mg /L  16. 07/29/2022, CTE.  20 cm segment of wall thickening, hyperenhancement.  Marked luminal narrowing of neo-TI at anastomosis with mild upstream bowel dilatation measuring 3.2 cm.  Questionable wall thickening and narrowing in the sigmoid colon.  1.1 cm MLN.  17. 09/06/2022, Fecal calprotectin 276  18. 10/22/2022, CTE.  4.5 cm enhancement and mild narrowing of neo-TI with proximal dilatation up to 3.1 cm.  Short segment 2.9 cm thickening and enhancement of the rectosigmoid.  19. 02/05/2023, colonoscopy to ICA.  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 Ileal Crohn's disease.  Stricturing phenotype.  Periumbilical burning pain is likely due to neuropathic pain from recent surgery.  Crohn's disease is in surgical remission right now.  Recommend continuing risankizumab every 2 months to maintain remission.  Restage disease activity check for early endoscopic recurrence in about 9 months.  Reviewed risks of risankizumab including risk of infection.  2.  Tobacco use disorder.  Smoking cessation counseling.    3. Medically immune suppressed.  In need of vaccination.    RECOMMENDATIONS AND PLAN:  Patient Instructions   -Continue Skyrizi every 2 months as before  -Continue efforts to stop smoking  -Anticipate repeat colonoscopy in about 9 months to restage disease activity.  -I will send in refill for gabapentin x 1 wk  -Consider PCV-21 pneumonia vaccine (once), seasonal flu shot (annually) each October, and tetanus booster (once every 10 years) vaccines. You can get these at your local pharmacy. I will send in rx for pneumonia vaccine to your local pharmacy. You just need to call to schedule appointment.  -Telehealth visit with me in 6 months or sooner if needed      The patient reports they are physically located in [place] and is currently: at home. I conducted a audio/video visit. I spent  43m 17s on the video call with the patient. I spent an additional 10 minutes on pre- and post-visit activities on the date of service .

## 2023-03-25 ENCOUNTER — Encounter: Admit: 2023-03-25 | Discharge: 2023-03-26 | Payer: MEDICARE | Attending: Gastroenterology | Primary: Gastroenterology

## 2023-03-25 DIAGNOSIS — K50012 Crohn's disease of small intestine with intestinal obstruction: Principal | ICD-10-CM

## 2023-03-25 MED ORDER — GABAPENTIN 300 MG CAPSULE
ORAL_CAPSULE | Freq: Three times a day (TID) | ORAL | 0 refills | 7.00 days | Status: CP
Start: 2023-03-25 — End: 2023-04-01

## 2023-03-25 MED ORDER — PNEUMOCOCCAL 21-VALENT CONJ VACCINE-DIP CRM (PF) 0.5 ML IM SYRINGE
Freq: Once | INTRAMUSCULAR | 0 refills | 1.00 days | Status: CP
Start: 2023-03-25 — End: 2023-03-25

## 2023-03-25 NOTE — Unmapped (Addendum)
-  Continue Skyrizi every 2 months as before  -Continue efforts to stop smoking  -Anticipate repeat colonoscopy in about 9 months to restage disease activity.  -I will send in refill for gabapentin x 1 wk  -Consider PCV-21 pneumonia vaccine (once), seasonal flu shot (annually) each October, and tetanus booster (once every 10 years) vaccines. You can get these at your local pharmacy. I will send in rx for pneumonia vaccine to your local pharmacy. You just need to call to schedule appointment.  -Telehealth visit with me in 6 months or sooner if needed

## 2023-04-08 DIAGNOSIS — K50912 Crohn's disease, unspecified, with intestinal obstruction: Principal | ICD-10-CM

## 2023-04-30 NOTE — Unmapped (Signed)
 [facility] Home Delivery Pharmacy Refill Coordination Note    Specialty Medication(s) to be Shipped:   Inflammatory Disorders: Skyrizi     Other medication(s) to be shipped: No additional medications requested for fill at this time     Dakota Townsend, DOB: 04/09/1980  Phone: [phone] (home)       All above HIPAA information was verified with patient.     Was a Nurse, learning disability used for this call? No    Completed refill call assessment today to schedule patient's medication shipment from the [facility] and [facility] Delivery Pharmacy  [phone]).  All relevant notes have been reviewed.     Specialty medication(s) and dose(s) confirmed: Regimen is correct and unchanged.   Changes to medications: Torez reports no changes at this time.  Changes to insurance: No  New side effects reported not previously addressed with a pharmacist or physician: None reported  Questions for the pharmacist: No    Confirmed patient received a Conservation officer, historic buildings and a Surveyor, mining with first shipment. The patient will receive a drug information handout for each medication shipped and additional FDA Medication Guides as required.       DISEASE/MEDICATION-SPECIFIC INFORMATION        For patients on injectable medications: Patient currently has 0 doses left.  Next injection is scheduled for 5/16.    SPECIALTY MEDICATION ADHERENCE     Medication Adherence    Patient reported X missed doses in the last month: 0  Specialty Medication: SKYRIZI  360 mg/2.4 mL  Patient is on additional specialty medications: No              Were doses missed due to medication being on hold? No    Skyrizi  360/2.4 mg/ml: 0 doses of medicine on hand        REFERRAL TO PHARMACIST     Referral to the pharmacist: Not needed      [facility]     Shipping address confirmed in Epic.     Cost and Payment: Patient has a $0 copay, payment information is not required.    Delivery Scheduled: Yes, Expected medication delivery date: 05/03/23.     Medication will be delivered via UPS to the prescription address in Epic WAM.    [name]   [facility] and [facility] Delivery Pharm[facility]  Specialty Technician

## 2023-05-02 MED FILL — SKYRIZI 360 MG/2.4 ML (150 MG/ML) SUBCUTANEOUS WEARABLE INJECTOR: SUBCUTANEOUS | 56 days supply | Qty: 2.4 | Fill #1

## 2023-06-21 NOTE — Unmapped (Signed)
 [facility] and Home Delivery Pharmacy Refill Coordination Note    Specialty Medication(s) to be Shipped:   Inflammatory Disorders: Skyrizi     Other medication(s) to be shipped: No additional medications requested for fill at this time     Dakota Townsend, DOB: November 10, 1980  Phone: [phone] (home)       All above HIPAA information was verified with patient.     Was a Nurse, learning disability used for this call? No    Completed refill call assessment today to schedule patient's medication shipment from the [facility] and Home Delivery Pharmacy  [phone]).  All relevant notes have been reviewed.     Specialty medication(s) and dose(s) confirmed: Regimen is correct and unchanged.   Changes to medications: Dakota Townsend reports no changes at this time.  Changes to insurance: No  New side effects reported not previously addressed with a pharmacist or physician: None reported  Questions for the pharmacist: No    Confirmed patient received a Conservation officer, historic buildings and a Surveyor, mining with first shipment. The patient will receive a drug information handout for each medication shipped and additional FDA Medication Guides as required.       DISEASE/MEDICATION-SPECIFIC INFORMATION        For patients on injectable medications: Patient currently has 0 doses left.  Next injection is scheduled for 7/11.    SPECIALTY MEDICATION ADHERENCE     Medication Adherence    Patient reported X missed doses in the last month: 0  Specialty Medication: SKYRIZI  360 mg/2.4 mL  Patient is on additional specialty medications: No              Were doses missed due to medication being on hold? No    Skyrizi  360/2.4mg /ml: 0 doses of medicine on hand        REFERRAL TO PHARMACIST     Referral to the pharmacist: Not needed      [facility]     Shipping address confirmed in Epic.     Cost and Payment: Patient has a $0 copay, payment information is not required.    Delivery Scheduled: Yes, Expected medication delivery date: 06/27/23.     Medication will be delivered via UPS to the prescription address in Epic WAM.    [name]   [facility] and Home Delivery Pharmacy  Specialty Technician

## 2023-06-26 MED FILL — SKYRIZI 360 MG/2.4 ML (150 MG/ML) SUBCUTANEOUS WEARABLE INJECTOR: SUBCUTANEOUS | 56 days supply | Qty: 2.4 | Fill #2

## 2023-07-26 DIAGNOSIS — K50912 Crohn's disease, unspecified, with intestinal obstruction: Secondary | ICD-10-CM | POA: Diagnosis not present

## 2023-07-27 LAB — CBC W/ DIFFERENTIAL
BANDED NEUTROPHILS ABSOLUTE COUNT: 0 x10E3/uL (ref 0.0–0.1)
BASOPHILS ABSOLUTE COUNT: 0.1 x10E3/uL (ref 0.0–0.2)
BASOPHILS RELATIVE PERCENT: 1 %
EOSINOPHILS ABSOLUTE COUNT: 0.3 x10E3/uL (ref 0.0–0.4)
EOSINOPHILS RELATIVE PERCENT: 3 %
HEMATOCRIT: 37.6 % (ref 37.5–51.0)
HEMOGLOBIN: 11.6 g/dL — ABNORMAL LOW (ref 13.0–17.7)
IMMATURE GRANULOCYTES: 0 %
LYMPHOCYTES ABSOLUTE COUNT: 2.3 x10E3/uL (ref 0.7–3.1)
LYMPHOCYTES RELATIVE PERCENT: 23 %
MEAN CORPUSCULAR HEMOGLOBIN CONC: 30.9 g/dL — ABNORMAL LOW (ref 31.5–35.7)
MEAN CORPUSCULAR HEMOGLOBIN: 28.6 pg (ref 26.6–33.0)
MEAN CORPUSCULAR VOLUME: 93 fL (ref 79–97)
MONOCYTES ABSOLUTE COUNT: 0.6 x10E3/uL (ref 0.1–0.9)
MONOCYTES RELATIVE PERCENT: 6 %
NEUTROPHILS ABSOLUTE COUNT: 6.5 x10E3/uL (ref 1.4–7.0)
NEUTROPHILS RELATIVE PERCENT: 67 %
PLATELET COUNT: 281 x10E3/uL (ref 150–450)
RED BLOOD CELL COUNT: 4.06 x10E6/uL — ABNORMAL LOW (ref 4.14–5.80)
RED CELL DISTRIBUTION WIDTH: 14.6 % (ref 11.6–15.4)
WHITE BLOOD CELL COUNT: 9.9 x10E3/uL (ref 3.4–10.8)

## 2023-07-27 LAB — ALT: ALT (SGPT): 32 IU/L (ref 0–44)

## 2023-08-07 DIAGNOSIS — Z5181 Encounter for therapeutic drug level monitoring: Secondary | ICD-10-CM | POA: Diagnosis not present

## 2023-08-07 DIAGNOSIS — Z79899 Other long term (current) drug therapy: Secondary | ICD-10-CM | POA: Diagnosis not present

## 2023-08-14 NOTE — Unmapped (Signed)
 ERROR

## 2023-08-29 NOTE — Unmapped (Signed)
 [facility] Specialty and Home Delivery Pharmacy Refill Coordination Note    Specialty Medication(s) to be Shipped:   Inflammatory Disorders: Skyrizi     Other medication(s) to be shipped: No additional medications requested for fill at this time    Specialty Medications not needed at this time: N/A     Dakota Townsend, DOB: 1980/05/10  Phone: [phone] (home)       All above HIPAA information was verified with patient.     Was a Nurse, learning disability used for this call? No    Completed refill call assessment today to schedule patient's medication shipment from the [facility] and Home Delivery Pharmacy  [phone]).  All relevant notes have been reviewed.     Specialty medication(s) and dose(s) confirmed: Regimen is correct and unchanged.   Changes to medications: Dakota Townsend reports no changes at this time.  Changes to insurance: No  New side effects reported not previously addressed with a pharmacist or physician: None reported  Questions for the pharmacist: No    Confirmed patient received a Conservation officer, historic buildings and a Surveyor, mining with first shipment. The patient will receive a drug information handout for each medication shipped and additional FDA Medication Guides as required.       DISEASE/MEDICATION-SPECIFIC INFORMATION        For patients on injectable medications: Next injection is scheduled for 9/4.    SPECIALTY MEDICATION ADHERENCE     Medication Adherence    Patient reported X missed doses in the last month: 0  Specialty Medication: SKYRIZI  360 mg/2.4 mL  Patient is on additional specialty medications: No              Were doses missed due to medication being on hold? No    Skyrizi  360/2.4 mg/ml: 0 doses of medicine on hand        REFERRAL TO PHARMACIST     Referral to the pharmacist: Not needed      [facility]     Shipping address confirmed in Epic.     Cost and Payment: Patient has a $0 copay, payment information is not required.    Delivery Scheduled: Yes, Expected medication delivery date: 09/05/23. Medication will be delivered via UPS to the prescription address in Epic WAM.    Dakota Townsend   [facility] and Home Delivery Pharmacy  Specialty Technician

## 2023-09-04 MED FILL — SKYRIZI 360 MG/2.4 ML (150 MG/ML) SUBCUTANEOUS WEARABLE INJECTOR: SUBCUTANEOUS | 56 days supply | Qty: 2.4 | Fill #3

## 2023-09-05 MED ORDER — BUDESONIDE DR - ER 3 MG CAPSULE,DELAYED,EXTENDED RELEASE
ORAL_CAPSULE | Freq: Every morning | ORAL | 0 refills | 30.00000 days | Status: CP
Start: 2023-09-05 — End: 2024-09-04

## 2023-09-06 MED ORDER — LOPERAMIDE 2 MG TABLET
ORAL_TABLET | ORAL | 3 refills | 0.00000 days | Status: CP
Start: 2023-09-06 — End: ?

## 2023-09-10 DIAGNOSIS — K50012 Crohn's disease of small intestine with intestinal obstruction: Principal | ICD-10-CM

## 2023-09-11 DIAGNOSIS — K50012 Crohn's disease of small intestine with intestinal obstruction: Secondary | ICD-10-CM | POA: Diagnosis not present

## 2023-09-12 LAB — COMPREHENSIVE METABOLIC PANEL
ALBUMIN: 4.7 g/dL (ref 4.1–5.1)
ALKALINE PHOSPHATASE: 144 IU/L — ABNORMAL HIGH (ref 44–121)
ALT (SGPT): 17 IU/L (ref 0–44)
AST (SGOT): 19 IU/L (ref 0–40)
BILIRUBIN TOTAL (MG/DL) IN SER/PLAS: 0.3 mg/dL (ref 0.0–1.2)
BLOOD UREA NITROGEN: 13 mg/dL (ref 6–24)
BUN / CREAT RATIO: 14 (ref 9–20)
CALCIUM: 9.2 mg/dL (ref 8.7–10.2)
CHLORIDE: 101 mmol/L (ref 96–106)
CO2: 25 mmol/L (ref 20–29)
CREATININE: 0.91 mg/dL (ref 0.76–1.27)
EGFR: 109 mL/min/1.73
GLOBULIN, TOTAL: 2.4 g/dL (ref 1.5–4.5)
GLUCOSE: 74 mg/dL (ref 70–99)
POTASSIUM: 4.8 mmol/L (ref 3.5–5.2)
SODIUM: 141 mmol/L (ref 134–144)
TOTAL PROTEIN: 7.1 g/dL (ref 6.0–8.5)

## 2023-09-12 LAB — CBC
HEMATOCRIT: 39 % (ref 37.5–51.0)
HEMOGLOBIN: 12.3 g/dL — ABNORMAL LOW (ref 13.0–17.7)
MEAN CORPUSCULAR HEMOGLOBIN CONC: 31.5 g/dL (ref 31.5–35.7)
MEAN CORPUSCULAR HEMOGLOBIN: 29.2 pg (ref 26.6–33.0)
MEAN CORPUSCULAR VOLUME: 93 fL (ref 79–97)
PLATELET COUNT: 339 x10E3/uL (ref 150–450)
RED BLOOD CELL COUNT: 4.21 x10E6/uL (ref 4.14–5.80)
RED CELL DISTRIBUTION WIDTH: 13.9 % (ref 11.6–15.4)
WHITE BLOOD CELL COUNT: 11.3 x10E3/uL — ABNORMAL HIGH (ref 3.4–10.8)

## 2023-09-12 LAB — VITAMIN B12: VITAMIN B-12: 456 pg/mL (ref 232–1245)

## 2023-09-12 LAB — VITAMIN D 25 HYDROXY: VITAMIN D 25-HYDROXY: 39.4 ng/mL (ref 30.0–100.0)

## 2023-09-12 LAB — IRON & TIBC
IRON SATURATION: 20 % (ref 15–55)
IRON: 68 ug/dL (ref 38–169)
TOTAL IRON BINDING CAPACITY: 342 ug/dL (ref 250–450)
UNSATURATED IRON BINDING CAPACITY: 274 ug/dL (ref 111–343)

## 2023-09-12 LAB — SEDIMENTATION RATE: ERYTHROCYTE SEDIMENTATION RATE: 6 mm/h (ref 0–15)

## 2023-09-12 LAB — FERRITIN: FERRITIN: 32 ng/mL (ref 30–400)

## 2023-09-12 LAB — C-REACTIVE PROTEIN: C-REACTIVE PROTEIN: 2 mg/L (ref 0–10)

## 2023-09-20 NOTE — Unmapped (Addendum)
-  Continue Skyrizi  every 2 months as before  -Reduce Entocort to 6mg  daily.  -Continue ondansetron  as needed for nausea.  -Continue efforts to stop smoking  -Colonoscopy to restage Crohn's disease in the next 2 months. Please call [phone], option 1, then option 2 to schedule.  -Consider PCV-21 pneumonia vaccine (once), seasonal flu shot (annually) each October, and tetanus booster (once every 10 years) vaccines. You can get these at your local pharmacy. I will send in rx for pneumonia vaccine to your local pharmacy. You just need to call to schedule appointment.  -Telehealth visit with me in 6 months or sooner if needed

## 2023-09-20 NOTE — Unmapped (Signed)
 REASON FOR VISIT:  Crohn's ileitis    HISTORY OF PRESENT ILLNESS:  Since last visit,  07/26/2023, CBC, ALT normal  09/11/2023, CBC, CMP, ESR, CRP, iron profile, ferritin, B12, 25 vitamin D all normal except alk phos 144, WBC 11.3 and hemoglobin 12.3.    Started Entocort 9mg  on 09/06/23. Last skyrizi  dose on 09/19/23. Tolerating well.  Has fatigue, mild nausea (improving), mild anorexia (improving), difficulty gaining weight, but weight is stable. 1-2 soft-formed nonbloody bm per day. Rare nocturnal bm.  Takes zofran .    MEDICATIONS:  has a current medication list which includes the following prescription(s): acetaminophen , alprazolam, budesonide , buprenorphine -naloxone , divalproex  er, empty container, gabapentin , lamotrigine , loperamide , naloxone , nicotine , ondansetron , skyrizi , and risankizumab -rzaa.    ALLERGIES:  Allergies as of 09/23/2023 - Reviewed 08/29/2023   Allergen Reaction Noted    Acetaminophen  Nausea And Vomiting 04/27/2012    Benadryl [diphenhydramine hcl] Anaphylaxis 04/27/2012    Sulfa (sulfonamide antibiotics) Hives 04/27/2012    Rozerem [ramelteon] Other (See Comments) 05/11/2014    Shellfish containing products Diarrhea and Nausea And Vomiting 06/04/2011       PAST MEDICAL HISTORY:  1. Crohn's disease diagnosed as ileocolitis in 2007, treated initially with Pentasa and prednisone . Infliximab with partial response. Imuran with no help. Infusion reaction during infliximab retreatment. Adalimumab  provided no benefit. Certolizumab with mild improvement but plateaued despite once weekly  dosing. In 12/2007, CT with ileitis, internal fistula and abscess. 02/04/08, ielocolic resection (40cm ti) by Dr. Kattie in [place]. 07/2008, diarrhea. 05/03/09, thickening of the neoterminal ileum on CT scan. 6-MP started 50 mg per day. 05/03/09, start adalimumab . 06/01/09, 6-MP increased to 75 mg. 07/19/09, desipramine to 100 mg per day without benefit. 11/10/2009, 6 inches of ileal resection (stricture) with primary anastomosis. 12/20/2009, reluctant to restart immunosuppression. 03/21/10, stopped desipramine due to lack of benefit and start Cymbalta. 05/25/10 Colonoscopy with i3 recurrence. Restart 6-MP 75mg , but declined adalimumab  due to toe infections. 03/01/11, 6 inches of small bowel resected just proximal to the anastomosis. 05/22/11 start oral methotrexate. 06/04/11, CT abdomen and pelvis showed small-bowel dilation to 3.4 cm, prominent thickening of the ileocolonic anastomosis. Start ustekinumab. 10/26/11, colonoscopy showed minimal improvement. MTX switched to 25mg  [place].  02/20/13, colonoscopy showed I2 disease (possibly mildly improved).  Patient self stopped methotrexate due to perceived increase infectious complications.  Continued ustekinumab monotherapy.  2017, stopped ustekinumab due to incarceration.  2018, worsening symptoms, start adalimumab  in prison with improvement.  08/2019, stopped adalimumab  upon release from prison.  Worsening symptoms. 09/08/2020, start 6mp 75mg  daily. 12/06/2020, start adalimumab . 07/27/21, strep intermedius empyema of the left lung. and adalimumab  stopped. 11/21/2021, resume 6mp 75mg . 12/06/2021, resume adalimumab . 07/29/2022, CTE with 20 cm active ileitis and proximal dilatation. 08/08/2022, change adalimumab /6-MP to upadacitinib  45 mg. 11/01/2022, persistent disease on CT,  change upadacitinib  to risankizumab . 02/14/2023, ileocolonic resection (30 cm TI).  Path showed stricturing with deep ulcerations.  Continue risankizumab  postop.  09/06/2023, diarrhea.  Start Entocort 9 mg.  2. 06/2014, pilonidal cyst excision.  05/01/2021, pilonidal cyst excision.  12/06/2021, resume adalimumab .  3. Bipolar disorder  4. Substance use disorder  5. 09/08/20, PCV-20  6.  09/2020, QuantiFERON gold positive. 10/28/2020, [facility] health department determined patient did not need treatment for LTBI based on 2 repeat negative tests.  7.  02/09/2021, Shingrix  No. 1, #2 on 12/28/21    SOCIAL HISTORY:  Continues to smoke cigarettes (1 pack per week)    FAMILY HISTORY:  No colon cancer    PHYSICAL EXAM:  There were  no vitals taken for this visit.  Wt Readings from Last 4 Encounters:   03/06/23 67.2 kg (148 lb 1.6 oz)   02/14/23 68.4 kg (150 lb 12.7 oz)   02/11/23 68.2 kg (150 lb 5.7 oz)   02/05/23 72.6 kg (160 lb)   09/23/23, 138 lbs.  CONSTITUTIONAL: awake, alert, NAD    TEST DATA:  1. 05/03/09, CT scan with thickening of neoterminal ileum and subcm mesenteric lymphadenopathy. No stranding, abscess, or fistula.   2.08/26/2009, EGD was normal.   3.08/26/2009, colonoscopy to the neoterminal ileum. Poor bowel prep. Edema, erythema and shallow ulcerations at ICA and neoterminal ileum. Benign-appearing mild stenosis just proximal to ICA that was not traversed.  4. 10/28/2009, small bowel follow through showed 4-hour transit time, adherent small bowel loops in the pelvis that were difficult to separate, 1.5-2 cm segment of stricture in the neoterminal ileum that was 4 mm in diameter, more proximal 2.25 cm of neoterminal ileum was mildly narrowed.   5. 05/20/10, colonoscopy to neoterminal ileum with poor prep, i3 recurrence.   6. 02/07/2011, colonoscopy to 5 cm in the terminal ileum showed a linear ulcer at the ileocolonic anastomosis and severe ulceration in the neoterminal ileum that was worse than before. Terminal ileum could not be intubated more deeply due to edema and/or stricturing.   7. 06/04/11, CT abdomen and pelvis showed small-bowel dilatation to 3.4 cm, gas and stool in colon, prominent thickening at the ileocolonic anastomosis  8. 10/26/11 Colonoscopy- ICA with linear ulceration confined to the anastamotic line. >5 medium sized aphthous ulcers with normal intervening mucosa in the neoti. Slightly improved compared to prior pre-op colonoscopy.   9. 02/20/13, colonoscopy. Normal ICA. i2 disease in the distal 6 cm of TI.  Slightly improved.  10. 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  09/12/2020, CTAP.  Gallbladder wall thickening.  Marked thickening of the terminal ileum x20 cm.  2 cm mesenteric lymphadenopathy  12.  09/12/2020, right upper quadrant ultrasound.  Gallbladder sludge, wall thickening.  13.  09/14/2020, HIDA scan.  Normal.  14. 07/18/2021, colonoscopy to TI x1 cm.  Fair prep.  Nondraining sinus tract versus fistula on perianal exam.  Edema, small superficial ulcers and mild stenosis (1 cm) at ileocolonic anastomosis.  Dilated to 13.5 mm.  Anastomosis was traversed, but deeper intubation of the ileum was prevented by looping.   15. 07/05/2022, adalimumab  13.3, CRP 4mg /L  16. 07/29/2022, CTE.  20 cm segment of wall thickening, hyperenhancement.  Marked luminal narrowing of neo-TI at anastomosis with mild upstream bowel dilatation measuring 3.2 cm.  Questionable wall thickening and narrowing in the sigmoid colon.  1.1 cm MLN.  17. 09/06/2022, Fecal calprotectin 276  18. 10/22/2022, CTE.  4.5 cm enhancement and mild narrowing of neo-TI with proximal dilatation up to 3.1 cm.  Short segment 2.9 cm thickening and enhancement of the rectosigmoid.  19. 02/05/2023, colonoscopy to ICA.  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 Ileal Crohn's disease.  Stricturing phenotype.  Seems to be in symptomatic remission.  Recommend continuing risankizumab  every 2 months to maintain remission.  Taper budesonide .  Restage disease activity check for early endoscopic recurrence.  If significant recurrent Crohn's disease, then consider shortening interval between risankizumab  injections.  Reviewed risks of risankizumab  including risk of infection.  2.  Tobacco use disorder.  Smoking cessation counseling.    3.  Medically immune suppressed.  In need of vaccination.    RECOMMENDATIONS AND  PLAN:  Patient Instructions   -Continue Skyrizi  every 2 months as before  -Reduce Entocort to 6mg  daily.  -Continue ondansetron  as needed for nausea.  -Continue efforts to stop smoking  -Colonoscopy to restage Crohn's disease in the next 2 months. Please call [phone], option 1, then option 2 to schedule.  -Consider PCV-21 pneumonia vaccine (once), seasonal flu shot (annually) each October, and tetanus booster (once every 10 years) vaccines. You can get these at your local pharmacy. I will send in rx for pneumonia vaccine to your local pharmacy. You just need to call to schedule appointment.  -Telehealth visit with me in 6 months or sooner if needed      The patient reports they are physically located in [place]  and is currently: not at home. I conducted a audio/video visit. I spent 25 minutes on the video call with the patient. I spent an additional 10 minutes on pre- and post-visit activities on the date of service .

## 2023-09-23 ENCOUNTER — Encounter
Admit: 2023-09-23 | Discharge: 2023-09-24 | Payer: Medicare (Managed Care) | Attending: Gastroenterology | Primary: Gastroenterology

## 2023-09-23 DIAGNOSIS — K50819 Crohn's disease of both small and large intestine with unspecified complications: Principal | ICD-10-CM

## 2023-09-23 DIAGNOSIS — K50012 Crohn's disease of small intestine with intestinal obstruction: Principal | ICD-10-CM

## 2023-09-23 MED ORDER — NICOTINE 14 MG/24 HR DAILY TRANSDERMAL PATCH
MEDICATED_PATCH | TRANSDERMAL | 1 refills | 28.00000 days | Status: CP
Start: 2023-09-23 — End: ?
  Filled 2023-09-26: qty 28, 28d supply, fill #0

## 2023-09-23 MED ORDER — PNEUMOCOCCAL 21-VALENT CONJ VACCINE-DIP CRM (PF) 0.5 ML IM SYRINGE
Freq: Once | INTRAMUSCULAR | 0 refills | 1.00000 days | Status: CP
Start: 2023-09-23 — End: 2023-09-23

## 2023-09-23 MED ORDER — BUDESONIDE DR - ER 3 MG CAPSULE,DELAYED,EXTENDED RELEASE
ORAL_CAPSULE | Freq: Every morning | ORAL | 1 refills | 30.00000 days | Status: CP
Start: 2023-09-23 — End: ?

## 2023-09-27 NOTE — Unmapped (Signed)
 Colonoscopy  Procedure #1     Procedure #2   [id number]  MRN   GENERIC  Endoscopist   TRUE  Is the patient's health insurance [employer], [employer], [facility]), UHC [place], [employer], or Cigna?     Urgent procedure     Are you pregnant? (Ignore if [facility] provider has OK'd procedure in order comments despite pregnancy)     Are you in the process of scheduling or awaiting results of a heart ultrasound, stress test, or catheterization to evaluate new or worsening chest pain, dizziness, or shortness of breath?     Do you have achalasia or gastroparesis or take Mounjaro, Zepbound, [place], Trulicity, Ozempic, Victoza, Saxenda, Byetta, Bydureon, Rybelsus, or Adlyxin?      Do you take: Plavix (clopidogrel), Coumadin (warfarin), Lovenox  (enoxaparin ), Pradaxa (dabigatran), Effient (prasugrel), Xarelto (rivaroxaban), Eliquis (apixaban), Pletal (cilostazol), or Brilinta (ticagrelor)?          Did ordering provider indicate how long to hold this medication in the order comments?          Which of the above medications are you taking?          What is the name of the medical practice that manages this medication?          What is the name of the medical provider who manages this medication?     Do you have hemophilia, von Willebrand disease, or low platelets?     Do you have a pacemaker or implanted cardiac defibrillator?     Has a [place] GI provider specified the location(s)?     Which location(s) did the [facility] provider specify?          [facility]          [place]          [facility]      Do you see a liver specialist for chronic liver disease?     Is the procedure indication for variceal screening?     Is procedure indication for variceal banding (this does NOT include variceal screening)?     Have you had a heart attack, stroke or heart stent placement within the past 6 months?     Month of event     Year of event (ONLY ENTER LAST 2 DIGITS)        5  Height (feet)   11  Height (inches)   135  Weight (pounds)   18.8  BMI        TRUE  Did the ordering provider specify a bowel prep?   EXTENDED PREP        What bowel prep was specified?     Do you have an ostomy (bag on your stomach that collects your stool)?          Is it an ileostomy?          Is it a colostomy?          Patient doesn't know.     Do you have chronic kidney disease?     Do you have chronic constipation or have you had poor quality bowel preps for past colonoscopies?     Do you have Crohn's disease or ulcerative colitis?     Have you had weight loss surgery?          When you walk around your house or grocery store, do you have to stop and rest due to shortness of breath, chest pain, or light-headedness?  Do you ever use supplemental oxygen?     Have you been hospitalized for cirrhosis of the liver or heart failure in the last 12 months?     Have you been treated for mouth or throat cancer with radiation or surgery?     Have you been told that it is difficult for doctors to insert a breathing tube in you during anesthesia?     Have you had a heart or lung transplant?          Are you on dialysis?     Have you started dialysis in the last 6 months?     Do you have cirrhosis of the liver?     Do you have myasthenia gravis?     Is the patient a prisoner?   ################# ## ###################################################################################################################   MRN:  [id number]   Anticoag Review  No   Nurse Triage  No   Procedure(s):  Colonoscopy     0   Endoscopist:  GENERIC   Urgent:  No   Prep:  EXTENDED PREP                                  --------------------------- --- ----------------------------------------------------------------------------------------------------------------------------------------------------------------------------   G3 Locations:  [facility]   [facility]  [facility]      [place]        Requested Locations:              ################# ## ###################################################################################################################

## 2023-09-30 DIAGNOSIS — Z681 Body mass index (BMI) 19 or less, adult: Secondary | ICD-10-CM | POA: Diagnosis not present

## 2023-09-30 DIAGNOSIS — R5383 Other fatigue: Secondary | ICD-10-CM | POA: Diagnosis not present

## 2023-09-30 DIAGNOSIS — Z1159 Encounter for screening for other viral diseases: Secondary | ICD-10-CM | POA: Diagnosis not present

## 2023-09-30 DIAGNOSIS — R0602 Shortness of breath: Secondary | ICD-10-CM | POA: Diagnosis not present

## 2023-09-30 DIAGNOSIS — D539 Nutritional anemia, unspecified: Secondary | ICD-10-CM | POA: Diagnosis not present

## 2023-09-30 DIAGNOSIS — Z131 Encounter for screening for diabetes mellitus: Secondary | ICD-10-CM | POA: Diagnosis not present

## 2023-09-30 DIAGNOSIS — Z0000 Encounter for general adult medical examination without abnormal findings: Secondary | ICD-10-CM | POA: Diagnosis not present

## 2023-10-01 MED ORDER — PEG 3350-ELECTROLYTES 236 GRAM-22.74 GRAM-6.74 GRAM-5.86 GRAM SOLUTION
Freq: Once | ORAL | 0 refills | 1.00000 days | Status: CP
Start: 2023-10-01 — End: 2023-10-01

## 2023-10-01 MED ORDER — POLYETHYLENE GLYCOL 3350 17 GRAM/DOSE ORAL POWDER
ORAL | 0 refills | 0.00000 days | Status: CP
Start: 2023-10-01 — End: ?

## 2023-10-01 MED ORDER — BISACODYL 5 MG TABLET,DELAYED RELEASE
ORAL_TABLET | ORAL | 0 refills | 0.00000 days | Status: CP
Start: 2023-10-01 — End: ?

## 2023-10-09 DIAGNOSIS — F17210 Nicotine dependence, cigarettes, uncomplicated: Secondary | ICD-10-CM | POA: Diagnosis not present

## 2023-10-09 DIAGNOSIS — Z5320 Procedure and treatment not carried out because of patient's decision for unspecified reasons: Secondary | ICD-10-CM | POA: Diagnosis not present

## 2023-10-09 DIAGNOSIS — D649 Anemia, unspecified: Secondary | ICD-10-CM | POA: Diagnosis not present

## 2023-10-09 DIAGNOSIS — Z23 Encounter for immunization: Secondary | ICD-10-CM | POA: Diagnosis not present

## 2023-10-23 ENCOUNTER — Encounter
Admit: 2023-10-23 | Discharge: 2023-10-23 | Payer: Medicare (Managed Care) | Attending: Anesthesiology | Primary: Anesthesiology

## 2023-10-23 ENCOUNTER — Inpatient Hospital Stay: Admit: 2023-10-23 | Discharge: 2023-10-23 | Payer: Medicare (Managed Care)

## 2023-10-23 DIAGNOSIS — K50012 Crohn's disease of small intestine with intestinal obstruction: Principal | ICD-10-CM

## 2023-10-23 DIAGNOSIS — K5080 Crohn's disease of both small and large intestine without complications: Secondary | ICD-10-CM | POA: Diagnosis not present

## 2023-10-23 DIAGNOSIS — Z980 Intestinal bypass and anastomosis status: Secondary | ICD-10-CM | POA: Diagnosis not present

## 2023-10-23 MED ORDER — BUDESONIDE DR - ER 3 MG CAPSULE,DELAYED,EXTENDED RELEASE
ORAL_CAPSULE | Freq: Every morning | ORAL | 1 refills | 90.00000 days | Status: CP
Start: 2023-10-23 — End: 2024-04-20

## 2023-10-23 MED ADMIN — Propofol (DIPRIVAN) injection: INTRAVENOUS | @ 18:00:00 | Stop: 2023-10-23

## 2023-10-23 MED ADMIN — phenylephrine 1 mg/10 mL (100 mcg/mL) injection Syrg: INTRAVENOUS | @ 19:00:00 | Stop: 2023-10-23

## 2023-10-23 MED ADMIN — sodium chloride (NS) 0.9 % infusion: 10 mL/h | INTRAVENOUS | @ 18:00:00 | Stop: 2023-10-23

## 2023-10-23 MED ADMIN — ePHEDrine (PF) 25 mg/5 mL (5 mg/mL) in 0.9% sodium chloride syringe: INTRAVENOUS | @ 19:00:00 | Stop: 2023-10-23

## 2023-10-24 MED ORDER — BUDESONIDE DR - ER 3 MG CAPSULE,DELAYED,EXTENDED RELEASE
ORAL_CAPSULE | Freq: Every morning | ORAL | 1 refills | 90.00000 days | Status: CP
Start: 2023-10-24 — End: 2024-04-21

## 2023-10-24 NOTE — Unmapped (Signed)
 THERAPY PLAN ORDER ENTRY DOCUMENTATION    The following order(s) have been placed for insurance authorization:    Therapy Plan Order  Medication: Ustekinumab  Dose & Frequency: 390mg  IV once, 90mg  subcutaneous every 8 weeks starting at week 8.  Diagnosis: Crohn's Disease  Treatment Center: [facility]    Lab Results   Component Value Date    QFTTBGOLD Positive (A) 09/08/2020    HBSAG Nonreactive 09/08/2020             Requesting Physician: Dr. Dorn Lauth      Notes:    Switch from Skyrizi  to [place].

## 2023-10-24 NOTE — Unmapped (Signed)
 Patient is requesting the following refill  Requested Prescriptions     Pending Prescriptions Disp Refills    budesonide  (ENTOCORT EC ) 3 mg 24 hr capsule 270 capsule 1     Sig: Take 3 capsules (9 mg total) by mouth every morning.       Recent Visits  Date Type Provider Dept   09/23/23 Telemedicine Conny, Dorn Agent, MD [facility] Medicine [facility]   03/25/23 Telemedicine Conny, Dorn Agent, MD [facility] Medicine [facility]   01/24/23 Office Visit Conny, Dorn Agent, MD [facility] Medicine [facility]   01/24/23 Office Visit Jenita Dewayne SQUIBB, CPP [facility] Medicine [place]   12/31/22 Office Visit Conny, Dorn Agent, MD [facility] Medicine [facility]   Showing recent visits within past 365 days and meeting all other requirements  Future Appointments  Date Type Provider Dept   01/30/24 Appointment Conny, Dorn Agent, MD [facility] Medicine [facility]   Showing future appointments within next 365 days and meeting all other requirements           Encounter for refill request:    Colonoscopy:   Appointments which have been scheduled for you      Jan 30, 2024 11:00 AM  (Arrive by 10:45 AM)  RETURN VIDEO VISIT MYCHART with Dorn Agent Conny, MD  [facility] MEDIC[facility] [place]  Arrive at: This is a Video Visit [address]  [facility] [address]  [place] [id number]  [phone]   A direct link will be sent to you by your provider at the time of your video appointment. Please do NOT go to the clinic.     For your video visit, you will need a computer with a working camera, speaker and microphone, a smartphone, or a tablet with internet access.              Lab Results   Component Value Date    WBC 11.3 (H) 09/11/2023    RBC 4.21 09/11/2023    HGB 12.3 (L) 09/11/2023    HCT 39.0 09/11/2023    PLT 339 09/11/2023    ALT 17 09/11/2023    AST 19 09/11/2023    ALKPHOS 144 (H) 09/11/2023    CRP 2 09/11/2023 CREATININE 0.91 09/11/2023       refills routed to patient preferred pharmacy

## 2023-10-24 NOTE — Unmapped (Signed)
 [facility] Refill Coordination Note    Specialty Medication(s) to be Shipped:   Inflammatory Disorders: Skyrizi     Other medication(s) to be shipped: No additional medications requested for fill at this time    Specialty Medications not needed at this time: N/A     Dakota Townsend, DOB: Jun 04, 1980  Phone: [phone] (home)       All above HIPAA information was verified with patient.     Was a Nurse, learning disability used for this call? No    Completed refill call assessment today to schedule patient's medication shipment from the [facility]  [phone]).  All relevant notes have been reviewed.     Specialty medication(s) and dose(s) confirmed: Regimen is correct and unchanged.   Changes to medications: Dakota Townsend reports no changes at this time.  Changes to insurance: No  New side effects reported not previously addressed with a pharmacist or physician: None reported  Questions for the pharmacist: No    Confirmed patient received a Conservation officer, historic buildings and a Surveyor, mining with first shipment. The patient will receive a drug information handout for each medication shipped and additional FDA Medication Guides as required.       DISEASE/MEDICATION-SPECIFIC INFORMATION        For patients on injectable medications: Next injection is scheduled for 11/6.    SPECIALTY MEDICATION ADHERENCE     Medication Adherence    Patient reported X missed doses in the last month: 0  Specialty Medication: SKYRIZI  360 mg/2.4 mL  Patient is on additional specialty medications: No              Were doses missed due to medication being on hold? No    Skyrizi  360/2.4 mg/ml: 0 doses of medicine on hand        REFERRAL TO PHARMACIST     Referral to the pharmacist: Not needed      [facility]     Shipping address confirmed in Epic.     Cost and Payment: Patient has a $0 copay, payment information is not required.    Delivery Scheduled: Yes, Expected medication delivery date: 10/30/23. Medication will be delivered via UPS to the prescription address in Epic WAM.    Dakota Townsend   [facility]  Specialty Technician

## 2023-10-29 MED FILL — SKYRIZI 360 MG/2.4 ML (150 MG/ML) SUBCUTANEOUS WEARABLE INJECTOR: SUBCUTANEOUS | 56 days supply | Qty: 2.4 | Fill #0

## 2023-11-06 ENCOUNTER — Telehealth: Payer: Self-pay

## 2023-11-06 NOTE — Telephone Encounter (Signed)
 Patient is overdue for an appointment. LMOM for patient to call and schedule. Mychart message sent as well.

## 2023-11-07 NOTE — Telephone Encounter (Signed)
 Stelara induction infusion scheduled 11/27/23.

## 2023-11-25 ENCOUNTER — Telehealth: Payer: Self-pay

## 2023-11-25 NOTE — Telephone Encounter (Signed)
 Spoke with patient to verify if still a current patient of [facility], patient stated that he transferred his care to [facility].  Anjuli Gemmill N. Tomie, LPN [facility] Annual Wellness Team Direct Dial: [phone]

## 2023-11-27 DIAGNOSIS — K50012 Crohn's disease of small intestine with intestinal obstruction: Principal | ICD-10-CM

## 2023-11-27 MED ORDER — USTEKINUMAB 90 MG/ML SUBCUTANEOUS SYRINGE
SUBCUTANEOUS | 2 refills | 56.00000 days | Status: CP
Start: 2023-11-27 — End: ?

## 2023-11-27 NOTE — Telephone Encounter (Signed)
 MEDICATION ORDER ENTRY DOCUMENTATION    The following medication(s) have been sent to pharmacy for insurance authorization:    Medication: ustekinumab   Dose & Frequency: 90mg  subcutaneous every 8 weeks   Pharmacy: [facility]       Requesting Physician: Dr. Dorn Lauth    Notes:  Induction dose 11/27/23. 1st subcutaneous dose due 01/22/24

## 2023-11-28 DIAGNOSIS — K50012 Crohn's disease of small intestine with intestinal obstruction: Principal | ICD-10-CM

## 2023-11-28 MED ORDER — STEQEYMA 90 MG/ML SUBCUTANEOUS SYRINGE
SUBCUTANEOUS | 2 refills | 0.00000 days | Status: CP
Start: 2023-11-28 — End: ?
  Filled 2023-12-30: qty 1, 56d supply, fill #0

## 2023-11-28 NOTE — Telephone Encounter (Signed)
 Insurance prefers HOME DEPOT and CHARTERED LOSS ADJUSTER over Stelara. New script for [facility] sent to [facility].

## 2023-12-10 NOTE — Progress Notes (Signed)
 Labs documented 11/26/2023  Labs collected 12/10/2023

## 2023-12-20 NOTE — Progress Notes (Signed)
 [facility]    Patient Onboarding/Medication Counseling    Sent MyChart message with instructions and link to manufacture's video     Dakota Townsend is a 43 y.o. male with Crohn's who I am counseling today on initiation of therapy.  I am speaking to the patient.    Was a nurse, learning disability used for this call? No    Verified patient's date of birth / HIPAA.    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 .    Administration:     Prefilled syringe  Gather all supplies needed for injection on a clean, flat working surface: medication syringe(s) removed from packaging, alcohol swab, sharps container, etc.  Look at the medication label - look for correct medication, correct dose, and check the expiration date  Look at the medication - the liquid in the syringe should appear clear and colorless to slightly yellow, you may see a few white particles. Air bubbles are normal.  Lay the syringe on a flat surface and allow it to warm up to room temperature for at least 30 minutes  Select injection site - you can use the front of your thigh or your belly (but not the area 2 inches around your belly button); if someone else is giving you the injection you can also use your outer upper arm in the skin covering your triceps muscle or in the buttocks  Prepare injection site - wash your hands and clean the skin at the injection site with an alcohol swab and let it air dry, do not touch the injection site again before the injection  Pull off the needle safety cap, do not remove until immediately prior to injection  Pinch the skin - with your hand not holding the syringe pinch up a fold of skin at the injection site using your forefinger and thumb  Insert the needle into the fold of skin at about a 45 degree angle - it's best to use a quick dart-like motion  Push the plunger down slowly as far as it will go until the syringe is empty, if the plunger is not fully depressed the needle shield will not extend to cover the needle when it is removed, hold the syringe in place for a full 5 seconds  Check that the syringe is empty and keep pressing down on the plunger while you pull the needle out at the same angle as inserted; after the needle is removed completely from the skin, release the plunger allowing the needle shield to activate and cover the used needle  Dispose of the used syringe immediately in your sharps disposal container, do not attempt to recap the needle prior to disposing  If you see any blood at the injection site, press a cotton ball or gauze on the site and maintain pressure until the bleeding stops, do not rub the injection site  Adherence/Missed dose instructions:  If your injection is given more than 7-10 days after your scheduled injection date - consult your pharmacist for additional instructions on how to adjust your dosing schedule.    Goals of Therapy     Crohn's disease  Achieve remission of symptoms  Maintain remission of symptoms  Minimize long-term systemic glucocorticoid use  Prevent need for surgical procedures  Maintenance of effective psychosocial functioning    Side Effects & Monitoring Parameters     Injection site reaction (redness, irritation, inflammation localized to the site of administration)  Signs of a common cold - minor sore throat, runny or stuffy nose, etc.  Feeling tired or weak  Headache    The following side effects should be reported to the provider:  Signs of a hypersensitivity reaction - rash; hives; itching; red, swollen, blistered, or peeling skin; wheezing; tightness in the chest or throat; difficulty breathing, swallowing, or talking; swelling of the mouth, face, lips, tongue, or throat; etc.  Reduced immune function - report signs of infection such as fever; chills; body aches; very bad sore throat; ear or sinus pain; cough; more sputum or change in color of sputum; pain with passing urine; wound that will not heal, etc.  Also at a slightly higher risk of some malignancies (mainly skin and blood cancers) due to this reduced immune function.  In the case of signs of infection - the patient should hold the next dose of Steqeyma ?? and call your primary care provider to ensure adequate medical care.  Treatment may be resumed when infection is treated and patient is asymptomatic.  Changes in skin - a new growth or lump that forms; changes in shape, size, or color of a previous mole or marking  Shortness of breath or chest pain  Vaginal itching or discharge      Contraindications, Warnings, & Precautions     Have your bloodwork checked as you have been told by your prescriber  Talk with your doctor if you are pregnant, planning to become pregnant, or breastfeeding  Discuss the possible need for holding your dose(s) of Steqeyma ?? when a planned procedure is scheduled with the prescriber as it may delay healing/recovery timeline       Drug/Food Interactions     Medication list reviewed in Epic. The patient was instructed to inform the care team before taking any new medications or supplements. No drug interactions identified.   If you have a latex allergy use caution when handling, the needle cap of the Steqeyma ?? prefilled syringe contains a derivative of natural rubber latex  Talk with you prescriber or pharmacist before receiving any live vaccinations while taking this medication and after you stop taking it    Storage, Handling Precautions, & Disposal     Store this medication in the refrigerator (36?? to 46??F).  Do not freeze  If needed, individual vials and prefilled syringes may be stored at room temperature up to 86??F (30??C) for a maximum single period of up to 15 days in the original carton to protect from light. Once a syringe has been stored at room temperature, do not return it to the refrigerator  Store in original packaging, protected from light  Do not shake  Dispose of used syringes/pens in a sharps disposal container    Ref: STEQEYMA -Patient-Injection-Overview-and-Dosing-Calendar.pdf       Current Medications (including OTC/herbals), Comorbidities and Allergies     Current Medications[1]    Allergies[2]    Problem List[3]    Medication list has  been reviewed and updated in Epic: Yes    Allergies have been reviewed and updated in Epic: Yes    Appropriateness of Therapy     Acute infections noted within Epic:  No active infections  Patient reported infection: None    Is the medication and dose appropriate considering the patient???s diagnosis, treatment, and disease journey, comorbidities, medical history, current medications, allergies, therapeutic goals, self-administration ability, and access barriers? Yes    Prescription has been clinically reviewed: Yes      Baseline Quality of Life Assessment      How many days over the past month did your Crohn's  keep you from your normal activities? For example, brushing your teeth or getting up in the morning. Patient declined to answer    Financial Information     Medication Assistance provided: Prior Authorization    Anticipated copay of $0 reviewed with patient. Verified delivery address.    Delivery Information     Scheduled delivery date: 12/23    Expected start date: 1/14      Medication will be delivered via UPS to the prescription address in [facility].  This shipment will not require a signature.      Explained the services we provide at [facility] and that each month we would call to set up refills.  Stressed importance of returning phone calls so that we could ensure they receive their medications in time each month.  Informed patient that we should be setting up refills 7-10 days prior to when they will run out of medication.  A pharmacist will reach out to perform a clinical assessment periodically.  Informed patient that a welcome packet, containing information about our pharmacy and other support services, a Notice of Privacy Practices, and a drug information handout will be sent.      The patient or caregiver noted above participated in the development of this care plan and knows that they can request review of or adjustments to the care plan at any time.      Patient or caregiver verbalized understanding of the above information as well as how to contact the pharmacy at [phone] option 4 with any questions/concerns.  The pharmacy is open Monday through Friday 8:30am-4:30pm.  A pharmacist is available 24/7 via pager to answer any clinical questions they may have.    Patient Specific Needs     Does the patient have any physical, cognitive, or cultural barriers? No    Does the patient have adequate living arrangements? (i.e. the ability to store and take their medication appropriately) Yes    Did you identify any home environmental safety or security hazards? No    Patient prefers to have medications discussed with  Patient     Is the patient or caregiver able to read and understand education materials at a high school level or above? Yes    Patient's primary language is  English     Is the patient high risk? No    Does the patient have an additional or emergency contact listed in their chart? Yes    SOCIAL DETERMINANTS OF HEALTH     At the [facility], we have learned that life circumstances - like trouble affording food, housing, utilities, or transportation can affect the health of many of our patients.   That is why we wanted to ask: are you currently experiencing any life circumstances that are negatively impacting your health and/or quality of life? No  Social Drivers of Health     Food Insecurity: No Food Insecurity (02/15/2023)    Hunger Vital Sign     Worried About Running Out of Food in the Last Year: Never true     Ran Out of Food in the Last Year: Never true   Tobacco Use: High Risk (10/23/2023)    Patient History     Smoking Tobacco Use: Every Day     Smokeless Tobacco Use: Never     Passive Exposure: Not on file   Transportation Needs: No Transportation Needs (02/15/2023)    PRAPARE - Transportation     Lack of Transportation (Medical): No     Lack of Transportation (Non-Medical): No   Alcohol Use: Not on file   Housing: Low Risk (02/15/2023)    Housing     Within the past 12 months, have you ever stayed: outside, in a car, in a tent, in an overnight shelter, or temporarily in someone else's home (i.e. couch-surfing)?: No     Are you worried about losing your housing?: No   Physical Activity: Inactive (08/28/2021)    Received from [facility]    Exercise Vital Sign     On average, how many days per week do you engage in moderate to strenuous exercise (like a brisk walk)?: 0 days     On average, how many minutes do you engage in exercise at this level?: 0 min   Utilities: Low Risk (02/15/2023)    Utilities     Within the past 12 months, have you been unable to get utilities (heat, electricity) when it was really needed?: No   Stress: No Stress Concern Present (08/28/2021)    Received from [facility] - Occupational Stress Questionnaire     Feeling of Stress : Not at all   Interpersonal Safety: Unknown (10/23/2023)    Interpersonal Safety     Unsafe Where You Currently Live: No     Physically Hurt by Anyone: Not on file     Abused by Anyone: Not on file   Substance Use: Not on file (11/18/2022)   Intimate Partner Violence: Not At Risk (08/28/2021)    Received from [facility]    Humiliation, Afraid, Rape, and Kick questionnaire     Within the last year, have you been afraid of your partner or ex-partner?: No     Within the last year, have you been humiliated or emotionally abused in other ways by your partner or ex-partner?: No     Within the last year, have you been kicked, hit, slapped, or otherwise physically hurt by your partner or ex-partner?: No     Within the last year, have you been raped or forced to have any kind of sexual activity by your partner or ex-partner?: No   Social Connections: Moderately Integrated (08/28/2021)    Received from [facility]    Social Connection and Isolation Panel     In a typical week, how many times do you talk on the phone with family, friends, or neighbors?: More than three times a week     How often do you get together with friends or relatives?: More than three times a week     How often do you attend church or religious services?: More than 4 times per year     Do you belong to any clubs or organizations such as church groups, unions, fraternal or athletic groups, or school groups?: No  How often do you attend meetings of the clubs or organizations you belong to?: More than 4 times per year     Are you married, widowed, divorced, separated, never married, or living with a partner?: Never married   Physicist, Medical Strain: Low Risk (02/15/2023)    Overall Financial Resource Strain (CARDIA)     Difficulty of Paying Living Expenses: Not very hard   Health Literacy: Not on file   Internet Connectivity: Not on file       Would you be willing to receive help with any of the needs that you have identified today? No       Harlene DELENA Elder, PharmD  [facility] Specialty Pharmacist       [1]   Current Outpatient Medications   Medication Sig Dispense Refill    acetaminophen  (TYLENOL ) 500 MG tablet Take 2 tablets (1,000 mg total) by mouth every six (6) hours. 30 tablet 0    ALPRAZolam (XANAX) 0.5 MG tablet Take 1 tablet (0.5 mg total) by mouth two (2) times a day.      bisacodyl  (DULCOLAX) 5 mg EC tablet Take 2 tablets (10 mg total) by mouth Take as directed. Take 2 tablets (10 mg total) as directed for bowel prep. 2 tablet 0    budesonide  (ENTOCORT EC ) 3 mg 24 hr capsule Take 3 capsules (9 mg total) by mouth every morning. 270 capsule 1    buprenorphine -naloxone  (SUBOXONE ) 8-2 mg sublingual film       divalproex  ER (DEPAKOTE  ER) 500 MG extended released 24 hr tablet       empty container Misc Use as directed 1 each 3    gabapentin  (NEURONTIN ) 300 MG capsule Take 1 capsule (300 mg total) by mouth Three (3) times a day for 7 days. 21 capsule 0    lamoTRIgine  (LAMICTAL ) 200 MG tablet Take 1 tablet (200 mg total) by mouth.      loperamide  (IMODIUM  A-D) 2 mg tablet 2-4mg  by mouth qid prn diarrhea 120 tablet 3    naloxone  (NARCAN ) 4 mg nasal spray One spray in either nostril once for known/suspected opioid overdose. May repeat every 2-3 minutes in alternating nostril til EMS arrives 2 each 0    nicotine  (NICODERM CQ ) 14 mg/24 hr patch Place 1 patch on the skin daily. 28 patch 1    ondansetron  (ZOFRAN -ODT) 4 MG disintegrating tablet Dissolve 1 tablet (4 mg total) in the mouth every eight (8) hours as needed for nausea. 12 tablet 5    polyethylene glycol (CLEARLAX) 17 gram/dose powder Take as directed for extended bowel prep. 238 g 0    risankizumab -rzaa 360 mg/2.4 mL (150 mg/mL) Injt Inject the contents of 1 cartridge (360mg ) under the skin every 8 weeks 2.4 mL 3    ustekinumab -stba (STEQEYMA ) 90 mg/mL Syrg Inject the contents of 1 syringe (90 mg) under the skin every 8 weeks. 1 mL 2     No current facility-administered medications for this visit.   [2]   Allergies  Allergen Reactions    Acetaminophen  Nausea And Vomiting    Benadryl [Diphenhydramine Hcl] Anaphylaxis    Sulfa (Sulfonamide Antibiotics) Hives    Rozerem [Ramelteon] Other (See Comments)     Pt reports hallucinations, muscle contortions  & dry eyes    Shellfish Containing Products Diarrhea and Nausea And Vomiting     Can have shrimp but not scallops   [3]   Patient Active Problem List  Diagnosis    Crohn's  disease of small intestine with intestinal obstruction (CMS-HCC)    Tobacco use disorder    Intestinal obstruction (CMS-HCC)    Intestinal bypass or anastomosis status    Nausea with vomiting    Esophageal reflux    Major depression    Marijuana use    Chronic abdominal pain    Pilonidal cyst    Acute respiratory failure with hypoxia    (CMS-HCC)    Cocaine abuse (CMS-HCC)    Empyema of right pleural space    (CMS-HCC)    Sepsis    (CMS-HCC)    Former smoker    Insomnia    Metabolic acidosis    Normocytic anemia    Opioid dependence (CMS-HCC)    Pain, chronic post-thoracotomy    Reactive airway disease (HHS-HCC)    Renal failure    Severe protein-calorie malnutrition (HHS-HCC)

## 2023-12-20 NOTE — Progress Notes (Signed)
 [facility] Specialty Medication Onboarding    Specialty Medication: STEQEYMA  90 mg/mL Syrg (ustekinumab -stba)  Prior Authorization: Approved   Financial Assistance: No - copay  <$25  Copay/Day Supply: $0 / 56    Insurance Restrictions: None     Notes to Pharmacist: First subcutaneous dose due 01/22/24  Credit Card on File: not applicable  Start Date on Rx:  11/28/23  Delivery Method (based on home address currently on file): UPS no restrictions      The triage team has completed the benefits investigation and has determined that the patient is able to fill this medication at [facility]. Please contact the patient to complete the onboarding or follow up with the prescribing physician as needed.

## 2023-12-20 NOTE — Progress Notes (Signed)
 Steqeyma  Q8W approved for $0 copay.  Patient outreach scheduled for 3 weeks before first subcutaneous dose on 01/22/2024.  Sent patient Mychart message with link to video and communicated outreach.   IV infusion: 11/27/2023  1st subcutaneous dose due: 01/22/2024    Harlene Elder PharmD  [facility]  [phone] opt [phone]

## 2023-12-25 MED ORDER — BUDESONIDE DR - ER 3 MG CAPSULE,DELAYED,EXTENDED RELEASE
ORAL_CAPSULE | Freq: Every day | ORAL | 1 refills | 30.00000 days | Status: CP
Start: 2023-12-25 — End: ?

## 2023-12-25 NOTE — Telephone Encounter (Signed)
 Patient is requesting the following refill  Requested Prescriptions     Pending Prescriptions Disp Refills    budesonide  (ENTOCORT EC ) 3 mg 24 hr capsule [Pharmacy Med Name: BUDESONIDE  EC 3 MG CAPSULE] 60 capsule 1     Sig: TAKE 2 CAPSULES BY MOUTH EVERY MORNING.       Recent Visits  Date Type Provider Dept   09/23/23 Telemedicine Conny, Dorn Agent, MD [facility]   03/25/23 Telemedicine Conny, Dorn Agent, MD [facility] Medicine [facility]   01/24/23 Office Visit Conny, Dorn Agent, MD [facility] Medicine [facility]   01/24/23 Office Visit Jenita Dewayne SQUIBB, CPP [facility] [place]   12/31/22 Office Visit Conny, Dorn Agent, MD [facility] Medicine [facility]   Showing recent visits within past 365 days and meeting all other requirements  Future Appointments  Date Type Provider Dept   01/30/24 Appointment Conny, Dorn Agent, MD [facility] Medicine [facility]   Showing future appointments within next 365 days and meeting all other requirements           Encounter for refill request:    Colonoscopy:   Appointments which have been scheduled for you      Jan 30, 2024 11:00 AM  (Arrive by 10:45 AM)  RETURN VIDEO VISIT DIRECT LINK with Dorn Agent Conny, MD  [facility] [place]  ([place])  Arrive at: This is a Video Visit [address]  [facility] [address]  [place] [address]  [phone]   A direct link will be sent to you by your provider at the time of your video appointment. Please do NOT go to the clinic.     For your video visit, you will need a computer with a working camera, speaker and microphone, a smartphone, or a tablet with internet access.              Lab Results   Component Value Date    WBC 11.3 (H) 09/11/2023    RBC 4.21 09/11/2023    HGB 12.3 (L) 09/11/2023    HCT 39.0 09/11/2023    PLT 339 09/11/2023    ALT 17 09/11/2023    AST 19 09/11/2023    ALKPHOS 144 (H) 09/11/2023    CRP 2 09/11/2023    CREATININE 0.91 09/11/2023       refills authorized x

## 2024-01-15 MED ORDER — LOPERAMIDE 2 MG CAPSULE
ORAL_CAPSULE | 5 refills | 0.00000 days
Start: 2024-01-15 — End: ?

## 2024-01-16 MED ORDER — LOPERAMIDE 2 MG CAPSULE
ORAL_CAPSULE | ORAL | 5 refills | 0.00000 days | Status: CP
Start: 2024-01-16 — End: ?

## 2024-01-16 NOTE — Telephone Encounter (Signed)
 Patient is requesting the following refill  Requested Prescriptions     Pending Prescriptions Disp Refills    loperamide  (IMODIUM ) 2 mg capsule [Pharmacy Med Name: LOPERAMIDE  2 MG CAPSULE] 120 capsule 5     Sig: TAKE 1-2 CAPSULES BY MOUTH EVERY 4 HOURS AS NEEDED FOR DIARRHEA       Recent Visits  Date Type Provider Dept   09/23/23 Telemedicine Conny, Dorn Agent, MD [facility]   03/25/23 Telemedicine Conny, Dorn Agent, MD [facility]   01/24/23 Office Visit Conny, Dorn Agent, MD [facility]   01/24/23 Office Visit Jenita, Dewayne SQUIBB, CPP [facility]   Showing recent visits within past 365 days and meeting all other requirements  Future Appointments  Date Type Provider Dept   01/30/24 Appointment Conny, Dorn Agent, MD [facility]   Showing future appointments within next 365 days and meeting all other requirements           Encounter for refill request:    Colonoscopy:   Appointments which have been scheduled for you      Jan 30, 2024 11:00 AM  (Arrive by 10:45 AM)  RETURN VIDEO VISIT DIRECT LINK with Dorn Agent Conny, MD  [facility] [place] ([place])  Arrive at: This is a Video Visit [address]  [facility] [address]  [place] [address]  [phone]   A direct link will be sent to you by your provider at the time of your video appointment. Please do NOT go to the clinic.     For your video visit, you will need a computer with a working camera, speaker and microphone, a smartphone, or a tablet with internet access.              Lab Results   Component Value Date    WBC 11.3 (H) 09/11/2023    RBC 4.21 09/11/2023    HGB 12.3 (L) 09/11/2023    HCT 39.0 09/11/2023    PLT 339 09/11/2023    ALT 17 09/11/2023    AST 19 09/11/2023    ALKPHOS 144 (H) 09/11/2023    CRP 2 09/11/2023    CREATININE 0.91 09/11/2023       refills authorized x

## 2024-01-29 NOTE — Patient Instructions (Addendum)
-  Continue ustekinumab /Stelara  every 2 months as before  -Continue Entocort to 6mg  daily indefinitely for now.  -Continue ondansetron  as needed for nausea. Let me know if you need refills.  -Try dicyclomine  as needed for abdominal pains. [facility]  -Continue efforts to stop smoking  -Consider seasonal flu shot (annually) each October, and tetanus booster (once every 10 years) vaccines. You can get these at your local pharmacy.  At your request, I will have my office contact you about getting the tetanus booster at [facility].  -Check fecal calprotectin at [facility] in about 2 months. If elevated, may consider increasing Stelara  to once monthly.  -OK to use minoxidil or finasteride to treat hair loss if desired.  -Telehealth visit with me in 4 months or sooner if needed

## 2024-01-29 NOTE — Progress Notes (Signed)
 REASON FOR VISIT:  Crohn's ileitis    HISTORY OF PRESENT ILLNESS:  Since last visit,  10/23/2023, colonoscopy to neoTI x 5 cm.  Poor prep.  ICA with multiple 3-5 mm ulcers.  Many 3-7 mm (some moderately deep) ulcers in examined 5 cm of neo-TI (i3).  Increase budesonide  to 9 mg  11/26/23, fecal calpro 211  12/10/2023, change risankizumab  to ustekinumab     Currently on ustekinumab  and entocort 9mg . Having regular bms, 1-2 soft nonbloody bms/day on average.  Rare nocturnal bms. Uses loperamide  prn.  Continues to have abdominal pain and fatigue. Had intermittent nausea that started 1 week prior to last ustekinumab  dose--treats with zofran . No routine postprandial GI symptoms. Overall, no change in gi symptoms since starting ustekinumab .     MEDICATIONS:  has a current medication list which includes the following prescription(s): loperamide , acetaminophen , alprazolam, bisacodyl , budesonide , buprenorphine -naloxone , divalproex  er, empty container, gabapentin , lamotrigine , loperamide , naloxone , nicotine , ondansetron , polyethylene glycol, risankizumab -rzaa, and steqeyma .    ALLERGIES:  Allergies as of 01/30/2024 - Reviewed 12/20/2023   Allergen Reaction Noted    Acetaminophen  Nausea And Vomiting 04/27/2012    Benadryl [diphenhydramine hcl] Anaphylaxis 04/27/2012    Sulfa (sulfonamide antibiotics) Hives 04/27/2012    Rozerem [ramelteon] Other (See Comments) 05/11/2014    Shellfish containing products Diarrhea and Nausea And Vomiting 06/04/2011       PAST MEDICAL HISTORY:  1. Crohn's disease diagnosed as ileocolitis in 2007, treated initially with Pentasa and prednisone . Infliximab with partial response. Imuran with no help. Infusion reaction during infliximab retreatment. Adalimumab  provided no benefit. Certolizumab with mild improvement but plateaued despite once weekly  dosing. In 12/2007, CT with ileitis, internal fistula and abscess. 02/04/08, ielocolic resection (40cm ti) by Dr. Kattie in [place]. 07/2008, diarrhea. 05/03/09, thickening of the neoterminal ileum on CT scan. 6-MP started 50 mg per day. 05/03/09, start adalimumab . 06/01/09, 6-MP increased to 75 mg. 07/19/09, desipramine to 100 mg per day without benefit. 11/10/2009, 6 inches of ileal resection (stricture) with primary anastomosis. 12/20/2009, reluctant to restart immunosuppression. 03/21/10, stopped desipramine due to lack of benefit and start Cymbalta. 05/25/10 Colonoscopy with i3 recurrence. Restart 6-MP 75mg , but declined adalimumab  due to toe infections. 03/01/11, 6 inches of small bowel resected just proximal to the anastomosis. 05/22/11 start oral methotrexate. 06/04/11, CT abdomen and pelvis showed small-bowel dilation to 3.4 cm, prominent thickening of the ileocolonic anastomosis. Start ustekinumab . 10/26/11, colonoscopy showed minimal improvement. MTX switched to 25mg  [place].  02/20/13, colonoscopy showed I2 disease (possibly mildly improved).  Patient self stopped methotrexate due to perceived increase infectious complications.  Continued ustekinumab  monotherapy.  2017, stopped ustekinumab  due to incarceration.  2018, worsening symptoms, start adalimumab  in prison with improvement.  08/2019, stopped adalimumab  upon release from prison.  Worsening symptoms. 09/08/2020, start 6mp 75mg  daily. 12/06/2020, start adalimumab . 07/27/21, strep intermedius empyema of the left lung. and adalimumab  stopped. 11/21/2021, resume 6mp 75mg . 12/06/2021, resume adalimumab . 07/29/2022, CTE with 20 cm active ileitis and proximal dilatation. 08/08/2022, change adalimumab /6-MP to upadacitinib  45 mg. 11/01/2022, persistent disease on CT,  change upadacitinib  to risankizumab . 02/14/2023, ileocolonic resection (30 cm TI).  Path showed stricturing with deep ulcerations.  Continue risankizumab  postop.  09/06/2023, diarrhea.  Start Entocort 9 mg. 10/23/2023, colonoscopy with i3 disease. 12/10/2023, change risankizumab  to ustekinumab .  2. 06/2014, pilonidal cyst excision.  05/01/2021, pilonidal cyst excision.  12/06/2021, resume adalimumab .  3. Bipolar disorder  4. Substance use disorder  5. 09/08/20, PCV-20  6.  09/2020, QuantiFERON gold positive. 10/28/2020, [facility] health department determined  patient did not need treatment for LTBI based on 2 repeat negative tests.  7.  02/09/2021, Shingrix  No. 1, #2 on 12/28/21    SOCIAL HISTORY:  Continues to smoke cigarettes (1 pack per week)    FAMILY HISTORY:  No colon cancer    PHYSICAL EXAM:  There were no vitals taken for this visit.  Wt Readings from Last 4 Encounters:   10/23/23 61.2 kg (135 lb)   03/06/23 67.2 kg (148 lb 1.6 oz)   02/14/23 68.4 kg (150 lb 12.7 oz)   02/11/23 68.2 kg (150 lb 5.7 oz)   09/23/23, 138 lbs.  01/30/24, 140 lbs  CONSTITUTIONAL: awake, alert, NAD    TEST DATA:  1. 05/03/09, CT scan with thickening of neoterminal ileum and subcm mesenteric lymphadenopathy. No stranding, abscess, or fistula.   2.08/26/2009, EGD was normal.   3.08/26/2009, colonoscopy to the neoterminal ileum. Poor bowel prep. Edema, erythema and shallow ulcerations at ICA and neoterminal ileum. Benign-appearing mild stenosis just proximal to ICA that was not traversed.  4. 10/28/2009, small bowel follow through showed 4-hour transit time, adherent small bowel loops in the pelvis that were difficult to separate, 1.5-2 cm segment of stricture in the neoterminal ileum that was 4 mm in diameter, more proximal 2.25 cm of neoterminal ileum was mildly narrowed.   5. 05/20/10, colonoscopy to neoterminal ileum with poor prep, i3 recurrence.   6. 02/07/2011, colonoscopy to 5 cm in the terminal ileum showed a linear ulcer at the ileocolonic anastomosis and severe ulceration in the neoterminal ileum that was worse than before. Terminal ileum could not be intubated more deeply due to edema and/or stricturing.   7. 06/04/11, CT abdomen and pelvis showed small-bowel dilatation to 3.4 cm, gas and stool in colon, prominent thickening at the ileocolonic anastomosis  8. 10/26/11 Colonoscopy- ICA with linear ulceration confined to the anastamotic line. >5 medium sized aphthous ulcers with normal intervening mucosa in the neoti. Slightly improved compared to prior pre-op colonoscopy.   9. 02/20/13, colonoscopy. Normal ICA. i2 disease in the distal 6 cm of TI.  Slightly improved.  10. 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  09/12/2020, CTAP.  Gallbladder wall thickening.  Marked thickening of the terminal ileum x20 cm.  2 cm mesenteric lymphadenopathy  12.  09/12/2020, right upper quadrant ultrasound.  Gallbladder sludge, wall thickening.  13.  09/14/2020, HIDA scan.  Normal.  14. 07/18/2021, colonoscopy to TI x1 cm.  Fair prep.  Nondraining sinus tract versus fistula on perianal exam.  Edema, small superficial ulcers and mild stenosis (1 cm) at ileocolonic anastomosis.  Dilated to 13.5 mm.  Anastomosis was traversed, but deeper intubation of the ileum was prevented by looping.   15. 07/05/2022, adalimumab  13.3, CRP 4mg /L  16. 07/29/2022, CTE.  20 cm segment of wall thickening, hyperenhancement.  Marked luminal narrowing of neo-TI at anastomosis with mild upstream bowel dilatation measuring 3.2 cm.  Questionable wall thickening and narrowing in the sigmoid colon.  1.1 cm MLN.  17. 09/06/2022, Fecal calprotectin 276  18. 10/22/2022, CTE.  4.5 cm enhancement and mild narrowing of neo-TI with proximal dilatation up to 3.1 cm.  Short segment 2.9 cm thickening and enhancement of the rectosigmoid.  19. 02/05/2023, colonoscopy to ICA.  Severe ulcerated nontraversable stenosis at ICA  20. 10/23/2023, colonoscopy to neoTI x 5 cm.  Poor prep.  ICA with multiple 3-5 mm ulcers.  Many 3-7 mm (some moderately deep) ulcers in examined 5 cm of neo-TI (  i3).  Increase budesonide  to 9 mg  21. 11/26/23, fecal calpro 211    ASSESSMENT:  1. Ileal Crohn's disease.  Stricturing phenotype.  Recently had significant endoscopic recurrence despite risankizumab .  Therefore, switch to ustekinumab .  Too soon to tell whether this is going to be a good long-term option for him.  Continue this for now.  Recheck fecal calprotectin at [facility].  Will 2 months.  If elevated, then consider increasing ustekinumab  to monthly.  Reviewed risks including risk of infection.  Continue budesonide  6 mg daily for now.  2.  Chronic GI symptoms including nausea, abdominal pain.  Unclear whether this is due to Crohn's disease or functional GI disorder.  Try dicyclomine  in addition to ondansetron .  3.  Tobacco use disorder.  Smoking cessation counseling.    4.  Medically immune suppressed.  In need of vaccination.    RECOMMENDATIONS AND PLAN:  Patient Instructions   -Continue ustekinumab /Stelara  every 2 months as before  -Continue Entocort to 6mg  daily indefinitely for now.  -Continue ondansetron  as needed for nausea. Let me know if you need refills.  -Try dicyclomine  as needed for abdominal pains. [facility]  -Continue efforts to stop smoking  -Consider seasonal flu shot (annually) each October, and tetanus booster (once every 10 years) vaccines. You can get these at your local pharmacy.  At your request, I will have my office contact you about getting the tetanus booster at [facility].  -Check fecal calprotectin at [facility] in about 2 months. If elevated, may consider increasing Stelara  to once monthly.  -OK to use minoxidil or finasteride to treat hair loss if desired.  -Telehealth visit with me in 4 months or sooner if needed    The patient reports they are physically located in [place]  and is currently: not at home. I conducted a audio/video visit. I spent  15m 28s on the video call with the patient. I spent an additional 10 minutes on pre- and post-visit activities on the date of service .

## 2024-01-30 ENCOUNTER — Encounter
Admit: 2024-01-30 | Discharge: 2024-01-31 | Payer: Medicare (Managed Care) | Attending: Gastroenterology | Primary: Gastroenterology

## 2024-01-30 DIAGNOSIS — K50012 Crohn's disease of small intestine with intestinal obstruction: Principal | ICD-10-CM

## 2024-01-30 MED ORDER — DICYCLOMINE 10 MG CAPSULE
ORAL_CAPSULE | Freq: Four times a day (QID) | ORAL | 11 refills | 30.00000 days | Status: CP
Start: 2024-01-30 — End: 2025-01-29

## 2024-02-06 ENCOUNTER — Ambulatory Visit: Admit: 2024-02-06 | Discharge: 2024-02-07

## 2024-02-06 NOTE — Progress Notes (Signed)
 Patient here for Tdap per doctor's order. Immunization given in left deltoid. Patient tolerated well. Patient given VIS information sheet. Patient discharged home to self care.
